# Patient Record
Sex: Female | Born: 1945
Health system: Southern US, Community
[De-identification: ages and names within clinical notes are randomized; demographics above are authoritative.]

## PROBLEM LIST (undated history)

## (undated) DIAGNOSIS — J189 Pneumonia, unspecified organism: Secondary | ICD-10-CM

## (undated) DIAGNOSIS — M1990 Unspecified osteoarthritis, unspecified site: Secondary | ICD-10-CM

## (undated) DIAGNOSIS — F419 Anxiety disorder, unspecified: Secondary | ICD-10-CM

## (undated) DIAGNOSIS — I10 Essential (primary) hypertension: Secondary | ICD-10-CM

## (undated) DIAGNOSIS — S72002A Fracture of unspecified part of neck of left femur, initial encounter for closed fracture: Secondary | ICD-10-CM

## (undated) DIAGNOSIS — I2510 Atherosclerotic heart disease of native coronary artery without angina pectoris: Secondary | ICD-10-CM

## (undated) DIAGNOSIS — E785 Hyperlipidemia, unspecified: Secondary | ICD-10-CM

## (undated) HISTORY — PX: KNEE SURGERY: SHX244

## (undated) HISTORY — DX: Hyperlipidemia, unspecified: E78.5

## (undated) HISTORY — PX: CHOLECYSTECTOMY: SHX55

## (undated) HISTORY — PX: CARDIAC CATHETERIZATION: SHX172

## (undated) HISTORY — PX: EYE SURGERY: SHX253

## (undated) HISTORY — PX: CATARACT EXTRACTION, BILATERAL: SHX1313

## (undated) HISTORY — DX: Essential (primary) hypertension: I10

## (undated) HISTORY — PX: APPENDECTOMY: SHX54

---

## 2018-08-15 ENCOUNTER — Other Ambulatory Visit: Payer: Self-pay

## 2018-08-15 ENCOUNTER — Encounter (HOSPITAL_COMMUNITY)
Admission: EM | Disposition: A | Discharge status: Home Health | Payer: Self-pay | Source: Home / Self Care | Attending: Internal Medicine

## 2018-08-15 ENCOUNTER — Encounter (HOSPITAL_COMMUNITY): Payer: Self-pay | Admitting: Internal Medicine

## 2018-08-15 ENCOUNTER — Inpatient Hospital Stay (HOSPITAL_COMMUNITY): Payer: Medicare Other

## 2018-08-15 ENCOUNTER — Inpatient Hospital Stay (HOSPITAL_COMMUNITY): Payer: Medicare Other | Admitting: Anesthesiology

## 2018-08-15 ENCOUNTER — Emergency Department (HOSPITAL_COMMUNITY): Payer: Medicare Other

## 2018-08-15 ENCOUNTER — Other Ambulatory Visit (INDEPENDENT_AMBULATORY_CARE_PROVIDER_SITE_OTHER): Payer: Self-pay | Admitting: Physician Assistant

## 2018-08-15 ENCOUNTER — Inpatient Hospital Stay (HOSPITAL_COMMUNITY)
Admission: EM | Admit: 2018-08-15 | Discharge: 2018-08-17 | DRG: 470 | Disposition: A | Discharge status: Home Health | Payer: Medicare Other | Attending: Internal Medicine | Admitting: Internal Medicine

## 2018-08-15 DIAGNOSIS — E669 Obesity, unspecified: Secondary | ICD-10-CM | POA: Diagnosis present

## 2018-08-15 DIAGNOSIS — M25552 Pain in left hip: Secondary | ICD-10-CM | POA: Diagnosis present

## 2018-08-15 DIAGNOSIS — S72002A Fracture of unspecified part of neck of left femur, initial encounter for closed fracture: Secondary | ICD-10-CM

## 2018-08-15 DIAGNOSIS — Z9841 Cataract extraction status, right eye: Secondary | ICD-10-CM | POA: Diagnosis not present

## 2018-08-15 DIAGNOSIS — R030 Elevated blood-pressure reading, without diagnosis of hypertension: Secondary | ICD-10-CM | POA: Diagnosis not present

## 2018-08-15 DIAGNOSIS — S72042A Displaced fracture of base of neck of left femur, initial encounter for closed fracture: Secondary | ICD-10-CM

## 2018-08-15 DIAGNOSIS — F419 Anxiety disorder, unspecified: Secondary | ICD-10-CM | POA: Diagnosis present

## 2018-08-15 DIAGNOSIS — Z8249 Family history of ischemic heart disease and other diseases of the circulatory system: Secondary | ICD-10-CM | POA: Diagnosis not present

## 2018-08-15 DIAGNOSIS — W101XXA Fall (on)(from) sidewalk curb, initial encounter: Secondary | ICD-10-CM | POA: Diagnosis present

## 2018-08-15 DIAGNOSIS — Z87891 Personal history of nicotine dependence: Secondary | ICD-10-CM | POA: Diagnosis not present

## 2018-08-15 DIAGNOSIS — Z9842 Cataract extraction status, left eye: Secondary | ICD-10-CM | POA: Diagnosis not present

## 2018-08-15 DIAGNOSIS — I10 Essential (primary) hypertension: Secondary | ICD-10-CM | POA: Diagnosis not present

## 2018-08-15 DIAGNOSIS — Z96642 Presence of left artificial hip joint: Secondary | ICD-10-CM

## 2018-08-15 DIAGNOSIS — I2510 Atherosclerotic heart disease of native coronary artery without angina pectoris: Secondary | ICD-10-CM | POA: Diagnosis not present

## 2018-08-15 DIAGNOSIS — Z6833 Body mass index (BMI) 33.0-33.9, adult: Secondary | ICD-10-CM | POA: Diagnosis not present

## 2018-08-15 DIAGNOSIS — S72009A Fracture of unspecified part of neck of unspecified femur, initial encounter for closed fracture: Secondary | ICD-10-CM | POA: Diagnosis present

## 2018-08-15 DIAGNOSIS — Z419 Encounter for procedure for purposes other than remedying health state, unspecified: Secondary | ICD-10-CM

## 2018-08-15 HISTORY — DX: Fracture of unspecified part of neck of left femur, initial encounter for closed fracture: S72.002A

## 2018-08-15 HISTORY — PX: TOTAL HIP ARTHROPLASTY: SHX124

## 2018-08-15 LAB — TYPE AND SCREEN
ABO/RH(D): O POS
ANTIBODY SCREEN: NEGATIVE

## 2018-08-15 LAB — LIPID PANEL
Cholesterol: 221 mg/dL — ABNORMAL HIGH (ref 0–200)
HDL: 55 mg/dL (ref >40)
LDL Cholesterol: 154 mg/dL — ABNORMAL HIGH (ref 0–99)
Total CHOL/HDL Ratio: 4 RATIO
Triglycerides: 59 mg/dL (ref <150)
VLDL: 12 mg/dL (ref 0–40)

## 2018-08-15 LAB — CBC
HCT: 42 % (ref 36.0–46.0)
Hemoglobin: 13.6 g/dL (ref 12.0–15.0)
MCH: 30 pg (ref 26.0–34.0)
MCHC: 32.4 g/dL (ref 30.0–36.0)
MCV: 92.5 fL (ref 80.0–100.0)
Platelets: 249 10*3/uL (ref 150–400)
RBC: 4.54 MIL/uL (ref 3.87–5.11)
RDW: 13.2 % (ref 11.5–15.5)
WBC: 15.2 10*3/uL — AB (ref 4.0–10.5)
nRBC: 0 % (ref 0.0–0.2)

## 2018-08-15 LAB — SURGICAL PCR SCREEN
MRSA, PCR: NEGATIVE
Staphylococcus aureus: NEGATIVE

## 2018-08-15 LAB — CBC WITH DIFFERENTIAL/PLATELET
Abs Immature Granulocytes: 0.08 10*3/uL — ABNORMAL HIGH (ref 0.00–0.07)
Basophils Absolute: 0.1 10*3/uL (ref 0.0–0.1)
Basophils Relative: 1 %
Eosinophils Absolute: 0.1 10*3/uL (ref 0.0–0.5)
Eosinophils Relative: 1 %
HCT: 43 % (ref 36.0–46.0)
Hemoglobin: 14.1 g/dL (ref 12.0–15.0)
Immature Granulocytes: 1 %
Lymphocytes Relative: 15 %
Lymphs Abs: 1.6 10*3/uL (ref 0.7–4.0)
MCH: 30.5 pg (ref 26.0–34.0)
MCHC: 32.8 g/dL (ref 30.0–36.0)
MCV: 92.9 fL (ref 80.0–100.0)
Monocytes Absolute: 0.6 10*3/uL (ref 0.1–1.0)
Monocytes Relative: 6 %
NEUTROS PCT: 76 %
Neutro Abs: 8.3 10*3/uL — ABNORMAL HIGH (ref 1.7–7.7)
Platelets: 247 10*3/uL (ref 150–400)
RBC: 4.63 MIL/uL (ref 3.87–5.11)
RDW: 13.2 % (ref 11.5–15.5)
WBC: 10.7 10*3/uL — ABNORMAL HIGH (ref 4.0–10.5)
nRBC: 0 % (ref 0.0–0.2)

## 2018-08-15 LAB — BASIC METABOLIC PANEL
Anion gap: 10 (ref 5–15)
BUN: 22 mg/dL (ref 8–23)
CO2: 26 mmol/L (ref 22–32)
Calcium: 9.2 mg/dL (ref 8.9–10.3)
Chloride: 104 mmol/L (ref 98–111)
Creatinine, Ser: 0.79 mg/dL (ref 0.44–1.00)
GFR calc Af Amer: >60 mL/min (ref >60)
GFR calc non Af Amer: >60 mL/min (ref >60)
Glucose, Bld: 114 mg/dL — ABNORMAL HIGH (ref 70–99)
Potassium: 4.1 mmol/L (ref 3.5–5.1)
Sodium: 140 mmol/L (ref 135–145)

## 2018-08-15 LAB — GLUCOSE, CAPILLARY: Glucose-Capillary: 124 mg/dL — ABNORMAL HIGH (ref 70–99)

## 2018-08-15 LAB — HEMOGLOBIN A1C
Hgb A1c MFr Bld: 5.1 % (ref 4.8–5.6)
MEAN PLASMA GLUCOSE: 99.67 mg/dL

## 2018-08-15 LAB — TSH: TSH: 0.472 u[IU]/mL (ref 0.350–4.500)

## 2018-08-15 LAB — CREATININE, SERUM
Creatinine, Ser: 0.91 mg/dL (ref 0.44–1.00)
GFR calc non Af Amer: >60 mL/min (ref >60)

## 2018-08-15 SURGERY — ARTHROPLASTY, HIP, TOTAL, ANTERIOR APPROACH
Anesthesia: General | Site: Hip | Laterality: Left

## 2018-08-15 MED ORDER — PROPOFOL 10 MG/ML IV BOLUS
INTRAVENOUS | Status: AC
  Filled 2018-08-15: qty 20

## 2018-08-15 MED ORDER — HYDROCODONE-ACETAMINOPHEN 7.5-325 MG PO TABS: 1.0000 | ORAL_TABLET | ORAL | Status: DC | PRN

## 2018-08-15 MED ORDER — ONDANSETRON HCL 4 MG/2ML IJ SOLN: 4.0000 mg | Freq: Four times a day (QID) | INTRAMUSCULAR | Status: DC | PRN

## 2018-08-15 MED ORDER — KETAMINE HCL 10 MG/ML IJ SOLN
INTRAMUSCULAR | Status: AC
  Filled 2018-08-15: qty 1

## 2018-08-15 MED ORDER — BUPIVACAINE IN DEXTROSE 0.75-8.25 % IT SOLN
INTRATHECAL | Status: DC | PRN
  Administered 2018-08-15: 1.6 mL via INTRATHECAL

## 2018-08-15 MED ORDER — METHOCARBAMOL 500 MG IVPB - SIMPLE MED
500.0000 mg | Freq: Four times a day (QID) | INTRAVENOUS | Status: DC | PRN
  Filled 2018-08-15: qty 50

## 2018-08-15 MED ORDER — PHENOL 1.4 % MT LIQD: 1.0000 | OROMUCOSAL | Status: DC | PRN

## 2018-08-15 MED ORDER — SODIUM CHLORIDE 0.9 % IR SOLN
Status: DC | PRN
  Administered 2018-08-15: 1000 mL

## 2018-08-15 MED ORDER — MORPHINE SULFATE (PF) 4 MG/ML IV SOLN: 0.5000 mg | INTRAVENOUS | Status: DC | PRN

## 2018-08-15 MED ORDER — SODIUM CHLORIDE 0.9 % IV SOLN
INTRAVENOUS | Status: DC
  Administered 2018-08-15 (×2): via INTRAVENOUS

## 2018-08-15 MED ORDER — DEXAMETHASONE SODIUM PHOSPHATE 10 MG/ML IJ SOLN
INTRAMUSCULAR | Status: DC | PRN
  Administered 2018-08-15: 10 mg via INTRAVENOUS

## 2018-08-15 MED ORDER — ONDANSETRON HCL 4 MG/2ML IJ SOLN
4.0000 mg | Freq: Four times a day (QID) | INTRAMUSCULAR | Status: DC | PRN
  Administered 2018-08-15: 4 mg via INTRAVENOUS
  Filled 2018-08-15: qty 2

## 2018-08-15 MED ORDER — SUGAMMADEX SODIUM 200 MG/2ML IV SOLN
INTRAVENOUS | Status: AC
  Filled 2018-08-15: qty 2

## 2018-08-15 MED ORDER — DEXAMETHASONE SODIUM PHOSPHATE 10 MG/ML IJ SOLN
INTRAMUSCULAR | Status: AC
  Filled 2018-08-15: qty 1

## 2018-08-15 MED ORDER — CHLORHEXIDINE GLUCONATE 4 % EX LIQD: 60.0000 mL | Freq: Once | CUTANEOUS | Status: DC

## 2018-08-15 MED ORDER — ONDANSETRON HCL 4 MG/2ML IJ SOLN
INTRAMUSCULAR | Status: AC
  Filled 2018-08-15: qty 2

## 2018-08-15 MED ORDER — DOCUSATE SODIUM 100 MG PO CAPS
100.0000 mg | ORAL_CAPSULE | Freq: Two times a day (BID) | ORAL | Status: DC
  Administered 2018-08-16 – 2018-08-17 (×3): 100 mg via ORAL
  Filled 2018-08-15 (×3): qty 1

## 2018-08-15 MED ORDER — OXYCODONE HCL 5 MG PO TABS: 5.0000 mg | ORAL_TABLET | Freq: Once | ORAL | Status: DC | PRN

## 2018-08-15 MED ORDER — METOCLOPRAMIDE HCL 5 MG/ML IJ SOLN: 5.0000 mg | Freq: Three times a day (TID) | INTRAMUSCULAR | Status: DC | PRN

## 2018-08-15 MED ORDER — MORPHINE SULFATE (PF) 4 MG/ML IV SOLN
4.0000 mg | Freq: Once | INTRAVENOUS | Status: AC
  Administered 2018-08-15: 4 mg via INTRAVENOUS
  Filled 2018-08-15: qty 1

## 2018-08-15 MED ORDER — NITROGLYCERIN 2 % TD OINT
0.5000 [in_us] | TOPICAL_OINTMENT | Freq: Once | TRANSDERMAL | Status: AC
  Administered 2018-08-15: 0.5 [in_us] via TOPICAL
  Filled 2018-08-15: qty 1

## 2018-08-15 MED ORDER — TRANEXAMIC ACID-NACL 1000-0.7 MG/100ML-% IV SOLN
1000.0000 mg | INTRAVENOUS | Status: AC
  Administered 2018-08-15: 1000 mg via INTRAVENOUS
  Filled 2018-08-15: qty 100

## 2018-08-15 MED ORDER — ONDANSETRON HCL 4 MG/2ML IJ SOLN: 4.0000 mg | Freq: Once | INTRAMUSCULAR | Status: DC | PRN

## 2018-08-15 MED ORDER — PANTOPRAZOLE SODIUM 40 MG PO TBEC
40.0000 mg | DELAYED_RELEASE_TABLET | Freq: Every day | ORAL | Status: DC
  Administered 2018-08-15 – 2018-08-17 (×3): 40 mg via ORAL
  Filled 2018-08-15 (×3): qty 1

## 2018-08-15 MED ORDER — EPHEDRINE 5 MG/ML INJ
INTRAVENOUS | Status: AC
  Filled 2018-08-15: qty 10

## 2018-08-15 MED ORDER — MIDAZOLAM HCL 2 MG/2ML IJ SOLN
INTRAMUSCULAR | Status: DC | PRN
  Administered 2018-08-15: 1 mg via INTRAVENOUS

## 2018-08-15 MED ORDER — HYDRALAZINE HCL 20 MG/ML IJ SOLN
10.0000 mg | Freq: Four times a day (QID) | INTRAMUSCULAR | Status: DC | PRN
  Administered 2018-08-15: 10 mg via INTRAVENOUS
  Filled 2018-08-15: qty 1

## 2018-08-15 MED ORDER — DOCUSATE SODIUM 100 MG PO CAPS: 100.0000 mg | ORAL_CAPSULE | Freq: Two times a day (BID) | ORAL | Status: DC

## 2018-08-15 MED ORDER — MENTHOL 3 MG MT LOZG: 1.0000 | LOZENGE | OROMUCOSAL | Status: DC | PRN

## 2018-08-15 MED ORDER — KETAMINE HCL 10 MG/ML IJ SOLN
INTRAMUSCULAR | Status: DC | PRN
  Administered 2018-08-15: 20 mg via INTRAVENOUS
  Administered 2018-08-15: 30 mg via INTRAVENOUS
  Administered 2018-08-15: 20 mg via INTRAVENOUS

## 2018-08-15 MED ORDER — METHOCARBAMOL 500 MG PO TABS
500.0000 mg | ORAL_TABLET | Freq: Four times a day (QID) | ORAL | Status: DC | PRN
  Administered 2018-08-16 – 2018-08-17 (×3): 500 mg via ORAL
  Filled 2018-08-15 (×3): qty 1

## 2018-08-15 MED ORDER — CEFAZOLIN SODIUM-DEXTROSE 2-4 GM/100ML-% IV SOLN
2.0000 g | INTRAVENOUS | Status: AC
  Administered 2018-08-15: 2 g via INTRAVENOUS
  Filled 2018-08-15: qty 100

## 2018-08-15 MED ORDER — FENTANYL CITRATE (PF) 250 MCG/5ML IJ SOLN
INTRAMUSCULAR | Status: AC
  Filled 2018-08-15: qty 5

## 2018-08-15 MED ORDER — PROPOFOL 500 MG/50ML IV EMUL
INTRAVENOUS | Status: DC | PRN
  Administered 2018-08-15: 75 ug/kg/min via INTRAVENOUS

## 2018-08-15 MED ORDER — SODIUM CHLORIDE 0.9 % IV SOLN
INTRAVENOUS | Status: DC
  Administered 2018-08-15 – 2018-08-16 (×2): via INTRAVENOUS

## 2018-08-15 MED ORDER — POVIDONE-IODINE 10 % EX SWAB
2.0000 "application " | Freq: Once | CUTANEOUS | Status: AC
  Administered 2018-08-15: 2 via TOPICAL

## 2018-08-15 MED ORDER — PHENYLEPHRINE 40 MCG/ML (10ML) SYRINGE FOR IV PUSH (FOR BLOOD PRESSURE SUPPORT)
PREFILLED_SYRINGE | INTRAVENOUS | Status: AC
  Filled 2018-08-15: qty 10

## 2018-08-15 MED ORDER — FENTANYL CITRATE (PF) 250 MCG/5ML IJ SOLN
INTRAMUSCULAR | Status: DC | PRN
  Administered 2018-08-15 (×3): 50 ug via INTRAVENOUS

## 2018-08-15 MED ORDER — ROCURONIUM BROMIDE 100 MG/10ML IV SOLN
INTRAVENOUS | Status: AC
  Filled 2018-08-15: qty 1

## 2018-08-15 MED ORDER — ALBUTEROL SULFATE HFA 108 (90 BASE) MCG/ACT IN AERS
INHALATION_SPRAY | RESPIRATORY_TRACT | Status: AC
  Filled 2018-08-15: qty 6.7

## 2018-08-15 MED ORDER — FENTANYL CITRATE (PF) 100 MCG/2ML IJ SOLN: 25.0000 ug | INTRAMUSCULAR | Status: DC | PRN

## 2018-08-15 MED ORDER — ONDANSETRON HCL 4 MG PO TABS: 4.0000 mg | ORAL_TABLET | Freq: Four times a day (QID) | ORAL | Status: DC | PRN

## 2018-08-15 MED ORDER — PHENYLEPHRINE 40 MCG/ML (10ML) SYRINGE FOR IV PUSH (FOR BLOOD PRESSURE SUPPORT)
PREFILLED_SYRINGE | INTRAVENOUS | Status: DC | PRN
  Administered 2018-08-15 (×4): 80 ug via INTRAVENOUS

## 2018-08-15 MED ORDER — ACETAMINOPHEN 325 MG PO TABS
650.0000 mg | ORAL_TABLET | Freq: Four times a day (QID) | ORAL | Status: DC | PRN
  Administered 2018-08-16 – 2018-08-17 (×3): 650 mg via ORAL
  Filled 2018-08-15 (×3): qty 2

## 2018-08-15 MED ORDER — DIPHENHYDRAMINE HCL 12.5 MG/5ML PO ELIX
12.5000 mg | ORAL_SOLUTION | ORAL | Status: DC | PRN
  Administered 2018-08-16: 25 mg via ORAL
  Filled 2018-08-15: qty 10

## 2018-08-15 MED ORDER — OXYCODONE HCL 5 MG/5ML PO SOLN
5.0000 mg | Freq: Once | ORAL | Status: DC | PRN
  Filled 2018-08-15: qty 5

## 2018-08-15 MED ORDER — CEFAZOLIN SODIUM-DEXTROSE 1-4 GM/50ML-% IV SOLN
1.0000 g | Freq: Four times a day (QID) | INTRAVENOUS | Status: AC
  Administered 2018-08-15 – 2018-08-16 (×2): 1 g via INTRAVENOUS
  Filled 2018-08-15 (×2): qty 50

## 2018-08-15 MED ORDER — HYDRALAZINE HCL 20 MG/ML IJ SOLN: 10.0000 mg | Freq: Four times a day (QID) | INTRAMUSCULAR | Status: DC | PRN

## 2018-08-15 MED ORDER — ACETAMINOPHEN 325 MG PO TABS: 325.0000 mg | ORAL_TABLET | Freq: Four times a day (QID) | ORAL | Status: DC | PRN

## 2018-08-15 MED ORDER — STERILE WATER FOR IRRIGATION IR SOLN
Status: DC | PRN
  Administered 2018-08-15: 2000 mL

## 2018-08-15 MED ORDER — ALUM & MAG HYDROXIDE-SIMETH 200-200-20 MG/5ML PO SUSP: 30.0000 mL | ORAL | Status: DC | PRN

## 2018-08-15 MED ORDER — LIDOCAINE 2% (20 MG/ML) 5 ML SYRINGE
INTRAMUSCULAR | Status: AC
  Filled 2018-08-15: qty 5

## 2018-08-15 MED ORDER — ACETAMINOPHEN 650 MG RE SUPP: 650.0000 mg | Freq: Four times a day (QID) | RECTAL | Status: DC | PRN

## 2018-08-15 MED ORDER — METOCLOPRAMIDE HCL 5 MG PO TABS: 5.0000 mg | ORAL_TABLET | Freq: Three times a day (TID) | ORAL | Status: DC | PRN

## 2018-08-15 MED ORDER — ONDANSETRON HCL 4 MG/2ML IJ SOLN
4.0000 mg | Freq: Once | INTRAMUSCULAR | Status: AC
  Administered 2018-08-15: 4 mg via INTRAVENOUS
  Filled 2018-08-15: qty 2

## 2018-08-15 MED ORDER — POLYETHYLENE GLYCOL 3350 17 G PO PACK: 17.0000 g | PACK | Freq: Every day | ORAL | Status: DC | PRN

## 2018-08-15 MED ORDER — ONDANSETRON HCL 4 MG/2ML IJ SOLN
INTRAMUSCULAR | Status: DC | PRN
  Administered 2018-08-15: 4 mg via INTRAVENOUS

## 2018-08-15 MED ORDER — SODIUM CHLORIDE 0.9 % IV SOLN: INTRAVENOUS | Status: DC

## 2018-08-15 MED ORDER — EPHEDRINE SULFATE-NACL 50-0.9 MG/10ML-% IV SOSY
PREFILLED_SYRINGE | INTRAVENOUS | Status: DC | PRN
  Administered 2018-08-15: 10 mg via INTRAVENOUS

## 2018-08-15 MED ORDER — HYDROCODONE-ACETAMINOPHEN 5-325 MG PO TABS
1.0000 | ORAL_TABLET | ORAL | Status: DC | PRN
  Administered 2018-08-15 – 2018-08-16 (×2): 2 via ORAL
  Administered 2018-08-16: 1 via ORAL
  Filled 2018-08-15: qty 2
  Filled 2018-08-15: qty 1
  Filled 2018-08-15: qty 2

## 2018-08-15 MED ORDER — ASPIRIN 81 MG PO CHEW
81.0000 mg | CHEWABLE_TABLET | Freq: Two times a day (BID) | ORAL | Status: DC
  Administered 2018-08-15 – 2018-08-17 (×4): 81 mg via ORAL
  Filled 2018-08-15 (×4): qty 1

## 2018-08-15 MED ORDER — MIDAZOLAM HCL 2 MG/2ML IJ SOLN
INTRAMUSCULAR | Status: AC
  Filled 2018-08-15: qty 2

## 2018-08-15 MED ORDER — LIP MEDEX EX OINT
TOPICAL_OINTMENT | CUTANEOUS | Status: AC
  Administered 2018-08-15: 15:00:00
  Filled 2018-08-15: qty 7

## 2018-08-15 SURGICAL SUPPLY — 42 items
BAG ZIPLOCK 12X15 (MISCELLANEOUS) IMPLANT
BLADE SAW SGTL 18X1.27X75 (BLADE) ×2 IMPLANT
BLADE SURG SZ10 CARB STEEL (BLADE) ×4 IMPLANT
COVER PERINEAL POST (MISCELLANEOUS) ×2 IMPLANT
COVER SURGICAL LIGHT HANDLE (MISCELLANEOUS) ×2 IMPLANT
COVER WAND RF STERILE (DRAPES) IMPLANT
DRAPE STERI IOBAN 125X83 (DRAPES) ×2 IMPLANT
DRAPE U-SHAPE 47X51 STRL (DRAPES) ×4 IMPLANT
DRSG AQUACEL AG ADV 3.5X10 (GAUZE/BANDAGES/DRESSINGS) ×2 IMPLANT
DURAPREP 26ML APPLICATOR (WOUND CARE) ×2 IMPLANT
ELECT REM PT RETURN 15FT ADLT (MISCELLANEOUS) ×2 IMPLANT
GAUZE XEROFORM 1X8 LF (GAUZE/BANDAGES/DRESSINGS) ×1 IMPLANT
GLOVE BIO SURGEON STRL SZ7.5 (GLOVE) ×2 IMPLANT
GLOVE BIOGEL PI IND STRL 7.0 (GLOVE) IMPLANT
GLOVE BIOGEL PI IND STRL 7.5 (GLOVE) IMPLANT
GLOVE BIOGEL PI IND STRL 8 (GLOVE) ×2 IMPLANT
GLOVE BIOGEL PI IND STRL 9 (GLOVE) IMPLANT
GLOVE BIOGEL PI INDICATOR 7.0 (GLOVE) ×2
GLOVE BIOGEL PI INDICATOR 7.5 (GLOVE) ×1
GLOVE BIOGEL PI INDICATOR 8 (GLOVE) ×2
GLOVE BIOGEL PI INDICATOR 9 (GLOVE) ×1
GLOVE ECLIPSE 8.0 STRL XLNG CF (GLOVE) ×3 IMPLANT
GLOVE SURG ORTHO 9.0 STRL STRW (GLOVE) ×1 IMPLANT
GOWN SPEC L3 XXLG W/TWL (GOWN DISPOSABLE) ×1 IMPLANT
GOWN STRL REUS W/TWL XL LVL3 (GOWN DISPOSABLE) ×5 IMPLANT
HANDPIECE INTERPULSE COAX TIP (DISPOSABLE) ×1
HEAD M SROM 36MM 2 (Hips) IMPLANT
HOLDER FOLEY CATH W/STRAP (MISCELLANEOUS) ×2 IMPLANT
LINER ACETAB NEUTRAL 36ID 520D (Liner) ×1 IMPLANT
PACK ANTERIOR HIP CUSTOM (KITS) ×2 IMPLANT
PIN SECTOR W/GRIP ACE CUP 52MM (Hips) ×1 IMPLANT
SET HNDPC FAN SPRY TIP SCT (DISPOSABLE) ×1 IMPLANT
SROM M HEAD 36MM 2 (Hips) ×2 IMPLANT
STAPLER VISISTAT 35W (STAPLE) ×1 IMPLANT
STEM CORAIL KA12 (Stem) ×1 IMPLANT
SUT ETHIBOND NAB CT1 #1 30IN (SUTURE) ×2 IMPLANT
SUT VIC AB 0 CT1 36 (SUTURE) ×2 IMPLANT
SUT VIC AB 1 CT1 36 (SUTURE) ×2 IMPLANT
SUT VIC AB 2-0 CT1 27 (SUTURE) ×2
SUT VIC AB 2-0 CT1 TAPERPNT 27 (SUTURE) ×2 IMPLANT
TRAY FOLEY MTR SLVR 14FR STAT (SET/KITS/TRAYS/PACK) ×1 IMPLANT
YANKAUER SUCT BULB TIP 10FT TU (MISCELLANEOUS) ×2 IMPLANT

## 2018-08-15 NOTE — Anesthesia Procedure Notes (Signed)
Spinal  Patient location during procedure: OR Start time: 08/15/2018 4:00 PM End time: 08/15/2018 4:08 PM Staffing Anesthesiologist: Beryle LatheBrock, Thomas E, MD Performed: anesthesiologist  Preanesthetic Checklist Completed: patient identified, surgical consent, pre-op evaluation, timeout performed, IV checked, risks and benefits discussed and monitors and equipment checked Spinal Block Patient position: left lateral decubitus Prep: DuraPrep Patient monitoring: heart rate, cardiac monitor, continuous pulse ox and blood pressure Approach: midline Location: L3-4 Injection technique: single-shot Needle Needle type: Whitacre  Needle gauge: 22 G Additional Notes Consent was obtained prior to the procedure with all questions answered and concerns addressed. Risks including, but not limited to, bleeding, infection, nerve damage, paralysis, failed block, inadequate analgesia, allergic reaction, high spinal, itching, and headache were discussed and the patient wished to proceed. Functioning IV was confirmed and monitors were applied. Sterile prep and drape, including hand hygiene, mask, and sterile gloves were used. The patient was positioned and the spine was prepped. The skin was anesthetized with lidocaine. Free flow of clear CSF was obtained prior to injecting local anesthetic into the CSF. The spinal needle aspirated freely following injection. The needle was carefully withdrawn. Patient remained in left lateral decubitus position for 3 minutes following the block to encourage unilateral block of the affected hip. The patient tolerated the procedure well.   Leslye Peerhomas Brock, MD

## 2018-08-15 NOTE — Consult Note (Signed)
Reason for Consult:  Left hip femoral neck fracture Referring Physician:   Wonda Cardenas ED EDP  BENA QUIRAM is an 73 y.o. female.  HPI:   73 yo female with no past history of hip patient who sustained an accidental mechanical fall when she tripped over a curb today.  She was brought to the ED with significant left hip pain and the inability to ambulate.  She was found to have a left hip fracture.  Ortho is consulted for this.  She complains only of left hip pain.  She denies any syncopal episode, CP, SOB, F/C, N/V.  Past Medical History:  Diagnosis Date  . Closed fracture of neck of left femur (HCC) 08/15/2018    Past Surgical History:  Procedure Laterality Date  . CATARACT EXTRACTION, BILATERAL    . KNEE SURGERY      Family History  Problem Relation Age of Onset  . Cancer Sister   . Heart attack Brother     Social History:  reports that she has quit smoking. She has never used smokeless tobacco. She reports previous alcohol use. She reports that she does not use drugs.  Allergies: No Known Allergies  Medications: I have reviewed the patient's current medications.  Results for orders placed or performed during the hospital encounter of 08/15/18 (from the past 48 hour(s))  Basic metabolic panel     Status: Abnormal   Collection Time: 08/15/18 12:49 PM  Result Value Ref Range   Sodium 140 135 - 145 mmol/L   Potassium 4.1 3.5 - 5.1 mmol/L   Chloride 104 98 - 111 mmol/L   CO2 26 22 - 32 mmol/L   Glucose, Bld 114 (H) 70 - 99 mg/dL   BUN 22 8 - 23 mg/dL   Creatinine, Ser 4.25 0.44 - 1.00 mg/dL   Calcium 9.2 8.9 - 95.6 mg/dL   GFR calc non Af Amer >60 >60 mL/min   GFR calc Af Amer >60 >60 mL/min   Anion gap 10 5 - 15    Comment: Performed at [facility], [address] [place], [place] [address]  CBC with Differential     Status: Abnormal   Collection Time: 08/15/18 12:49 PM  Result Value Ref Range   WBC 10.7 (H) 4.0 - 10.5 K/uL   RBC 4.63 3.87 - 5.11  MIL/uL   Hemoglobin 14.1 12.0 - 15.0 g/dL   HCT 43.3 29.5 - 18.8 %   MCV 92.9 80.0 - 100.0 fL   MCH 30.5 26.0 - 34.0 pg   MCHC 32.8 30.0 - 36.0 g/dL   RDW 41.6 60.6 - 30.1 %   Platelets 247 150 - 400 K/uL   nRBC 0.0 0.0 - 0.2 %   Neutrophils Relative % 76 %   Neutro Abs 8.3 (H) 1.7 - 7.7 K/uL   Lymphocytes Relative 15 %   Lymphs Abs 1.6 0.7 - 4.0 K/uL   Monocytes Relative 6 %   Monocytes Absolute 0.6 0.1 - 1.0 K/uL   Eosinophils Relative 1 %   Eosinophils Absolute 0.1 0.0 - 0.5 K/uL   Basophils Relative 1 %   Basophils Absolute 0.1 0.0 - 0.1 K/uL   Immature Granulocytes 1 %   Abs Immature Granulocytes 0.08 (H) 0.00 - 0.07 K/uL    Comment: Performed at [facility], [address], [place], [place] [address]    Dg Hip Unilat With Pelvis 2-3 Views Left  Result Date: 08/15/2018 CLINICAL DATA:  Left hip pain after  fall today. EXAM: DG HIP (WITH OR WITHOUT PELVIS) 2-3V LEFT COMPARISON:  None. FINDINGS: Mildly displaced fracture is seen involving the proximal left femoral neck. Right hip appears normal. No significant degenerative changes noted. IMPRESSION: Mildly displaced proximal left femoral neck fracture. Electronically Signed   By: Lupita RaiderJames  Green Jr, M.D.   On: 08/15/2018 12:06    ROS Blood pressure 133/63, pulse 75, resp. rate 16, height 5\' 5"  (1.651 m), weight 81.6 kg, SpO2 98 %. Physical Exam  Assessment/Plan: Left hip femoral neck fracture  I have spoke to the patient and her husband in detail.  We have recommended a left hip replacement to treat this injury.  The reasoning behind surgery is discussed as well as the risks and benefits involved.  Informed consent is obtained.  Kathryne HitchChristopher Y Perl Folmar 08/15/2018, 3:46 PM

## 2018-08-15 NOTE — Anesthesia Procedure Notes (Signed)
Date/Time: 08/15/2018 3:10 PM Performed by: Florene Route, CRNA Oxygen Delivery Method: Nasal cannula

## 2018-08-15 NOTE — ED Provider Notes (Signed)
[facility]-EMERGENCY DEPT Provider Note   CSN: [id number] Arrival date & time: 08/15/18  1122     History   Chief Complaint Chief Complaint  Patient presents with  . Fall    HPI Debbie Cardenas is a 73 y.o. female.  The history is provided by the patient. No language interpreter was used.  Fall    Debbie Cardenas is a 73 y.o. female who presents to the Emergency Department complaining of fall.  She presents to the ED for evaluation of injuries following a fall that happened about 11 am.  She was walking to get in her car and tripped over a curb, falling and landing on her left hip.  No head injury or LOC.  She is unable to bear weight.  She denies any recent illnesses, takes no medications and has no known medical problems.  She has not been to a doctor in over 40 years.  She has severe pain to the left hip, no additional complaints.   Past Medical History:  Diagnosis Date  . Closed fracture of neck of left femur (HCC) 08/15/2018    Patient Active Problem List   Diagnosis Date Noted  . Hip fracture (HCC) 08/15/2018  . Closed fracture of neck of left femur (HCC) 08/15/2018       OB History   No obstetric history on file.      Home Medications    Prior to Admission medications   Medication Sig Start Date End Date Taking? Authorizing Provider  acetaminophen (TYLENOL) 325 MG tablet Take 650 mg by mouth every 6 (six) hours as needed for mild pain or headache.   Yes [provider]  OVER THE COUNTER MEDICATION Take 1 tablet by mouth daily. OTC med to help with bowel movements. Says its not a stool softer or probiotic.   Yes [provider]    Family History Family History  Problem Relation Age of Onset  . Cancer Sister   . Heart attack Brother     Social History Social History   Tobacco Use  . Smoking status: Former Games developer  . Smokeless tobacco: Never Used  Substance Use Topics  . Alcohol use: Not Currently  . Drug  use: Never     Allergies   Patient has no known allergies.   Review of Systems Review of Systems  All other systems reviewed and are negative.    Physical Exam Updated Vital Signs BP 133/63   Pulse 75   Resp 16   Ht 5\' 5"  (1.651 m)   Wt 81.6 kg Comment: newly diagnosed broken hip on ER stretcher  SpO2 98%   BMI 29.95 kg/m   Physical Exam Vitals signs and nursing note reviewed.  Constitutional:      Appearance: She is well-developed.  HENT:     Head: Normocephalic and atraumatic.  Cardiovascular:     Rate and Rhythm: Normal rate and regular rhythm.     Heart sounds: No murmur.  Pulmonary:     Effort: Pulmonary effort is normal. No respiratory distress.     Breath sounds: Normal breath sounds.  Abdominal:     Palpations: Abdomen is soft.     Tenderness: There is no abdominal tenderness. There is no guarding or rebound.  Musculoskeletal:        General: No swelling.     Comments: 2+ DP pulses bilaterally. TTP over left lateral hip.  LLE is externally rotated and shortened.    Skin:  General: Skin is warm and dry.  Neurological:     Mental Status: She is alert and oriented to person, place, and time.  Psychiatric:        Mood and Affect: Mood normal.        Behavior: Behavior normal.      ED Treatments / Results  Labs (all labs ordered are listed, but only abnormal results are displayed) Labs Reviewed  BASIC METABOLIC PANEL - Abnormal; Notable for the following components:      Result Value   Glucose, Bld 114 (*)    All other components within normal limits  CBC WITH DIFFERENTIAL/PLATELET - Abnormal; Notable for the following components:   WBC 10.7 (*)    Neutro Abs 8.3 (*)    Abs Immature Granulocytes 0.08 (*)    All other components within normal limits  SURGICAL PCR SCREEN  HEMOGLOBIN A1C  TSH  LIPID PANEL  TYPE AND SCREEN    EKG None  Radiology Dg Hip Unilat With Pelvis 2-3 Views Left  Result Date: 08/15/2018 CLINICAL DATA:  Left hip  pain after fall today. EXAM: DG HIP (WITH OR WITHOUT PELVIS) 2-3V LEFT COMPARISON:  None. FINDINGS: Mildly displaced fracture is seen involving the proximal left femoral neck. Right hip appears normal. No significant degenerative changes noted. IMPRESSION: Mildly displaced proximal left femoral neck fracture. Electronically Signed   By: Lupita Raider, M.D.   On: 08/15/2018 12:06    Procedures Procedures (including critical care time)  Medications Ordered in ED Medications  hydrALAZINE (APRESOLINE) injection 10 mg ( Intravenous MAR Hold 08/15/18 1448)  0.9 %  sodium chloride infusion ( Intravenous New Bag/Given 08/15/18 1459)  ceFAZolin (ANCEF) IVPB 2g/100 mL premix (has no administration in time range)  chlorhexidine (HIBICLENS) 4 % liquid 4 application (has no administration in time range)  tranexamic acid (CYKLOKAPRON) IVPB 1,000 mg (has no administration in time range)  ondansetron (ZOFRAN) injection 4 mg (4 mg Intravenous Given 08/15/18 1248)  morphine 4 MG/ML injection 4 mg (4 mg Intravenous Given 08/15/18 1248)  nitroGLYCERIN (NITROGLYN) 2 % ointment 0.5 inch (0.5 inches Topical Given 08/15/18 1355)  povidone-iodine 10 % swab 2 application (2 application Topical Given 08/15/18 1457)  lip balm (CARMEX) ointment (  Given 08/15/18 1517)     Initial Impression / Assessment and Plan / ED Course  I have reviewed the triage vital signs and the nursing notes.  Pertinent labs & imaging results that were available during my care of the patient were reviewed by me and considered in my medical decision making (see chart for details).    Patient here for evaluation of left hip pain following following a fall. She has mildly displaced left femoral neck fracture. Discussed with Dr. Magnus Ivan with orthopedics, who will see the patient and consult. Medicine consulted for admission and medical clearance. Patient updated of findings of studies recommendation for admission and she is in agreement with  treatment plan.  Final Clinical Impressions(s) / ED Diagnoses   Final diagnoses:  Surgery, elective    ED Discharge Orders    None       Tilden Fossa, MD 08/15/18 1553

## 2018-08-15 NOTE — Brief Op Note (Signed)
08/15/2018  5:24 PM  PATIENT:  Debbie Cardenas  73 y.o. female  PRE-OPERATIVE DIAGNOSIS:  fractured left hip  POST-OPERATIVE DIAGNOSIS:  fractured left hip  PROCEDURE:  Procedure(s): TOTAL HIP ARTHROPLASTY ANTERIOR APPROACH (Left)  SURGEON:  Surgeon(s) and Role:    Kathryne Hitch, MD - Primary  PHYSICIAN ASSISTANT:  Rexene Edison, PA-C  ANESTHESIA:   spinal  EBL: 200-200 cc  COUNTS:  YES  TOURNIQUET:  * No tourniquets in log *  DICTATION: .Other Dictation: Dictation Number [id number]  PLAN OF CARE: Admit to inpatient   PATIENT DISPOSITION:  PACU - hemodynamically stable.   Delay start of Pharmacological VTE agent (>24hrs) due to surgical blood loss or risk of bleeding: no

## 2018-08-15 NOTE — ED Notes (Signed)
Bed: [facility] Expected date:  Expected time:  Means of arrival:  Comments: EMS-fall 

## 2018-08-15 NOTE — ED Triage Notes (Signed)
[place]  Reports pt was exiting store when she tripped on the curb and fell on to left hip, denies hitting her head no LOC.  C/o hip pain radiating down left leg.  Mostly hurts when she moves.  Pt reports difficulty with foot rotation. No obvious shortening. Denies taking blood thinners.   AOx4.

## 2018-08-15 NOTE — Transfer of Care (Signed)
Immediate Anesthesia Transfer of Care Note  Patient: Debbie Cardenas  Procedure(s) Performed: TOTAL HIP ARTHROPLASTY ANTERIOR APPROACH (Left Hip)  Patient Location: PACU  Anesthesia Type:Regional  Level of Consciousness: awake, alert  and oriented  Airway & Oxygen Therapy: Patient Spontanous Breathing and Patient connected to face mask oxygen  Post-op Assessment: Report given to RN and Post -op Vital signs reviewed and stable  Post vital signs: Reviewed and stable  Last Vitals:  Vitals Value Taken Time  BP 143/73 08/15/2018  5:45 PM  Temp    Pulse 99 08/15/2018  5:46 PM  Resp 16 08/15/2018  5:46 PM  SpO2 100 % 08/15/2018  5:46 PM  Vitals shown include unvalidated device data.  Last Pain:  Vitals:   08/15/18 1522  PainSc: 4          Complications: No apparent anesthesia complications

## 2018-08-15 NOTE — H&P (Signed)
[facility] History and Physical  Debbie Exonamela L Housley ZOX:[id number] DOB: 07/27/1946 DOA: 08/15/2018  Referring physician: ED  PCP: Patient, No Pcp Per   Chief Complaint: Left hip pain status post fall  HPI: Debbie Cardenas is a 73 y.o. female with no documented past medical history presented to the hospital after sustaining a mechanical fall today she was shopping when she tripped over a curb and fell on the left side of her body and landed on the hips with subsequent hip pain and difficulty moving her hip.  Denies any dizziness, lightheadedness, chest pain prior to the fall.  She did not lose any consciousness.  Patient stated that it was purely a mechanical fall. Patient is fully ambulatory and is independent.  Patient denies any chest pain on ambulation or dyspnea at baseline.  Denies cough, runny nose, fever chills or rigors.  Less urinary urgency, frequency or dysuria.  Denies any nausea, vomiting, abdominal pain, constipation or diarrhea.  ED Course: In the ED, patient complained of left hip pain and x-ray of the hip badly displaced left femoral neck fracture.  Orthopedics was consulted and hospitalist team was notified for admission to the hospital.  Review of Systems:  All systems were reviewed and were negative unless otherwise mentioned in the HPI  History reviewed. No pertinent past medical history. Past Surgical History:  Procedure Laterality Date  . CATARACT EXTRACTION, BILATERAL    . KNEE SURGERY      Social History:  reports that she has quit smoking. She has never used smokeless tobacco. She reports previous alcohol use. She reports that she does not use drugs.  Not on File  Family History  Problem Relation Age of Onset  . Cancer Sister   . Heart attack Brother      Prior to Admission medications   Medication Sig Start Date End Date Taking? Authorizing Provider  acetaminophen (TYLENOL) 325 MG tablet Take 650 mg by mouth every 6 (six) hours as needed for mild  pain or headache.   Yes [provider]  OVER THE COUNTER MEDICATION Take 1 tablet by mouth daily. OTC med to help with bowel movements. Says its not a stool softer or probiotic.   Yes [provider]    Physical Exam: Vitals:   08/15/18 1131 08/15/18 1138 08/15/18 1324 08/15/18 1327  BP:  (!) 242/81 (!) 226/91 (!) 221/78  Pulse:  68  80  Resp:  18 16 16   SpO2: 98% 97%  96%   Wt Readings from Last 3 Encounters:  No data found for Wt   There is no height or weight on file to calculate BMI.  General:  Average built, not in obvious distress HENT: Normocephalic, pupils equally reacting to light and accommodation.  No scleral pallor or icterus noted. Oral mucosa is moist.  Chest:  Clear breath sounds.  Diminished breath sounds bilaterally. No crackles or wheezes.  CVS: S1 &S2 heard. No murmur.  Regular rate and rhythm. Abdomen: Soft, nontender, nondistended.  Bowel sounds are heard.  Liver is not palpable, no abdominal mass palpated Extremities: No cyanosis, clubbing or edema.  Peripheral pulses are palpable.  Left hip.  Tenderness on palpation.  Externally rotated left lower extremity. Psych: Alert, awake and oriented, normal mood CNS:  No cranial nerve deficits.  Power equal in all extremities.   No cerebellar signs.   Skin: Warm and dry.  No rashes noted.  Labs on Admission:  Basic Metabolic Panel: Recent Labs  Lab 08/15/18 1249  NA 140  K 4.1  CL 104  CO2 26  GLUCOSE 114*  BUN 22  CREATININE 0.79  CALCIUM 9.2   Liver Function Tests: No results for input(s): AST, ALT, ALKPHOS, BILITOT, PROT, ALBUMIN in the last 168 hours. No results for input(s): LIPASE, AMYLASE in the last 168 hours. No results for input(s): AMMONIA in the last 168 hours. CBC: Recent Labs  Lab 08/15/18 1249  WBC 10.7*  NEUTROABS 8.3*  HGB 14.1  HCT 43.0  MCV 92.9  PLT 247   Cardiac Enzymes: No results for input(s): CKTOTAL, CKMB, CKMBINDEX, TROPONINI in the last 168  hours.  BNP (last 3 results) No results for input(s): BNP in the last 8760 hours.  ProBNP (last 3 results) No results for input(s): PROBNP in the last 8760 hours.  CBG: No results for input(s): GLUCAP in the last 168 hours.   Radiological Exams on Admission: Dg Hip Unilat With Pelvis 2-3 Views Left  Result Date: 08/15/2018 CLINICAL DATA:  Left hip pain after fall today. EXAM: DG HIP (WITH OR WITHOUT PELVIS) 2-3V LEFT COMPARISON:  None. FINDINGS: Mildly displaced fracture is seen involving the proximal left femoral neck. Right hip appears normal. No significant degenerative changes noted. IMPRESSION: Mildly displaced proximal left femoral neck fracture. Electronically Signed   By: Lupita RaiderJames  Green Jr, M.D.   On: 08/15/2018 12:06    EKG: Personally reviewed by me which shows normal sinus rhythm  Assessment/Plan Active Problems:   Hip fracture (HCC)  Left femoral neck fracture.  Status post mechanical fall.  Patient has been seen by orthopedics.  We will keep the patient n.p.o.  Patient does not have any red flag signs including decompensated heart failure, diabetes mellitus needing insulin, advanced CKD, or significant cardiac disease including valvular heart disease and signs and symptoms of coronary ischemia.  Patient is medically stable for hip surgery without contraindications.  Elevated blood pressure on presentation without history of hypertension.  Patient has not been to a doctor in 40 years.  Unsure whether this represents chronic hypertension versus acute elevation of blood pressure secondary to fall, pain, hospital anxiety and trauma.  Will closely monitor.  We will put the patient on PRN antihypertensives for now.  Give 1 dose of nitroglycerin topical.  2D echocardiogram to assess for LV function after surgery.  Check lipid profile, TSH and A1c routinely   Consultant: Orthopedic Dr. Magnus IvanBlackman has been notified  Code Status: Full code  DVT Prophylaxis: Lovenox after  surgery  Antibiotics: None  Family Communication:  Patients' condition and plan of care including tests being ordered have been discussed with the patient and husband who indicate understanding and agree with the plan.  Disposition Plan: Home with home health/rehab  Severity of Illness: The appropriate patient status for this patient is INPATIENT. Inpatient status is judged to be reasonable and necessary in order to provide the required intensity of service to ensure the patient's safety. The patient's presenting symptoms, physical exam findings, and initial radiographic and laboratory data in the context of their chronic comorbidities is felt to place them at high risk for further clinical deterioration. Furthermore, it is not anticipated that the patient will be medically stable for discharge from the hospital within 2 midnights of admission. I certify that at the point of admission it is my clinical judgment that the patient will require inpatient hospital care spanning beyond 2 midnights from the point of admission due to high intensity of service, high risk for further deterioration and high frequency of  surveillance required.   Signed, Joycelyn Das, MD [facility] 08/15/2018

## 2018-08-15 NOTE — Anesthesia Postprocedure Evaluation (Signed)
Anesthesia Post Note  Patient: Debbie Cardenas  Procedure(s) Performed: TOTAL HIP ARTHROPLASTY ANTERIOR APPROACH (Left Hip)     Patient location during evaluation: PACU Anesthesia Type: Spinal Level of consciousness: awake and alert Pain management: pain level controlled Vital Signs Assessment: post-procedure vital signs reviewed and stable Respiratory status: spontaneous breathing and respiratory function stable Cardiovascular status: blood pressure returned to baseline and stable Postop Assessment: spinal receding and no apparent nausea or vomiting Anesthetic complications: no    Last Vitals:  Vitals:   08/15/18 1800 08/15/18 1815  BP: (!) 144/81 (!) 161/73  Pulse: 99 91  Resp: (!) 21 13  Temp:    SpO2: 100% 100%    Last Pain:  Vitals:   08/15/18 1745  PainSc: 0-No pain                 Beryle Lathe

## 2018-08-15 NOTE — Anesthesia Preprocedure Evaluation (Addendum)
Anesthesia Evaluation  Patient identified by MRN, date of birth, ID band Patient awake    Reviewed: Allergy & Precautions, NPO status , Patient's Chart, lab work & pertinent test results  History of Anesthesia Complications Negative for: history of anesthetic complications  Airway Mallampati: III  TM Distance: >3 FB Neck ROM: Full    Dental  (+) Dental Advisory Given, Partial Upper, Partial Lower, Chipped,    Pulmonary former smoker,    breath sounds clear to auscultation       Cardiovascular  Rhythm:Regular Rate:Normal   Elevated BPs since arrival, patient denies all medical hx but has not seen a PCP in many years    Neuro/Psych negative neurological ROS  negative psych ROS   GI/Hepatic negative GI ROS, Neg liver ROS,   Endo/Other  negative endocrine ROS  Renal/GU negative Renal ROS     Musculoskeletal negative musculoskeletal ROS (+)   Abdominal   Peds  Hematology negative hematology ROS (+)   Anesthesia Other Findings   Reproductive/Obstetrics                            Anesthesia Physical Anesthesia Plan  ASA: I  Anesthesia Plan: Spinal   Post-op Pain Management:    Induction: Intravenous  PONV Risk Score and Plan: 4 or greater and 3 and Treatment may vary due to age or medical condition, Ondansetron and Propofol infusion  Airway Management Planned: Simple Face Mask and Natural Airway  Additional Equipment: None  Intra-op Plan:   Post-operative Plan:   Informed Consent: I have reviewed the patients History and Physical, chart, labs and discussed the procedure including the risks, benefits and alternatives for the proposed anesthesia with the patient or authorized representative who has indicated his/her understanding and acceptance.       Plan Discussed with: CRNA and Anesthesiologist  Anesthesia Plan Comments: (Labs reviewed, platelets acceptable. Discussed risks  and benefits of spinal, including spinal/epidural hematoma, infection, failed block, and PDPH. Also discussed general anesthetic as an alternative option. Patient expressed understanding and wished to proceed with the spinal. )       Anesthesia Quick Evaluation

## 2018-08-15 NOTE — Op Note (Signed)
[name], Cardenas MEDICAL RECORD AX:[id number] ACCOUNT [id number] DATE OF BIRTH:21-Apr-1946 FACILITY: [facility] LOCATION: [facility] PHYSICIAN:Kearney Evitt Debbie Parrot, MD  OPERATIVE REPORT  DATE OF PROCEDURE:  08/15/2018  PREOPERATIVE DIAGNOSIS:  Left hip with displaced femoral neck fracture.  POSTOPERATIVE DIAGNOSIS:  Left hip with displaced femoral neck fracture.  PROCEDURE:  Left total hip arthroplasty through direct anterior approach.  IMPLANTS:  DePuy Sector Gription acetabular component size 52, size 36+0 neutral polyethylene liner, size 12 Corail femoral component with standard offset, size 36 -2 metal hip ball.  SURGEON:  Vanita Panda. Magnus Ivan, MD  ASSISTANT:  Richardean Canal, PA-C  ANESTHESIA:  Spinal.  ANTIBIOTICS:  Two grams IV Ancef.  ESTIMATED BLOOD LOSS:  200-300 mL.  COMPLICATIONS:  None.  INDICATIONS:  The patient is a very pleasant 73 year old female today who accidentally sustained a mechanical fall when she tripped over a curb.  She landed on her left hip and had the inability to ambulate after this with significant left hip pain.  She  was brought to the [facility] Emergency Room and found to have what they felt was a nondisplaced femoral neck fracture with a lateral view that does show that there is displacement of the fracture.  She has had no preexisting history of hip pain and is  a very active individual.  Given her young age of 4 and the displaced nature of the fracture, I have recommended total hip arthroplasty through direct anterior approach.  I had a long and thorough discussion with her and her husband about this  including the risks and benefits of surgery and why our recommendation would be a hip replacement.  All questions and concerns were answered and addressed, and she did agree to proceed with surgery.  We talked about the risk of acute blood loss anemia,  nerve or vessel injury, fracture, infection, dislocation, DVT and implant failure.  We  talked about her goals being decreased pain, improved mobility, and overall improved quality of life.  DESCRIPTION OF PROCEDURE:  After informed consent was obtained and appropriate left hip was marked, she was brought to the operating room and turned on her side on the stretcher where spinal anesthesia was then obtained.  She was then laid in the supine  position on a stretcher.  Foley catheter was placed, and traction boots were placed on both her feet.  Next, she was placed supine on the Hana fracture table, the perineal post in place, and both legs in in-line skeletal traction device and no traction  applied.  Her left operative hip was prepped and draped with DuraPrep and sterile drapes.  A time-out was called.  She was identified as correct patient, correct left hip.  We then made an incision just inferior and posterior to the anterior superior  iliac spine and carried this obliquely down the leg.  We dissected down tensor fascia lata muscle.  Tensor fascia was then divided longitudinally to proceed with a direct anterior approach to the hip.  We identified and cauterized circumflex vessels,  then identified the hip capsule, opened the hip capsule finding a significant hematoma consistent with a femoral neck fracture.  Then we did find a displaced femoral neck fracture.  We placed a Cobra retractor around the rim of the medial and lateral  femoral neck and then made a freshening femoral neck cut just proximal to the lesser trochanter with an oscillating saw and completed this with an osteotome.  We removed remnants of the remaining femoral neck and then placed  a corkscrew guide in the  femoral head and removed the femoral head in its entirety and passed this off the back table.  We then removed remnants of the bony debris and the hip socket itself and removed acetabular labrum and other debris.  I placed a bent Hohmann over the medial  acetabular rim and then began reaming in 1 mm increments from a  size 44 reamer stepwise up to a size 51 with all reamers under direct visualization, the last reamer under direct fluoroscopy, so we could obtain our depth of reaming, our inclination and  anteversion.  We then placed the real DePuy Sector Gription acetabular component size 52 and 36+0 neutral polyethylene liner.  Attention was then turned to the femur.  With the leg externally rotated to 120 degrees, extended and adducted, we placed a  Mueller retractor medially and a Hohmann retractor above the greater trochanter, released the lateral joint capsule and used a box-cutting osteotome to enter the femoral canal and a rongeur to lateralize it.  We then began broaching from a size 8 broach  using Corail broaching system, going up to a size 12.  With the size 12 in place, we tried a standard femoral neck, and based on the higher neck cut, we went with a 36 -2 trial hip ball, reduced this in the acetabulum.  We were pleased with the leg  length, offset, range of motion and stability.  We then dislocated the hip and removed the trial components.  We placed the real Corail femoral component size 12 with standard offset and the real 36 -2 metal hip ball and again reduced this into the  acetabulum.  We felt we had achieved a native anatomy, and she felt like her offset and stability was good range of motion.  We then irrigated the soft tissue with normal saline solution using pulsatile lavage.  We closed the joint capsule with  interrupted #1 Ethibond suture, followed by running 0 Vicryl and tensor fascia, 0 Vicryl in the deep tissue, 2-0 Vicryl subcutaneous tissue, and interrupted staples were used to close the skin.  Xeroform and Aquacel dressings were applied.  She was taken  off the Hana table and taken to recovery room in stable condition.  All final counts were correct.  There were no complications noted.  Of note, Rexene Edison, PA-C, assisted the entire case.  His assistance was crucial for facilitating all aspects  of this  case.  LN/NUANCE  D:08/15/2018 T:08/15/2018 JOB:[id number]

## 2018-08-16 ENCOUNTER — Inpatient Hospital Stay (HOSPITAL_COMMUNITY): Payer: Medicare Other

## 2018-08-16 DIAGNOSIS — I10 Essential (primary) hypertension: Secondary | ICD-10-CM

## 2018-08-16 LAB — CBC
HCT: 37.5 % (ref 36.0–46.0)
Hemoglobin: 12 g/dL (ref 12.0–15.0)
MCH: 30.2 pg (ref 26.0–34.0)
MCHC: 32 g/dL (ref 30.0–36.0)
MCV: 94.2 fL (ref 80.0–100.0)
Platelets: 244 10*3/uL (ref 150–400)
RBC: 3.98 MIL/uL (ref 3.87–5.11)
RDW: 13.4 % (ref 11.5–15.5)
WBC: 11.1 10*3/uL — ABNORMAL HIGH (ref 4.0–10.5)
nRBC: 0 % (ref 0.0–0.2)

## 2018-08-16 LAB — ECHOCARDIOGRAM COMPLETE
Height: 65 in
Weight: 3206.37 oz

## 2018-08-16 LAB — BASIC METABOLIC PANEL
Anion gap: 10 (ref 5–15)
BUN: 25 mg/dL — ABNORMAL HIGH (ref 8–23)
CO2: 22 mmol/L (ref 22–32)
Calcium: 8.5 mg/dL — ABNORMAL LOW (ref 8.9–10.3)
Chloride: 106 mmol/L (ref 98–111)
Creatinine, Ser: 0.83 mg/dL (ref 0.44–1.00)
GFR calc Af Amer: >60 mL/min (ref >60)
GFR calc non Af Amer: >60 mL/min (ref >60)
Glucose, Bld: 158 mg/dL — ABNORMAL HIGH (ref 70–99)
Potassium: 3.7 mmol/L (ref 3.5–5.1)
Sodium: 138 mmol/L (ref 135–145)

## 2018-08-16 LAB — ABO/RH: ABO/RH(D): O POS

## 2018-08-16 MED ORDER — CLONAZEPAM 0.5 MG PO TABS
0.2500 mg | ORAL_TABLET | Freq: Two times a day (BID) | ORAL | Status: DC | PRN
  Administered 2018-08-17: 0.25 mg via ORAL
  Filled 2018-08-16: qty 1

## 2018-08-16 NOTE — Progress Notes (Signed)
PROGRESS NOTE    Debbie Cardenas  ZOX:[id number] DOB: 03-Mar-1946 DOA: 08/15/2018 PCP: Patient, No Pcp Per    Brief Narrative: 73 year old with no significant past medical history who presents after a mechanical fall found to have left hip fracture.  By Dr. Magnus IvanBlackman.  She underwent total hip arthroplasty anterior approach on the left on August 15, 2018.    Assessment & Plan:   Principal Problem:   Closed fracture of neck of left femur (HCC) Active Problems:   Hip fracture (HCC)  Left femoral neck fracture; Status post mechanical fall. Underwent total hip arthroplasty anterior approach on the left on August 15, 2018 by Dr. Magnus IvanBlackman. PT evaluation. Pain management, bowel regimen. DVT prophylaxis per Ortho  Elevated blood pressure; She received a dose of hydralazine Unclear if prior history of hypertension or blood pressure is elevated in the setting pain Continue  to monitor Echo order Anxiety; Xanax PRN ordered.   Estimated body mass index is 33.35 kg/m as calculated from the following:   Height as of this encounter: 5\' 5"  (1.651 m).   Weight as of this encounter: 90.9 kg.   DVT prophylaxis: On aspirin Code Status: Full code Family Communication: Husband who was at bedside Disposition Plan: PT evaluation today postoperative day 1.  Consultants:   Dr. Magnus IvanBlackman   Procedures:  Underwent total hip arthroplasty anterior approach on the left on August 15, 2018 by Dr. Magnus IvanBlackman.  Antimicrobials: Ancef prophylaxis for surgery  Subjective: Reports pain control. No bowel movement. No chest pain shortness of breath. Objective: Vitals:   08/15/18 2053 08/15/18 2150 08/16/18 0135 08/16/18 0543  BP: (!) 145/62 (!) 151/68 (!) 145/64 (!) 144/71  Pulse: 90 90 82 81  Resp:  17 17 16   Temp: 98.5 F (36.9 C) 98.5 F (36.9 C) 97.8 F (36.6 C) 97.9 F (36.6 C)  TempSrc:    Oral  SpO2: 95% 98% 98% 98%  Weight:    90.9 kg  Height:        Intake/Output Summary  (Last 24 hours) at 08/16/2018 0727 Last data filed at 08/16/2018 0600 Gross per 24 hour  Intake 2219.8 ml  Output 1110 ml  Net 1109.8 ml   Filed Weights   08/15/18 1522 08/16/18 0543  Weight: 81.6 kg 90.9 kg    Examination:  General exam: Appears calm and comfortable  Respiratory system: Clear to auscultation. Respiratory effort normal. Cardiovascular system: S1 & S2 heard, RRR. No JVD, murmurs, rubs, gallops or clicks. No pedal edema. Gastrointestinal system: Abdomen is nondistended, soft and nontender. No organomegaly or masses felt. Normal bowel sounds heard. Central nervous system: Alert and oriented. No focal neurological deficits. Extremities: Symmetric 5 x 5 power.  Left hip with clean dressing Skin: No rashes, lesions or ulcers Psychiatry: Judgement and insight appear normal. Mood & affect appropriate.     Data Reviewed: I have personally reviewed following labs and imaging studies  CBC: Recent Labs  Lab 08/15/18 1249 08/15/18 1858 08/16/18 0252  WBC 10.7* 15.2* 11.1*  NEUTROABS 8.3*  --   --   HGB 14.1 13.6 12.0  HCT 43.0 42.0 37.5  MCV 92.9 92.5 94.2  PLT 247 249 244   Basic Metabolic Panel: Recent Labs  Lab 08/15/18 1249 08/15/18 1858 08/16/18 0252  NA 140  --  138  K 4.1  --  3.7  CL 104  --  106  CO2 26  --  22  GLUCOSE 114*  --  158*  BUN 22  --  25*  CREATININE 0.79 0.91 0.83  CALCIUM 9.2  --  8.5*   GFR: Estimated Creatinine Clearance: 69.3 mL/min (by C-G formula based on SCr of 0.83 mg/dL). Liver Function Tests: No results for input(s): AST, ALT, ALKPHOS, BILITOT, PROT, ALBUMIN in the last 168 hours. No results for input(s): LIPASE, AMYLASE in the last 168 hours. No results for input(s): AMMONIA in the last 168 hours. Coagulation Profile: No results for input(s): INR, PROTIME in the last 168 hours. Cardiac Enzymes: No results for input(s): CKTOTAL, CKMB, CKMBINDEX, TROPONINI in the last 168 hours. BNP (last 3 results) No results for  input(s): PROBNP in the last 8760 hours. HbA1C: Recent Labs    08/15/18 1502  HGBA1C 5.1   CBG: Recent Labs  Lab 08/15/18 1831  GLUCAP 124*   Lipid Profile: Recent Labs    08/15/18 1502  CHOL 221*  HDL 55  LDLCALC 154*  TRIG 59  CHOLHDL 4.0   Thyroid Function Tests: Recent Labs    08/15/18 1502  TSH 0.472   Anemia Panel: No results for input(s): VITAMINB12, FOLATE, FERRITIN, TIBC, IRON, RETICCTPCT in the last 72 hours. Sepsis Labs: No results for input(s): PROCALCITON, LATICACIDVEN in the last 168 hours.  Recent Results (from the past 240 hour(s))  Surgical pcr screen     Status: None   Collection Time: 08/15/18  2:55 PM  Result Value Ref Range Status   MRSA, PCR NEGATIVE NEGATIVE Final   Staphylococcus aureus NEGATIVE NEGATIVE Final    Comment: (NOTE) The Xpert SA Assay (FDA approved for NASAL specimens in patients 72 years of age and older), is one component of a comprehensive surveillance program. It is not intended to diagnose infection nor to guide or monitor treatment. Performed at [facility], [address] [place], [place] [address]          Radiology Studies: Dg Pelvis Portable  Result Date: 08/15/2018 CLINICAL DATA:  Left total hip replacement EXAM: PORTABLE PELVIS 1-2 VIEWS COMPARISON:  None. FINDINGS: Interval left total hip arthroplasty without hardware failure or complication. No fracture or dislocation. Normal alignment. Postsurgical changes in the surrounding soft tissues. IMPRESSION: Interval left total hip arthroplasty. Electronically Signed   By: Elige KoHetal  Patel   On: 08/15/2018 19:16   Dg C-arm 1-60 Min-no Report  Result Date: 08/15/2018 Fluoroscopy was utilized by the requesting physician.  No radiographic interpretation.   Dg Hip Operative Unilat W Or W/o Pelvis Left  Result Date: 08/15/2018 CLINICAL DATA:  Closed fracture left femur.  Left hip replacement EXAM: OPERATIVE left HIP (WITH PELVIS IF PERFORMED) 10  VIEWS TECHNIQUE: Fluoroscopic spot image(s) were submitted for interpretation post-operatively. COMPARISON:  08/15/2018 FINDINGS: Left hip replacement in satisfactory position and alignment. No complication IMPRESSION: Satisfactory left hip replacement. Electronically Signed   By: Marlan Palauharles  Clark M.D.   On: 08/15/2018 17:32   Dg Hip Unilat With Pelvis 2-3 Views Left  Result Date: 08/15/2018 CLINICAL DATA:  Left hip pain after fall today. EXAM: DG HIP (WITH OR WITHOUT PELVIS) 2-3V LEFT COMPARISON:  None. FINDINGS: Mildly displaced fracture is seen involving the proximal left femoral neck. Right hip appears normal. No significant degenerative changes noted. IMPRESSION: Mildly displaced proximal left femoral neck fracture. Electronically Signed   By: Lupita RaiderJames  Green Jr, M.D.   On: 08/15/2018 12:06        Scheduled Meds: . aspirin  81 mg Oral BID  . docusate sodium  100 mg Oral BID  . pantoprazole  40 mg Oral Daily  Continuous Infusions: . sodium chloride    . sodium chloride 75 mL/hr at 08/15/18 1932  . methocarbamol (ROBAXIN) IV       LOS: 1 day    Time spent: 35 minutes.     Alba Cory, MD [facility]  If 7PM-7AM, please contact night-coverage [email or web address] Password [facility] 08/16/2018, 7:27 AM

## 2018-08-16 NOTE — Plan of Care (Signed)
  Problem: Clinical Measurements: Goal: Will remain free from infection Outcome: Progressing   Problem: Clinical Measurements: Goal: Diagnostic test results will improve Outcome: Progressing   Problem: Clinical Measurements: Goal: Cardiovascular complication will be avoided Outcome: Progressing   Problem: Nutrition: Goal: Adequate nutrition will be maintained Outcome: Progressing   Problem: Coping: Goal: Level of anxiety will decrease Outcome: Progressing   

## 2018-08-16 NOTE — Plan of Care (Signed)
Pt stable with no needs. No changes to note. No s/s of distress or pain. No changes to note. Pt does remain weak and anxious overall. Rn medicating for pain and other needs. Will continue to monitor.

## 2018-08-16 NOTE — Progress Notes (Signed)
  Echocardiogram 2D Echocardiogram has been performed.  [name] 08/16/2018, 2:04 PM

## 2018-08-16 NOTE — Evaluation (Signed)
Physical Therapy Evaluation Patient Details Name: SHAKIERRA Cardenas MRN: [id number] DOB: 05-13-1946 Today's Date: 08/16/2018   History of Present Illness  Patient is a 73 year old female admitted 08/15/2018 with a closed fracture of neck of left femur. Patient underwent a left THA w/ direct anterior approach on 08/15/2018. PMH: anxiety.    Clinical Impression  Patient is s/p above surgery resulting in functional limitations due to the deficits listed below (see PT Problem List). Patient performed well tolerating ambulation with RW 250 with min guard and ambulating up and down 2 stairs with two handrails step-to pattern with min guard. Patient did fatigue after stairs and requested to sit and ride in recliner back to room. Patient was sitting in recliner at beginning and end of session. Patient will benefit from skilled PT to increase their independence and safety with mobility to allow discharge to the venue listed below.        Follow Up Recommendations Home health PT;Supervision - Intermittent;Supervision for mobility/OOB    Equipment Recommendations  Rolling walker with 5" wheels    Recommendations for Other Services       Precautions / Restrictions Precautions Precautions: Fall Restrictions Weight Bearing Restrictions: Yes LLE Weight Bearing: Weight bearing as tolerated      Mobility  Bed Mobility               General bed mobility comments: patient received in recliner and in recliner at end of session  Transfers Overall transfer level: Needs assistance Equipment used: Rolling walker (2 wheeled) Transfers: Sit to/from [employer] Sit to Stand: Min guard Stand pivot transfers: Min guard          Ambulation/Gait Ambulation/Gait assistance: Min guard Gait Distance (Feet): 250 Feet Assistive device: Rolling walker (2 wheeled) Gait Pattern/deviations: Step-through pattern;Decreased step length - right;Decreased stance time - left;Decreased stride  length Gait velocity: decreased   General Gait Details: somewhat slow, labored limited by fatigue  Stairs Stairs: Yes Stairs assistance: Min guard;Min assist Stair Management: Two rails;Step to pattern;Forwards Number of Stairs: 2 General stair comments: cues for step-to pattern  Wheelchair Mobility    Modified Rankin (Stroke Patients Only)       Balance Overall balance assessment: Needs assistance Sitting-balance support: Bilateral upper extremity supported;Feet supported Sitting balance-Leahy Scale: Good     Standing balance support: Bilateral upper extremity supported;During functional activity Standing balance-Leahy Scale: Fair Standing balance comment: fair w/ RW                             Pertinent Vitals/Pain Pain Assessment: No/denies pain Pain Intervention(s): Limited activity within patient's tolerance;Monitored during session    Home Living Family/patient expects to be discharged to:: Private residence Living Arrangements: Spouse/significant other Available Help at Discharge: Available 24 hours/day Type of Home: House Home Access: Stairs to enter Entrance Stairs-Rails: Can reach both Entrance Stairs-Number of Steps: 4 Home Layout: One level Home Equipment: Walker - 4 wheels;Cane - single point      Prior Function Level of Independence: Independent         Comments: periodically would use 4WW due to left knee long standing problems.     Hand Dominance   Dominant Hand: Left    Extremity/Trunk Assessment   Upper Extremity Assessment Upper Extremity Assessment: Overall WFL for tasks assessed    Lower Extremity Assessment Lower Extremity Assessment: Overall WFL for tasks assessed;LLE deficits/detail LLE Deficits / Details: WBAT    Cervical /  Trunk Assessment Cervical / Trunk Assessment: Normal  Communication   Communication: No difficulties  Cognition Arousal/Alertness: Awake/alert Behavior During Therapy: WFL for tasks  assessed/performed Overall Cognitive Status: Within Functional Limits for tasks assessed                                        General Comments      Exercises     Assessment/Plan    PT Assessment Patient needs continued PT services  PT Problem List Decreased strength;Decreased mobility;Decreased activity tolerance;Decreased balance;Decreased knowledge of use of DME;Pain       PT Treatment Interventions DME instruction;Therapeutic activities;Gait training;Therapeutic exercise;Patient/family education;Balance training;Stair training    PT Goals (Current goals can be found in the Care Plan section)  Acute Rehab PT Goals Patient Stated Goal: get better and return home PT Goal Formulation: With patient Time For Goal Achievement: 08/30/18 Potential to Achieve Goals: Good    Frequency 7X/week   Barriers to discharge        Co-evaluation               AM-PAC PT "6 Clicks" Mobility  Outcome Measure Help needed turning from your back to your side while in a flat bed without using bedrails?: A Lot Help needed moving from lying on your back to sitting on the side of a flat bed without using bedrails?: A Lot Help needed moving to and from a bed to a chair (including a wheelchair)?: A Little Help needed standing up from a chair using your arms (e.g., wheelchair or bedside chair)?: A Little Help needed to walk in hospital room?: A Little Help needed climbing 3-5 steps with a railing? : A Lot 6 Click Score: 15    End of Session Equipment Utilized During Treatment: Gait belt Activity Tolerance: Patient tolerated treatment well;Patient limited by fatigue Patient left: in chair;with family/visitor present Nurse Communication: Mobility status PT Visit Diagnosis: Unsteadiness on feet (R26.81);Difficulty in walking, not elsewhere classified (R26.2)    Time: 8757-9728 PT Time Calculation (min) (ACUTE ONLY): 30 min   Charges:   PT Evaluation $PT Eval Low  Complexity: 1 Low PT Treatments $Gait Training: 8-22 mins        Katina Dung. Hartnett-Rands, MS, PT [id number] 08/16/2018, 12:23 PM

## 2018-08-16 NOTE — Progress Notes (Signed)
Patient ID: Debbie Cardenas, female   DOB: 03/17/1946, 73 y.o.   MRN: [id number] She tolerated her surgery yesterday very well.  Comfortable this am.  Vitals and labs stable.  She does have some slight anxiety and occasionally takes Klonopin at home.  I will order a small dose for her.  PT/OT today with WBAT.  Can likely be discharged to home tomorrow with HHPT.

## 2018-08-16 NOTE — Evaluation (Signed)
Occupational Therapy Evaluation Patient Details Name: Debbie Cardenas MRN: [id number] DOB: 24-Sep-1945 Today's Date: 08/16/2018    History of Present Illness Patient is a 73 year old female admitted 08/15/2018 with a closed fracture of neck of left femur. Patient underwent a left THA w/ direct anterior approach on 08/15/2018. PMH: anxiety.   Clinical Impression   Pt underwent above sx after a mechanical fall.  She is very independent natured and will have 24/7 at home. Will follow in acute setting with min guard to supervision level goals. She currently needs min A for toilet transfers and mod A for LB dressing    Follow Up Recommendations  Supervision/Assistance - 24 hour    Equipment Recommendations  3 in 1 bedside commode    Recommendations for Other Services       Precautions / Restrictions Precautions Precautions: Fall Restrictions Weight Bearing Restrictions: Yes LLE Weight Bearing: Weight bearing as tolerated      Mobility Bed Mobility Overal bed mobility: Modified Independent             General bed mobility comments: HOB raised  Transfers Overall transfer level: Needs assistance Equipment used: Rolling walker (2 wheeled) Transfers: Sit to/from [employer]Stand;Stand Pivot Transfers Sit to Stand: Min guard;Min assist Stand pivot transfers: Min guard       General transfer comment: min guard from bed and min A from comfort height commode    Balance Overall balance assessment: Needs assistance Sitting-balance support: Bilateral upper extremity supported;Feet supported Sitting balance-Leahy Scale: Good     Standing balance support: Bilateral upper extremity supported;During functional activity Standing balance-Leahy Scale: Fair Standing balance comment: fair w/ RW                           ADL either performed or assessed with clinical judgement   ADL Overall ADL's : Needs assistance/impaired             Lower Body Bathing: Minimal  assistance;Sit to/from stand       Lower Body Dressing: Moderate assistance;Sit to/from stand   Toilet Transfer: Minimal assistance;Ambulation;RW;Comfort height toilet;Grab bars   Toileting- Clothing Manipulation and Hygiene: Minimal assistance;Sit to/from stand         General ADL Comments: pt can perform UB adls with set up. She has a reacher:  educated that she can use this for pants. She is very independent natured.  Min A to rise from comfort height commode.  One of her toilets is a little higher than ours and one is a little lower.  Recommended she consider a 3:1 to make transfers easier, plus she can use it for a shower seat     Vision         Perception     Praxis      Pertinent Vitals/Pain Pain Assessment: Faces Faces Pain Scale: Hurts a little bit Pain Location: L hip Pain Descriptors / Indicators: Sore Pain Intervention(s): Limited activity within patient's tolerance;Monitored during session;Premedicated before session;Repositioned;Ice applied     Hand Dominance Left   Extremity/Trunk Assessment Upper Extremity Assessment Upper Extremity Assessment: Overall WFL for tasks assessed   Lower Extremity Assessment Lower Extremity Assessment: Overall WFL for tasks assessed;LLE deficits/detail LLE Deficits / Details: WBAT   Cervical / Trunk Assessment Cervical / Trunk Assessment: Normal   Communication Communication Communication: No difficulties   Cognition Arousal/Alertness: Awake/alert Behavior During Therapy: WFL for tasks assessed/performed Overall Cognitive Status: Within Functional Limits for tasks assessed  General Comments       Exercises     Shoulder Instructions      Home Living Family/patient expects to be discharged to:: Private residence Living Arrangements: Spouse/significant other Available Help at Discharge: Available 24 hours/day Type of Home: House Home Access: Stairs to  enter [employer] of Steps: 4 Entrance Stairs-Rails: Can reach both Home Layout: One level     Bathroom Shower/Tub: Producer, television/film/video: Handicapped height--one toilet; one is standard Bathroom Accessibility: Yes   Home Equipment: Environmental consultant - 4 wheels;Cane - single point          Prior Functioning/Environment Level of Independence: Independent        Comments: periodically would use 4WW due to left knee long standing problems.        OT Problem List: Pain;Decreased knowledge of use of DME or AE      OT Treatment/Interventions: Self-care/ADL training;DME and/or AE instruction;Patient/family education    OT Goals(Current goals can be found in the care plan section) Acute Rehab OT Goals Patient Stated Goal: get better and return home OT Goal Formulation: With patient Time For Goal Achievement: 08/23/18 Potential to Achieve Goals: Good ADL Goals Pt Will Perform Lower Body Dressing: with supervision;with adaptive equipment;sit to/from stand(pants with reacher) Pt Will Perform Tub/Shower Transfer: Shower transfer;3 in 1;with min guard assist;ambulating  OT Frequency: Min 2X/week   Barriers to D/C:            Co-evaluation              AM-PAC OT "6 Clicks" Daily Activity     Outcome Measure Help from another person eating meals?: None Help from another person taking care of personal grooming?: A Little Help from another person toileting, which includes using toliet, bedpan, or urinal?: A Little Help from another person bathing (including washing, rinsing, drying)?: A Little Help from another person to put on and taking off regular upper body clothing?: A Little Help from another person to put on and taking off regular lower body clothing?: A Lot 6 Click Score: 18   End of Session    Activity Tolerance: Patient tolerated treatment well Patient left: in chair;with call bell/phone within reach;with family/visitor present  OT Visit  Diagnosis: Pain Pain - Right/Left: Left Pain - part of body: Hip                Time: 6440-3474 OT Time Calculation (min): 20 min Charges:  OT General Charges $OT Visit: 1 Visit OT Evaluation $OT Eval Low Complexity: 1 Low  Debbie Cardenas, OTR/L Acute Rehabilitation Services [phone] [facility] pager [phone] office 08/16/2018  Debbie Cardenas 08/16/2018, 12:37 PM

## 2018-08-17 MED ORDER — METHOCARBAMOL 500 MG PO TABS: 500.0000 mg | ORAL_TABLET | Freq: Three times a day (TID) | ORAL | 0 refills | Status: DC | PRN

## 2018-08-17 MED ORDER — ASPIRIN 81 MG PO CHEW: 81.0000 mg | CHEWABLE_TABLET | Freq: Two times a day (BID) | ORAL | 0 refills | Status: DC

## 2018-08-17 MED ORDER — HYDROCODONE-ACETAMINOPHEN 5-325 MG PO TABS: 1.0000 | ORAL_TABLET | Freq: Four times a day (QID) | ORAL | 0 refills | Status: DC | PRN

## 2018-08-17 MED ORDER — DOCUSATE SODIUM 100 MG PO CAPS: 100.0000 mg | ORAL_CAPSULE | Freq: Two times a day (BID) | ORAL | 0 refills | Status: DC

## 2018-08-17 MED ORDER — PANTOPRAZOLE SODIUM 40 MG PO TBEC: 40.0000 mg | DELAYED_RELEASE_TABLET | Freq: Every day | ORAL | 0 refills | Status: DC

## 2018-08-17 MED ORDER — POLYETHYLENE GLYCOL 3350 17 G PO PACK: 17.0000 g | PACK | Freq: Every day | ORAL | 0 refills | Status: DC | PRN

## 2018-08-17 NOTE — Plan of Care (Signed)
Pt to dc home today. No needs at this time. No changes to note. No s/s of distress or pain.

## 2018-08-17 NOTE — Progress Notes (Signed)
Physical Therapy Treatment Patient Details Name: Debbie Cardenas MRN: [id number] DOB: 1945/12/14 Today's Date: 08/17/2018    History of Present Illness Patient is a 73 year old female admitted 08/15/2018 with a closed fracture of neck of left femur. Patient underwent a left THA w/ direct anterior approach on 08/15/2018. PMH: anxiety.    PT Comments    Pt able to negotiate stairs again today with 2 rails and min/guard.  She is moving well overall and safe for d/c from PT standpoint.   Follow Up Recommendations  Home health PT;Supervision - Intermittent;Supervision for mobility/OOB     Equipment Recommendations  Rolling walker with 5" wheels    Recommendations for Other Services       Precautions / Restrictions Precautions Precautions: Fall Restrictions Weight Bearing Restrictions: No LLE Weight Bearing: Weight bearing as tolerated    Mobility  Bed Mobility Overal bed mobility: Modified Independent             General bed mobility comments: HOB raised(HOB raised. Encouraged pt to do with bed flat, but declined)  Transfers Overall transfer level: Needs assistance Equipment used: Rolling walker (2 wheeled) Transfers: Sit to/from Stand Sit to Stand: Min guard;Supervision         General transfer comment: min/guard to S from bed and raised toilet  Ambulation/Gait Ambulation/Gait assistance: Min guard;Supervision Gait Distance (Feet): 300 Feet Assistive device: Rolling walker (2 wheeled) Gait Pattern/deviations: Step-through pattern;Decreased step length - right;Decreased stance time - left;Decreased stride length     General Gait Details: cadence improved as gait progressed   Stairs Stairs: Yes Stairs assistance: Min guard Stair Management: Two rails;Step to pattern;Forwards Number of Stairs: 3 General stair comments: cues for proper sequence   Wheelchair Mobility    Modified Rankin (Stroke Patients Only)       Balance Overall balance  assessment: Needs assistance Sitting-balance support: Bilateral upper extremity supported;Feet supported Sitting balance-Leahy Scale: Good     Standing balance support: Bilateral upper extremity supported;During functional activity Standing balance-Leahy Scale: Fair Standing balance comment: fair w/ RW                            Cognition Arousal/Alertness: Awake/alert Behavior During Therapy: WFL for tasks assessed/performed Overall Cognitive Status: Within Functional Limits for tasks assessed                                        Exercises      General Comments        Pertinent Vitals/Pain Pain Assessment: 0-10 Pain Score: 3  Pain Location: L hip Pain Descriptors / Indicators: Sore Pain Intervention(s): Premedicated before session;Limited activity within patient's tolerance;Monitored during session    Home Living                      Prior Function            PT Goals (current goals can now be found in the care plan section) Acute Rehab PT Goals Patient Stated Goal: get better and return home PT Goal Formulation: With patient Time For Goal Achievement: 08/30/18 Potential to Achieve Goals: Good Progress towards PT goals: Progressing toward goals    Frequency    7X/week      PT Plan Current plan remains appropriate    Co-evaluation  AM-PAC PT "6 Clicks" Mobility   Outcome Measure  Help needed turning from your back to your side while in a flat bed without using bedrails?: A Little Help needed moving from lying on your back to sitting on the side of a flat bed without using bedrails?: A Little Help needed moving to and from a bed to a chair (including a wheelchair)?: A Little Help needed standing up from a chair using your arms (e.g., wheelchair or bedside chair)?: A Little Help needed to walk in hospital room?: A Little Help needed climbing 3-5 steps with a railing? : A Little 6 Click Score:  18    End of Session Equipment Utilized During Treatment: Gait belt Activity Tolerance: Patient tolerated treatment well Patient left: in chair;with call bell/phone within reach Nurse Communication: Mobility status PT Visit Diagnosis: Unsteadiness on feet (R26.81);Difficulty in walking, not elsewhere classified (R26.2)     Time: [id number] PT Time Calculation (min) (ACUTE ONLY): 21 min  Charges:  $Gait Training: 8-22 mins                     Janin Kozlowski L. Katrinka BlazingSmith, [place] Pager [phone] 08/17/2018    Enzo MontgomeryKaren L Briley Bumgarner 08/17/2018, 9:01 AM

## 2018-08-17 NOTE — Discharge Summary (Signed)
Physician Discharge Summary  Debbie Cardenas ZOX:[id number] DOB: [birth year] DOA: 08/15/2018  PCP: Patient, No Pcp Per  Admit date: 08/15/2018 Discharge date: 08/17/2018  Admitted From:  Home  Disposition: Home   Recommendations for Outpatient Follow-up:  1. Follow up with PCP in 1-2 weeks 2. Please obtain BMP/CBC in one week 3. Needs follow up with PCP for HTN, new diastolic dysfunction by ECHO.  4. Repeat Lipid panel , might need statins.   Home Health: yes, PT, OT   Discharge Condition: Stable.  CODE STATUS; full code Diet recommendation: Heart Healthy  Brief/Interim Summary: 73 year old with no significant past medical history who presents after a mechanical fall found to have left hip fracture.  By Dr. Magnus IvanBlackman.  She underwent total hip arthroplasty anterior approach on the left on August 15, 2018.    Assessment & Plan:   Principal Problem:   Closed fracture of neck of left femur (HCC) Active Problems:   Hip fracture (HCC)  Left femoral neck fracture; Status post mechanical fall. Underwent total hip arthroplasty anterior approach on the left on August 15, 2018 by Dr. Magnus IvanBlackman. PT evaluation. Did well with PT today[facility]  Pain management, bowel regimen. No BM but has pass gas. Advised to use suppository as needed.  DVT prophylaxis per Ortho Stable for discharge.   Elevated blood pressure; She received a dose of hydralazine Unclear if prior history of hypertension or blood pressure is elevated in the setting pain Continue  to monitor Echo  Ventricular hypertrophy, diastolic dysfunction.  SBP in the 130 range today. Needs follow up with PCP to consider start BP medications.   Anxiety; Xanax PRN ordered. Received while in the hospital/   Obese; needs to work on weight loss.    Estimated body mass index is 33.35 kg/m as calculated from the following:   Height as of this encounter: 5\' 5"  (1.651 m).   Weight as of this encounter: 90.9  kg.  Discharge Diagnoses:  Principal Problem:   Closed fracture of neck of left femur [facility](HCC) Active Problems:   Hip fracture [facility](HCC)    Discharge Instructions  Discharge Instructions    Diet - low sodium heart healthy   Complete by:  As directed    Increase activity slowly   Complete by:  As directed      Allergies as of 08/17/2018   No Known Allergies     Medication List    TAKE these medications   acetaminophen 325 MG tablet Commonly known as:  TYLENOL Take 650 mg by mouth every 6 (six) hours as needed for mild pain or headache.   aspirin 81 MG chewable tablet Chew 1 tablet (81 mg total) by mouth 2 (two) times daily.   docusate sodium 100 MG capsule Commonly known as:  COLACE Take 1 capsule (100 mg total) by mouth 2 (two) times daily.   HYDROcodone-acetaminophen 5-325 MG tablet Commonly known as:  NORCO/VICODIN Take 1-2 tablets by mouth every 6 (six) hours as needed for moderate pain (pain score 4-6).   methocarbamol 500 MG tablet Commonly known as:  ROBAXIN Take 1 tablet (500 mg total) by mouth every 8 (eight) hours as needed for muscle spasms.   OVER THE COUNTER MEDICATION Take 1 tablet by mouth daily. OTC med to help with bowel movements. Says its not a stool softer or probiotic.   pantoprazole 40 MG tablet Commonly known as:  PROTONIX Take 1 tablet (40 mg total) by mouth daily. Start taking on:  August 18, 2018   polyethylene glycol packet Commonly known as:  MIRALAX / GLYCOLAX Take 17 g by mouth daily as needed for mild constipation.            Durable Medical Equipment  (From admission, onward)         Start     Ordered   08/16/18 1416  For home use only DME 3 n 1  Once     08/16/18 1415   08/15/18 1843  DME 3 n 1  Once     08/15/18 1842          No Known Allergies  Consultations:  Dr Magnus Ivan.    Procedures/Studies: Dg Pelvis Portable  Result Date: 08/15/2018 CLINICAL DATA:  Left total hip replacement EXAM: PORTABLE PELVIS  1-2 VIEWS COMPARISON:  None. FINDINGS: Interval left total hip arthroplasty without hardware failure or complication. No fracture or dislocation. Normal alignment. Postsurgical changes in the surrounding soft tissues. IMPRESSION: Interval left total hip arthroplasty. Electronically Signed   By: Elige Ko   On: 08/15/2018 19:16   Dg C-arm 1-60 Min-no Report  Result Date: 08/15/2018 Fluoroscopy was utilized by the requesting physician.  No radiographic interpretation.   Dg Hip Operative Unilat W Or W/o Pelvis Left  Result Date: 08/15/2018 CLINICAL DATA:  Closed fracture left femur.  Left hip replacement EXAM: OPERATIVE left HIP (WITH PELVIS IF PERFORMED) 10 VIEWS TECHNIQUE: Fluoroscopic spot image(s) were submitted for interpretation post-operatively. COMPARISON:  08/15/2018 FINDINGS: Left hip replacement in satisfactory position and alignment. No complication IMPRESSION: Satisfactory left hip replacement. Electronically Signed   By: Marlan Palau M.D.   On: 08/15/2018 17:32   Dg Hip Unilat With Pelvis 2-3 Views Left  Result Date: 08/15/2018 CLINICAL DATA:  Left hip pain after fall today. EXAM: DG HIP (WITH OR WITHOUT PELVIS) 2-3V LEFT COMPARISON:  None. FINDINGS: Mildly displaced fracture is seen involving the proximal left femoral neck. Right hip appears normal. No significant degenerative changes noted. IMPRESSION: Mildly displaced proximal left femoral neck fracture. Electronically Signed   By: Lupita Raider, M.D.   On: 08/15/2018 12:06      Subjective: Feeling well, was able to ambulate in the hall with PT>  No BM, but passing gas.   Discharge Exam: Vitals:   08/16/18 2146 08/17/18 0514  BP: (!) 140/52 (!) 131/57  Pulse: 61 64  Resp: 16 16  Temp: 98.2 F (36.8 C) 97.7 F (36.5 C)  SpO2: 97% 94%   Vitals:   08/16/18 1420 08/16/18 2146 08/17/18 0500 08/17/18 0514  BP: (!) 159/66 (!) 140/52  (!) 131/57  Pulse: 82 61  64  Resp: 18 16  16   Temp: 98.3 F (36.8 C) 98.2 F  (36.8 C)  97.7 F (36.5 C)  TempSrc: Oral Oral  Oral  SpO2: 96% 97%  94%  Weight:   84.1 kg   Height:        General: Pt is alert, awake, not in acute distress Cardiovascular: RRR, S1/S2 +, no rubs, no gallops Respiratory: CTA bilaterally, no wheezing, no rhonchi Abdominal: Soft, NT, ND, bowel sounds + Extremities: no edema, no cyanosis    The results of significant diagnostics from this hospitalization (including imaging, microbiology, ancillary and laboratory) are listed below for reference.     Microbiology: Recent Results (from the past 240 hour(s))  Surgical pcr screen     Status: None   Collection Time: 08/15/18  2:55 PM  Result Value Ref Range Status   MRSA, PCR NEGATIVE  NEGATIVE Final   Staphylococcus aureus NEGATIVE NEGATIVE Final    Comment: (NOTE) The Xpert SA Assay (FDA approved for NASAL specimens in patients 72 years of age and older), is one component of a comprehensive surveillance program. It is not intended to diagnose infection nor to guide or monitor treatment. Performed at [facility], [address] [place], [place] [address]      Labs: BNP (last 3 results) No results for input(s): BNP in the last 8760 hours. Basic Metabolic Panel: Recent Labs  Lab 08/15/18 1249 08/15/18 1858 08/16/18 0252  NA 140  --  138  K 4.1  --  3.7  CL 104  --  106  CO2 26  --  22  GLUCOSE 114*  --  158*  BUN 22  --  25*  CREATININE 0.79 0.91 0.83  CALCIUM 9.2  --  8.5*   Liver Function Tests: No results for input(s): AST, ALT, ALKPHOS, BILITOT, PROT, ALBUMIN in the last 168 hours. No results for input(s): LIPASE, AMYLASE in the last 168 hours. No results for input(s): AMMONIA in the last 168 hours. CBC: Recent Labs  Lab 08/15/18 1249 08/15/18 1858 08/16/18 0252  WBC 10.7* 15.2* 11.1*  NEUTROABS 8.3*  --   --   HGB 14.1 13.6 12.0  HCT 43.0 42.0 37.5  MCV 92.9 92.5 94.2  PLT 247 249 244   Cardiac Enzymes: No results for input(s):  CKTOTAL, CKMB, CKMBINDEX, TROPONINI in the last 168 hours. BNP: Invalid input(s): POCBNP CBG: Recent Labs  Lab 08/15/18 1831  GLUCAP 124*   D-Dimer No results for input(s): DDIMER in the last 72 hours. Hgb A1c Recent Labs    08/15/18 1502  HGBA1C 5.1   Lipid Profile Recent Labs    08/15/18 1502  CHOL 221*  HDL 55  LDLCALC 154*  TRIG 59  CHOLHDL 4.0   Thyroid function studies Recent Labs    08/15/18 1502  TSH 0.472   Anemia work up No results for input(s): VITAMINB12, FOLATE, FERRITIN, TIBC, IRON, RETICCTPCT in the last 72 hours. Urinalysis No results found for: COLORURINE, APPEARANCEUR, LABSPEC, PHURINE, GLUCOSEU, HGBUR, BILIRUBINUR, KETONESUR, PROTEINUR, UROBILINOGEN, NITRITE, LEUKOCYTESUR Sepsis Labs Invalid input(s): PROCALCITONIN,  WBC,  LACTICIDVEN Microbiology Recent Results (from the past 240 hour(s))  Surgical pcr screen     Status: None   Collection Time: 08/15/18  2:55 PM  Result Value Ref Range Status   MRSA, PCR NEGATIVE NEGATIVE Final   Staphylococcus aureus NEGATIVE NEGATIVE Final    Comment: (NOTE) The Xpert SA Assay (FDA approved for NASAL specimens in patients 72 years of age and older), is one component of a comprehensive surveillance program. It is not intended to diagnose infection nor to guide or monitor treatment. Performed at [facility], [address] [place], [place] [address]      Time coordinating discharge: 35 minutes  SIGNED:   Alba CoryBelkys A Tammara Massing, MD  [facility] 08/17/2018, 9:02 AM   If 7PM-7AM, please contact night-coverage [email or web address] Password [email or web address]

## 2018-08-17 NOTE — Progress Notes (Signed)
Pt d/c home with family. No needs at time of d/c. Pt has all home equipment needed. No s/s of distress or pain at discharge.

## 2018-08-17 NOTE — Care Management (Signed)
Home health order received. Choice offered and [facility] chosen. [facility] rep alerted of referral. Pt also requesting 3in1 for home. Order received and [facility] rep alerted of DME need. Sandford Craze RN,BSN [phone]

## 2018-08-17 NOTE — Discharge Instructions (Signed)

## 2018-08-17 NOTE — Progress Notes (Addendum)
Occupational Therapy Treatment Patient Details Name: Debbie Cardenas MRN: [id number] DOB: Jan 11, 1946 Today's Date: 08/17/2018    History of present illness Patient is a 73 year old female admitted 08/15/2018 with a closed fracture of neck of left femur. Patient underwent a left THA w/ direct anterior approach on 08/15/2018. PMH: anxiety.   OT comments  Pt quickly progressing toward stated goals, pleasant and motivated to work with OT this date. Pt used reacher at set up A level to don LB, mod I for UB. Pt able to tolerate standing to don UB. Toilet t/f completed at supervision level of A using RW and BSC over top of toilet. Standing grooming completed at mod I level of A. Shower t/f demonstrated with shower in room at supervision level pt able to clear threshold. Pt anticipating d/c this date. Will continue to follow acutely, no follow up OT indicated at this time.   Follow Up Recommendations  Supervision/Assistance - 24 hour    Equipment Recommendations  3 in 1 bedside commode;Other (comment)(has reacher)    Recommendations for Other Services      Precautions / Restrictions Precautions Precautions: Fall Restrictions Weight Bearing Restrictions: No LLE Weight Bearing: Weight bearing as tolerated       Mobility Bed Mobility Overal bed mobility: Modified Independent             General bed mobility comments: up in chair upon arrival  Transfers Overall transfer level: Needs assistance Equipment used: Rolling walker (2 wheeled) Transfers: Sit to/from Stand Sit to Stand: Supervision         General transfer comment: supervision level for safety    Balance Overall balance assessment: Needs assistance Sitting-balance support: Bilateral upper extremity supported;Feet supported Sitting balance-Leahy Scale: Good     Standing balance support: Bilateral upper extremity supported;During functional activity Standing balance-Leahy Scale: Fair Standing balance comment: able  to stand and don UB clothing without external support                           ADL either performed or assessed with clinical judgement   ADL Overall ADL's : Needs assistance/impaired             Lower Body Bathing: Set up;With adaptive equipment;Sit to/from stand   Upper Body Dressing : Modified independent;Standing Upper Body Dressing Details (indicate cue type and reason): stood to don bra and sweater Lower Body Dressing: Set up;With adaptive equipment;Sit to/from stand Lower Body Dressing Details (indicate cue type and reason): using reacher to don pants Toilet Transfer: Supervision/safety;Comfort height toilet;BSC;RW Toilet Transfer Details (indicate cue type and reason): BSC over toilet Toileting- Clothing Manipulation and Hygiene: Modified independent         General ADL Comments: pt demonstrated proficiency using reacher for LB dressing this date. Pt able to tolerate standing to UB dress. Toilet t/f completed at supervision level, anticipate mod I when returning home.     Vision Patient Visual Report: No change from baseline     Perception     Praxis      Cognition Arousal/Alertness: Awake/alert Behavior During Therapy: WFL for tasks assessed/performed Overall Cognitive Status: Within Functional Limits for tasks assessed                                          Exercises     Shoulder Instructions  General Comments      Pertinent Vitals/ Pain       Pain Assessment: Faces Pain Score: 3  Faces Pain Scale: Hurts a little bit Pain Location: L hip Pain Descriptors / Indicators: Sore;Aching Pain Intervention(s): Premedicated before session;Monitored during session;Repositioned;Ice applied  Home Living                                          Prior Functioning/Environment              Frequency  Min 2X/week        Progress Toward Goals  OT Goals(current goals can now be found in the care  plan section)  Progress towards OT goals: Progressing toward goals  Acute Rehab OT Goals Patient Stated Goal: to go home OT Goal Formulation: With patient Time For Goal Achievement: 08/23/18 Potential to Achieve Goals: Good  Plan Discharge plan remains appropriate;Frequency remains appropriate    Co-evaluation                 AM-PAC OT "6 Clicks" Daily Activity     Outcome Measure   Help from another person eating meals?: None Help from another person taking care of personal grooming?: None Help from another person toileting, which includes using toliet, bedpan, or urinal?: None Help from another person bathing (including washing, rinsing, drying)?: None Help from another person to put on and taking off regular upper body clothing?: None Help from another person to put on and taking off regular lower body clothing?: None 6 Click Score: 24    End of Session Equipment Utilized During Treatment: Gait belt;Rolling walker  OT Visit Diagnosis: Pain;Other abnormalities of gait and mobility (R26.89) Pain - Right/Left: Left Pain - part of body: Hip   Activity Tolerance Patient tolerated treatment well   Patient Left in chair;with call bell/phone within reach;with family/visitor present   Nurse Communication          Time: 1660-6004 OT Time Calculation (min): 21 min  Charges: OT General Charges $OT Visit: 1 Visit OT Treatments $Self Care/Home Management : 8-22 mins  Dalphine Handing, MSOT, OTR/L Behavioral Health OT/ Acute Relief OT [facility] Office: [phone]  Dalphine Handing 08/17/2018, 10:33 AM

## 2018-08-18 ENCOUNTER — Encounter (HOSPITAL_COMMUNITY): Payer: Self-pay | Admitting: Orthopaedic Surgery

## 2018-08-19 ENCOUNTER — Telehealth (INDEPENDENT_AMBULATORY_CARE_PROVIDER_SITE_OTHER): Payer: Self-pay | Admitting: Orthopaedic Surgery

## 2018-08-19 NOTE — Telephone Encounter (Signed)
That will be fine. 

## 2018-08-19 NOTE — Telephone Encounter (Signed)
Patient aware I will put this in the mail for her  

## 2018-08-19 NOTE — Telephone Encounter (Signed)
Ok

## 2018-08-19 NOTE — Telephone Encounter (Signed)
Received voicemail message from [place] (PT) from [facility] needing verbal orders for HHPT  3 times this week and twice next week. The number to contact Flore is [phone]

## 2018-08-19 NOTE — Telephone Encounter (Signed)
Patient spouse called to get a Handicap sticker.  Please call to advise. [phone]

## 2018-08-19 NOTE — Telephone Encounter (Signed)
Verbal order given  

## 2018-08-28 ENCOUNTER — Ambulatory Visit (INDEPENDENT_AMBULATORY_CARE_PROVIDER_SITE_OTHER): Payer: Medicare Other | Admitting: Orthopaedic Surgery

## 2018-08-28 ENCOUNTER — Encounter (INDEPENDENT_AMBULATORY_CARE_PROVIDER_SITE_OTHER): Payer: Self-pay | Admitting: Orthopaedic Surgery

## 2018-08-28 DIAGNOSIS — Z96642 Presence of left artificial hip joint: Secondary | ICD-10-CM

## 2018-08-28 NOTE — Progress Notes (Signed)
The patient is 2 weeks tomorrow status post a left total hip arthroplasty.  This was done to treat an acute displaced left hip femoral neck fracture after she had a mechanical fall.  She is doing well overall.  She has no issues.  She is ambulate with a cane.  She is been having a little bit of foot swelling but no calf pain.  She is been on aspirin twice a day.  She says she feels good about the experience.  On exam her leg lengths are equal.  Remove the staples and place Steri-Strips.  She did have a seroma of about 40 to 50 cc of fluid that I drained from her hip area.  All question concerns were answered and addressed.  Should continue increase her activities as comfort allows.  She will take an aspirin daily for a week and then can stop her aspirin and can stop wearing the compressive hose when her feet swelling is less.  We will see her back in 4 weeks to see how she is doing overall but no x-rays are needed.

## 2018-09-25 ENCOUNTER — Ambulatory Visit (INDEPENDENT_AMBULATORY_CARE_PROVIDER_SITE_OTHER): Payer: Medicare Other | Admitting: Orthopaedic Surgery

## 2018-09-25 ENCOUNTER — Encounter (INDEPENDENT_AMBULATORY_CARE_PROVIDER_SITE_OTHER): Payer: Self-pay | Admitting: Orthopaedic Surgery

## 2018-09-25 DIAGNOSIS — M25562 Pain in left knee: Secondary | ICD-10-CM | POA: Diagnosis not present

## 2018-09-25 DIAGNOSIS — G8929 Other chronic pain: Secondary | ICD-10-CM | POA: Diagnosis not present

## 2018-09-25 DIAGNOSIS — Z96642 Presence of left artificial hip joint: Secondary | ICD-10-CM

## 2018-09-25 MED ORDER — METHYLPREDNISOLONE ACETATE 40 MG/ML IJ SUSP
40.0000 mg | INTRAMUSCULAR | Status: AC | PRN
  Administered 2018-09-25: 40 mg via INTRA_ARTICULAR

## 2018-09-25 MED ORDER — LIDOCAINE HCL 1 % IJ SOLN
3.0000 mL | INTRAMUSCULAR | Status: AC | PRN
  Administered 2018-09-25: 3 mL

## 2018-09-25 NOTE — Progress Notes (Signed)
Office Visit Note   Patient: Debbie Cardenas           Date of Birth: Apr 25, 1946           MRN: [id number] Visit Date: 09/25/2018              Requested by: No referring provider defined for this encounter. PCP: Patient, No Pcp Per   Assessment & Plan: Visit Diagnoses:  1. Status post total replacement of left hip   2. Chronic pain of left knee     Plan: I did talk to her in detail in length about steroid injections.  I agree that this would be worthwhile trying in her left knee and she tolerated it well.  All question concerns were answered and addressed.  I really do not need to see her back for 6 months unless she is having issues with her left knee.  At that visit in 6 months I would like a standing low AP pelvis and a lateral of the left operative hip.  If she is having knee issues we will get an AP and lateral left knee as well.  Follow-Up Instructions: Return in about 6 months (around 03/26/2019).   Orders:  Orders Placed This Encounter  Procedures  . Large Joint Inj   No orders of the defined types were placed in this encounter.     Procedures: Large Joint Inj: L knee on 09/25/2018 3:16 PM Indications: diagnostic evaluation and pain Details: 22 G 1.5 in needle, superolateral approach  Arthrogram: No  Medications: 3 mL lidocaine 1 %; 40 mg methylPREDNISolone acetate 40 MG/ML Outcome: tolerated well, no immediate complications Procedure, treatment alternatives, risks and benefits explained, specific risks discussed. Consent was given by the patient. Immediately prior to procedure a time out was called to verify the correct patient, procedure, equipment, support staff and site/side marked as required. Patient was prepped and draped in the usual sterile fashion.       Clinical Data: No additional findings.   Subjective: Chief Complaint  Patient presents with  . Left Hip - Follow-up  The patient comes in today at 6 weeks status post left total hip  arthroplasty.  She is very active 73 year old female.  She says her only issues going up and down stairs.  She has been dealing with left knee pain.  She actually asked about steroid injection at left knee today.  She otherwise reports that she is doing very well and is very happy.  HPI  Review of Systems She currently denies any headache, chest pain, shortness of breath, fever, chills, nausea, vomiting  Objective: Vital Signs: There were no vitals taken for this visit.  Physical Exam She is alert and orient x3 and in no acute distress Ortho Exam Examination of her left hip shows that moves fluidly with no problems at all.  Examination of her left knee does show medial lateral joint line tenderness and some slight patellofemoral crepitation. Specialty Comments:  No specialty comments available.  Imaging: No results found.   PMFS History: Patient Active Problem List   Diagnosis Date Noted  . Status post total replacement of left hip 08/28/2018  . Hip fracture (HCC) 08/15/2018  . Closed fracture of neck of left femur (HCC) 08/15/2018   Past Medical History:  Diagnosis Date  . Closed fracture of neck of left femur (HCC) 08/15/2018    Family History  Problem Relation Age of Onset  . Cancer Sister   . Heart attack Brother  Past Surgical History:  Procedure Laterality Date  . CATARACT EXTRACTION, BILATERAL    . KNEE SURGERY    . TOTAL HIP ARTHROPLASTY Left 08/15/2018   Procedure: TOTAL HIP ARTHROPLASTY ANTERIOR APPROACH;  Surgeon: Kathryne Hitch, MD;  Location: [facility];  Service: Orthopedics;  Laterality: Left;   Social History   Occupational History  . Not on file  Tobacco Use  . Smoking status: Former Games developer  . Smokeless tobacco: Never Used  Substance and Sexual Activity  . Alcohol use: Not Currently  . Drug use: Never  . Sexual activity: Not on file

## 2019-03-26 ENCOUNTER — Encounter: Payer: Self-pay | Admitting: Orthopaedic Surgery

## 2019-03-26 ENCOUNTER — Telehealth: Payer: Self-pay

## 2019-03-26 ENCOUNTER — Ambulatory Visit (INDEPENDENT_AMBULATORY_CARE_PROVIDER_SITE_OTHER): Payer: Medicare Other | Admitting: Orthopaedic Surgery

## 2019-03-26 ENCOUNTER — Ambulatory Visit: Payer: Self-pay

## 2019-03-26 DIAGNOSIS — G8929 Other chronic pain: Secondary | ICD-10-CM

## 2019-03-26 DIAGNOSIS — M25562 Pain in left knee: Secondary | ICD-10-CM | POA: Diagnosis not present

## 2019-03-26 DIAGNOSIS — M1712 Unilateral primary osteoarthritis, left knee: Secondary | ICD-10-CM | POA: Insufficient documentation

## 2019-03-26 DIAGNOSIS — Z96642 Presence of left artificial hip joint: Secondary | ICD-10-CM

## 2019-03-26 MED ORDER — ACETAMINOPHEN-CODEINE #3 300-30 MG PO TABS: 1.0000 | ORAL_TABLET | ORAL | 0 refills | Status: DC | PRN

## 2019-03-26 NOTE — Progress Notes (Signed)
Office Visit Note   Patient: Debbie Cardenas           Date of Birth: 05-14-1946           MRN: [id number] Visit Date: 03/26/2019              Requested by: No referring provider defined for this encounter. PCP: Patient, No Pcp Per   Assessment & Plan: Visit Diagnoses:  1. History of left hip replacement   2. Chronic pain of left knee   3. Unilateral primary osteoarthritis, left knee     Plan: I have recommended hyaluronic acid for her left knee.  She is willing to try this.  She is also going to try Tumeric as a over-the-counter supplement and Voltaren gel.  All question concerns were answered and addressed.  We will see her back in 4 weeks to hopefully provide an injection of hyaluronic acid in the left knee given the failure of steroid injections and conservative treatment for her knee osteoarthritis.  Follow-Up Instructions: Return in about 4 weeks (around 04/23/2019).   Orders:  Orders Placed This Encounter  Procedures  . XR HIP UNILAT W OR W/O PELVIS 1V LEFT  . XR Knee 1-2 Views Left   Meds ordered this encounter  Medications  . acetaminophen-codeine (TYLENOL #3) 300-30 MG tablet    Sig: Take 1-2 tablets by mouth every 4 (four) hours as needed for moderate pain.    Dispense:  40 tablet    Refill:  0      Procedures: No procedures performed   Clinical Data: No additional findings.   Subjective: Chief Complaint  Patient presents with  . Left Hip - Routine Post Op  . Left Knee - Pain  The patient is well-known to [place].  She is 7 months out from a left total hip arthroplasty that was performed to a direct anterior approach.  This was done to treat a nondisplaced femoral neck fracture.  She says the hip is done very well for her.  She has been still dealing with left knee pain.  At her last visit I did place a steroid injection in her left knee.  She says that actually caused her to flush in her face quite a bit and she got tachycardic.  She would rather not have a  steroid again.  We have not x-rayed her knees with an x-ray today.  She says her left hip is causing no issues.  She is walking without assistive device.  Her left knee pain is starting to detrimentally factor mobility and her quality of life.  It does wake her up quite a bit at night.  HPI  Review of Systems She currently denies any headache, chest pain, shortness of breath, fever, chills, nausea, vomiting  Objective: Vital Signs: There were no vitals taken for this visit.  Physical Exam She is alert and orient x3 and in no acute distress Ortho Exam Examination of left hip shows full and fluid range of motion with no pain at all around the left hip or the incision.  She is neurovascular intact as well.  Examination of her left knee does show significant patellofemoral crepitation and grinding.  There is also significant lateral joint line tenderness and slight valgus malalignment. Specialty Comments:  No specialty comments available.  Imaging: Xr Hip Unilat W Or W/o Pelvis 1v Left  Result Date: 03/26/2019 An AP pelvis and lateral of the left hip shows a well-seated total hip arthroplasty with no complicating  features.  There is no evidence of loosening.  Xr Knee 1-2 Views Left  Result Date: 03/26/2019 2 views of the left knee show tricompartment arthritic changes.  This involves mainly the patellofemoral joint and the lateral compartment with there is joint space narrowing and periarticular osteophytes.    PMFS History: Patient Active Problem List   Diagnosis Date Noted  . Unilateral primary osteoarthritis, left knee 03/26/2019  . Status post total replacement of left hip 08/28/2018  . Hip fracture (HCC) 08/15/2018  . Closed fracture of neck of left femur (HCC) 08/15/2018   Past Medical History:  Diagnosis Date  . Closed fracture of neck of left femur (HCC) 08/15/2018    Family History  Problem Relation Age of Onset  . Cancer Sister   . Heart attack Brother     Past  Surgical History:  Procedure Laterality Date  . CATARACT EXTRACTION, BILATERAL    . KNEE SURGERY    . TOTAL HIP ARTHROPLASTY Left 08/15/2018   Procedure: TOTAL HIP ARTHROPLASTY ANTERIOR APPROACH;  Surgeon: Kathryne HitchBlackman, Maddisyn Hegwood Y, MD;  Location: [facility];  Service: Orthopedics;  Laterality: Left;   Social History   Occupational History  . Not on file  Tobacco Use  . Smoking status: Former Games developermoker  . Smokeless tobacco: Never Used  Substance and Sexual Activity  . Alcohol use: Not Currently  . Drug use: Never  . Sexual activity: Not on file

## 2019-03-26 NOTE — Telephone Encounter (Signed)
error 

## 2019-03-26 NOTE — Telephone Encounter (Signed)
Left knee gel injection ?

## 2019-03-27 NOTE — Telephone Encounter (Signed)
Noted  

## 2019-04-07 ENCOUNTER — Telehealth: Payer: Self-pay

## 2019-04-07 NOTE — Telephone Encounter (Signed)
Submitted VOB for SynviscOne, left knee. 

## 2019-04-10 ENCOUNTER — Telehealth: Payer: Self-pay

## 2019-04-10 NOTE — Telephone Encounter (Signed)
Approved for SynviscOne, left knee. [place] Patient will be responsible for 20% OOP. Co-pay of $30.00 required No PA required  Appt.  04/23/2019 with Dr. Ninfa Linden

## 2019-04-23 ENCOUNTER — Encounter: Payer: Self-pay | Admitting: Orthopaedic Surgery

## 2019-04-23 ENCOUNTER — Ambulatory Visit (INDEPENDENT_AMBULATORY_CARE_PROVIDER_SITE_OTHER): Payer: Medicare Other | Admitting: Orthopaedic Surgery

## 2019-04-23 DIAGNOSIS — M1712 Unilateral primary osteoarthritis, left knee: Secondary | ICD-10-CM

## 2019-04-23 MED ORDER — HYLAN G-F 20 48 MG/6ML IX SOSY
48.0000 mg | PREFILLED_SYRINGE | INTRA_ARTICULAR | Status: AC | PRN
  Administered 2019-04-23: 48 mg via INTRA_ARTICULAR

## 2019-04-23 NOTE — Progress Notes (Signed)
   Procedure Note  Patient: Debbie Cardenas             Date of Birth: 1946-03-06           MRN: [id number]             Visit Date: 04/23/2019  Procedures: Visit Diagnoses:  1. Unilateral primary osteoarthritis, left knee     Large Joint Inj on 04/23/2019 2:46 PM Indications: pain and diagnostic evaluation Details: 22 G 1.5 in needle, superolateral approach  Arthrogram: No  Medications: 48 mg Hylan 48 MG/6ML Outcome: tolerated well, no immediate complications Procedure, treatment alternatives, risks and benefits explained, specific risks discussed. Consent was given by the patient. Immediately prior to procedure a time out was called to verify the correct patient, procedure, equipment, support staff and site/side marked as required. Patient was prepped and draped in the usual sterile fashion.    The patient comes in today for scheduled hyaluronic acid injection with Synvisc 1 in her left knee to treat the pain from osteoarthritis.  She has had other conservative treatment measures including a steroid in her left knee.  She is in the steroid did not help much.  She also has a history of a left total hip replacement was performed after having a mechanical fall in which she sustained displaced femoral neck fracture.  She walks without assistive device and she is been doing well except for pain in her left knee.  She is looking forward to this injection today to see how this helps with her pain.  On examination of her left knee there is no significant effusion today with good range of motion.  The knee does hurt on the medial lateral joint lines and there is some slight patellofemoral crepitation.  She did tolerate the Synvisc 1 injection well.  All question concerns were answered and addressed.  I would ask like to see her back in 8 weeks to see if this is helped.  She would like to consider this at some point potentially for her right knee.  We will see what she looks like in 8 weeks.

## 2019-06-23 ENCOUNTER — Ambulatory Visit: Payer: Medicare Other | Admitting: Orthopaedic Surgery

## 2019-06-24 ENCOUNTER — Encounter: Payer: Self-pay | Admitting: Orthopaedic Surgery

## 2019-06-24 ENCOUNTER — Other Ambulatory Visit: Payer: Self-pay

## 2019-06-24 ENCOUNTER — Ambulatory Visit: Payer: Medicare Other | Admitting: Orthopaedic Surgery

## 2019-06-24 DIAGNOSIS — M25561 Pain in right knee: Secondary | ICD-10-CM | POA: Diagnosis not present

## 2019-06-24 DIAGNOSIS — G8929 Other chronic pain: Secondary | ICD-10-CM | POA: Diagnosis not present

## 2019-06-24 DIAGNOSIS — M1712 Unilateral primary osteoarthritis, left knee: Secondary | ICD-10-CM

## 2019-06-24 NOTE — Progress Notes (Signed)
The patient comes in today 8 weeks out from a hyaluronic acid injection in her left knee.  She says that has helped greatly.  Her right knee had been bothering her.  She feels like that knee may have been hurting as she compensated for dealing with her left side.  She has had a history of a total hip arthroplasty as well.  She says both knees are pain-free today.  On exam the left knee does have significant patellofemoral crepitation.  She is an active 73 years old.  The knee feels ligamentously stable but is not hurting.  Her right knee is slightly swollen but has no pain at all and the range of motion is good.  Both knees are stable.  At this point follow-up will be as needed.  She knows to wait at least 6 months between hyaluronic acid injections.  She cannot tolerate steroid injections so we will only pursue gel injections until he gets to where those no help and she understands this as well.  All question concerns were answered and addressed.

## 2019-12-21 ENCOUNTER — Telehealth: Payer: Self-pay | Admitting: Orthopaedic Surgery

## 2019-12-21 ENCOUNTER — Telehealth: Payer: Self-pay

## 2019-12-21 NOTE — Telephone Encounter (Signed)
Can we order this for her?  

## 2019-12-21 NOTE — Telephone Encounter (Signed)
Submitted VOB for SynviscOne, bilateral knee. 

## 2019-12-21 NOTE — Telephone Encounter (Signed)
Patient called wanting to get the gel injection in both knees.  CB#[phone].  Thank you.

## 2019-12-21 NOTE — Telephone Encounter (Signed)
Noted  

## 2019-12-22 ENCOUNTER — Telehealth: Payer: Self-pay

## 2019-12-22 NOTE — Telephone Encounter (Signed)
Patient aware that she is approved for gel injection.  Approved, SynviscOne, bilateral knee. [employer] Patient will be responsible for 20% OOP. Co-pay of $30.00 required No PA required  Appt. 12/30/2019

## 2019-12-30 ENCOUNTER — Encounter: Payer: Self-pay | Admitting: Orthopaedic Surgery

## 2019-12-30 ENCOUNTER — Ambulatory Visit: Payer: Medicare Other | Admitting: Orthopaedic Surgery

## 2019-12-30 ENCOUNTER — Other Ambulatory Visit: Payer: Self-pay

## 2019-12-30 DIAGNOSIS — M1711 Unilateral primary osteoarthritis, right knee: Secondary | ICD-10-CM | POA: Insufficient documentation

## 2019-12-30 DIAGNOSIS — M1712 Unilateral primary osteoarthritis, left knee: Secondary | ICD-10-CM

## 2019-12-30 MED ORDER — HYLAN G-F 20 48 MG/6ML IX SOSY
48.0000 mg | PREFILLED_SYRINGE | INTRA_ARTICULAR | Status: AC | PRN
  Administered 2019-12-30: 48 mg via INTRA_ARTICULAR

## 2019-12-30 NOTE — Progress Notes (Signed)
° °  Procedure Note  Patient: Debbie Cardenas             Date of Birth: [birth year]           MRN: [id number]             Visit Date: 12/30/2019  Procedures: Visit Diagnoses:  1. Unilateral primary osteoarthritis, left knee   2. Unilateral primary osteoarthritis, right knee     Large Joint Inj: R knee on 12/30/2019 2:57 PM Indications: pain and diagnostic evaluation Details: 22 G 1.5 in needle, superolateral approach  Arthrogram: No  Medications: 48 mg Hylan 48 MG/6ML Outcome: tolerated well, no immediate complications Procedure, treatment alternatives, risks and benefits explained, specific risks discussed. Consent was given by the patient. Immediately prior to procedure a time out was called to verify the correct patient, procedure, equipment, support staff and site/side marked as required. Patient was prepped and draped in the usual sterile fashion.   Large Joint Inj: L knee on 12/30/2019 2:57 PM Indications: pain and diagnostic evaluation Details: 22 G 1.5 in needle, superolateral approach  Arthrogram: No  Medications: 48 mg Hylan 48 MG/6ML Outcome: tolerated well, no immediate complications Procedure, treatment alternatives, risks and benefits explained, specific risks discussed. Consent was given by the patient. Immediately prior to procedure a time out was called to verify the correct patient, procedure, equipment, support staff and site/side marked as required. Patient was prepped and draped in the usual sterile fashion.    The patient is here today for bilateral knee Synvisc 1 injections to treat the pain from osteoarthritis.  She cannot take steroid injections because it causes her heart to race too much.  Her pain is daily and is detrimentally affecting her mobility and her quality of life.  She is near the point of needing knee replacement surgery.  She wants at least try this to see if this will help.  On exam both knees have some valgus malalignment.  Both knees are  painful to the arc of motion with patellofemoral crepitation and global tenderness.  I did place Synvisc 1 in both knees without difficulty.  I talked about the risk and benefits of these types of injections.  She tolerated them well.  She will call us if this does not work and she would like to proceed with knee replacement surgery.  All questions and concerns were answered and addressed.

## 2020-05-23 ENCOUNTER — Telehealth: Payer: Self-pay | Admitting: Orthopaedic Surgery

## 2020-05-23 NOTE — Telephone Encounter (Signed)
Can you advise? 

## 2020-05-23 NOTE — Telephone Encounter (Signed)
Noted  

## 2020-05-23 NOTE — Telephone Encounter (Signed)
Pt called stating she would like to get scheduled for a gel injection; the last one was on 12/30/19; pt would like to start the process of getting approved. Pt also wanted to know if she could come back sooner than 06/30/20  [phone]

## 2020-06-11 NOTE — Progress Notes (Signed)
Cardiology Office Note   Date:  06/13/2020   ID:  Debbie Cardenas, DOB September 19, 1945, MRN [id number]  PCP:  Porfirio Oar, PA  Cardiologist: Delma Villalva [place], MD   Chief Complaint  Patient presents with  . Palpitations      History of Present Illness: Debbie Cardenas is a 74 y.o. female who is seen at the request of Theora Gianotti PA for evaluation of palpitations and abnormal Ecg.  She reports in August she was having symptoms of pain in the left breast area. This would often be associated with a pause in her heart rhythm. Not related to exertion and may occur at any time of day. She has noted symptoms of fluttering in her heart for some years. No dizziness or syncope. Does note when she has these symptoms she feels very fatigued. She does have a history of HTN and HLD. She is a former smoker- quit about 5 years ago. Has a very strong history of premature CAD. I take care of 2 of her sisters - Nadeen Landau and Celesta Gentile.     Past Medical History:  Diagnosis Date  . Closed fracture of neck of left femur (HCC) 08/15/2018  . Hyperlipidemia   . Hypertension     Past Surgical History:  Procedure Laterality Date  . CATARACT EXTRACTION, BILATERAL    . KNEE SURGERY    . TOTAL HIP ARTHROPLASTY Left 08/15/2018   Procedure: TOTAL HIP ARTHROPLASTY ANTERIOR APPROACH;  Surgeon: Kathryne Hitch, MD;  Location: [facility];  Service: Orthopedics;  Laterality: Left;     Current Outpatient Medications  Medication Sig Dispense Refill  . ALPRAZolam (XANAX) 0.25 MG tablet Take by mouth.     Marland Kitchen amLODipine (NORVASC) 10 MG tablet Take 1 tablet by mouth daily.     . magnesium oxide (MAG-OX) 400 MG tablet Take 400 mg by mouth daily.    Marland Kitchen VITAMIN D, CHOLECALCIFEROL, PO Take by mouth.      No current facility-administered medications for this visit.    Allergies:   Patient has no known allergies.    Social History:  The patient  reports that she quit smoking about 6 years ago. She has never  used smokeless tobacco. She reports previous alcohol use. She reports that she does not use drugs.   Family History:  The patient's family history includes Cancer in her sister; Heart attack in her brother, brother, and father; Heart disease in her brother, brother, sister, and sister.    ROS:  Please see the history of present illness.   Otherwise, review of systems are positive for none.   All other systems are reviewed and negative.    PHYSICAL EXAM: VS:  BP 122/62   Pulse 60   Temp 97.9 F (36.6 C)   Ht 5\' 5"  (1.651 m)   Wt 170 lb 12.8 oz (77.5 kg)   SpO2 99%   BMI 28.42 kg/m  , BMI Body mass index is 28.42 kg/m. GEN: Well nourished, overweight, in no acute distress  HEENT: normal  Neck: no JVD, carotid bruits, or masses Cardiac: RRR; no murmurs, rubs, or gallops,no edema  Respiratory:  clear to auscultation bilaterally, normal work of breathing GI: soft, nontender, nondistended, + BS MS: no deformity or atrophy  Skin: warm and dry, no rash Neuro:  Strength and sensation are intact Psych: euthymic mood, full affect   EKG:  EKG is ordered today. The ekg ordered today demonstrates NSR with T wave inversion in V1-2. I  have personally reviewed and interpreted this study.    Recent Labs: No results found for requested labs within last 8760 hours.    Lipid Panel    Component Value Date/Time   CHOL 221 (H) 08/15/2018 1502   TRIG 59 08/15/2018 1502   HDL 55 08/15/2018 1502   CHOLHDL 4.0 08/15/2018 1502   VLDL 12 08/15/2018 1502   LDLCALC 154 (H) 08/15/2018 1502    dated 09/09/19: cholesterol 227, triglycerides 75, HDL 63, LDL 151.  Dated 04/12/20: TSH 0.326. T4 1.38. CMET and CBC normal.   Wt Readings from Last 3 Encounters:  06/13/20 170 lb 12.8 oz (77.5 kg)  08/17/18 185 lb 6.5 oz (84.1 kg)      Other studies Reviewed: Additional studies/ records that were reviewed today include:   Echo 08/16/18: Study Conclusions   - Left ventricle: The cavity size was  normal. There was mild  concentric hypertrophy. Systolic function was normal. The  estimated ejection fraction was in the range of 60% to 65%. Wall  motion was normal; there were no regional wall motion  abnormalities. Doppler parameters are consistent with abnormal  left ventricular relaxation (grade 1 diastolic dysfunction).  There was no evidence of elevated ventricular filling pressure by  Doppler parameters.  - Aortic valve: Valve area (VTI): 2.27 cm^2. Valve area (Vmax): 2.1  cm^2. Valve area (Vmean): 2.24 cm^2.  - Mitral valve: Valve area by pressure half-time: 2.44 cm^2.  - Right ventricle: Systolic function was normal.  - Right atrium: The atrium was normal in size.  - Tricuspid valve: There was no regurgitation.  - Pulmonary arteries: Systolic pressure was within the normal  range.  - Inferior vena cava: The vessel was normal in size.  - Pericardium, extracardiac: There was no pericardial effusion.    ASSESSMENT AND PLAN:  1.  Precordial chest pain. Symptoms are atypical but she has multiple cardiac risk factors including HTN, HLD, prior tobacco use and strong family history of CAD placing her at intermediate risk. Recommend further ischemic evaluation. Discussed options including stress testing, CTA or cardiac cath. After discussion of different modalities including risk I would favor Coronary CTA and patient is in agreement. 2. Palpitations. Will arrange for an event monitor. 3. HLD. I anticipate we will need to consider statin therapy. Will await results of CTA. 4. HTN. Under control 5. Family history of premature CAD 6. Former smoker.    Current medicines are reviewed at length with the patient today.  The patient does not have concerns regarding medicines.  The following changes have been made:  no change  Labs/ tests ordered today include:   Orders Placed This Encounter  Procedures  . CT CORONARY MORPH W/CTA COR W/SCORE W/CA W/CM &/OR WO/CM  .  CT CORONARY FRACTIONAL FLOW RESERVE DATA PREP  . CT CORONARY FRACTIONAL FLOW RESERVE FLUID ANALYSIS  . Basic metabolic panel  . Cardiac event monitor  . EKG 12-Lead     Disposition:   FU with post above studies.  Signed, Zoe Creasman [place], MD  06/13/2020 9:33 AM    [facility] [address] [place], [place], [address] Phone [phone], Fax [phone]

## 2020-06-13 ENCOUNTER — Encounter: Payer: Self-pay | Admitting: Cardiology

## 2020-06-13 ENCOUNTER — Other Ambulatory Visit: Payer: Self-pay

## 2020-06-13 ENCOUNTER — Ambulatory Visit: Payer: Medicare Other | Admitting: Cardiology

## 2020-06-13 VITALS — BP 122/62 | HR 60 | Temp 97.9°F | Ht 65.0 in | Wt 170.8 lb

## 2020-06-13 DIAGNOSIS — Z8249 Family history of ischemic heart disease and other diseases of the circulatory system: Secondary | ICD-10-CM

## 2020-06-13 DIAGNOSIS — E7800 Pure hypercholesterolemia, unspecified: Secondary | ICD-10-CM

## 2020-06-13 DIAGNOSIS — R002 Palpitations: Secondary | ICD-10-CM

## 2020-06-13 DIAGNOSIS — R072 Precordial pain: Secondary | ICD-10-CM

## 2020-06-13 DIAGNOSIS — I10 Essential (primary) hypertension: Secondary | ICD-10-CM | POA: Diagnosis not present

## 2020-06-13 MED ORDER — METOPROLOL TARTRATE 50 MG PO TABS: ORAL_TABLET | ORAL | 0 refills | Status: DC

## 2020-06-13 NOTE — Patient Instructions (Addendum)
Medication Instructions:  Continue same medications *If you need a refill on your cardiac medications before your next appointment, please call your pharmacy*   Lab Work: Bmet to be done 1 week before Coronary CT   Testing/Procedures: 30 day Event Monitor  Coronary CT  Will be scheduled after approved by insurance   Follow instructions below   Follow-Up: At [facility], you and your health needs are our priority.  As part of our continuing mission to provide you with exceptional heart care, we have created designated Provider Care Teams.  These Care Teams include your primary Cardiologist (physician) and Advanced Practice Providers (APPs -  Physician Assistants and Nurse Practitioners) who all work together to provide you with the care you need, when you need it.  We recommend signing up for the patient portal called "MyChart".  Sign up information is provided on this After Visit Summary.  MyChart is used to connect with patients for Virtual Visits (Telemedicine).  Patients are able to view lab/test results, encounter notes, upcoming appointments, etc.  Non-urgent messages can be sent to your provider as well.   To learn more about what you can do with MyChart, go to [email or web address].    Your next appointment:  2 to 3 months   Wed 08/24/20 at 10:20 am   The format for your next appointment: Office    Provider:  Dr.Jordan     Your cardiac CT will be scheduled at one of the below locations:   [facility] [address] [place], [place] [address] [phone]  [place] [address] [place], [place] [address] [phone]  If scheduled at [facility], please arrive at the [facility] main entrance of [facility] 30 minutes prior to test start time. Proceed to the [facility] Radiology Department (first floor) to check-in and test prep.  If scheduled at [facility], please arrive 15 mins early for check-in and test prep.  Please follow these instructions carefully (unless otherwise directed):    On the Night Before the Test: . Be sure to Drink plenty of water. . Do not consume any caffeinated/decaffeinated beverages or chocolate 12 hours prior to your test. . Do not take any antihistamines 12 hours prior to your test.   On the Day of the Test: . Drink plenty of water. Do not drink any water within one hour of the test. . Do not eat any food 4 hours prior to the test. . You may take your regular medications prior to the test.  . Take metoprolol 50 mg two hours prior to test. . FEMALES- please wear underwire-free bra if available          After the Test: . Drink plenty of water. . After receiving IV contrast, you may experience a mild flushed feeling. This is normal. . On occasion, you may experience a mild rash up to 24 hours after the test. This is not dangerous. If this occurs, you can take Benadryl 25 mg and increase your fluid intake. . If you experience trouble breathing, this can be serious. If it is severe call 911 IMMEDIATELY. If it is mild, please call our office.    Once we have confirmed authorization from your insurance company, we will call you to set up a date and time for your test. Based on how quickly your insurance processes prior authorizations requests, please allow up to 4 weeks to be contacted for  scheduling your Cardiac CT appointment. Be advised that routine Cardiac CT appointments could be scheduled as many as 8 weeks after your provider has ordered it.  For non-scheduling related questions, please contact the cardiac imaging nurse navigator should you have any questions/concerns: Marchia Bond, Cardiac Imaging Nurse Navigator Burley Saver, Interim Cardiac Imaging Nurse [place] [facility] Direct Office Dial: [phone]   For scheduling needs, including cancellations and  rescheduling, please call [place], [phone] (temporary number).

## 2020-06-20 ENCOUNTER — Ambulatory Visit (INDEPENDENT_AMBULATORY_CARE_PROVIDER_SITE_OTHER): Payer: Medicare Other

## 2020-06-20 ENCOUNTER — Encounter: Payer: Self-pay | Admitting: Cardiology

## 2020-06-20 DIAGNOSIS — R072 Precordial pain: Secondary | ICD-10-CM

## 2020-06-20 DIAGNOSIS — Z8249 Family history of ischemic heart disease and other diseases of the circulatory system: Secondary | ICD-10-CM | POA: Diagnosis not present

## 2020-06-20 DIAGNOSIS — I10 Essential (primary) hypertension: Secondary | ICD-10-CM

## 2020-06-20 DIAGNOSIS — R002 Palpitations: Secondary | ICD-10-CM | POA: Diagnosis not present

## 2020-06-20 DIAGNOSIS — E7800 Pure hypercholesterolemia, unspecified: Secondary | ICD-10-CM

## 2020-06-21 ENCOUNTER — Telehealth: Payer: Self-pay

## 2020-06-21 NOTE — Telephone Encounter (Signed)
Submitted VOB, SynviscOne, bilateral knee. 

## 2020-06-22 ENCOUNTER — Telehealth: Payer: Self-pay | Admitting: Orthopaedic Surgery

## 2020-06-22 MED ORDER — TRAMADOL HCL 50 MG PO TABS
50.0000 mg | ORAL_TABLET | Freq: Four times a day (QID) | ORAL | 0 refills | Status: DC | PRN
Start: 2020-06-22 — End: 2020-07-15

## 2020-06-22 NOTE — Telephone Encounter (Signed)
Pt called stating her knee pain is getting worse and she has an appt for gel injections on 07/04/20 but she would like a CB to be advised what she should do until then  [phone]

## 2020-06-22 NOTE — Telephone Encounter (Signed)
Cant have the gel injection until 07/01/20. Please advise

## 2020-06-22 NOTE — Telephone Encounter (Signed)
I will send in some tramadol to see if that will help with her pain.  This really all we can do.

## 2020-06-22 NOTE — Telephone Encounter (Signed)
Called and advised, pt was ok with this plan

## 2020-06-28 ENCOUNTER — Telehealth: Payer: Self-pay | Admitting: Cardiology

## 2020-06-28 NOTE — Telephone Encounter (Signed)
Spoke with pt directly to inform her that there was no contraindication for her cardiac CT related to a knee injection a few days prior. The patient verbalized some questions about the use of her metoprolol prior to her CT. All questions answer and pt verbalized understanding. Additionally, number for CT RN Navigator given to pt for any additional questions related to CT preparation or procedure.

## 2020-06-28 NOTE — Telephone Encounter (Signed)
Patient states she is scheduled for a gel inejction of the knee on 07/04/20 and she would like to ensure that this will not interfere with CT cardiac scheduled for 07/08/20. Please advise.

## 2020-06-29 ENCOUNTER — Telehealth: Payer: Self-pay

## 2020-06-29 NOTE — Telephone Encounter (Signed)
Approved for SynviscOne, bilateral knee. Buy & Bill Patient will be responsible for 20% OOP. Co-pay of $30.00 required No PA required for J7325, per Ran M.with [employer]. Reference# [id number]  Appt. 07/04/2020 with Dr. Magnus Ivan

## 2020-07-01 LAB — BASIC METABOLIC PANEL
BUN/Creatinine Ratio: 19 (ref 12–28)
BUN: 19 mg/dL (ref 8–27)
CO2: 26 mmol/L (ref 20–29)
Calcium: 10 mg/dL (ref 8.7–10.3)
Chloride: 104 mmol/L (ref 96–106)
Creatinine, Ser: 0.98 mg/dL (ref 0.57–1.00)
GFR calc Af Amer: 66 mL/min/{1.73_m2} (ref >59)
GFR calc non Af Amer: 57 mL/min/{1.73_m2} — ABNORMAL LOW (ref >59)
Glucose: 102 mg/dL — ABNORMAL HIGH (ref 65–99)
Potassium: 4.5 mmol/L (ref 3.5–5.2)
Sodium: 142 mmol/L (ref 134–144)

## 2020-07-04 ENCOUNTER — Ambulatory Visit: Payer: Medicare Other | Admitting: Orthopaedic Surgery

## 2020-07-04 ENCOUNTER — Encounter: Payer: Self-pay | Admitting: Orthopaedic Surgery

## 2020-07-04 DIAGNOSIS — M1711 Unilateral primary osteoarthritis, right knee: Secondary | ICD-10-CM | POA: Diagnosis not present

## 2020-07-04 DIAGNOSIS — M1712 Unilateral primary osteoarthritis, left knee: Secondary | ICD-10-CM

## 2020-07-04 MED ORDER — HYLAN G-F 20 48 MG/6ML IX SOSY
48.0000 mg | PREFILLED_SYRINGE | INTRA_ARTICULAR | Status: AC | PRN
  Administered 2020-07-04: 48 mg via INTRA_ARTICULAR

## 2020-07-04 NOTE — Progress Notes (Signed)
   Procedure Note  Patient: Debbie Cardenas             Date of Birth: 1945-10-15           MRN: [id number]             Visit Date: 07/04/2020  Procedures: Visit Diagnoses:  1. Unilateral primary osteoarthritis, left knee   2. Unilateral primary osteoarthritis, right knee     Large Joint Inj: R knee on 07/04/2020 11:04 AM Indications: pain and diagnostic evaluation Details: 22 G 1.5 in needle, superolateral approach  Arthrogram: No  Medications: 48 mg Hylan 48 MG/6ML Outcome: tolerated well, no immediate complications Procedure, treatment alternatives, risks and benefits explained, specific risks discussed. Consent was given by the patient. Immediately prior to procedure a time out was called to verify the correct patient, procedure, equipment, support staff and site/side marked as required. Patient was prepped and draped in the usual sterile fashion.   Large Joint Inj: L knee on 07/04/2020 11:04 AM Indications: pain and diagnostic evaluation Details: 22 G 1.5 in needle, superolateral approach  Arthrogram: No  Medications: 48 mg Hylan 48 MG/6ML Outcome: tolerated well, no immediate complications Procedure, treatment alternatives, risks and benefits explained, specific risks discussed. Consent was given by the patient. Immediately prior to procedure a time out was called to verify the correct patient, procedure, equipment, support staff and site/side marked as required. Patient was prepped and draped in the usual sterile fashion.    The patient is here today for bilateral hyaluronic acid injections with Synvisc 1 in her knees to treat the pain from osteoarthritis.  She has had these types of injections before and is fully aware of the risk and benefits of injections.  She has had no other acute change in her medical status.  Her right knee hurts much worse than her left knee.  She has had some tramadol on occasion for pain.  She is currently wearing a Holter monitor for an irregular  heartbeat that is being evaluated.  The right knee does have just a mild effusion and I would drain about 20 cc of fluid off of it.  The left knee did not have an effusion.  I did place Synvisc 1 in both knees to treat the pain from osteoarthritis.  As always, follow-up is as needed unless things worsen.  She knows to wait at least 3 months from now for 1 we can safely place a steroid injection in her knees in 6 months between hyaluronic acid injections.

## 2020-07-08 ENCOUNTER — Telehealth: Payer: Self-pay | Admitting: Cardiology

## 2020-07-08 ENCOUNTER — Other Ambulatory Visit: Payer: Self-pay

## 2020-07-08 ENCOUNTER — Ambulatory Visit (HOSPITAL_COMMUNITY)
Admission: RE | Admit: 2020-07-08 | Discharge: 2020-07-08 | Disposition: A | Discharge status: Home / Self Care | Payer: Medicare Other | Source: Ambulatory Visit | Attending: Cardiology | Admitting: Cardiology

## 2020-07-08 DIAGNOSIS — I700 Atherosclerosis of aorta: Secondary | ICD-10-CM | POA: Diagnosis not present

## 2020-07-08 DIAGNOSIS — I517 Cardiomegaly: Secondary | ICD-10-CM | POA: Insufficient documentation

## 2020-07-08 DIAGNOSIS — I2510 Atherosclerotic heart disease of native coronary artery without angina pectoris: Secondary | ICD-10-CM | POA: Diagnosis not present

## 2020-07-08 DIAGNOSIS — R072 Precordial pain: Secondary | ICD-10-CM

## 2020-07-08 MED ORDER — NITROGLYCERIN 0.4 MG SL SUBL
0.8000 mg | SUBLINGUAL_TABLET | Freq: Once | SUBLINGUAL | Status: AC
  Administered 2020-07-08: 0.8 mg via SUBLINGUAL

## 2020-07-08 MED ORDER — IOHEXOL 350 MG/ML SOLN
80.0000 mL | Freq: Once | INTRAVENOUS | Status: AC | PRN
  Administered 2020-07-08: 80 mL via INTRAVENOUS

## 2020-07-08 MED ORDER — NITROGLYCERIN 0.4 MG SL SUBL
SUBLINGUAL_TABLET | SUBLINGUAL | Status: AC
  Filled 2020-07-08: qty 2

## 2020-07-08 NOTE — Telephone Encounter (Signed)
Received a call from [employer] calling to report patient pressed monitor for syncope this morning.Monitor revealed sinus bradycardia rate 50 to 52.Stated they were unable to reach patient by phone. Called patient she stated she has not had any syncopal episodes.Stated she was having a coronary ct this morning.Stated  tech accidentally pressed the monitor.

## 2020-07-08 NOTE — Telephone Encounter (Signed)
Okey Regal with [employer] is calling to report a critical EKG.

## 2020-07-09 ENCOUNTER — Ambulatory Visit (HOSPITAL_COMMUNITY)
Admission: RE | Admit: 2020-07-09 | Discharge: 2020-07-09 | Disposition: A | Discharge status: Home / Self Care | Payer: Medicare Other | Source: Ambulatory Visit | Attending: Cardiology | Admitting: Cardiology

## 2020-07-09 DIAGNOSIS — R072 Precordial pain: Secondary | ICD-10-CM | POA: Diagnosis not present

## 2020-07-10 DIAGNOSIS — I2510 Atherosclerotic heart disease of native coronary artery without angina pectoris: Secondary | ICD-10-CM | POA: Diagnosis not present

## 2020-07-10 DIAGNOSIS — R931 Abnormal findings on diagnostic imaging of heart and coronary circulation: Secondary | ICD-10-CM | POA: Diagnosis not present

## 2020-07-11 ENCOUNTER — Other Ambulatory Visit: Payer: Self-pay

## 2020-07-11 ENCOUNTER — Telehealth: Payer: Self-pay

## 2020-07-11 DIAGNOSIS — Z01812 Encounter for preprocedural laboratory examination: Secondary | ICD-10-CM

## 2020-07-11 DIAGNOSIS — R072 Precordial pain: Secondary | ICD-10-CM

## 2020-07-11 MED ORDER — ROSUVASTATIN CALCIUM 20 MG PO TABS: 20.0000 mg | ORAL_TABLET | Freq: Every day | ORAL | 3 refills | Status: DC

## 2020-07-11 NOTE — Telephone Encounter (Signed)
° ° °  [facility] CARDIOVASCULAR DIVISION [facility] [address] [place] [address] [place] [address] Dept: [phone] Loc: [phone]  Debbie Cardenas  07/11/2020  You are scheduled for a Cardiac Cath on Wednesday 07/27/20,  with Dr.Jordan.  1. Please arrive at the [facility] (Main Entrance A) at [facility]: [address] [place], [place] [address] at 6:30 am (This time is two hours before your procedure to ensure your preparation). Free valet parking service is available.   Special note: Every effort is made to have your procedure done on time. Please understand that emergencies sometimes delay scheduled procedures.  2. Diet: Do not eat solid foods after midnight.  The patient may have clear liquids until 5am upon the day of the procedure.  3. Labs: You will need to have blood drawn on Thursday 07/14/20. You do not need to be fasting.      Covid Test  Wed 07/27/20 at 11:45 am at [address] Thru Site  You will need to quarantine until after cath.   4. Medication instructions in preparation for your procedure:        On the morning of your procedure, take your Aspirin 81 mg and any morning medicines NOT listed above.  You may use sips of water.  5. Plan for one night stay--bring personal belongings. 6. Bring a current list of your medications and current insurance cards. 7. You MUST have a responsible person to drive you home. 8. Someone MUST be with you the first 24 hours after you arrive home or your discharge will be delayed. 9. Please wear clothes that are easy to get on and off and wear slip-on shoes.  Thank you for allowing [place] to care for you!   -- [place] [facility]

## 2020-07-14 LAB — BASIC METABOLIC PANEL
BUN/Creatinine Ratio: 17 (ref 12–28)
BUN: 18 mg/dL (ref 8–27)
CO2: 26 mmol/L (ref 20–29)
Calcium: 10.1 mg/dL (ref 8.7–10.3)
Chloride: 102 mmol/L (ref 96–106)
Creatinine, Ser: 1.09 mg/dL — ABNORMAL HIGH (ref 0.57–1.00)
GFR calc Af Amer: 58 mL/min/{1.73_m2} — ABNORMAL LOW (ref >59)
GFR calc non Af Amer: 50 mL/min/{1.73_m2} — ABNORMAL LOW (ref >59)
Glucose: 96 mg/dL (ref 65–99)
Potassium: 4.6 mmol/L (ref 3.5–5.2)
Sodium: 141 mmol/L (ref 134–144)

## 2020-07-14 LAB — CBC WITH DIFFERENTIAL/PLATELET
Basophils Absolute: 0.1 10*3/uL (ref 0.0–0.2)
Basos: 1 %
EOS (ABSOLUTE): 0.1 10*3/uL (ref 0.0–0.4)
Eos: 1 %
Hematocrit: 38.6 % (ref 34.0–46.6)
Hemoglobin: 13.4 g/dL (ref 11.1–15.9)
Immature Grans (Abs): 0 10*3/uL (ref 0.0–0.1)
Immature Granulocytes: 0 %
Lymphocytes Absolute: 1.7 10*3/uL (ref 0.7–3.1)
Lymphs: 25 %
MCH: 31.1 pg (ref 26.6–33.0)
MCHC: 34.7 g/dL (ref 31.5–35.7)
MCV: 90 fL (ref 79–97)
Monocytes Absolute: 0.6 10*3/uL (ref 0.1–0.9)
Monocytes: 8 %
Neutrophils Absolute: 4.4 10*3/uL (ref 1.4–7.0)
Neutrophils: 65 %
Platelets: 285 10*3/uL (ref 150–450)
RBC: 4.31 x10E6/uL (ref 3.77–5.28)
RDW: 12.6 % (ref 11.7–15.4)
WBC: 6.9 10*3/uL (ref 3.4–10.8)

## 2020-07-14 LAB — PT AND PTT
INR: 1 (ref 0.9–1.2)
Prothrombin Time: 10.3 s (ref 9.1–12.0)
aPTT: 26 s (ref 24–33)

## 2020-07-14 NOTE — Telephone Encounter (Signed)
Covid test Monday 07/25/20 at 11:45 am.

## 2020-07-15 ENCOUNTER — Other Ambulatory Visit: Payer: Self-pay | Admitting: Orthopaedic Surgery

## 2020-07-15 NOTE — Telephone Encounter (Signed)
Please advise 

## 2020-07-25 ENCOUNTER — Other Ambulatory Visit: Payer: Self-pay | Admitting: Cardiology

## 2020-07-25 ENCOUNTER — Other Ambulatory Visit (HOSPITAL_COMMUNITY)
Admission: RE | Admit: 2020-07-25 | Discharge: 2020-07-25 | Disposition: A | Discharge status: Home / Self Care | Payer: Medicare Other | Source: Ambulatory Visit | Attending: Cardiology | Admitting: Cardiology

## 2020-07-25 DIAGNOSIS — Z20822 Contact with and (suspected) exposure to covid-19: Secondary | ICD-10-CM | POA: Diagnosis not present

## 2020-07-25 DIAGNOSIS — I209 Angina pectoris, unspecified: Secondary | ICD-10-CM

## 2020-07-25 DIAGNOSIS — Z01812 Encounter for preprocedural laboratory examination: Secondary | ICD-10-CM | POA: Insufficient documentation

## 2020-07-25 LAB — SARS CORONAVIRUS 2 (TAT 6-24 HRS): SARS Coronavirus 2: NEGATIVE

## 2020-07-25 MED ORDER — SODIUM CHLORIDE 0.9% FLUSH
3.0000 mL | Freq: Two times a day (BID) | INTRAVENOUS | Status: DC
Start: 2020-07-25 — End: 2023-02-14

## 2020-07-25 NOTE — Progress Notes (Signed)
Pt contacted pre-catheterization scheduled at [facility] for: Cardiac Catheterization  Verified arrival time and place: [facility] Main Entrance A [facility]) at: 6:30  No solid food after midnight prior to cath, CONTRAST ALLERGY: No  AM meds can be  taken pre-cath with sips of water including: ASA 81 mg   Confirmed patient has responsible adult to drive home post procedure and be with patient first 24 hours after arriving home:  You are allowed ONE visitor in the waiting room during the time you are at the hospital for your procedure. Both you and your visitor must wear a mask once you enter the hospital.       COVID-19 Pre-Screening Questions:  . In the past 14 days have you had any symptoms concerning for COVID-19 infection (fever, chills, cough, or new shortness of breath)? . In the past 14 days have you been around anyone with known Covid 19?

## 2020-07-27 ENCOUNTER — Encounter (HOSPITAL_COMMUNITY): Payer: Self-pay | Admitting: Cardiology

## 2020-07-27 ENCOUNTER — Other Ambulatory Visit: Payer: Self-pay

## 2020-07-27 ENCOUNTER — Ambulatory Visit (HOSPITAL_COMMUNITY)
Admission: RE | Admit: 2020-07-27 | Discharge: 2020-07-27 | Disposition: A | Discharge status: Home / Self Care | Payer: Medicare Other | Attending: Cardiology | Admitting: Cardiology

## 2020-07-27 ENCOUNTER — Encounter (HOSPITAL_COMMUNITY)
Admission: RE | Disposition: A | Discharge status: Home / Self Care | Payer: Self-pay | Source: Home / Self Care | Attending: Cardiology

## 2020-07-27 DIAGNOSIS — R002 Palpitations: Secondary | ICD-10-CM | POA: Diagnosis not present

## 2020-07-27 DIAGNOSIS — Z8249 Family history of ischemic heart disease and other diseases of the circulatory system: Secondary | ICD-10-CM | POA: Insufficient documentation

## 2020-07-27 DIAGNOSIS — E785 Hyperlipidemia, unspecified: Secondary | ICD-10-CM | POA: Diagnosis not present

## 2020-07-27 DIAGNOSIS — Z87891 Personal history of nicotine dependence: Secondary | ICD-10-CM | POA: Diagnosis not present

## 2020-07-27 DIAGNOSIS — Z79899 Other long term (current) drug therapy: Secondary | ICD-10-CM | POA: Diagnosis not present

## 2020-07-27 DIAGNOSIS — I10 Essential (primary) hypertension: Secondary | ICD-10-CM | POA: Diagnosis not present

## 2020-07-27 DIAGNOSIS — Z7982 Long term (current) use of aspirin: Secondary | ICD-10-CM | POA: Diagnosis not present

## 2020-07-27 DIAGNOSIS — I25119 Atherosclerotic heart disease of native coronary artery with unspecified angina pectoris: Secondary | ICD-10-CM | POA: Diagnosis not present

## 2020-07-27 DIAGNOSIS — I209 Angina pectoris, unspecified: Secondary | ICD-10-CM

## 2020-07-27 HISTORY — PX: LEFT HEART CATH AND CORONARY ANGIOGRAPHY: CATH118249

## 2020-07-27 SURGERY — LEFT HEART CATH AND CORONARY ANGIOGRAPHY
Anesthesia: LOCAL

## 2020-07-27 MED ORDER — ONDANSETRON HCL 4 MG/2ML IJ SOLN: 4.0000 mg | Freq: Four times a day (QID) | INTRAMUSCULAR | Status: DC | PRN

## 2020-07-27 MED ORDER — IOHEXOL 350 MG/ML SOLN
INTRAVENOUS | Status: AC
  Filled 2020-07-27: qty 1

## 2020-07-27 MED ORDER — SODIUM CHLORIDE 0.9% FLUSH: 3.0000 mL | INTRAVENOUS | Status: DC | PRN

## 2020-07-27 MED ORDER — SODIUM CHLORIDE 0.9 % WEIGHT BASED INFUSION: 1.0000 mL/kg/h | INTRAVENOUS | Status: DC

## 2020-07-27 MED ORDER — FENTANYL CITRATE (PF) 100 MCG/2ML IJ SOLN
INTRAMUSCULAR | Status: DC | PRN
  Administered 2020-07-27: 25 ug via INTRAVENOUS

## 2020-07-27 MED ORDER — HEPARIN SODIUM (PORCINE) 1000 UNIT/ML IJ SOLN
INTRAMUSCULAR | Status: AC
  Filled 2020-07-27: qty 1

## 2020-07-27 MED ORDER — HEPARIN (PORCINE) IN NACL 1000-0.9 UT/500ML-% IV SOLN
INTRAVENOUS | Status: DC | PRN
  Administered 2020-07-27 (×2): 500 mL

## 2020-07-27 MED ORDER — LABETALOL HCL 5 MG/ML IV SOLN
10.0000 mg | INTRAVENOUS | Status: DC | PRN
Start: 2020-07-27 — End: 2020-07-27

## 2020-07-27 MED ORDER — HEPARIN (PORCINE) IN NACL 1000-0.9 UT/500ML-% IV SOLN
INTRAVENOUS | Status: AC
  Filled 2020-07-27: qty 1000

## 2020-07-27 MED ORDER — LIDOCAINE HCL (PF) 1 % IJ SOLN
INTRAMUSCULAR | Status: DC | PRN
  Administered 2020-07-27: 2 mL via SUBCUTANEOUS

## 2020-07-27 MED ORDER — SODIUM CHLORIDE 0.9 % WEIGHT BASED INFUSION
3.0000 mL/kg/h | INTRAVENOUS | Status: AC
  Administered 2020-07-27: 3 mL/kg/h via INTRAVENOUS

## 2020-07-27 MED ORDER — ACETAMINOPHEN 325 MG PO TABS: 650.0000 mg | ORAL_TABLET | ORAL | Status: DC | PRN

## 2020-07-27 MED ORDER — HEPARIN SODIUM (PORCINE) 1000 UNIT/ML IJ SOLN
INTRAMUSCULAR | Status: DC | PRN
  Administered 2020-07-27: 4000 [IU] via INTRAVENOUS

## 2020-07-27 MED ORDER — SODIUM CHLORIDE 0.9 % IV SOLN: 250.0000 mL | INTRAVENOUS | Status: DC | PRN

## 2020-07-27 MED ORDER — VERAPAMIL HCL 2.5 MG/ML IV SOLN
INTRAVENOUS | Status: AC
  Filled 2020-07-27: qty 2

## 2020-07-27 MED ORDER — FENTANYL CITRATE (PF) 100 MCG/2ML IJ SOLN
INTRAMUSCULAR | Status: AC
  Filled 2020-07-27: qty 2

## 2020-07-27 MED ORDER — VERAPAMIL HCL 2.5 MG/ML IV SOLN
INTRAVENOUS | Status: DC | PRN
  Administered 2020-07-27: 10 mL via INTRA_ARTERIAL

## 2020-07-27 MED ORDER — ASPIRIN 81 MG PO CHEW: 81.0000 mg | CHEWABLE_TABLET | ORAL | Status: DC

## 2020-07-27 MED ORDER — METOPROLOL SUCCINATE ER 25 MG PO TB24: 25.0000 mg | ORAL_TABLET | Freq: Every day | ORAL | 11 refills | Status: DC

## 2020-07-27 MED ORDER — SODIUM CHLORIDE 0.9% FLUSH: 3.0000 mL | Freq: Two times a day (BID) | INTRAVENOUS | Status: DC

## 2020-07-27 MED ORDER — IOHEXOL 350 MG/ML SOLN
INTRAVENOUS | Status: DC | PRN
  Administered 2020-07-27: 65 mL via INTRA_ARTERIAL

## 2020-07-27 MED ORDER — MIDAZOLAM HCL 2 MG/2ML IJ SOLN
INTRAMUSCULAR | Status: DC | PRN
  Administered 2020-07-27: 2 mg via INTRAVENOUS

## 2020-07-27 MED ORDER — MIDAZOLAM HCL 2 MG/2ML IJ SOLN
INTRAMUSCULAR | Status: AC
  Filled 2020-07-27: qty 2

## 2020-07-27 MED ORDER — HYDRALAZINE HCL 20 MG/ML IJ SOLN
10.0000 mg | INTRAMUSCULAR | Status: DC | PRN
Start: 2020-07-27 — End: 2020-07-27

## 2020-07-27 MED ORDER — LIDOCAINE HCL (PF) 1 % IJ SOLN
INTRAMUSCULAR | Status: AC
  Filled 2020-07-27: qty 30

## 2020-07-27 SURGICAL SUPPLY — 11 items
CATH 5FR JL3.5 JR4 ANG PIG MP (CATHETERS) ×2 IMPLANT
CATH INFINITI 5 FR 3DRC (CATHETERS) ×2 IMPLANT
DEVICE RAD COMP TR BAND LRG (VASCULAR PRODUCTS) ×2 IMPLANT
ELECT DEFIB PAD ADLT CADENCE (PAD) ×2 IMPLANT
GLIDESHEATH SLEND SS 6F .021 (SHEATH) ×2 IMPLANT
GUIDEWIRE INQWIRE 1.5J.035X260 (WIRE) ×1 IMPLANT
INQWIRE 1.5J .035X260CM (WIRE) ×2
KIT HEART LEFT (KITS) ×2 IMPLANT
PACK CARDIAC CATHETERIZATION (CUSTOM PROCEDURE TRAY) ×2 IMPLANT
TRANSDUCER W/STOPCOCK (MISCELLANEOUS) ×2 IMPLANT
TUBING CIL FLEX 10 FLL-RA (TUBING) ×2 IMPLANT

## 2020-07-27 NOTE — Research (Addendum)
REVEAL PLAQUE  SCREENFAIL  REVEAL PLAQUE Informed Consent   Subject Name: Debbie Cardenas    Subject: [id number]  Subject met inclusion and exclusion criteria.  The informed consent form, study requirements and expectations were reviewed with the subject and questions and concerns were addressed prior to the signing of the consent form.  The subject verbalized understanding of the trial requirements.  The subject agreed to participate in the [facility] trial and signed the informed consent at 0730 on 07/27/2020.  The informed consent was obtained prior to performance of any protocol-specific procedures for the subject.  A copy of the signed informed consent was given to the subject and a copy was placed in the subject's medical record.   Debbie Cardenas   Inclusion: _0  1. Age >18 _1  2. Clinically stable patient with known CAD. _2   3. CCTA showing stenosis in at least one major epicardial vessel of stentable/graftable diameter, in whom clinically indicated IVUS is planned within 45 days of the CCTA  and FFR CT available. _3  4. FFR CT successfully processed. _4  5. Willing to comply with protocol. _5  6. Agrees to be included in the study and able to provide written informed consent.  Exclusion: _6  1. CCTA showing no stenosis. _7  2. Uninterpretable CCTA by HeartFlow assessment, in which image quality prevents FFR CT from being processed. _8  3. Acute chest pain. _9  4. CABG prior to CCTA acquisition. _10  5. Prior history of PCI for 3 or more vessels. _11  6. MI less than 30 days prior to CCTA or between CCTA and ICA. _12  7. Suspicion of acute coronary syndrome, Acute MI or Unstable Angina. _13  8. Known complex congenital heart disease. _14  9. Tachycardia or significant arrythmia. _15  10. Subject requires an emergent procedure. _16  11. Evidence of ongoing or active clinical instability, including acute chest pain  (sudden onset), cardiogenic shock, unstable blood pressure with systolic blood  pressure <91 mmHg, and severe congestive heart failure (NYHA lll or lV) or acute  pulmonary edema.  _17  12. Any active, serious, life-threatening disease with a life expectancy of less than 2 months. _18  13. Currently enrolled in another study utilizing FFR CT or in an investigational trial that involves a non-approved cardiac drug or device.  _19  14. Persons under the protection of justice, guardianship, or curatorship.  Reason for CCTA scan: _20  Chest Pain _21  Asymptomatic/screening     _22  Prior MI     _23  Dyspnea   _24  Palpitations _25  Abnormal ECG    _26    Abnormal Stress or Perfusion   _27  Other Which CT Scanner was used?          _28  Philips Model: _29  CT 5000 Ingenuity    _30  Spectral CT 7500   _31  CT6000 iCT    _32  IQon         _33   Incisive CT    _34   Other _35  Siemens Model: _36  Somatom Definition Edge    _37  Somatom Definition Flash            _38  Somatom Force  _39  Somatom Drive   <YNWGNFAOZHYQMVHQ>_4<\/ONGEXBMWUXLKGMWN>_02   Other _41  Product/process development scientist:  _42  Revolution CT  _43  CardioGraph   _44  Optima CT660  _45   Discovery HD 750   _46  Other Radiation Exposure for CCTA Only (Sum for All Coronary Series)  CT dose index (CTDI) (mGy)  __________42.63____________  Dose-length product (DLP) (mGy-cm) ______524.4__________   _47   CCTA Image Uploaded   _48  CCTA report uploaded  _49  All subject information  redacted from image and report? Subject Demographics: Age: ___74____    Year of birth:  ___1947____ Birth Sex: _0  Female   _1   Female  Weight: _77.1___ _2 lb  _3  kg   _4  Not done Height: __65____ _5  in  _6  cm   _7  Not done  Ethnicity: _8  Not of Hispanic, Latino/a, or Spanish origin _9  Hispanic, Latino/a, or Spanish origin Specify: _10   [place] American, Chicano/a      _11  [place]  _12  [place] _13 other Hispanic, Latino/a, or Spanish origin Race:  _14  White  _15  Black  _16  American [place] or [place] Native  _17  Asian:  Specify: _18  Asian [place]   _19  [place]  _20  [place]  _21  [place]   _22  Filipino  _23  Other Asian  _24  [place]   _25   Native Hawaiian or other Pacific Islander              _26  Native Hawaiian   _27  Guamanian or Chamorro  _28  Samoan   _29  Other Pacific Islander   _30  Other (specify) Medical History: Angina within the last 45 days:   _31  Yes   _32  No If yes: _33  Typical _34  Atypical  _35  Dyspnea  _36  Noncardiac pain Angina type: _37  Stable _38  Unstable _39  Silent Ischemia  _40  Unknown _41  Other Congestive Heart Failure: _42  Yes _43  No Diabetes: _44  Yes  _45  No If yes:  _46  Type l   _47  Type ll Controlled by:  _48  Insulin   _49  Oral hypoglycemic  _50  Diet  _51   unknown Hypertension:  _52  Yes  <SFKCLEXNTZGYFVCB>_4<\/WHQPRFFMBWGYKZLD>_35   No  (Systolic >701, diastolic >77 or req. medication) Is subject taking anti-hypertensive medication?   _54  Yes  _55   No Hyperlipidemia:   _56  Yes  _57  No Is subject taking anti-hyperlipidemic medication?    _58  Yes  _59  No Tobacco Use:  _60  Former  _61  Current  _62   Non-smoker  _63   Unknown Any nicotine use in 24 hours prior to CCTA?   _64  Yes  _65   No Family history of premature atherosclerotic disease?   _66  Yes  _67   No (Coronary artery disease, cerebrovascular disease, or peripheral vascular disease for female 2o relatives < 71 and/or female 2o relatives < 66 years old) Stroke:  _68  Yes  _69  No Transient Ischemic Attack (TIA):   _70  Yes  _71  No Peripheral Vascular Disease:  _72  Yes  _73  No Sedentary Lifestyle:    _74  Yes   _75  No Other Relevant Disease or Comorbidity:    _76  Yes  _77  No Specify: ___________________   PCI History: Does the subject have previously stented vessels?   _78  Yes  _79  No ______________ Total number of PCI procedures ______________ Date of most recent PCI ______________ Total number of stented vessels NOTE: CASS Segment Diagram is provided for reference in Appendix A. PCI History Records: (complete a record for each stent) Stent 1:           Vessel: _80  Right coronary artery  _81  Left main artery  _82  Left circumflex artery _83  Left anterior descending artery   _84  Other________________ CASS Segment for starting location of stent:  ______________________________ CASS Segment for ending location of stent: _______________________________ Stent manufacturer: _________________________________________________ Diameter of stent (mm): _____________   Length of stent (mm): _____________ Stent 2:             Vessel: _85  Right coronary artery  _86  Left main artery  _87  Left circumflex artery _88  Left anterior descending artery   _89  Other________________ CASS Segment  for starting location of stent: ______________________________ CASS Segment for ending location of stent: _______________________________ Stent manufacturer: _________________________________________________ Diameter of stent (mm): _____________   Length of stent (mm): _____________ Coronary Tests: Any coronary tests within 90 days prior to enrollment?  _0  Yes  _1  No Invasive Coronary Angiography (ICA):    _2  Yes _3  No Most recent test date: _________________   Result: _4  Positive  _5  Negative  _6  Intermediate  _7  Unknown _8  Image uploaded       Report: _9  Uploaded  _10  N/A _11  All subject information redacted from images and reports? Exercise Tolerance Test (ETT):  _12 Yes  _13  No     Most recent test date: Click or tap to enter a date. Result:   _14  Positive    _15  Negative  _16  Intermediate  _17   Unknown _18  Image uploaded    Report:  _19  Uploaded  _20  N/A  _21   All subject information redacted from images and reports? Stress Echocardiogram: _22  Yes  _23  No   Most recent date:Click or tap to enter a date. Result:  _24  Positive  _25  Negative  _26  Intermediate  _27  Unknown _28 Image uploaded    Report:  _29  Uploaded  _30  N/A      _31  All images redacted? Nuclear Myocardial Perfusion Scan:    _32  Yes  _33  No Most recent test date: Click or tap to enter a date.  Result:   _34  Positive    _35  Negative  _36  Intermediate  _37   Unknown _38 Image uploaded    Report:  _39  Uploaded  _40  N/A      _41  All images redacted? Cardiac MRI:   _42  Yes  _43  No Most recent test date: Click or tap to enter a  date.  Result:   _44  Positive    _45  Negative  _46  Intermediate  _47   Unknown _48 Image uploaded    Report:  _49  Uploaded  _50  N/A      _51  All images redacted? Cardiac PET:    _52  Yes  _53  No Most recent test date: Click or tap to enter a date.  Result:   _54  Positive    _55  Negative  _56  Intermediate  _57   Unknown _58 Image uploaded    Report:  _59  Uploaded  _60  N/A      _61  All images redacted?  Baseline Cath Procedure: Date of procedure: Click or tap to enter a date. Blood pressure prior to procedure:   Heart Rate prior to procedure: Time of arterial access: Time of first lesion treatment: Time last guide catheter removed: _______________ Aortic pressure (mean mmHg): __________________ LVED pressure (mmHg): ________________________ Arterial access site:   _62  Radial  _63  Femoral  _64  Brachial Maximum arterial sheath size (Fr) _______________ Venous access needle size:  _65  18 G  _66  20 G  _67  4 FR  _68  5 FR  _69  6 FR  _70  Other Left ventriculography performed?  _71  Yes  _72  No If yes, when:   _73  Pre-intervention  _74  Post- intervention LVEF (%):_______________ Were there any procedural complications?          _75  Yes  _76  No _77  Dissection from wire  _78  Dissection from catheter  _79  Slow / no flow _80  Allergic reaction to medication  _81  Side branch occlusion  _82  Other  Was the FFRCT model available & used in cath lab during the procedure? _83  Y  _84  N _85  Angio image uploaded  _86  Cath report uploaded  _87  All subject information redacted from images  and reports?  Intraprocedural Medications: Was adenosine administered within 24 hrs of the cath procedure?  _0  Yes  _1  No IV Adenosine start time: _______________ Adenosine dose:  _2  140 ug/kg/min  _3  Other dose  _4  None Was nitroglycerin administered within 24 hrs of the cath procedure? _5  Yes _6  No NTG dose: _____________ NTG Route: _7  IV  _8  IC  _9  Other  Intraprocedural Devices: (total number of each device used during the procedure): _________ Pressure wires & FFR  wires _________ Guidewires   (Do not include wires used before intervention and pressure/FFR wires) _________ Balloon catheters _________ Drug eluting stents _________ Bare metal stents _________ Guide catheters _________ Intravascular ultrasound catheters _________ Intravascular ultrasound OCT catheters _________ Thrombectomy and atherectomy catheters _________ Temporary pacemakers _________ Intra-aortic balloon pumps (IABP) Intraprocedural Radiation exposure: Total fluoroscopy time _________________ (minutes) Absorbed Dose of Radiation _____________   _10  mGy  _11  Gy Dose Area Product: ____________________  _12  Gy*cm2  _13  cGy*cm2  _14  mGy*cm2  Other: ______________ Contrast Use: (check all that apply) _15  Ominipaque  _16  Visipaque  _17   Isovue  _18  Iomeron  _19  Ultravist   _20 Oxilan _21  Hypaque  _22  Isopaque  _23  Hexibrix  _24  OptiRay  _25  Iopamidol  Total amount of contrast used (mL): ______________  IVUS/OCT: Model: _26  Philips Volcano                    Catheter type:    _27  Revolution  _28  Eagle Eye Platinum  _29  Eagle Eye  P     Platinum ST          _30  Refinity ST               _31  [employer]                   Catheter type:  _32  Opticross   _33  Opticross HD  _34  Other                _35  Terumo                    Catheter type:   _36  ViewIT    _37  Intrafocus WR  Ivus Image uploaded:  _38  Yes  _39  No OCT taken?   _40  Yes   _41  No   _42  All subject information redacted from images and reports?  IVUS/OCT Records Was automatic pullback used for IVUS/OCT acquisition?   _43  Yes  _44  No       Pullback speed (mm/sec): _____________________        Length of pullback (mm): _____________________ Vessel 1: Image type:   _45  IVUS   _46  OCT  _47  Right coronary artery  _48  Left main artery  _49  Left circumflex artery _50  Left anterior descending artery   _51  Other________________ CASS Segment for starting location of guidewire: __________________________ CASS Segment for ending location of guidewire:  ________________________ Guide catheter size:  _52 4 FR  _53 5 FR  _54 6 FR  _55 7 FR  _56 8 FR Vessel 2: Image type:   _57  IVUS   _58  OCT  _59  Right coronary artery  _60  Left main artery  _61  Left circumflex artery _62  Left anterior descending artery   _63  Other________________ CASS Segment for starting location of guidewire: __________________________ CASS Segment for ending location of guidewire: ________________________ Guide catheter size:  _64 4 FR  _65 5 FR  _66 6 FR  _67 7 FR  _68 8 FR Vessel 3: Image type:   _69  IVUS   _70  OCT  _71  Right  coronary artery  _0  Left main artery  _1  Left circumflex artery _2  Left anterior descending artery   _3  Other________________ CASS Segment for starting location of guidewire: __________________________ CASS Segment for ending location of guidewire: ________________________ Guide catheter size:  _4 4 FR  _5 5 FR  _6 6 FR  _7 7 FR  _8 8 FR FFR/NHPR Records Measurement taken during cath procedure: _9  FFR  _10  DFR  _11  IFR  _12  RFR   _13 Other Vessel 1: _14  Right coronary artery  _15  Left main artery  _16  Left circumflex artery _17  Left anterior descending artery   _18  Other________________ Contrast used?     _19  Yes   _20  No CASS Segment for starting location of guide wire: __________________________ CASS Segment for ending location of guide wire: ___________________________ Guide catheter size:  _21 4 FR  _22 5 FR  _23 6 FR  _24 7 FR  _25 8 FR FFR Value: _____________ Pd value: ______________ Pa value: ______________ NHPR value: ____________ Vessel 2: _26  Right coronary artery  _27  Left main artery  _28  Left circumflex artery _29  Left anterior descending artery   _30  Other________________ Contrast used?     _31  Yes   _32  No CASS Segment for starting location of guide wire: __________________________ CASS Segment for ending location of guide wire: ___________________________ Guide catheter size:  _33 4 FR  _34 5 FR  _35 6 FR  _36 7 FR  _37 8 FR FFR Value: _____________ Pd value: ______________ Pa value:  ______________ NHPR value: ____________  Vessel 3: _38  Right coronary artery  _39  Left main artery  _40  Left circumflex artery _41  Left anterior descending artery   _42  Other________________ Contrast used?     _43  Yes   _44  No CASS Segment for starting location of guide wire: __________________________ CASS Segment for ending location of guide wire: ___________________________ Guide catheter size:  _45 4 FR  _46 5 FR  _47 6 FR  _48 7 FR  _49 8 FR FFR Value: _____________ Pd value: ______________ Pa value: ______________ NHPR value: ____________ FFR Uploads _50  Angio Image  _51  FFR Report  _52  FFR Tracings  _53  Screenshot of Angio - starting position of FFR guidewire without contrast _54  Screenshot of Angio - starting position of FFR guidewire with contrast  NHPR Uploads _55  Angio Image  _56  NHPR Report  _57  NHPR Tracings  _58  Screenshot of Angio - starting position of NHPR guidewire without contrast _59  Screenshot of Angio - starting position of NHPR guidewire with contrast _60  All subject information redacted from images and reports?  Study completion:  Did subject complete participation in the study?    _61  Yes  _62  No If no, reason:                         _63  Death _64  Withdrew consent  _65  Other  _66  Screen Failure Screen Failure reason:  _67  Ivus not used  _68  Use of balloon prior  _69  Automatic pullback not used

## 2020-07-27 NOTE — Discharge Instructions (Addendum)
Radial Site Care ° °This sheet gives you information about how to care for yourself after your procedure. Your health care provider may also give you more specific instructions. If you have problems or questions, contact your health care provider. °What can I expect after the procedure? °After the procedure, it is common to have: °· Bruising and tenderness at the catheter insertion area. °Follow these instructions at home: °Medicines °· Take over-the-counter and prescription medicines only as told by your health care provider. °Insertion site care °· Follow instructions from your health care provider about how to take care of your insertion site. Make sure you: °? Wash your hands with soap and water before you change your bandage (dressing). If soap and water are not available, use hand sanitizer. °? Change your dressing as told by your health care provider. °? Leave stitches (sutures), skin glue, or adhesive strips in place. These skin closures may need to stay in place for 2 weeks or longer. If adhesive strip edges start to loosen and curl up, you may trim the loose edges. Do not remove adhesive strips completely unless your health care provider tells you to do that. °· Check your insertion site every day for signs of infection. Check for: °? Redness, swelling, or pain. °? Fluid or blood. °? Pus or a bad smell. °? Warmth. °· Do not take baths, swim, or use a hot tub until your health care provider approves. °· You may shower 24-48 hours after the procedure, or as directed by your health care provider. °? Remove the dressing and gently wash the site with plain soap and water. °? Pat the area dry with a clean towel. °? Do not rub the site. That could cause bleeding. °· Do not apply powder or lotion to the site. °Activity ° °· For 24 hours after the procedure, or as directed by your health care provider: °? Do not flex or bend the affected arm. °? Do not push or pull heavy objects with the affected arm. °? Do not  drive yourself home from the hospital or clinic. You may drive 24 hours after the procedure unless your health care provider tells you not to. °? Do not operate machinery or power tools. °· Do not lift anything that is heavier than 10 lb (4.5 kg), or the limit that you are told, until your health care provider says that it is safe. °· Ask your health care provider when it is okay to: °? Return to work or school. °? Resume usual physical activities or sports. °? Resume sexual activity. °General instructions °· If the catheter site starts to bleed, raise your arm and put firm pressure on the site. If the bleeding does not stop, get help right away. This is a medical emergency. °· If you went home on the same day as your procedure, a responsible adult should be with you for the first 24 hours after you arrive home. °· Keep all follow-up visits as told by your health care provider. This is important. °Contact a health care provider if: °· You have a fever. °· You have redness, swelling, or yellow drainage around your insertion site. °Get help right away if: °· You have unusual pain at the radial site. °· The catheter insertion area swells very fast. °· The insertion area is bleeding, and the bleeding does not stop when you hold steady pressure on the area. °· Your arm or hand becomes pale, cool, tingly, or numb. °These symptoms may represent a serious problem   that is an emergency. Do not wait to see if the symptoms will go away. Get medical help right away. Call your local emergency services (911 in the [place] Do not drive yourself to the hospital. Summary  After the procedure, it is common to have bruising and tenderness at the site.  Follow instructions from your health care provider about how to take care of your radial site wound. Check the wound every day for signs of infection.  Do not lift anything that is heavier than 10 lb (4.5 kg), or the limit that you are told, until your health care provider says  that it is safe. This information is not intended to replace advice given to you by your health care provider. Make sure you discuss any questions you have with your health care provider. Document Revised: 08/21/2017 Document Reviewed: 08/21/2017 Elsevier Patient Education  2020 [employer]. Continue your current therapy  Add Toprol XL 25 mg daily

## 2020-07-27 NOTE — H&P (Signed)
Cardiology Admission History and Physical:   Patient ID: Debbie Cardenas MRN: [id number]; DOB: [birth year]   Admission date: 07/27/2020  Primary Care Provider: Porfirio Oar, PA [facility] Cardiologist: Neno Hohensee [place] MD  Chief Complaint:  Chest pain  Patient Profile:   Debbie Cardenas is a 74 y.o. female with history of HTN, HLD and family history of CAD is seen for evaluation of chest pain and palpitations  History of Present Illness:   Debbie Cardenas reports in August she was having symptoms of pain in the left breast area. This would often be associated with a pause in her heart rhythm. Not related to exertion and may occur at any time of day. She has noted symptoms of fluttering in her heart for some years. No dizziness or syncope. Does note when she has these symptoms she feels very fatigued. She does have a history of HTN and HLD. She is a former smoker- quit about 5 years ago. Has a very strong history of premature CAD. I take care of 2 of her sisters - Debbie Cardenas and Debbie Cardenas.   To further evaluate her risk we performed Coronary CTA. This demonstrated a calcium score of over 1000. There was suggestion of flow limiting stenosis in the RCA and LAD territories although analysis difficult due to heavy calcification. Felt to be a high risk scan.  We also had her wear an event monitor which was normal.    Past Medical History:  Diagnosis Date  . Closed fracture of neck of left femur (HCC) 08/15/2018  . Hyperlipidemia   . Hypertension     Past Surgical History:  Procedure Laterality Date  . CATARACT EXTRACTION, BILATERAL    . KNEE SURGERY    . TOTAL HIP ARTHROPLASTY Left 08/15/2018   Procedure: TOTAL HIP ARTHROPLASTY ANTERIOR APPROACH;  Surgeon: Kathryne Hitch, MD;  Location: [facility];  Service: Orthopedics;  Laterality: Left;     Medications Prior to Admission: Prior to Admission medications   Medication Sig Start Date End Date Taking? Authorizing Provider   acetaminophen (TYLENOL) 650 MG CR tablet Take 1,300 mg by mouth 2 (two) times daily as needed for pain.   Yes [provider]  amLODipine (NORVASC) 10 MG tablet Take 10 mg by mouth daily. 03/30/20  Yes [provider]  aspirin EC 81 MG tablet Take 1 tablet (81 mg total) by mouth daily. Swallow whole. 07/11/20  Yes [place], Machele Deihl M, MD  Cholecalciferol (VITAMIN D) 50 MCG (2000 UT) tablet Take 2,000 Units by mouth daily.   Yes [provider]  Homeopathic Products [facility] RELIEF EX) Apply 1 application topically daily as needed (pain).   Yes [provider]  magnesium oxide (MAG-OX) 400 MG tablet Take 400 mg by mouth daily.   Yes [provider]  OVER THE COUNTER MEDICATION Apply 1 application topically daily as needed (knee pain). veterinary liniment gel   Yes [provider]  Tetrahydrozoline HCl (VISINE OP) Place 1 drop into both eyes daily as needed (redness).   Yes [provider]  traMADol (ULTRAM) 50 MG tablet TAKE 1-2 TABLETS BY MOUTH EVERY 6 HOURS AS NEEDED. Patient taking differently: Take 50 mg by mouth every 6 (six) hours as needed for moderate pain. 07/15/20  Yes Kathryne Hitch, MD  rosuvastatin (CRESTOR) 20 MG tablet Take 1 tablet (20 mg total) by mouth daily. 07/11/20 10/09/20  [place], Dreden Rivere M, MD     Allergies:    Allergies  Allergen Reactions  . Ace  Inhibitors Cough    Social History:   Social History   Socioeconomic History  . Marital status: Married    Spouse name: Not on file  . Number of children: 3  . Years of education: Not on file  . Highest education level: Not on file  Occupational History  . Not on file  Tobacco Use  . Smoking status: Former Smoker    Quit date: 06/13/2014    Years since quitting: 6.1  . Smokeless tobacco: Never Used  Substance and Sexual Activity  . Alcohol use: Not Currently  . Drug use: Never  . Sexual activity: Not on file  Other Topics Concern  . Not on file   Social History Narrative  . Not on file   Social Determinants of Health   Financial Resource Strain: Not on file  Food Insecurity: Not on file  Transportation Needs: Not on file  Physical Activity: Not on file  Stress: Not on file  Social Connections: Not on file  Intimate Partner Violence: Not on file    Family History:   The patient's family history includes Cancer in her sister; Heart attack in her brother, brother, and father; Heart disease in her brother, brother, sister, and sister.    ROS:  Please see the history of present illness.  All other ROS reviewed and negative.     Physical Exam/Data:   Vitals:   07/27/20 0653  BP: (!) 131/56  Pulse: 62  Temp: 98.8 F (37.1 C)  TempSrc: Oral  SpO2: 99%  Weight: 77.1 kg  Height: 5\' 5"  (1.651 m)   No intake or output data in the 24 hours ending 07/27/20 0727 Last 3 Weights 07/27/2020 06/13/2020 08/17/2018  Weight (lbs) 170 lb 170 lb 12.8 oz 185 lb 6.5 oz  Weight (kg) 77.111 kg 77.474 kg 84.1 kg     Body mass index is 28.29 kg/m.  General:  Well nourished, well developed, in no acute distress HEENT: normal Lymph: no adenopathy Neck: no JVD Endocrine:  No thryomegaly Vascular: No carotid bruits; FA pulses 2+ bilaterally without bruits  Cardiac:  normal S1, S2; RRR; no murmur  Lungs:  clear to auscultation bilaterally, no wheezing, rhonchi or rales  Abd: soft, nontender, no hepatomegaly  Ext: no edema Musculoskeletal:  No deformities, BUE and BLE strength normal and equal Skin: warm and dry  Neuro:  CNs 2-12 intact, no focal abnormalities noted Psych:  Normal affect    EKG:  The ECG that was done 06/13/20 was personally reviewed and demonstrates NSR with mild nonspecific ST abnormality.   Relevant CV Studies: Event monitor: Study Highlights   Normal sinus rhythm  No significant ectopy seen  CLINICAL DATA:  74 year old with chest pain  EXAM: Cardiac/Coronary  CTA  TECHNIQUE: The patient was scanned  on a 65.  FINDINGS: A 110 kV prospective scan was triggered in the descending thoracic aorta at 111 HU's. Axial non-contrast 3 mm slices were carried out through the heart. The data set was analyzed on a dedicated work station and scored using the Agatson method. Gantry rotation speed was 250 msecs and collimation was .6 mm. Beta blockade and 0.8 mg of sl NTG was given. The 3D data set was reconstructed in 5% intervals of the 67-82 % of the R-R cycle. Diastolic phases were analyzed on a dedicated work station using MPR, MIP and VRT modes. The patient received 80 cc of contrast.  Aorta: Normal size. Ascending and descending atherosclerosis. No dissection.  Aortic Valve:  Trileaflet.  No calcifications.  Coronary Arteries:  Normal coronary origin.  Right dominance.  RCA is a small caliber (2.4 mm) dominant artery that gives rise to PDA and PLA. There is ostial calcified plaque. Given density of calcification, it is challenging to comment on ostial stenosis.  Left main is a large artery that gives rise to LAD and LCX arteries. There is ostial mixed calcified and non calcified plaque. 0-24% stenosis.  LAD is a large vessel that has no diffuse calcified plaque proximal to mid.  There are regions of possible flow limiting disease >50% stenosis. Sending for FFR analysis.  LCX is a non-dominant artery that gives rise to one large OM1 branch. There is diffuse calcified proximal plaque 25-49% stenosis. Mid AV groove calcified plaque 0-24%.  Other findings:  Normal pulmonary vein drainage into the left atrium.  Normal left atrial appendage without a thrombus.  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  Please see radiology report for non cardiac findings.  IMPRESSION: 1. Coronary calcium score of 1068. This was 95 percentile for age and sex matched control.  2. Normal coronary origin with right dominance.  3. Multivessel coronary artery  calcified plaque. Possible flow limitation ostial RCA (small caliber vessel) as well as LAD. Sending for FFR analysis.  4.  Aortic atherosclerosis  5.  Mildly dilated pulmonary artery.  Donato Schultz, MD [facility]   Electronically Signed   By: Donato Schultz MD   On: 07/08/2020 15:09   EXAM: FFRCT ANALYSIS 74 year old female with abnormal coronary CT.  FINDINGS: FFRct analysis was performed on the original cardiac CT angiogram dataset. Diagrammatic representation of the FFRct analysis is provided in a separate PDF document in PACS. This dictation was created using the PDF document and an interactive 3D model of the results. 3D model is not available in the EMR/PACS. Normal FFR range is >0.80.  1. LM: 0.93 - normal  2. LAD: Proximal  - 0.91, mid 0.79, distal 0.66 - ABNORMAL  3. LCX: Proximal 0.91, mid 0.90, distal 0.84 - normal  4.    RCA: Proximal - 0.76, mid 0.74, distal 0.71 - ABNORMAL  IMPRESSION: 1. Abnormal FFR of mid to distal LAD and entire RCA (ostial RCA calcified plaque).  2.  Recommend cardiac catheterization.  Donato Schultz, MD [facility]  Note: These examples are not recommendations of [facility] and only provided as examples of what other customers are doing.  Laboratory Data:  High Sensitivity Troponin:  No results for input(s): TROPONINIHS in the last 720 hours.    ChemistryNo results for input(s): NA, K, CL, CO2, GLUCOSE, BUN, CREATININE, CALCIUM, GFRNONAA, GFRAA, ANIONGAP in the last 168 hours.  No results for input(s): PROT, ALBUMIN, AST, ALT, ALKPHOS, BILITOT in the last 168 hours. HematologyNo results for input(s): WBC, RBC, HGB, HCT, MCV, MCH, MCHC, RDW, PLT in the last 168 hours. BNPNo results for input(s): BNP, PROBNP in the last 168 hours.  DDimer No results for input(s): DDIMER in the last 168 hours.   Radiology/Studies:  No results found.   Assessment and Plan:   1. Precordial chest pain. Coronary CTA is high risk with markedly  elevated calcium score and suggestion of multivessel obstructive disease. Symptoms are atypical but she has multiple cardiac risk factors including HTN, HLD, prior tobacco use and strong family history of CAD placing her at intermediate risk. Recommend further evaluation with cardiac cath. The procedure and risks were reviewed including but not limited to death, myocardial infarction, stroke, arrythmias, bleeding,  transfusion, emergency surgery, dye allergy, or renal dysfunction. The patient voices understanding and is agreeable to proceed. We also discussed the potential for PCI if this is the appropriate therapy and she is a suitable candidate.  2. Palpitations.  event monitor was normal. 3. HLD. Now on Crestor 20 mg daily 4. HTN. Under control 5. Family history of premature CAD 6. Former smoker.      :[id number]}      Signed, Cresencio Reesor [place], MD  07/27/2020 7:27 AM

## 2020-07-27 NOTE — Progress Notes (Signed)
[facility] consulted for outpatient CABG evaluation. [facility] office will call patient with an appointment. 

## 2020-07-27 NOTE — Interval H&P Note (Signed)
History and Physical Interval Note:  07/27/2020 7:37 AM  Gertie Exon  has presented today for surgery, with the diagnosis of abnomal ct.  The various methods of treatment have been discussed with the patient and family. After consideration of risks, benefits and other options for treatment, the patient has consented to  Procedure(s): LEFT HEART CATH AND CORONARY ANGIOGRAPHY (N/A) as a surgical intervention.  The patient's history has been reviewed, patient examined, no change in status, stable for surgery.  I have reviewed the patient's chart and labs.  Questions were answered to the patient's satisfaction.   Cath Lab Visit (complete for each Cath Lab visit)  Clinical Evaluation Leading to the Procedure:   ACS: No.  Non-ACS:    Anginal Classification: CCS II  Anti-ischemic medical therapy: Minimal Therapy (1 class of medications)  Non-Invasive Test Results: High-risk stress test findings: cardiac mortality >3%/year  Prior CABG: No previous CABG        Theron Arista [facility] 07/27/2020 7:37 AM

## 2020-08-02 ENCOUNTER — Encounter: Payer: Medicare Other | Admitting: Cardiothoracic Surgery

## 2020-08-04 ENCOUNTER — Encounter: Payer: Self-pay | Admitting: Surgery

## 2020-08-04 ENCOUNTER — Other Ambulatory Visit: Payer: Self-pay | Admitting: *Deleted

## 2020-08-04 ENCOUNTER — Other Ambulatory Visit: Payer: Self-pay

## 2020-08-04 ENCOUNTER — Institutional Professional Consult (permissible substitution): Payer: Medicare Other | Admitting: Surgery

## 2020-08-04 VITALS — BP 110/64 | HR 58 | Temp 97.9°F | Resp 20 | Ht 65.0 in | Wt 162.2 lb

## 2020-08-04 DIAGNOSIS — I2510 Atherosclerotic heart disease of native coronary artery without angina pectoris: Secondary | ICD-10-CM

## 2020-08-04 DIAGNOSIS — I25119 Atherosclerotic heart disease of native coronary artery with unspecified angina pectoris: Secondary | ICD-10-CM

## 2020-08-04 NOTE — Progress Notes (Signed)
Cardiothoracic Surgery Consultation  PCP is Porfirio Oar, PA Referring Provider is Porfirio Oar, [place] Primary cardiologist: Peter Swaziland, MD Chief Complaint  Patient presents with  . Coronary Artery Disease    New patient consultation, cath 07/27/20    HPI:  The patient is a 75 year old woman with a history of hypertension, hyperlipidemia, a strong family history of coronary disease, former smoker and degenerative arthritis of both knees who reports having some twinges of pain in her left breast area in August 2021.  This was not particularly related to exertion but was associated with some pauses in her heart rhythm.  She notes that after these episodes she felt very fatigued.  She underwent a coronary CTA to stratify her risk and calcium score was over 1000.  There was suggestion of flow-limiting stenosis in the RCA and LAD territories although analysis was difficult due to heavy calcification of the coronary arteries.  It was felt to be a high risk scan.  She had an event monitor placed which was normal.  Cardiac catheterization on 07/27/2020 showed 70% mid to distal left main stenosis.  There is 50% ostial to proximal LAD stenosis and then 70% proximal to mid LAD stenosis.  There is a moderate-sized diagonal branch that came off in this area of stenosis.  The first marginal had at least 50% stenosis.  The ostium of the RCA had 90% stenosis.  Left ventricular systolic function was normal with ejection fraction estimated 55 to 60%.  LVEDP was normal.   She is here today with her husband who has had bypass surgery in the past by Dr. Tyrone Sage.  She said she has had no further chest discomfort and denies any shortness of breath or fatigue but has not been very active due to her degenerative knee arthritis.  She recently injured her right knee when the dog ran into her and has had a lot of pain and not been very active.  She is taking Ultram as needed for pain.  She smoked until about 5 years  ago. Past Medical History:  Diagnosis Date  . Closed fracture of neck of left femur (HCC) 08/15/2018  . Hyperlipidemia   . Hypertension     Past Surgical History:  Procedure Laterality Date  . CATARACT EXTRACTION, BILATERAL    . KNEE SURGERY    . LEFT HEART CATH AND CORONARY ANGIOGRAPHY N/A 07/27/2020   Procedure: LEFT HEART CATH AND CORONARY ANGIOGRAPHY;  Surgeon: Swaziland, Peter M, MD;  Location: [facility] INVASIVE CV LAB;  Service: Cardiovascular;  Laterality: N/A;  . TOTAL HIP ARTHROPLASTY Left 08/15/2018   Procedure: TOTAL HIP ARTHROPLASTY ANTERIOR APPROACH;  Surgeon: Kathryne Hitch, MD;  Location: [facility] ORS;  Service: Orthopedics;  Laterality: Left;    Family History  Problem Relation Age of Onset  . Cancer Sister   . Heart disease Sister   . Heart attack Brother   . Heart disease Brother        s/p CABG  . Heart attack Father   . Heart disease Sister   . Heart attack Brother   . Heart disease Brother   2 brothers have had coronary bypass surgery and one brother had coronary bypass surgery 3 times  Social History Social History   Tobacco Use  . Smoking status: Former Smoker    Quit date: 06/13/2014    Years since quitting: 6.1  . Smokeless tobacco: Never Used  Substance Use Topics  . Alcohol use: Not Currently  . Drug use: Never  Current Outpatient Medications  Medication Sig Dispense Refill  . acetaminophen (TYLENOL) 650 MG CR tablet Take 1,300 mg by mouth 2 (two) times daily as needed for pain.    Marland Kitchen amLODipine (NORVASC) 10 MG tablet Take 10 mg by mouth daily.    Marland Kitchen aspirin EC 81 MG tablet Take 1 tablet (81 mg total) by mouth daily. Swallow whole. 30 tablet 11  . Cholecalciferol (VITAMIN D) 50 MCG (2000 UT) tablet Take 2,000 Units by mouth daily.    . Homeopathic Products (THERAWORX RELIEF EX) Apply 1 application topically daily as needed (pain).    . magnesium oxide (MAG-OX) 400 MG tablet Take 400 mg by mouth daily.    . metoprolol succinate (TOPROL XL) 25 MG  24 hr tablet Take 1 tablet (25 mg total) by mouth daily. 30 tablet 11  . OVER THE COUNTER MEDICATION Apply 1 application topically daily as needed (knee pain). veterinary liniment gel    . rosuvastatin (CRESTOR) 20 MG tablet Take 1 tablet (20 mg total) by mouth daily. 90 tablet 3  . Tetrahydrozoline HCl (VISINE OP) Place 1 drop into both eyes daily as needed (redness).    . traMADol (ULTRAM) 50 MG tablet TAKE 1-2 TABLETS BY MOUTH EVERY 6 HOURS AS NEEDED. (Patient taking differently: Take 50 mg by mouth every 6 (six) hours as needed for moderate pain.) 30 tablet 0   Current Facility-Administered Medications  Medication Dose Route Frequency Provider Last Rate Last Admin  . sodium chloride flush (NS) 0.9 % injection 3 mL  3 mL Intravenous Q12H Swaziland, Peter M, MD        Allergies  Allergen Reactions  . Ace Inhibitors Cough    Review of Systems  Constitutional: Positive for activity change. Negative for fatigue.  HENT: Negative.   Eyes: Negative.   Respiratory: Negative for shortness of breath.   Cardiovascular: Negative for chest pain and leg swelling.  Gastrointestinal: Negative.   Endocrine: Negative.   Genitourinary: Negative.   Musculoskeletal: Positive for arthralgias and joint swelling.  Skin: Negative.   Neurological: Negative for dizziness and syncope.  Hematological: Negative.   Psychiatric/Behavioral: The patient is nervous/anxious.     BP 110/64 (BP Location: Right Arm, Patient Position: Sitting, Cuff Size: Normal)   Pulse (!) 58   Temp 97.9 F (36.6 C) (Skin)   Resp 20   Ht 5\' 5"  (1.651 m)   Wt 162 lb 3.2 oz (73.6 kg)   SpO2 97% Comment: RA  BMI 26.99 kg/m  Physical Exam Constitutional:      Appearance: Normal appearance. She is normal weight.  HENT:     Head: Normocephalic and atraumatic.  Eyes:     Extraocular Movements: Extraocular movements intact.     Pupils: Pupils are equal, round, and reactive to light.  Cardiovascular:     Rate and Rhythm: Normal  rate and regular rhythm.     Pulses: Normal pulses.     Heart sounds: Normal heart sounds. No murmur heard.   Pulmonary:     Effort: Pulmonary effort is normal.     Breath sounds: Normal breath sounds.  Abdominal:     General: There is no distension.     Tenderness: There is no abdominal tenderness.  Musculoskeletal:        General: No swelling.     Cervical back: Normal range of motion and neck supple.  Skin:    General: Skin is warm and dry.  Neurological:     General: No focal deficit present.  Mental Status: She is alert and oriented to person, place, and time.  Psychiatric:        Mood and Affect: Mood normal.        Behavior: Behavior normal.      Diagnostic Tests:         *[place]*          *[facility]*              [address]             [place], [place] [address]               [phone]   -------------------------------------------------------------------  Transthoracic Echocardiography   Patient:  Debbie Cardenas, Debbie Cardenas  MR #:    [id number]  Study Date: 08/16/2018  Gender:   F  Age:    9  Height:   165.1 cm  Weight:   90.9 kg  BSA:    2.08 m^2  Pt. Status:  Room:    1345   ATTENDING  Tilden Fossa [id number]  PERFORMING  [facility], Inpatient  SONOGRAPHER [facility]  ADMITTING  Pokhrel, Laxman  ORDERING   Pokhrel, Laxman  REFERRING  Pokhrel, Laxman   cc:   -------------------------------------------------------------------  LV EF: 60% -  65%   -------------------------------------------------------------------  Indications:   (Hypertension).   -------------------------------------------------------------------  History:  PMH: No prior cardiac history.   -------------------------------------------------------------------  Study Conclusions   - Left ventricle: The cavity size was normal. There was mild  concentric  hypertrophy. Systolic function was normal. The  estimated ejection fraction was in the range of 60% to 65%. Wall  motion was normal; there were no regional wall motion  abnormalities. Doppler parameters are consistent with abnormal  left ventricular relaxation (grade 1 diastolic dysfunction).  There was no evidence of elevated ventricular filling pressure by  Doppler parameters.  - Aortic valve: Valve area (VTI): 2.27 cm^2. Valve area (Vmax): 2.1  cm^2. Valve area (Vmean): 2.24 cm^2.  - Mitral valve: Valve area by pressure half-time: 2.44 cm^2.  - Right ventricle: Systolic function was normal.  - Right atrium: The atrium was normal in size.  - Tricuspid valve: There was no regurgitation.  - Pulmonary arteries: Systolic pressure was within the normal  range.  - Inferior vena cava: The vessel was normal in size.  - Pericardium, extracardiac: There was no pericardial effusion.   -------------------------------------------------------------------  Study data: No prior study was available for comparison. Study  status: Routine. Procedure: The patient reported no pain pre or  post test. Transthoracic echocardiography. Image quality was  adequate. Study completion: There were no complications.  Transthoracic echocardiography. M-mode, complete 2D, spectral  Doppler, and color Doppler. Birthdate: Patient birthdate:  July 12, 1946. Age: Patient is 75 yr old. Sex: Gender: female.  BMI: 33.3 kg/m^2. Blood pressure:   148/70 Patient status:  Inpatient. Study date: Study date: 08/16/2018. Study time: 01:33  PM. Location: Bedside.   -------------------------------------------------------------------   -------------------------------------------------------------------  Left ventricle: The cavity size was normal. There was mild  concentric hypertrophy. Systolic function was normal. The estimated  ejection fraction was in the range of 60% to 65%. Wall motion was   normal; there were no regional wall motion abnormalities. Doppler  parameters are consistent with abnormal left ventricular relaxation  (grade 1 diastolic dysfunction). There was no evidence of elevated  ventricular filling pressure by Doppler parameters.   -------------------------------------------------------------------  Aortic valve:  Trileaflet; normal thickness leaflets. Mobility was  not restricted. Sclerosis without stenosis. Doppler:  Transvalvular velocity was minimally increased. There was no  stenosis. There was no regurgitation.  VTI ratio of LVOT to  aortic valve: 0.8. Valve area (VTI): 2.27 cm^2. Indexed valve area  (VTI): 1.09 cm^2/m^2. Peak velocity ratio of LVOT to aortic valve:  0.74. Valve area (Vmax): 2.1 cm^2. Indexed valve area (Vmax): 1.01  cm^2/m^2. Mean velocity ratio of LVOT to aortic valve: 0.79. Valve  area (Vmean): 2.24 cm^2. Indexed valve area (Vmean): 1.08 cm^2/m^2.   Mean gradient (S): 9 mm Hg. Peak gradient (S): 20 mm Hg.   -------------------------------------------------------------------  Aorta: Aortic root: The aortic root was normal in size.   -------------------------------------------------------------------  Mitral valve:  Structurally normal valve.  Mobility was not  restricted. Doppler: Transvalvular velocity was within the normal  range. There was no evidence for stenosis. There was no  regurgitation.  Valve area by pressure half-time: 2.44 cm^2.  Indexed valve area by pressure half-time: 1.17 cm^2/m^2.  Peak  gradient (D): 4 mm Hg.   -------------------------------------------------------------------  Left atrium: The atrium was normal in size.   -------------------------------------------------------------------  Right ventricle: The cavity size was normal. Wall thickness was  normal. Systolic function was normal.   -------------------------------------------------------------------  Pulmonic valve:  Structurally  normal valve.  Cusp separation was  normal. Doppler: Transvalvular velocity was within the normal  range. There was no evidence for stenosis. There was trivial  regurgitation.   -------------------------------------------------------------------  Tricuspid valve:  Structurally normal valve.  Doppler:  Transvalvular velocity was within the normal range. There was no  regurgitation.   -------------------------------------------------------------------  Pulmonary artery:  The main pulmonary artery was normal-sized.  Systolic pressure was within the normal range.   -------------------------------------------------------------------  Right atrium: The atrium was normal in size.   -------------------------------------------------------------------  Pericardium: There was no pericardial effusion.   -------------------------------------------------------------------  Systemic veins:  Inferior vena cava: The vessel was normal in size.   -------------------------------------------------------------------  Measurements   Left ventricle              Value     Reference  LV ID, ED, PLAX chordal         48.9 mm    43 - 52  LV ID, ES, PLAX chordal         30.2 mm    23 - 38  LV fx shortening, PLAX chordal      38  %    >=29  LV PW thickness, ED           12.2 mm    ----------  IVS/LV PW ratio, ED           1       <=1.3  Stroke volume, 2D            91  ml    ----------  Stroke volume/bsa, 2D          44  ml/m^2  ----------  LV e&', lateral              12.2 cm/s   ----------  LV E/e&', lateral             8.02      ----------  LV e&', medial              7.62 cm/s   ----------  LV E/e&', medial             12.85     ----------  LV e&', average  9.91 cm/s    ----------  LV E/e&', average             9.88      ----------    Ventricular septum            Value     Reference  IVS thickness, ED            12.2 mm    ----------    LVOT                   Value     Reference  LVOT ID, S                19  mm    ----------  LVOT area                2.84 cm^2   ----------  LVOT peak velocity, S          165  cm/s   ----------  LVOT mean velocity, S          112  cm/s   ----------  LVOT VTI, S               32.2 cm    ----------  LVOT peak gradient, S          11  mm Hg  ----------    Aortic valve               Value     Reference  Aortic valve peak velocity, S      223  cm/s   ----------  Aortic valve mean velocity, S      142  cm/s   ----------  Aortic valve VTI, S           40.2 cm    ----------  Aortic mean gradient, S         9   mm Hg  ----------  Aortic peak gradient, S         20  mm Hg  ----------  VTI ratio, LVOT/AV            0.8      ----------  Aortic valve area, VTI          2.27 cm^2   ----------  Aortic valve area/bsa, VTI        1.09 cm^2/m^2 ----------  Velocity ratio, peak, LVOT/AV      0.74      ----------  Aortic valve area, peak velocity     2.1  cm^2   ----------  Aortic valve area/bsa, peak       1.01 cm^2/m^2 ----------  velocity  Velocity ratio, mean, LVOT/AV      0.79      ----------  Aortic valve area, mean velocity     2.24 cm^2   ----------  Aortic valve area/bsa, mean       1.08 cm^2/m^2 ----------  velocity    Aorta                  Value     Reference  Aortic root ID, ED            26  mm    ----------    Left  atrium               Value     Reference  LA ID, A-P, ES              35  mm    ----------  LA ID/bsa, A-P              1.69 cm/m^2  <=2.2  LA volume, S               58.8 ml    ----------  LA volume/bsa, S             28.3 ml/m^2  ----------  LA volume, ES, 1-p A4C          58.2 ml    ----------  LA volume/bsa, ES, 1-p A4C        28  ml/m^2  ----------  LA volume, ES, 1-p A2C          59.2 ml    ----------  LA volume/bsa, ES, 1-p A2C        28.5 ml/m^2  ----------    Mitral valve               Value     Reference  Mitral E-wave peak velocity       97.9 cm/s   ----------  Mitral A-wave peak velocity       127  cm/s   ----------  Mitral deceleration time     (H)   306  ms    150 - 230  Mitral pressure half-time        90  ms    ----------  Mitral peak gradient, D         4   mm Hg  ----------  Mitral E/A ratio, peak          0.8      ----------  Mitral valve area, PHT, DP        2.44 cm^2   ----------  Mitral valve area/bsa, PHT, DP      1.17 cm^2/m^2 ----------    Right atrium               Value     Reference  RA ID, S-I, ES, A4C       (H)   54.3 mm    34 - 49  RA area, ES, A4C             18.2 cm^2   8.3 - 19.5  RA volume, ES, A/L            47.6 ml    ----------  RA volume/bsa, ES, A/L          22.9 ml/m^2  ----------    Systemic veins              Value     Reference  Estimated CVP              3   mm Hg  ----------    Right ventricle             Value     Reference  TAPSE                  30.6 mm    ----------  RV s&', lateral, S            19.8 cm/s    ----------   Legend:  (L) and (H) mark values outside specified reference range.   -------------------------------------------------------------------  Prepared and Electronically Authenticated by   Tobias AlexanderKatarina Nelson, M.D.  2020-01-18T14:27:29  Physicians  Panel Physicians Referring Physician Case Authorizing Physician  SwazilandJordan, Peter M, MD (Primary)      Procedures  LEFT HEART CATH  AND CORONARY ANGIOGRAPHY   Conclusion    Mid LM to Dist LM lesion is 70% stenosed.  Ost LAD to Prox LAD lesion is 50% stenosed.  Prox LAD to Mid LAD lesion is 70% stenosed.  1st Mrg lesion is 50% stenosed.  Dist Cx lesion is 30% stenosed with 30% stenosed side branch in LPAV.  Ost RCA to Prox RCA lesion is 90% stenosed.  The left ventricular systolic function is normal.  LV end diastolic pressure is normal.  The left ventricular ejection fraction is 55-65% by visual estimate.   1. 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 Normal LV function 3. Normal LVEDP  Plan: patient has high risk anatomy that is not suitable for PCI. Recommend CT surgery evaluation for CABG.     Recommendations  Antiplatelet/Anticoag Recommend Aspirin 81mg  daily for moderate CAD.   Indications  Angina pectoris (HCC) [I20.9 (ICD-10-CM)]   Procedural Details  Technical Details Indication: 74 yo WF with history of HTN, HLD and family history of CAD. Evaluated for chest pain. Coronary CTA is high risk with calcium score > 1000 and flow impairment noted in LAD and RCA territories.  Procedural Details: The right wrist was prepped, draped, and anesthetized with 1% lidocaine. Ultrasound was used to guide access. Image was obtained and stored in the record. Using the modified Seldinger technique, a 6 French slender sheath was introduced into the right radial artery. 3 mg of verapamil was administered through the sheath, weight-based unfractionated heparin was  administered intravenously. Standard Judkins catheters were used for selective coronary angiography and left ventriculography. Catheter exchanges were performed over an exchange length guidewire. There were no immediate procedural complications. A TR band was used for radial hemostasis at the completion of the procedure.  The patient was transferred to the post catheterization recovery area for further monitoring.  Contrast: 65 cc  Estimated blood loss <50 mL.   During this procedure medications were administered to achieve and maintain moderate conscious sedation while the patient's heart rate, blood pressure, and oxygen saturation were continuously monitored and I was present face-to-face 100% of this time.   Medications (Filter: Administrations occurring from 0753 to 0839 on 07/27/20) (important) Continuous medications are totaled by the amount administered until 07/27/20 0839.    Heparin (Porcine) in NaCl 1000-0.9 UT/500ML-% SOLN (mL) Total volume:  1,000 mL  Date/Time Rate/Dose/Volume Action   07/27/20 0804 500 mL Given   0804 500 mL Given    midazolam (VERSED) injection (mg) Total dose:  2 mg  Date/Time Rate/Dose/Volume Action   07/27/20 0809 2 mg Given    fentaNYL (SUBLIMAZE) injection (mcg) Total dose:  25 mcg  Date/Time Rate/Dose/Volume Action   07/27/20 0810 25 mcg Given    heparin sodium (porcine) injection (Units) Total dose:  4,000 Units  Date/Time Rate/Dose/Volume Action   07/27/20 0817 4,000 Units Given    Radial Cocktail/Verapamil only (mL) Total volume:  10 mL  Date/Time Rate/Dose/Volume Action   07/27/20 0814 10 mL Given    lidocaine (PF) (XYLOCAINE) 1 % injection (mL) Total volume:  2 mL  Date/Time Rate/Dose/Volume Action   07/27/20 0812 2 mL Given    iohexol (OMNIPAQUE) 350 MG/ML injection (mL) Total volume:  65 mL  Date/Time Rate/Dose/Volume Action   07/27/20 0838 65 mL Given    Sedation Time  Sedation Time Physician-1: 25 minutes 32  seconds   Contrast  Medication Name Total Dose  iohexol (OMNIPAQUE) 350 MG/ML injection 65  mL    Radiation/Fluoro  Fluoro time: 6 (min) DAP: 50932 (mGycm2) Cumulative Air Kerma: 671 (mGy)   Complications   Complications documented before study signed (07/27/2020 2:45 AM)    No complications were associated with this study.  Documented by Martinique, Peter M, MD - 07/27/2020 8:42 AM     Coronary Findings   Diagnostic Dominance: Co-dominant  Left Main  Mid LM to Dist LM lesion is 70% stenosed. The lesion is severely calcified.  Left Anterior Descending  Ost LAD to Prox LAD lesion is 50% stenosed. The lesion is severely calcified.  Prox LAD to Mid LAD lesion is 70% stenosed. The lesion is severely calcified.  Left Circumflex  Dist Cx lesion is 30% stenosed with 30% stenosed side branch in LPAV.  First Obtuse Marginal Branch  1st Mrg lesion is 50% stenosed.  Right Coronary Artery  Ost RCA to Prox RCA lesion is 90% stenosed. The lesion is severely calcified.   Intervention   No interventions have been documented.  Wall Motion  Resting       All segments of the heart are normal.           Left Heart  Left Ventricle The left ventricular size is normal. The left ventricular systolic function is normal. LV end diastolic pressure is normal. The left ventricular ejection fraction is 55-65% by visual estimate. No regional wall motion abnormalities.   Coronary Diagrams   Diagnostic Dominance: Co-dominant    Intervention    Implants    No implant documentation for this case.   Syngo Images  Show images for CARDIAC CATHETERIZATION  Images on Long Term Storage  Show images for Josalynn, Johndrow to Procedure Log  Procedure Log     Hemo Data  Flowsheet Row Most Recent Value  AO Systolic Pressure 809 mmHg  AO Diastolic Pressure 54 mmHg  AO Mean 86 mmHg  LV Systolic Pressure 983 mmHg  LV Diastolic Pressure 0 mmHg  LV EDP 6 mmHg   AOp Systolic Pressure 382 mmHg  AOp Diastolic Pressure 55 mmHg  AOp Mean Pressure 86 mmHg  LVp Systolic Pressure 505 mmHg  LVp Diastolic Pressure 6 mmHg  LVp EDP Pressure 9 mmHg      Impression:  This 75 year old woman has high-grade left main and ostial RCA stenosis as well as significant proximal to mid LAD stenosis with heavily calcification in the areas of stenosis.  I agree that coronary artery bypass graft surgery is the best treatment event further ischemia and infarction. I discussed the operative procedure with the patient and her husband including alternatives, benefits and risks; including but not limited to bleeding, blood transfusion, infection, stroke, myocardial infarction, graft failure, heart block requiring a permanent pacemaker, organ dysfunction, and death.  Adonis Brook understands and agrees to proceed.     Plan:  We will schedule coronary bypass graft surgery on Monday, 08/15/2020.  I spent 50 minutes performing this consultation and > 50% of this time was spent face to face counseling and coordinating the care of this patient's severe multivessel coronary disease   Gaye Pollack, MD [facility] [phone]

## 2020-08-05 ENCOUNTER — Encounter: Payer: Self-pay | Admitting: *Deleted

## 2020-08-10 NOTE — Progress Notes (Signed)
Your procedure is scheduled on Monday, January 17th.  Report to [facility] Main Entrance "A" at 5:30 A.M., and check in at the Admitting office.  Call this number if you have problems the morning of surgery:  [phone]  Call [phone] if you have any questions prior to your surgery date Monday-Friday 8am-4pm   Remember:  Do not eat or drink after midnight the night before your surgery    Take these medicines the morning of surgery with A SIP OF WATER  amLODipine (NORVASC)  metoprolol succinate (TOPROL XL)  rosuvastatin (CRESTOR)   If needed: acetaminophen (TYLENOL), traMADol (ULTRAM)   Follow your surgeon's instructions on when to stop Aspirin.  If no instructions were given by your surgeon then you will need to call the office to get those instructions.    As of today, STOP taking Aleve, Naproxen, Ibuprofen, Motrin, Advil, Goody's, BC's, all herbal medications, fish oil, and all vitamins.                     Do not wear jewelry, make up, or nail polish            Do not wear lotions, powders, perfumes or deodorant.            Do not shave 48 hours prior to surgery.              Do not bring valuables to the hospital.            [facility] is not responsible for any belongings or valuables.  Do NOT Smoke (Tobacco/Vaping) or drink Alcohol 24 hours prior to your procedure If you use a CPAP at night, you may bring all equipment for your overnight stay.   Contacts, glasses, dentures or bridgework may not be worn into surgery.      For patients admitted to the hospital, discharge time will be determined by your treatment team.   Patients discharged the day of surgery will not be allowed to drive home, and someone needs to stay with them for 24 hours  Special instructions:   [place]- Preparing For Surgery  Before surgery, you can play an important role. Because skin is not sterile, your skin needs to be as free of germs as possible. You can reduce the number of germs on  your skin by washing with CHG (chlorahexidine gluconate) Soap before surgery.  CHG is an antiseptic cleaner which kills germs and bonds with the skin to continue killing germs even after washing.    Oral Hygiene is also important to reduce your risk of infection.  Remember - BRUSH YOUR TEETH THE MORNING OF SURGERY WITH YOUR REGULAR TOOTHPASTE  Please do not use if you have an allergy to CHG or antibacterial soaps. If your skin becomes reddened/irritated stop using the CHG.  Do not shave (including legs and underarms) for at least 48 hours prior to first CHG shower. It is OK to shave your face.  Please follow these instructions carefully.   1. Shower the NIGHT BEFORE SURGERY and the MORNING OF SURGERY with CHG Soap.   2. If you chose to wash your hair, wash your hair first as usual with your normal shampoo.  3. After you shampoo, rinse your hair and body thoroughly to remove the shampoo.  4. Use CHG as you would any other liquid soap. You can apply CHG directly to the skin and wash gently with a scrungie or a clean washcloth.   5. Apply the  CHG Soap to your body ONLY FROM THE NECK DOWN.  Do not use on open wounds or open sores. Avoid contact with your eyes, ears, mouth and genitals (private parts). Wash Face and genitals (private parts)  with your normal soap.   6. Wash thoroughly, paying special attention to the area where your surgery will be performed.  7. Thoroughly rinse your body with warm water from the neck down.  8. DO NOT shower/wash with your normal soap after using and rinsing off the CHG Soap.  9. Pat yourself dry with a CLEAN TOWEL.  10. Wear CLEAN PAJAMAS to bed the night before surgery  11. Place CLEAN SHEETS on your bed the night of your first shower and DO NOT SLEEP WITH PETS.  Day of Surgery: Wear Clean/Comfortable clothing the morning of surgery Do not apply any deodorants/lotions.   Remember to brush your teeth WITH YOUR REGULAR TOOTHPASTE.   Please read over  the following fact sheets that you were given.

## 2020-08-11 ENCOUNTER — Other Ambulatory Visit: Payer: Self-pay

## 2020-08-11 ENCOUNTER — Encounter (HOSPITAL_COMMUNITY)
Admission: RE | Admit: 2020-08-11 | Discharge: 2020-08-11 | Disposition: A | Discharge status: Home / Self Care | Payer: Medicare Other | Source: Ambulatory Visit | Attending: Surgery | Admitting: Surgery

## 2020-08-11 ENCOUNTER — Ambulatory Visit (HOSPITAL_COMMUNITY)
Admission: RE | Admit: 2020-08-11 | Discharge: 2020-08-11 | Disposition: A | Discharge status: Home / Self Care | Payer: Medicare Other | Source: Ambulatory Visit | Attending: Surgery | Admitting: Surgery

## 2020-08-11 ENCOUNTER — Other Ambulatory Visit (HOSPITAL_COMMUNITY): Payer: Medicare Other

## 2020-08-11 ENCOUNTER — Other Ambulatory Visit (HOSPITAL_COMMUNITY)
Admission: RE | Admit: 2020-08-11 | Discharge: 2020-08-11 | Disposition: A | Discharge status: Home / Self Care | Payer: Medicare Other | Source: Ambulatory Visit | Attending: Surgery | Admitting: Surgery

## 2020-08-11 ENCOUNTER — Encounter (HOSPITAL_COMMUNITY): Payer: Self-pay

## 2020-08-11 DIAGNOSIS — Z01818 Encounter for other preprocedural examination: Secondary | ICD-10-CM

## 2020-08-11 DIAGNOSIS — Z87891 Personal history of nicotine dependence: Secondary | ICD-10-CM | POA: Diagnosis not present

## 2020-08-11 DIAGNOSIS — I2510 Atherosclerotic heart disease of native coronary artery without angina pectoris: Secondary | ICD-10-CM | POA: Diagnosis not present

## 2020-08-11 DIAGNOSIS — Z20822 Contact with and (suspected) exposure to covid-19: Secondary | ICD-10-CM | POA: Insufficient documentation

## 2020-08-11 DIAGNOSIS — I10 Essential (primary) hypertension: Secondary | ICD-10-CM | POA: Insufficient documentation

## 2020-08-11 HISTORY — DX: Pneumonia, unspecified organism: J18.9

## 2020-08-11 HISTORY — DX: Anxiety disorder, unspecified: F41.9

## 2020-08-11 HISTORY — DX: Unspecified osteoarthritis, unspecified site: M19.90

## 2020-08-11 HISTORY — DX: Atherosclerotic heart disease of native coronary artery without angina pectoris: I25.10

## 2020-08-11 LAB — URINALYSIS, ROUTINE W REFLEX MICROSCOPIC
Bilirubin Urine: NEGATIVE
Glucose, UA: NEGATIVE mg/dL
Hgb urine dipstick: NEGATIVE
Ketones, ur: NEGATIVE mg/dL
Leukocytes,Ua: NEGATIVE
Nitrite: NEGATIVE
Protein, ur: 100 mg/dL — AB
Specific Gravity, Urine: 1.021 (ref 1.005–1.030)
pH: 5 (ref 5.0–8.0)

## 2020-08-11 LAB — COMPREHENSIVE METABOLIC PANEL
ALT: 13 U/L (ref 0–44)
AST: 18 U/L (ref 15–41)
Albumin: 4.3 g/dL (ref 3.5–5.0)
Alkaline Phosphatase: 60 U/L (ref 38–126)
Anion gap: 12 (ref 5–15)
BUN: 16 mg/dL (ref 8–23)
CO2: 23 mmol/L (ref 22–32)
Calcium: 9.6 mg/dL (ref 8.9–10.3)
Chloride: 105 mmol/L (ref 98–111)
Creatinine, Ser: 1.09 mg/dL — ABNORMAL HIGH (ref 0.44–1.00)
GFR, Estimated: 54 mL/min — ABNORMAL LOW (ref >60)
Glucose, Bld: 102 mg/dL — ABNORMAL HIGH (ref 70–99)
Potassium: 3.9 mmol/L (ref 3.5–5.1)
Sodium: 140 mmol/L (ref 135–145)
Total Bilirubin: 1.1 mg/dL (ref 0.3–1.2)
Total Protein: 7.4 g/dL (ref 6.5–8.1)

## 2020-08-11 LAB — CBC
HCT: 37.7 % (ref 36.0–46.0)
Hemoglobin: 13.2 g/dL (ref 12.0–15.0)
MCH: 31.6 pg (ref 26.0–34.0)
MCHC: 35 g/dL (ref 30.0–36.0)
MCV: 90.2 fL (ref 80.0–100.0)
Platelets: 308 10*3/uL (ref 150–400)
RBC: 4.18 MIL/uL (ref 3.87–5.11)
RDW: 13 % (ref 11.5–15.5)
WBC: 7.3 10*3/uL (ref 4.0–10.5)
nRBC: 0 % (ref 0.0–0.2)

## 2020-08-11 LAB — BLOOD GAS, ARTERIAL
Acid-base deficit: 0.2 mmol/L (ref 0.0–2.0)
Bicarbonate: 23.9 mmol/L (ref 20.0–28.0)
Drawn by: 602861
FIO2: 21
O2 Saturation: 98.6 %
Patient temperature: 37
pCO2 arterial: 38.4 mmHg (ref 32.0–48.0)
pH, Arterial: 7.411 (ref 7.350–7.450)
pO2, Arterial: 114 mmHg — ABNORMAL HIGH (ref 83.0–108.0)

## 2020-08-11 LAB — TYPE AND SCREEN
ABO/RH(D): O POS
Antibody Screen: NEGATIVE

## 2020-08-11 LAB — SURGICAL PCR SCREEN
MRSA, PCR: NEGATIVE
Staphylococcus aureus: NEGATIVE

## 2020-08-11 LAB — APTT: aPTT: 31 seconds (ref 24–36)

## 2020-08-11 LAB — HEMOGLOBIN A1C
Hgb A1c MFr Bld: 4.6 % — ABNORMAL LOW (ref 4.8–5.6)
Mean Plasma Glucose: 85.32 mg/dL

## 2020-08-11 LAB — PROTIME-INR
INR: 1 (ref 0.8–1.2)
Prothrombin Time: 12.7 seconds (ref 11.4–15.2)

## 2020-08-11 NOTE — Progress Notes (Signed)
PCP - Porfirio Oar, PA Cardiologist - Dr. Peter [place]  Chest x-ray - today EKG - today ECHO - 08/16/18 Cardiac Cath - 07/27/20  Aspirin Instructions: to continue Aspirin, do not take day of surgery  COVID TEST- scheduled for today   Anesthesia review: No  Patient denies shortness of breath, fever, cough and chest pain at PAT appointment   All instructions explained to the patient, with a verbal understanding of the material. Patient agrees to go over the instructions while at home for a better understanding. Patient also instructed to self quarantine after being tested for COVID-19. The opportunity to ask questions was provided.

## 2020-08-11 NOTE — Pre-Procedure Instructions (Signed)
Debbie Cardenas  08/11/2020    Your procedure is scheduled on Monday, August 15, 2020 at 7:30 AM.   Report to [facility] Entrance "A" Admitting Office at 5:30 AM.   Call this number if you have problems the morning of surgery: [phone]   Questions prior to day of surgery, please call [phone] between 8 & 4 PM.   Remember:  Do not eat or drink after midnight Sunday, 08/14/20.  Take these medicines the morning of surgery with A SIP OF WATER: Amlodipine (Norvasc), Metoprolol (Toprol XL), Rosuvastatin (Crestor), Tramadol or Acetaminophen (Tylenol) - if needed  Do not take your Aspirin the morning of surgery. Do not use NSAIDS (Ibuprofen, Aleve, etc), Fish Oil, Herbal medications, other Aspirin containing products or Vitamins prior to surgery.    Do not wear jewelry, make-up or nail polish.  Do not wear lotions, powders, perfumes or deodorant.  Do not shave 48 hours prior to surgery.   Do not bring valuables to the hospital.  [facility] is not responsible for any belongings or valuables.  Contacts, dentures or bridgework may not be worn into surgery.  Leave your suitcase in the car.  After surgery it may be brought to your room.  For patients admitted to the hospital, discharge time will be determined by your treatment team.  [facility] - Preparing for Surgery  Before surgery, you can play an important role.  Because skin is not sterile, your skin needs to be as free of germs as possible.  You can reduce the number of germs on you skin by washing with CHG (chlorahexidine gluconate) soap before surgery.  CHG is an antiseptic cleaner which kills germs and bonds with the skin to continue killing germs even after washing.  Oral Hygiene is also important in reducing the risk of infection.  Remember to brush your teeth with your regular toothpaste the morning of surgery.  Please DO NOT use if you have an allergy to CHG or antibacterial soaps.  If your skin becomes  reddened/irritated stop using the CHG and inform your nurse when you arrive at Short Stay.  Do not shave (including legs and underarms) for at least 48 hours prior to the first CHG shower.  You may shave your face.  Please follow these instructions carefully:   1.  Shower with CHG Soap the night before surgery and the morning of Surgery.  2.  If you choose to wash your hair, wash your hair first as usual with your normal shampoo.  3.  After you shampoo, rinse your hair and body thoroughly to remove the shampoo. 4.  Use CHG as you would any other liquid soap.  You can apply chg directly to the skin and wash gently with a      scrungie or washcloth.           5.  Apply the CHG Soap to your body ONLY FROM THE NECK DOWN.   Do not use on open wounds or open sores. Avoid contact with your eyes, ears, mouth and genitals (private parts).  Wash genitals (private parts) with your normal soap - do this prior to using CHG soap.  6.  Wash thoroughly, paying special attention to the area where your surgery will be performed.  7.  Thoroughly rinse your body with warm water from the neck down.  8.  DO NOT shower/wash with your normal soap after using and rinsing off the CHG Soap.  9.  Pat yourself dry with a  clean towel.            10.  Wear clean pajamas.            11.  Place clean sheets on your bed the night of your first shower and do not sleep with pets.  Day of Surgery  Shower as above. Do not apply any lotions/deodorants the morning of surgery.   Please wear clean clothes to the hospital. Remember to brush your teeth with toothpaste.  Please read over the fact sheets that you were given.

## 2020-08-12 LAB — SARS CORONAVIRUS 2 (TAT 6-24 HRS): SARS Coronavirus 2: NEGATIVE

## 2020-08-12 MED ORDER — MAGNESIUM SULFATE 50 % IJ SOLN
40.0000 meq | INTRAMUSCULAR | Status: DC
  Filled 2020-08-12: qty 9.85

## 2020-08-12 MED ORDER — MILRINONE LACTATE IN DEXTROSE 20-5 MG/100ML-% IV SOLN
0.3000 ug/kg/min | INTRAVENOUS | Status: DC
  Filled 2020-08-12: qty 100

## 2020-08-12 MED ORDER — TRANEXAMIC ACID (OHS) PUMP PRIME SOLUTION
2.0000 mg/kg | INTRAVENOUS | Status: DC
  Filled 2020-08-12: qty 1.45

## 2020-08-12 MED ORDER — SODIUM CHLORIDE 0.9 % IV SOLN
1.5000 g | INTRAVENOUS | Status: AC
  Administered 2020-08-15: 1.5 g via INTRAVENOUS
  Filled 2020-08-12: qty 1.5

## 2020-08-12 MED ORDER — EPINEPHRINE HCL 5 MG/250ML IV SOLN IN NS
0.0000 ug/min | INTRAVENOUS | Status: DC
  Filled 2020-08-12: qty 250

## 2020-08-12 MED ORDER — NITROGLYCERIN IN D5W 200-5 MCG/ML-% IV SOLN
2.0000 ug/min | INTRAVENOUS | Status: AC
  Administered 2020-08-15: 10 ug/min via INTRAVENOUS
  Filled 2020-08-12: qty 250

## 2020-08-12 MED ORDER — PHENYLEPHRINE HCL-NACL 20-0.9 MG/250ML-% IV SOLN
30.0000 ug/min | INTRAVENOUS | Status: AC
  Administered 2020-08-15: 15 ug/min via INTRAVENOUS
  Administered 2020-08-15: 10 ug/min via INTRAVENOUS
  Filled 2020-08-12: qty 250

## 2020-08-12 MED ORDER — NOREPINEPHRINE 4 MG/250ML-% IV SOLN
0.0000 ug/min | INTRAVENOUS | Status: DC
  Filled 2020-08-12: qty 250

## 2020-08-12 MED ORDER — TRANEXAMIC ACID 1000 MG/10ML IV SOLN
1.5000 mg/kg/h | INTRAVENOUS | Status: AC
  Administered 2020-08-15: 1.5 mg/kg/h via INTRAVENOUS
  Filled 2020-08-12: qty 25

## 2020-08-12 MED ORDER — SODIUM CHLORIDE 0.9 % IV SOLN
750.0000 mg | INTRAVENOUS | Status: AC
  Administered 2020-08-15: 750 mg via INTRAVENOUS
  Filled 2020-08-12: qty 750

## 2020-08-12 MED ORDER — VANCOMYCIN HCL 1250 MG/250ML IV SOLN
1250.0000 mg | INTRAVENOUS | Status: AC
  Administered 2020-08-15: 1250 mg via INTRAVENOUS
  Filled 2020-08-12: qty 250

## 2020-08-12 MED ORDER — POTASSIUM CHLORIDE 2 MEQ/ML IV SOLN
80.0000 meq | INTRAVENOUS | Status: DC
  Filled 2020-08-12: qty 40

## 2020-08-12 MED ORDER — TRANEXAMIC ACID (OHS) BOLUS VIA INFUSION
15.0000 mg/kg | INTRAVENOUS | Status: AC
  Administered 2020-08-15: 1089 mg via INTRAVENOUS
  Filled 2020-08-12: qty 1089

## 2020-08-12 MED ORDER — INSULIN REGULAR(HUMAN) IN NACL 100-0.9 UT/100ML-% IV SOLN
INTRAVENOUS | Status: AC
  Administered 2020-08-15: 1.1 [IU]/h via INTRAVENOUS
  Filled 2020-08-12: qty 100

## 2020-08-12 MED ORDER — DEXMEDETOMIDINE HCL IN NACL 400 MCG/100ML IV SOLN
0.1000 ug/kg/h | INTRAVENOUS | Status: AC
  Administered 2020-08-15: .7 ug/kg/h via INTRAVENOUS
  Filled 2020-08-12: qty 100

## 2020-08-12 MED ORDER — PLASMA-LYTE 148 IV SOLN
INTRAVENOUS | Status: DC
  Filled 2020-08-12: qty 2.5

## 2020-08-12 MED ORDER — SODIUM CHLORIDE 0.9 % IV SOLN
INTRAVENOUS | Status: DC
  Filled 2020-08-12: qty 30

## 2020-08-12 NOTE — H&P (Signed)
[address]       Deb[place] Carden[place] [address]             [phone]      Cardiothoracic Surgery Admission History and Physical   PCP is Porfirio Oar, PA Referring Provider is Porfirio Oar, [place] Primary cardiologist: Peter Swaziland, MD     Chief Complaint  Patient presents with  . Coronary Artery Disease        HPI:  The patient is a 75 year old woman with a history of hypertension, hyperlipidemia, a strong family history of coronary disease, former smoker and degenerative arthritis of both knees who reports having some twinges of pain in her left breast area in August 2021.  This was not particularly related to exertion but was associated with some pauses in her heart rhythm.  She notes that after these episodes she felt very fatigued.  She underwent a coronary CTA to stratify her risk and calcium score was over 1000.  There was suggestion of flow-limiting stenosis in the RCA and LAD territories although analysis was difficult due to heavy calcification of the coronary arteries.  It was felt to be a high risk scan.  She had an event monitor placed which was normal.  Cardiac catheterization on 07/27/2020 showed 70% mid to distal left main stenosis.  There is 50% ostial to proximal LAD stenosis and then 70% proximal to mid LAD stenosis.  There is a moderate-sized diagonal branch that came off in this area of stenosis.  The first marginal had at least 50% stenosis.  The ostium of the RCA had 90% stenosis.  Left ventricular systolic function was normal with ejection fraction estimated 55 to 60%.  LVEDP was normal.   She lives with her husband who has had bypass surgery in the past by Dr. Tyrone Sage.  She said she has had no further chest discomfort and denies any shortness of breath or fatigue but has not been very active due to her degenerative knee arthritis.  She recently injured her right knee when the dog ran into her and has had a lot of pain and not been very active.   She is taking Ultram as needed for pain.  She smoked until about 5 years ago.     Past Medical History:  Diagnosis Date  . Closed fracture of neck of left femur (HCC) 08/15/2018  . Hyperlipidemia   . Hypertension          Past Surgical History:  Procedure Laterality Date  . CATARACT EXTRACTION, BILATERAL    . KNEE SURGERY    . LEFT HEART CATH AND CORONARY ANGIOGRAPHY N/A 07/27/2020   Procedure: LEFT HEART CATH AND CORONARY ANGIOGRAPHY;  Surgeon: Swaziland, Peter M, MD;  Location: [facility];  Service: Cardiovascular;  Laterality: N/A;  . TOTAL HIP ARTHROPLASTY Left 08/15/2018   Procedure: TOTAL HIP ARTHROPLASTY ANTERIOR APPROACH;  Surgeon: Kathryne Hitch, MD;  Location: [facility];  Service: Orthopedics;  Laterality: Left;         Family History  Problem Relation Age of Onset  . Cancer Sister   . Heart disease Sister   . Heart attack Brother   . Heart disease Brother        s/p CABG  . Heart attack Father   . Heart disease Sister   . Heart attack Brother   . Heart disease Brother   2 brothers have had coronary bypass surgery and one brother had coronary bypass surgery 3 times  Social History Social History   Tobacco Use  . Smoking status: Former Smoker    Quit date: 06/13/2014    Years since quitting: 6.1  . Smokeless tobacco: Never Used  Substance Use Topics  . Alcohol use: Not Currently  . Drug use: Never          Current Outpatient Medications  Medication Sig Dispense Refill  . acetaminophen (TYLENOL) 650 MG CR tablet Take 1,300 mg by mouth 2 (two) times daily as needed for pain.    Marland Kitchen. amLODipine (NORVASC) 10 MG tablet Take 10 mg by mouth daily.    Marland Kitchen. aspirin EC 81 MG tablet Take 1 tablet (81 mg total) by mouth daily. Swallow whole. 30 tablet 11  . Cholecalciferol (VITAMIN D) 50 MCG (2000 UT) tablet Take 2,000 Units by mouth daily.    . Homeopathic Products (THERAWORX RELIEF EX) Apply 1 application topically daily as  needed (pain).    . magnesium oxide (MAG-OX) 400 MG tablet Take 400 mg by mouth daily.    . metoprolol succinate (TOPROL XL) 25 MG 24 hr tablet Take 1 tablet (25 mg total) by mouth daily. 30 tablet 11  . OVER THE COUNTER MEDICATION Apply 1 application topically daily as needed (knee pain). veterinary liniment gel    . rosuvastatin (CRESTOR) 20 MG tablet Take 1 tablet (20 mg total) by mouth daily. 90 tablet 3  . Tetrahydrozoline HCl (VISINE OP) Place 1 drop into both eyes daily as needed (redness).    . traMADol (ULTRAM) 50 MG tablet TAKE 1-2 TABLETS BY MOUTH EVERY 6 HOURS AS NEEDED. (Patient taking differently: Take 50 mg by mouth every 6 (six) hours as needed for moderate pain.) 30 tablet 0            Current Facility-Administered Medications  Medication Dose Route Frequency Provider Last Rate Last Admin  . sodium chloride flush (NS) 0.9 % injection 3 mL  3 mL Intravenous Q12H SwazilandJordan, Peter M, MD            Allergies  Allergen Reactions  . Ace Inhibitors Cough    Review of Systems  Constitutional: Positive for activity change. Negative for fatigue.  HENT: Negative.   Eyes: Negative.   Respiratory: Negative for shortness of breath.   Cardiovascular: Negative for chest pain and leg swelling.  Gastrointestinal: Negative.   Endocrine: Negative.   Genitourinary: Negative.   Musculoskeletal: Positive for arthralgias and joint swelling.  Skin: Negative.   Neurological: Negative for dizziness and syncope.  Hematological: Negative.   Psychiatric/Behavioral: The patient is nervous/anxious.     BP 110/64 (BP Location: Right Arm, Patient Position: Sitting, Cuff Size: Normal)   Pulse (!) 58   Temp 97.9 F (36.6 C) (Skin)   Resp 20   Ht 5\' 5"  (1.651 m)   Wt 162 lb 3.2 oz (73.6 kg)   SpO2 97% Comment: RA  BMI 26.99 kg/m  Physical Exam Constitutional:      Appearance: Normal appearance. She is normal weight.  HENT:     Head: Normocephalic and atraumatic.  Eyes:      Extraocular Movements: Extraocular movements intact.     Pupils: Pupils are equal, round, and reactive to light.  Cardiovascular:     Rate and Rhythm: Normal rate and regular rhythm.     Pulses: Normal pulses.     Heart sounds: Normal heart sounds. No murmur heard.   Pulmonary:     Effort: Pulmonary effort is normal.     Breath sounds: Normal  breath sounds.  Abdominal:     General: There is no distension.     Tenderness: There is no abdominal tenderness.  Musculoskeletal:        General: No swelling.     Cervical back: Normal range of motion and neck supple.  Skin:    General: Skin is warm and dry.  Neurological:     General: No focal deficit present.     Mental Status: She is alert and oriented to person, place, and time.  Psychiatric:        Mood and Affect: Mood normal.        Behavior: Behavior normal.      Diagnostic Tests:         *[place]*          *[facility]*              [address]             [place], [place] [address]               [phone]   -------------------------------------------------------------------  Transthoracic Echocardiography   Patient:  Debbie Cardenas, Debbie Cardenas  MR #:    [id number]  Study Date: 08/16/2018  Gender:   F  Age:    37  Height:   165.1 cm  Weight:   90.9 kg  BSA:    2.08 m^2  Pt. Status:  Room:    1345   ATTENDING  Tilden Fossa [id number]  PERFORMING  [facility], Inpatient  SONOGRAPHER [facility]  ADMITTING  Pokhrel, Laxman  ORDERING   Pokhrel, Laxman  REFERRING  Pokhrel, Laxman   cc:   -------------------------------------------------------------------  LV EF: 60% -  65%   -------------------------------------------------------------------  Indications:   (Hypertension).   -------------------------------------------------------------------  History:  PMH: No prior cardiac history.    -------------------------------------------------------------------  Study Conclusions   - Left ventricle: The cavity size was normal. There was mild  concentric hypertrophy. Systolic function was normal. The  estimated ejection fraction was in the range of 60% to 65%. Wall  motion was normal; there were no regional wall motion  abnormalities. Doppler parameters are consistent with abnormal  left ventricular relaxation (grade 1 diastolic dysfunction).  There was no evidence of elevated ventricular filling pressure by  Doppler parameters.  - Aortic valve: Valve area (VTI): 2.27 cm^2. Valve area (Vmax): 2.1  cm^2. Valve area (Vmean): 2.24 cm^2.  - Mitral valve: Valve area by pressure half-time: 2.44 cm^2.  - Right ventricle: Systolic function was normal.  - Right atrium: The atrium was normal in size.  - Tricuspid valve: There was no regurgitation.  - Pulmonary arteries: Systolic pressure was within the normal  range.  - Inferior vena cava: The vessel was normal in size.  - Pericardium, extracardiac: There was no pericardial effusion.   -------------------------------------------------------------------  Study data: No prior study was available for comparison. Study  status: Routine. Procedure: The patient reported no pain pre or  post test. Transthoracic echocardiography. Image quality was  adequate. Study completion: There were no complications.  Transthoracic echocardiography. M-mode, complete 2D, spectral  Doppler, and color Doppler. Birthdate: Patient birthdate:  18-Oct-1945. Age: Patient is 75 yr old. Sex: Gender: female.  BMI: 33.3 kg/m^2. Blood pressure:   148/70 Patient status:  Inpatient. Study date: Study date: 08/16/2018. Study time: 01:33  PM. Location: Bedside.   -------------------------------------------------------------------   -------------------------------------------------------------------  Left ventricle: The  cavity size was normal. There was mild  concentric hypertrophy. Systolic function was  normal. The estimated  ejection fraction was in the range of 60% to 65%. Wall motion was  normal; there were no regional wall motion abnormalities. Doppler  parameters are consistent with abnormal left ventricular relaxation  (grade 1 diastolic dysfunction). There was no evidence of elevated  ventricular filling pressure by Doppler parameters.   -------------------------------------------------------------------  Aortic valve:  Trileaflet; normal thickness leaflets. Mobility was  not restricted. Sclerosis without stenosis. Doppler:  Transvalvular velocity was minimally increased. There was no  stenosis. There was no regurgitation.  VTI ratio of LVOT to  aortic valve: 0.8. Valve area (VTI): 2.27 cm^2. Indexed valve area  (VTI): 1.09 cm^2/m^2. Peak velocity ratio of LVOT to aortic valve:  0.74. Valve area (Vmax): 2.1 cm^2. Indexed valve area (Vmax): 1.01  cm^2/m^2. Mean velocity ratio of LVOT to aortic valve: 0.79. Valve  area (Vmean): 2.24 cm^2. Indexed valve area (Vmean): 1.08 cm^2/m^2.   Mean gradient (S): 9 mm Hg. Peak gradient (S): 20 mm Hg.   -------------------------------------------------------------------  Aorta: Aortic root: The aortic root was normal in size.   -------------------------------------------------------------------  Mitral valve:  Structurally normal valve.  Mobility was not  restricted. Doppler: Transvalvular velocity was within the normal  range. There was no evidence for stenosis. There was no  regurgitation.  Valve area by pressure half-time: 2.44 cm^2.  Indexed valve area by pressure half-time: 1.17 cm^2/m^2.  Peak  gradient (D): 4 mm Hg.   -------------------------------------------------------------------  Left atrium: The atrium was normal in size.   -------------------------------------------------------------------  Right ventricle: The cavity size  was normal. Wall thickness was  normal. Systolic function was normal.   -------------------------------------------------------------------  Pulmonic valve:  Structurally normal valve.  Cusp separation was  normal. Doppler: Transvalvular velocity was within the normal  range. There was no evidence for stenosis. There was trivial  regurgitation.   -------------------------------------------------------------------  Tricuspid valve:  Structurally normal valve.  Doppler:  Transvalvular velocity was within the normal range. There was no  regurgitation.   -------------------------------------------------------------------  Pulmonary artery:  The main pulmonary artery was normal-sized.  Systolic pressure was within the normal range.   -------------------------------------------------------------------  Right atrium: The atrium was normal in size.   -------------------------------------------------------------------  Pericardium: There was no pericardial effusion.   -------------------------------------------------------------------  Systemic veins:  Inferior vena cava: The vessel was normal in size.   -------------------------------------------------------------------  Measurements   Left ventricle              Value     Reference  LV ID, ED, PLAX chordal         48.9 mm    43 - 52  LV ID, ES, PLAX chordal         30.2 mm    23 - 38  LV fx shortening, PLAX chordal      38  %    >=29  LV PW thickness, ED           12.2 mm    ----------  IVS/LV PW ratio, ED           1       <=1.3  Stroke volume, 2D            91  ml    ----------  Stroke volume/bsa, 2D          44  ml/m^2  ----------  LV e&', lateral              12.2 cm/s   ----------  LV E/e&', lateral  8.02      ----------  LV e&', medial               7.62 cm/s   ----------  LV E/e&', medial             12.85     ----------  LV e&', average              9.91 cm/s   ----------  LV E/e&', average             9.88      ----------    Ventricular septum            Value     Reference  IVS thickness, ED            12.2 mm    ----------    LVOT                   Value     Reference  LVOT ID, S                19  mm    ----------  LVOT area                2.84 cm^2   ----------  LVOT peak velocity, S          165  cm/s   ----------  LVOT mean velocity, S          112  cm/s   ----------  LVOT VTI, S               32.2 cm    ----------  LVOT peak gradient, S          11  mm Hg  ----------    Aortic valve               Value     Reference  Aortic valve peak velocity, S      223  cm/s   ----------  Aortic valve mean velocity, S      142  cm/s   ----------  Aortic valve VTI, S           40.2 cm    ----------  Aortic mean gradient, S         9   mm Hg  ----------  Aortic peak gradient, S         20  mm Hg  ----------  VTI ratio, LVOT/AV            0.8      ----------  Aortic valve area, VTI          2.27 cm^2   ----------  Aortic valve area/bsa, VTI        1.09 cm^2/m^2 ----------  Velocity ratio, peak, LVOT/AV      0.74      ----------  Aortic valve area, peak velocity     2.1  cm^2   ----------  Aortic valve area/bsa, peak       1.01 cm^2/m^2 ----------  velocity  Velocity ratio, mean, LVOT/AV      0.79      ----------  Aortic valve area, mean velocity     2.24 cm^2   ----------  Aortic valve area/bsa, mean       1.08 cm^2/m^2  ----------  velocity    Aorta                  Value  Reference  Aortic root ID, ED            26  mm    ----------    Left atrium               Value     Reference  LA ID, A-P, ES              35  mm    ----------  LA ID/bsa, A-P              1.69 cm/m^2  <=2.2  LA volume, S               58.8 ml    ----------  LA volume/bsa, S             28.3 ml/m^2  ----------  LA volume, ES, 1-p A4C          58.2 ml    ----------  LA volume/bsa, ES, 1-p A4C        28  ml/m^2  ----------  LA volume, ES, 1-p A2C          59.2 ml    ----------  LA volume/bsa, ES, 1-p A2C        28.5 ml/m^2  ----------    Mitral valve               Value     Reference  Mitral E-wave peak velocity       97.9 cm/s   ----------  Mitral A-wave peak velocity       127  cm/s   ----------  Mitral deceleration time     (H)   306  ms    150 - 230  Mitral pressure half-time        90  ms    ----------  Mitral peak gradient, D         4   mm Hg  ----------  Mitral E/A ratio, peak          0.8      ----------  Mitral valve area, PHT, DP        2.44 cm^2   ----------  Mitral valve area/bsa, PHT, DP      1.17 cm^2/m^2 ----------    Right atrium               Value     Reference  RA ID, S-I, ES, A4C       (H)   54.3 mm    34 - 49  RA area, ES, A4C             18.2 cm^2   8.3 - 19.5  RA volume, ES, A/L            47.6 ml    ----------  RA volume/bsa, ES, A/L          22.9 ml/m^2  ----------    Systemic veins              Value     Reference  Estimated CVP              3   mm Hg  ----------    Right ventricle              Value     Reference  TAPSE                  30.6 mm    ----------  RV s&', lateral, S  19.8 cm/s   ----------   Legend:  (L) and (H) mark values outside specified reference range.   -------------------------------------------------------------------  Prepared and Electronically Authenticated by   Tobias Alexander, M.D.  2020-01-18T14:27:29  Physicians  Panel Physicians Referring Physician Case Authorizing Physician  Swaziland, Peter M, MD (Primary)      Procedures  LEFT HEART CATH AND CORONARY ANGIOGRAPHY   Conclusion    Mid LM to Dist LM lesion is 70% stenosed.  Ost LAD to Prox LAD lesion is 50% stenosed.  Prox LAD to Mid LAD lesion is 70% stenosed.  1st Mrg lesion is 50% stenosed.  Dist Cx lesion is 30% stenosed with 30% stenosed side branch in LPAV.  Ost RCA to Prox RCA lesion is 90% stenosed.  The left ventricular systolic function is normal.  LV end diastolic pressure is normal.  The left ventricular ejection fraction is 55-65% by visual estimate.  1. 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 Normal LV function 3. Normal LVEDP  Plan: patient has high risk anatomy that is not suitable for PCI. Recommend CT surgery evaluation for CABG.     Recommendations  Antiplatelet/Anticoag Recommend Aspirin 81mg  daily for moderate CAD.   Indications  Angina pectoris (HCC) [I20.9 (ICD-10-CM)]   Procedural Details  Technical Details Indication: 75 yo WF with history of HTN, HLD and family history of CAD. Evaluated for chest pain. Coronary CTA is high risk with calcium score >1000 and flow impairment noted in LAD and RCA territories.  Procedural Details: The right wrist was prepped, draped, and anesthetized with 1% lidocaine. Ultrasound was used to guide access. Image was obtained and stored in the record. Using the modified  Seldinger technique, a 6 French slender sheath was introduced into the right radial artery. 3 mg of verapamil was administered through the sheath, weight-based unfractionated heparin was administered intravenously. Standard Judkins catheters were used for selective coronary angiography and left ventriculography. Catheter exchanges were performed over an exchange length guidewire. There were no immediate procedural complications. A TR band was used for radial hemostasis at the completion of the procedure. The patient was transferred to the post catheterization recovery area for further monitoring.  Contrast: 65 cc  Estimated blood loss <50 mL.   During this procedure medications were administered to achieve and maintain moderate conscious sedation while the patient's heart rate, blood pressure, and oxygen saturation were continuously monitored and I was present face-to-face 100% of this time.   Medications (Filter: Administrations occurring from 0753 to 0839 on 07/27/20) (important) Continuous medications are totaled by the amount administered until 07/27/20 0839.    Heparin (Porcine) in NaCl 1000-0.9 UT/500ML-% SOLN (mL) Total volume:  1,000 mL  Date/Time Rate/Dose/Volume Action   07/27/20 0804 500 mL Given   0804 500 mL Given    midazolam (VERSED) injection (mg) Total dose:  2 mg  Date/Time Rate/Dose/Volume Action   07/27/20 0809 2 mg Given    fentaNYL (SUBLIMAZE) injection (mcg) Total dose:  25 mcg  Date/Time Rate/Dose/Volume Action   07/27/20 0810 25 mcg Given    heparin sodium (porcine) injection (Units) Total dose:  4,000 Units  Date/Time Rate/Dose/Volume Action   07/27/20 0817 4,000 Units Given    Radial Cocktail/Verapamil only (mL) Total volume:  10 mL  Date/Time Rate/Dose/Volume Action   07/27/20 0814 10 mL Given    lidocaine (PF) (XYLOCAINE) 1 % injection (mL) Total volume:  2 mL  Date/Time Rate/Dose/Volume Action   07/27/20 07/29/20  2  mL Given    iohexol (OMNIPAQUE) 350 MG/ML injection (mL) Total volume:  65 mL  Date/Time Rate/Dose/Volume Action   07/27/20 0838 65 mL Given    Sedation Time  Sedation Time Physician-1: 25 minutes 32 seconds   Contrast  Medication Name Total Dose  iohexol (OMNIPAQUE) 350 MG/ML injection 65 mL    Radiation/Fluoro  Fluoro time: 6 (min) DAP: 15515 (mGycm2) Cumulative Air Kerma: 257 (mGy)   Complications   Complications documented before study signed (07/27/2020 8:44 AM)    No complications were associated with this study.  Documented by Sw[place]il[place], Peter M, MD - 07/27/2020 8:42 AM     Coronary Findings   Diagnostic Dominance: Co-dominant  Left Main  Mid LM to Dist LM lesion is 70% stenosed. The lesion is severely calcified.  Left Anterior Descending  Ost LAD to Prox LAD lesion is 50% stenosed. The lesion is severely calcified.  Prox LAD to Mid LAD lesion is 70% stenosed. The lesion is severely calcified.  Left Circumflex  Dist Cx lesion is 30% stenosed with 30% stenosed side branch in LPAV.  First Obtuse Marginal Branch  1st Mrg lesion is 50% stenosed.  Right Coronary Artery  Ost RCA to Prox RCA lesion is 90% stenosed. The lesion is severely calcified.   Intervention   No interventions have been documented.  Wall Motion       Resting       All segments of the heart are normal.           Left Heart  Left Ventricle The left ventricular size is normal. The left ventricular systolic function is normal. LV end diastolic pressure is normal. The left ventricular ejection fraction is 55-65% by visual estimate. No regional wall motion abnormalities.   Coronary Diagrams   Diagnostic Dominance: Co-dominant    Intervention    Implants       No implant documentation for this case.   Syngo Images  Show images for CARDIAC CATHETERIZATION  Images on Long Term Storage  Show images for Debbie Cardenas, Debbie Cardenas to Procedure Log  Procedure Log     Hemo Data  Flowsheet Row Most Recent Value  AO Systolic Pressure 136 mmHg  AO Diastolic Pressure 54 mmHg  AO Mean 86 mmHg  LV Systolic Pressure 146 mmHg  LV Diastolic Pressure 0 mmHg  LV EDP 6 mmHg  AOp Systolic Pressure 132 mmHg  AOp Diastolic Pressure 55 mmHg  AOp Mean Pressure 86 mmHg  LVp Systolic Pressure 136 mmHg  LVp Diastolic Pressure 6 mmHg  LVp EDP Pressure 9 mmHg      Impression:  This 75 year old woman has high-grade left main and ostial RCA stenosis as well as significant proximal to mid LAD stenosis with heavily calcification in the areas of stenosis.  I agree that coronary artery bypass graft surgery is the best treatment event further ischemia and infarction. I discussed the operative procedure with the patient and her husband including alternatives, benefits and risks; including but not limited to bleeding, blood transfusion, infection, stroke, myocardial infarction, graft failure, heart block requiring a permanent pacemaker, organ dysfunction, and death.  Debbie Cardenas understands and agrees to proceed.     Plan:  Coronary bypass graft surgery.   Alleen Borne, MD [facility] [phone]

## 2020-08-14 NOTE — Anesthesia Preprocedure Evaluation (Addendum)
Anesthesia Evaluation  Patient identified by MRN, date of birth, ID band Patient awake    Reviewed: Allergy & Precautions, NPO status , Patient's Chart, lab work & pertinent test results, reviewed documented beta blocker date and time   Airway Mallampati: II  TM Distance: >3 FB Neck ROM: Full    Dental  (+) Dental Advisory Given, Upper Dentures, Partial Lower   Pulmonary former smoker,    Pulmonary exam normal breath sounds clear to auscultation       Cardiovascular hypertension, Pt. on home beta blockers and Pt. on medications + angina + CAD  Normal cardiovascular exam Rhythm:Regular Rate:Normal  Echo 08/16/2018: - Left ventricle: The cavity size was normal. There was mild  concentric hypertrophy. Systolic function was normal. The  estimated ejection fraction was in the range of 60% to 65%. Wall  motion was normal; there were no regional wall motion  abnormalities. Doppler parameters are consistent with abnormal  left ventricular relaxation (grade 1 diastolic dysfunction).  There was no evidence of elevated ventricular filling pressure by  Doppler parameters.  - Aortic valve: Valve area (VTI): 2.27 cm^2. Valve area (Vmax): 2.1  cm^2. Valve area (Vmean): 2.24 cm^2.  - Mitral valve: Valve area by pressure half-time: 2.44 cm^2.  - Right ventricle: Systolic function was normal.  - Right atrium: The atrium was normal in size.  - Tricuspid valve: There was no regurgitation.  - Pulmonary arteries: Systolic pressure was within the normal  range.  - Inferior vena cava: The vessel was normal in size.  - Pericardium, extracardiac: There was no pericardial effusion.   -------------------------------------------------------------------  Study data: No prior study was available for comparison. Study  status: Routine. Procedure: The patient reported no pain pre or  post test. Transthoracic echocardiography. Image  quality was  adequate. Study completion: There were no complications.  Transthoracic echocardiography. M-mode, complete 2D, spectral  Doppler, and color Doppler. Birthdate: Patient birthdate:  [birth year]. Age: Patient is 75 yr old. Sex: Gender: female.  BMI: 33.3 kg/m^2. Blood pressure:   148/70 Patient status:  Inpatient. Study date: Study date: 08/16/2018. Study time: 01:33  PM. Location: Bedside.    Neuro/Psych PSYCHIATRIC DISORDERS Anxiety negative neurological ROS     GI/Hepatic negative GI ROS, Neg liver ROS,   Endo/Other  negative endocrine ROS  Renal/GU negative Renal ROS     Musculoskeletal  (+) Arthritis ,   Abdominal   Peds  Hematology negative hematology ROS (+)   Anesthesia Other Findings   Reproductive/Obstetrics                            Anesthesia Physical Anesthesia Plan  ASA: IV  Anesthesia Plan: General   Post-op Pain Management:    Induction: Intravenous  PONV Risk Score and Plan: 3 and Midazolam and Treatment may vary due to age or medical condition  Airway Management Planned: Oral ETT  Additional Equipment: Arterial line, CVP, PA Cath, TEE and Ultrasound Guidance Line Placement  Intra-op Plan:   Post-operative Plan: Post-operative intubation/ventilation  Informed Consent: I have reviewed the patients History and Physical, chart, labs and discussed the procedure including the risks, benefits and alternatives for the proposed anesthesia with the patient or authorized representative who has indicated his/her understanding and acceptance.     Dental advisory given  Plan Discussed with: CRNA  Anesthesia Plan Comments:        Anesthesia Quick Evaluation

## 2020-08-15 ENCOUNTER — Encounter (HOSPITAL_COMMUNITY): Payer: Self-pay | Admitting: Surgery

## 2020-08-15 ENCOUNTER — Inpatient Hospital Stay (HOSPITAL_COMMUNITY): Payer: Medicare Other

## 2020-08-15 ENCOUNTER — Other Ambulatory Visit: Payer: Self-pay

## 2020-08-15 ENCOUNTER — Inpatient Hospital Stay (HOSPITAL_COMMUNITY)
Admission: RE | Admit: 2020-08-15 | Discharge: 2020-08-24 | DRG: 236 | Disposition: A | Discharge status: Home / Self Care | Payer: Medicare Other | Attending: Surgery | Admitting: Surgery

## 2020-08-15 ENCOUNTER — Inpatient Hospital Stay (HOSPITAL_COMMUNITY): Payer: Medicare Other | Admitting: Certified Registered Nurse Anesthetist

## 2020-08-15 ENCOUNTER — Inpatient Hospital Stay (HOSPITAL_COMMUNITY)
Admission: RE | Disposition: A | Discharge status: Home / Self Care | Payer: Self-pay | Source: Home / Self Care | Attending: Surgery

## 2020-08-15 ENCOUNTER — Inpatient Hospital Stay (HOSPITAL_COMMUNITY): Payer: Medicare Other | Admitting: Vascular Surgery

## 2020-08-15 DIAGNOSIS — I10 Essential (primary) hypertension: Secondary | ICD-10-CM | POA: Diagnosis present

## 2020-08-15 DIAGNOSIS — I480 Paroxysmal atrial fibrillation: Secondary | ICD-10-CM | POA: Diagnosis not present

## 2020-08-15 DIAGNOSIS — D62 Acute posthemorrhagic anemia: Secondary | ICD-10-CM | POA: Diagnosis not present

## 2020-08-15 DIAGNOSIS — Z96642 Presence of left artificial hip joint: Secondary | ICD-10-CM | POA: Diagnosis present

## 2020-08-15 DIAGNOSIS — J9811 Atelectasis: Secondary | ICD-10-CM | POA: Diagnosis not present

## 2020-08-15 DIAGNOSIS — I6522 Occlusion and stenosis of left carotid artery: Secondary | ICD-10-CM | POA: Diagnosis present

## 2020-08-15 DIAGNOSIS — Z8249 Family history of ischemic heart disease and other diseases of the circulatory system: Secondary | ICD-10-CM | POA: Diagnosis not present

## 2020-08-15 DIAGNOSIS — I25119 Atherosclerotic heart disease of native coronary artery with unspecified angina pectoris: Secondary | ICD-10-CM | POA: Diagnosis present

## 2020-08-15 DIAGNOSIS — Z87891 Personal history of nicotine dependence: Secondary | ICD-10-CM

## 2020-08-15 DIAGNOSIS — Z7982 Long term (current) use of aspirin: Secondary | ICD-10-CM | POA: Diagnosis not present

## 2020-08-15 DIAGNOSIS — Z9842 Cataract extraction status, left eye: Secondary | ICD-10-CM

## 2020-08-15 DIAGNOSIS — E785 Hyperlipidemia, unspecified: Secondary | ICD-10-CM | POA: Diagnosis present

## 2020-08-15 DIAGNOSIS — Z9841 Cataract extraction status, right eye: Secondary | ICD-10-CM

## 2020-08-15 DIAGNOSIS — E8770 Fluid overload, unspecified: Secondary | ICD-10-CM | POA: Diagnosis not present

## 2020-08-15 DIAGNOSIS — M170 Bilateral primary osteoarthritis of knee: Secondary | ICD-10-CM | POA: Diagnosis present

## 2020-08-15 DIAGNOSIS — I2584 Coronary atherosclerosis due to calcified coronary lesion: Secondary | ICD-10-CM | POA: Diagnosis present

## 2020-08-15 DIAGNOSIS — Z951 Presence of aortocoronary bypass graft: Secondary | ICD-10-CM

## 2020-08-15 DIAGNOSIS — I2510 Atherosclerotic heart disease of native coronary artery without angina pectoris: Secondary | ICD-10-CM

## 2020-08-15 DIAGNOSIS — Z888 Allergy status to other drugs, medicaments and biological substances status: Secondary | ICD-10-CM

## 2020-08-15 DIAGNOSIS — Z79899 Other long term (current) drug therapy: Secondary | ICD-10-CM | POA: Diagnosis not present

## 2020-08-15 DIAGNOSIS — J90 Pleural effusion, not elsewhere classified: Secondary | ICD-10-CM

## 2020-08-15 HISTORY — PX: CORONARY ARTERY BYPASS GRAFT: SHX141

## 2020-08-15 HISTORY — PX: TEE WITHOUT CARDIOVERSION: SHX5443

## 2020-08-15 LAB — POCT I-STAT 7, (LYTES, BLD GAS, ICA,H+H)
Acid-Base Excess: 2 mmol/L (ref 0.0–2.0)
Acid-Base Excess: 1 mmol/L (ref 0.0–2.0)
Acid-Base Excess: 1 mmol/L (ref 0.0–2.0)
Acid-base deficit: 2 mmol/L (ref 0.0–2.0)
Acid-base deficit: 3 mmol/L — ABNORMAL HIGH (ref 0.0–2.0)
Acid-base deficit: 3 mmol/L — ABNORMAL HIGH (ref 0.0–2.0)
Bicarbonate: 25.5 mmol/L (ref 20.0–28.0)
Bicarbonate: 25.4 mmol/L (ref 20.0–28.0)
Bicarbonate: 26.1 mmol/L (ref 20.0–28.0)
Bicarbonate: 23 mmol/L (ref 20.0–28.0)
Bicarbonate: 22.3 mmol/L (ref 20.0–28.0)
Bicarbonate: 22 mmol/L (ref 20.0–28.0)
Calcium, Ion: 1.02 mmol/L — ABNORMAL LOW (ref 1.15–1.40)
Calcium, Ion: 1.12 mmol/L — ABNORMAL LOW (ref 1.15–1.40)
Calcium, Ion: 1.14 mmol/L — ABNORMAL LOW (ref 1.15–1.40)
Calcium, Ion: 1.16 mmol/L (ref 1.15–1.40)
Calcium, Ion: 1.23 mmol/L (ref 1.15–1.40)
Calcium, Ion: 1.27 mmol/L (ref 1.15–1.40)
HCT: 23 % — ABNORMAL LOW (ref 36.0–46.0)
HCT: 23 % — ABNORMAL LOW (ref 36.0–46.0)
HCT: 24 % — ABNORMAL LOW (ref 36.0–46.0)
HCT: 27 % — ABNORMAL LOW (ref 36.0–46.0)
HCT: 27 % — ABNORMAL LOW (ref 36.0–46.0)
HCT: 26 % — ABNORMAL LOW (ref 36.0–46.0)
Hemoglobin: 7.8 g/dL — ABNORMAL LOW (ref 12.0–15.0)
Hemoglobin: 7.8 g/dL — ABNORMAL LOW (ref 12.0–15.0)
Hemoglobin: 8.2 g/dL — ABNORMAL LOW (ref 12.0–15.0)
Hemoglobin: 9.2 g/dL — ABNORMAL LOW (ref 12.0–15.0)
Hemoglobin: 9.2 g/dL — ABNORMAL LOW (ref 12.0–15.0)
Hemoglobin: 8.8 g/dL — ABNORMAL LOW (ref 12.0–15.0)
O2 Saturation: 100 %
O2 Saturation: 100 %
O2 Saturation: 100 %
O2 Saturation: 97 %
O2 Saturation: 98 %
O2 Saturation: 97 %
Patient temperature: 35.9
Patient temperature: 99.8
Patient temperature: 99.7
Potassium: 3.6 mmol/L (ref 3.5–5.1)
Potassium: 3.7 mmol/L (ref 3.5–5.1)
Potassium: 3.7 mmol/L (ref 3.5–5.1)
Potassium: 3.4 mmol/L — ABNORMAL LOW (ref 3.5–5.1)
Potassium: 4.1 mmol/L (ref 3.5–5.1)
Potassium: 4 mmol/L (ref 3.5–5.1)
Sodium: 141 mmol/L (ref 135–145)
Sodium: 139 mmol/L (ref 135–145)
Sodium: 140 mmol/L (ref 135–145)
Sodium: 142 mmol/L (ref 135–145)
Sodium: 142 mmol/L (ref 135–145)
Sodium: 142 mmol/L (ref 135–145)
TCO2: 26 mmol/L (ref 22–32)
TCO2: 26 mmol/L (ref 22–32)
TCO2: 27 mmol/L (ref 22–32)
TCO2: 24 mmol/L (ref 22–32)
TCO2: 23 mmol/L (ref 22–32)
TCO2: 23 mmol/L (ref 22–32)
pCO2 arterial: 31.1 mmHg — ABNORMAL LOW (ref 32.0–48.0)
pCO2 arterial: 35.9 mmHg (ref 32.0–48.0)
pCO2 arterial: 40.7 mmHg (ref 32.0–48.0)
pCO2 arterial: 36.3 mmHg (ref 32.0–48.0)
pCO2 arterial: 40.3 mmHg (ref 32.0–48.0)
pCO2 arterial: 40.5 mmHg (ref 32.0–48.0)
pH, Arterial: 7.52 — ABNORMAL HIGH (ref 7.350–7.450)
pH, Arterial: 7.414 (ref 7.350–7.450)
pH, Arterial: 7.457 — ABNORMAL HIGH (ref 7.350–7.450)
pH, Arterial: 7.405 (ref 7.350–7.450)
pH, Arterial: 7.354 (ref 7.350–7.450)
pH, Arterial: 7.346 — ABNORMAL LOW (ref 7.350–7.450)
pO2, Arterial: 358 mmHg — ABNORMAL HIGH (ref 83.0–108.0)
pO2, Arterial: 342 mmHg — ABNORMAL HIGH (ref 83.0–108.0)
pO2, Arterial: 412 mmHg — ABNORMAL HIGH (ref 83.0–108.0)
pO2, Arterial: 87 mmHg (ref 83.0–108.0)
pO2, Arterial: 122 mmHg — ABNORMAL HIGH (ref 83.0–108.0)
pO2, Arterial: 104 mmHg (ref 83.0–108.0)

## 2020-08-15 LAB — POCT I-STAT EG7
Acid-Base Excess: 0 mmol/L (ref 0.0–2.0)
Bicarbonate: 23.8 mmol/L (ref 20.0–28.0)
Calcium, Ion: 1.08 mmol/L — ABNORMAL LOW (ref 1.15–1.40)
HCT: 24 % — ABNORMAL LOW (ref 36.0–46.0)
Hemoglobin: 8.2 g/dL — ABNORMAL LOW (ref 12.0–15.0)
O2 Saturation: 79 %
Potassium: 3.5 mmol/L (ref 3.5–5.1)
Sodium: 141 mmol/L (ref 135–145)
TCO2: 25 mmol/L (ref 22–32)
pCO2, Ven: 32.2 mmHg — ABNORMAL LOW (ref 44.0–60.0)
pH, Ven: 7.477 — ABNORMAL HIGH (ref 7.250–7.430)
pO2, Ven: 40 mmHg (ref 32.0–45.0)

## 2020-08-15 LAB — BASIC METABOLIC PANEL
Anion gap: 6 (ref 5–15)
BUN: 13 mg/dL (ref 8–23)
CO2: 22 mmol/L (ref 22–32)
Calcium: 8.3 mg/dL — ABNORMAL LOW (ref 8.9–10.3)
Chloride: 111 mmol/L (ref 98–111)
Creatinine, Ser: 0.79 mg/dL (ref 0.44–1.00)
GFR, Estimated: >60 mL/min (ref >60)
Glucose, Bld: 110 mg/dL — ABNORMAL HIGH (ref 70–99)
Potassium: 4.1 mmol/L (ref 3.5–5.1)
Sodium: 139 mmol/L (ref 135–145)

## 2020-08-15 LAB — CBC
HCT: 28.1 % — ABNORMAL LOW (ref 36.0–46.0)
HCT: 31.3 % — ABNORMAL LOW (ref 36.0–46.0)
Hemoglobin: 9.9 g/dL — ABNORMAL LOW (ref 12.0–15.0)
Hemoglobin: 10.4 g/dL — ABNORMAL LOW (ref 12.0–15.0)
MCH: 31.5 pg (ref 26.0–34.0)
MCH: 30.2 pg (ref 26.0–34.0)
MCHC: 35.2 g/dL (ref 30.0–36.0)
MCHC: 33.2 g/dL (ref 30.0–36.0)
MCV: 89.5 fL (ref 80.0–100.0)
MCV: 91 fL (ref 80.0–100.0)
Platelets: 159 10*3/uL (ref 150–400)
Platelets: 182 10*3/uL (ref 150–400)
RBC: 3.14 MIL/uL — ABNORMAL LOW (ref 3.87–5.11)
RBC: 3.44 MIL/uL — ABNORMAL LOW (ref 3.87–5.11)
RDW: 12.6 % (ref 11.5–15.5)
RDW: 12.9 % (ref 11.5–15.5)
WBC: 8.8 10*3/uL (ref 4.0–10.5)
WBC: 10.6 10*3/uL — ABNORMAL HIGH (ref 4.0–10.5)
nRBC: 0 % (ref 0.0–0.2)
nRBC: 0 % (ref 0.0–0.2)

## 2020-08-15 LAB — POCT I-STAT, CHEM 8
BUN: 17 mg/dL (ref 8–23)
BUN: 17 mg/dL (ref 8–23)
BUN: 16 mg/dL (ref 8–23)
BUN: 15 mg/dL (ref 8–23)
Calcium, Ion: 1.3 mmol/L (ref 1.15–1.40)
Calcium, Ion: 1.28 mmol/L (ref 1.15–1.40)
Calcium, Ion: 1.15 mmol/L (ref 1.15–1.40)
Calcium, Ion: 1.14 mmol/L — ABNORMAL LOW (ref 1.15–1.40)
Chloride: 105 mmol/L (ref 98–111)
Chloride: 105 mmol/L (ref 98–111)
Chloride: 104 mmol/L (ref 98–111)
Chloride: 104 mmol/L (ref 98–111)
Creatinine, Ser: 0.8 mg/dL (ref 0.44–1.00)
Creatinine, Ser: 0.7 mg/dL (ref 0.44–1.00)
Creatinine, Ser: 0.6 mg/dL (ref 0.44–1.00)
Creatinine, Ser: 0.6 mg/dL (ref 0.44–1.00)
Glucose, Bld: 103 mg/dL — ABNORMAL HIGH (ref 70–99)
Glucose, Bld: 134 mg/dL — ABNORMAL HIGH (ref 70–99)
Glucose, Bld: 145 mg/dL — ABNORMAL HIGH (ref 70–99)
Glucose, Bld: 136 mg/dL — ABNORMAL HIGH (ref 70–99)
HCT: 34 % — ABNORMAL LOW (ref 36.0–46.0)
HCT: 31 % — ABNORMAL LOW (ref 36.0–46.0)
HCT: 25 % — ABNORMAL LOW (ref 36.0–46.0)
HCT: 25 % — ABNORMAL LOW (ref 36.0–46.0)
Hemoglobin: 11.6 g/dL — ABNORMAL LOW (ref 12.0–15.0)
Hemoglobin: 10.5 g/dL — ABNORMAL LOW (ref 12.0–15.0)
Hemoglobin: 8.5 g/dL — ABNORMAL LOW (ref 12.0–15.0)
Hemoglobin: 8.5 g/dL — ABNORMAL LOW (ref 12.0–15.0)
Potassium: 3.7 mmol/L (ref 3.5–5.1)
Potassium: 3.5 mmol/L (ref 3.5–5.1)
Potassium: 3.4 mmol/L — ABNORMAL LOW (ref 3.5–5.1)
Potassium: 3.7 mmol/L (ref 3.5–5.1)
Sodium: 141 mmol/L (ref 135–145)
Sodium: 140 mmol/L (ref 135–145)
Sodium: 141 mmol/L (ref 135–145)
Sodium: 139 mmol/L (ref 135–145)
TCO2: 30 mmol/L (ref 22–32)
TCO2: 25 mmol/L (ref 22–32)
TCO2: 28 mmol/L (ref 22–32)
TCO2: 27 mmol/L (ref 22–32)

## 2020-08-15 LAB — PROTIME-INR
INR: 1.4 — ABNORMAL HIGH (ref 0.8–1.2)
Prothrombin Time: 16.5 seconds — ABNORMAL HIGH (ref 11.4–15.2)

## 2020-08-15 LAB — GLUCOSE, CAPILLARY
Glucose-Capillary: 114 mg/dL — ABNORMAL HIGH (ref 70–99)
Glucose-Capillary: 130 mg/dL — ABNORMAL HIGH (ref 70–99)
Glucose-Capillary: 115 mg/dL — ABNORMAL HIGH (ref 70–99)
Glucose-Capillary: 95 mg/dL (ref 70–99)
Glucose-Capillary: 123 mg/dL — ABNORMAL HIGH (ref 70–99)
Glucose-Capillary: 106 mg/dL — ABNORMAL HIGH (ref 70–99)
Glucose-Capillary: 107 mg/dL — ABNORMAL HIGH (ref 70–99)
Glucose-Capillary: 128 mg/dL — ABNORMAL HIGH (ref 70–99)
Glucose-Capillary: 127 mg/dL — ABNORMAL HIGH (ref 70–99)

## 2020-08-15 LAB — PLATELET COUNT: Platelets: 225 10*3/uL (ref 150–400)

## 2020-08-15 LAB — ECHO INTRAOPERATIVE TEE
Height: 65 in
Weight: 2560 oz

## 2020-08-15 LAB — HEMOGLOBIN AND HEMATOCRIT, BLOOD
HCT: 25.6 % — ABNORMAL LOW (ref 36.0–46.0)
Hemoglobin: 8.7 g/dL — ABNORMAL LOW (ref 12.0–15.0)

## 2020-08-15 LAB — APTT: aPTT: 31 seconds (ref 24–36)

## 2020-08-15 LAB — MAGNESIUM: Magnesium: 3.3 mg/dL — ABNORMAL HIGH (ref 1.7–2.4)

## 2020-08-15 SURGERY — CORONARY ARTERY BYPASS GRAFTING (CABG)
Anesthesia: General | Site: Chest

## 2020-08-15 MED ORDER — ROCURONIUM BROMIDE 10 MG/ML (PF) SYRINGE
PREFILLED_SYRINGE | INTRAVENOUS | Status: AC
  Filled 2020-08-15: qty 20

## 2020-08-15 MED ORDER — LIDOCAINE HCL (CARDIAC) PF 100 MG/5ML IV SOSY: PREFILLED_SYRINGE | INTRAVENOUS | Status: DC | PRN

## 2020-08-15 MED ORDER — LACTATED RINGERS IV SOLN: INTRAVENOUS | Status: DC | PRN

## 2020-08-15 MED ORDER — CHLORHEXIDINE GLUCONATE 0.12% ORAL RINSE (MEDLINE KIT)
15.0000 mL | Freq: Two times a day (BID) | OROMUCOSAL | Status: DC
  Administered 2020-08-15: 15 mL via OROMUCOSAL

## 2020-08-15 MED ORDER — ROSUVASTATIN CALCIUM 20 MG PO TABS
20.0000 mg | ORAL_TABLET | Freq: Every evening | ORAL | Status: DC
  Administered 2020-08-16 – 2020-08-23 (×8): 20 mg via ORAL
  Filled 2020-08-15 (×8): qty 1

## 2020-08-15 MED ORDER — PHENYLEPHRINE HCL (PRESSORS) 10 MG/ML IV SOLN
INTRAVENOUS | Status: DC | PRN
  Administered 2020-08-15: 40 ug via INTRAVENOUS
  Administered 2020-08-15: 120 ug via INTRAVENOUS
  Administered 2020-08-15: 40 ug via INTRAVENOUS

## 2020-08-15 MED ORDER — MIDAZOLAM HCL (PF) 10 MG/2ML IJ SOLN
INTRAMUSCULAR | Status: AC
  Filled 2020-08-15: qty 2

## 2020-08-15 MED ORDER — SODIUM CHLORIDE 0.9 % IV SOLN: INTRAVENOUS | Status: DC | PRN

## 2020-08-15 MED ORDER — NITROGLYCERIN 0.2 MG/ML ON CALL CATH LAB
INTRAVENOUS | Status: DC | PRN
  Administered 2020-08-15 (×6): 20 ug via INTRAVENOUS

## 2020-08-15 MED ORDER — MIDAZOLAM HCL 5 MG/5ML IJ SOLN
INTRAMUSCULAR | Status: DC | PRN
  Administered 2020-08-15 (×2): 2 mg via INTRAVENOUS
  Administered 2020-08-15: 1 mg via INTRAVENOUS
  Administered 2020-08-15: 2 mg via INTRAVENOUS

## 2020-08-15 MED ORDER — ACETAMINOPHEN 650 MG RE SUPP
650.0000 mg | Freq: Once | RECTAL | Status: AC
  Administered 2020-08-15: 650 mg via RECTAL

## 2020-08-15 MED ORDER — ALBUMIN HUMAN 5 % IV SOLN: INTRAVENOUS | Status: DC | PRN

## 2020-08-15 MED ORDER — BISACODYL 5 MG PO TBEC
10.0000 mg | DELAYED_RELEASE_TABLET | Freq: Every day | ORAL | Status: DC
  Administered 2020-08-18: 10 mg via ORAL
  Filled 2020-08-15 (×2): qty 2

## 2020-08-15 MED ORDER — MORPHINE SULFATE (PF) 2 MG/ML IV SOLN
1.0000 mg | INTRAVENOUS | Status: DC | PRN
  Administered 2020-08-15 (×2): 1 mg via INTRAVENOUS
  Administered 2020-08-16: 2 mg via INTRAVENOUS
  Filled 2020-08-15 (×2): qty 1

## 2020-08-15 MED ORDER — ORAL CARE MOUTH RINSE
15.0000 mL | Freq: Two times a day (BID) | OROMUCOSAL | Status: DC
  Administered 2020-08-15 – 2020-08-24 (×12): 15 mL via OROMUCOSAL

## 2020-08-15 MED ORDER — SODIUM CHLORIDE 0.9% FLUSH
3.0000 mL | Freq: Two times a day (BID) | INTRAVENOUS | Status: DC
  Administered 2020-08-16 – 2020-08-18 (×3): 3 mL via INTRAVENOUS

## 2020-08-15 MED ORDER — PROTAMINE SULFATE 10 MG/ML IV SOLN
INTRAVENOUS | Status: AC
  Filled 2020-08-15: qty 25

## 2020-08-15 MED ORDER — TRAMADOL HCL 50 MG PO TABS
50.0000 mg | ORAL_TABLET | ORAL | Status: DC | PRN
Start: 2020-08-15 — End: 2020-08-18
  Administered 2020-08-15 – 2020-08-18 (×6): 50 mg via ORAL
  Filled 2020-08-15 (×7): qty 1

## 2020-08-15 MED ORDER — ALBUMIN HUMAN 5 % IV SOLN
250.0000 mL | INTRAVENOUS | Status: AC | PRN
  Administered 2020-08-15 (×2): 12.5 g via INTRAVENOUS

## 2020-08-15 MED ORDER — METOPROLOL TARTRATE 12.5 MG HALF TABLET
12.5000 mg | ORAL_TABLET | Freq: Two times a day (BID) | ORAL | Status: DC
  Administered 2020-08-15: 12.5 mg via ORAL
  Filled 2020-08-15: qty 1

## 2020-08-15 MED ORDER — FAMOTIDINE IN NACL 20-0.9 MG/50ML-% IV SOLN
20.0000 mg | Freq: Two times a day (BID) | INTRAVENOUS | Status: AC
  Administered 2020-08-15: 20 mg via INTRAVENOUS
  Filled 2020-08-15: qty 50

## 2020-08-15 MED ORDER — PHENYLEPHRINE HCL-NACL 20-0.9 MG/250ML-% IV SOLN
0.0000 ug/min | INTRAVENOUS | Status: DC
  Administered 2020-08-16: 10 ug/min via INTRAVENOUS
  Filled 2020-08-15: qty 250

## 2020-08-15 MED ORDER — DEXTROSE 50 % IV SOLN: 0.0000 mL | INTRAVENOUS | Status: DC | PRN

## 2020-08-15 MED ORDER — THROMBIN 20000 UNITS EX SOLR
CUTANEOUS | Status: DC | PRN
  Administered 2020-08-15: 20000 [IU] via TOPICAL

## 2020-08-15 MED ORDER — METOPROLOL TARTRATE 25 MG/10 ML ORAL SUSPENSION: 12.5000 mg | Freq: Two times a day (BID) | ORAL | Status: DC

## 2020-08-15 MED ORDER — PROPOFOL 10 MG/ML IV BOLUS
INTRAVENOUS | Status: AC
  Filled 2020-08-15: qty 20

## 2020-08-15 MED ORDER — VANCOMYCIN HCL IN DEXTROSE 1-5 GM/200ML-% IV SOLN
1000.0000 mg | Freq: Once | INTRAVENOUS | Status: AC
  Administered 2020-08-15: 1000 mg via INTRAVENOUS
  Filled 2020-08-15: qty 200

## 2020-08-15 MED ORDER — FENTANYL CITRATE (PF) 250 MCG/5ML IJ SOLN
INTRAMUSCULAR | Status: AC
  Filled 2020-08-15: qty 5

## 2020-08-15 MED ORDER — ORAL CARE MOUTH RINSE
15.0000 mL | OROMUCOSAL | Status: DC
  Administered 2020-08-15 (×2): 15 mL via OROMUCOSAL

## 2020-08-15 MED ORDER — PROPOFOL 10 MG/ML IV BOLUS
INTRAVENOUS | Status: DC | PRN
  Administered 2020-08-15 (×2): 30 mg via INTRAVENOUS
  Administered 2020-08-15: 50 mg via INTRAVENOUS
  Administered 2020-08-15 (×2): 30 mg via INTRAVENOUS

## 2020-08-15 MED ORDER — MIDAZOLAM HCL 2 MG/2ML IJ SOLN: 2.0000 mg | INTRAMUSCULAR | Status: DC | PRN

## 2020-08-15 MED ORDER — CHLORHEXIDINE GLUCONATE 0.12 % MT SOLN
15.0000 mL | OROMUCOSAL | Status: AC
  Administered 2020-08-15: 15 mL via OROMUCOSAL

## 2020-08-15 MED ORDER — LACTATED RINGERS IV SOLN: 500.0000 mL | Freq: Once | INTRAVENOUS | Status: DC | PRN

## 2020-08-15 MED ORDER — POTASSIUM CHLORIDE 10 MEQ/50ML IV SOLN
10.0000 meq | INTRAVENOUS | Status: AC
  Administered 2020-08-15 (×3): 10 meq via INTRAVENOUS

## 2020-08-15 MED ORDER — CHLORHEXIDINE GLUCONATE 4 % EX LIQD: 30.0000 mL | CUTANEOUS | Status: DC

## 2020-08-15 MED ORDER — CHLORHEXIDINE GLUCONATE 0.12 % MT SOLN
15.0000 mL | Freq: Once | OROMUCOSAL | Status: AC
  Administered 2020-08-15: 15 mL via OROMUCOSAL
  Filled 2020-08-15: qty 15

## 2020-08-15 MED ORDER — ACETAMINOPHEN 160 MG/5ML PO SOLN: 650.0000 mg | Freq: Once | ORAL | Status: AC

## 2020-08-15 MED ORDER — PANTOPRAZOLE SODIUM 40 MG PO TBEC
40.0000 mg | DELAYED_RELEASE_TABLET | Freq: Every day | ORAL | Status: DC
  Administered 2020-08-17 – 2020-08-18 (×2): 40 mg via ORAL
  Filled 2020-08-15 (×2): qty 1

## 2020-08-15 MED ORDER — ROCURONIUM BROMIDE 100 MG/10ML IV SOLN: INTRAVENOUS | Status: DC | PRN

## 2020-08-15 MED ORDER — CHLORHEXIDINE GLUCONATE CLOTH 2 % EX PADS
6.0000 | MEDICATED_PAD | Freq: Every day | CUTANEOUS | Status: DC
  Administered 2020-08-16 – 2020-08-18 (×3): 6 via TOPICAL

## 2020-08-15 MED ORDER — SODIUM CHLORIDE 0.9 % IV SOLN: INTRAVENOUS | Status: DC

## 2020-08-15 MED ORDER — ACETAMINOPHEN 160 MG/5ML PO SOLN: 1000.0000 mg | Freq: Four times a day (QID) | ORAL | Status: DC

## 2020-08-15 MED ORDER — METOPROLOL TARTRATE 12.5 MG HALF TABLET
12.5000 mg | ORAL_TABLET | Freq: Once | ORAL | Status: DC
  Filled 2020-08-15: qty 1

## 2020-08-15 MED ORDER — DEXMEDETOMIDINE HCL IN NACL 400 MCG/100ML IV SOLN: 0.0000 ug/kg/h | INTRAVENOUS | Status: DC

## 2020-08-15 MED ORDER — LACTATED RINGERS IV SOLN: INTRAVENOUS | Status: DC

## 2020-08-15 MED ORDER — SODIUM CHLORIDE 0.45 % IV SOLN: INTRAVENOUS | Status: DC | PRN

## 2020-08-15 MED ORDER — SODIUM CHLORIDE 0.9 % IV SOLN: 250.0000 mL | INTRAVENOUS | Status: DC

## 2020-08-15 MED ORDER — LIDOCAINE 2% (20 MG/ML) 5 ML SYRINGE
INTRAMUSCULAR | Status: AC
  Filled 2020-08-15: qty 5

## 2020-08-15 MED ORDER — HEPARIN SODIUM (PORCINE) 1000 UNIT/ML IJ SOLN
INTRAMUSCULAR | Status: DC | PRN
  Administered 2020-08-15: 25000 [IU] via INTRAVENOUS

## 2020-08-15 MED ORDER — OXYCODONE HCL 5 MG PO TABS
5.0000 mg | ORAL_TABLET | ORAL | Status: DC | PRN
  Administered 2020-08-16: 5 mg via ORAL
  Filled 2020-08-15: qty 1

## 2020-08-15 MED ORDER — INSULIN REGULAR(HUMAN) IN NACL 100-0.9 UT/100ML-% IV SOLN: INTRAVENOUS | Status: DC

## 2020-08-15 MED ORDER — MAGNESIUM SULFATE 4 GM/100ML IV SOLN
4.0000 g | Freq: Once | INTRAVENOUS | Status: AC
  Administered 2020-08-15: 4 g via INTRAVENOUS
  Filled 2020-08-15: qty 100

## 2020-08-15 MED ORDER — NITROGLYCERIN IN D5W 200-5 MCG/ML-% IV SOLN: 0.0000 ug/min | INTRAVENOUS | Status: DC

## 2020-08-15 MED ORDER — THROMBIN 20000 UNITS EX SOLR
OROMUCOSAL | Status: DC | PRN
  Administered 2020-08-15 (×3): 4 mL via TOPICAL

## 2020-08-15 MED ORDER — FENTANYL CITRATE (PF) 250 MCG/5ML IJ SOLN
INTRAMUSCULAR | Status: AC
  Filled 2020-08-15: qty 25

## 2020-08-15 MED ORDER — SODIUM CHLORIDE 0.9% FLUSH: 3.0000 mL | INTRAVENOUS | Status: DC | PRN

## 2020-08-15 MED ORDER — LIDOCAINE 2% (20 MG/ML) 5 ML SYRINGE
INTRAMUSCULAR | Status: DC | PRN
  Administered 2020-08-15: 80 mg via INTRAVENOUS

## 2020-08-15 MED ORDER — METOPROLOL TARTRATE 5 MG/5ML IV SOLN: 2.5000 mg | INTRAVENOUS | Status: DC | PRN

## 2020-08-15 MED ORDER — DOCUSATE SODIUM 100 MG PO CAPS
200.0000 mg | ORAL_CAPSULE | Freq: Every day | ORAL | Status: DC
  Administered 2020-08-16 – 2020-08-18 (×2): 200 mg via ORAL
  Filled 2020-08-15 (×3): qty 2

## 2020-08-15 MED ORDER — ACETAMINOPHEN 500 MG PO TABS
1000.0000 mg | ORAL_TABLET | Freq: Four times a day (QID) | ORAL | Status: DC
  Administered 2020-08-15 – 2020-08-18 (×10): 1000 mg via ORAL
  Filled 2020-08-15 (×11): qty 2

## 2020-08-15 MED ORDER — THROMBIN (RECOMBINANT) 20000 UNITS EX SOLR
CUTANEOUS | Status: AC
  Filled 2020-08-15: qty 20000

## 2020-08-15 MED ORDER — FENTANYL CITRATE (PF) 250 MCG/5ML IJ SOLN
INTRAMUSCULAR | Status: DC | PRN
  Administered 2020-08-15 (×3): 50 ug via INTRAVENOUS
  Administered 2020-08-15: 150 ug via INTRAVENOUS
  Administered 2020-08-15: 100 ug via INTRAVENOUS
  Administered 2020-08-15: 50 ug via INTRAVENOUS
  Administered 2020-08-15 (×5): 100 ug via INTRAVENOUS
  Administered 2020-08-15: 50 ug via INTRAVENOUS
  Administered 2020-08-15: 100 ug via INTRAVENOUS
  Administered 2020-08-15: 200 ug via INTRAVENOUS
  Administered 2020-08-15 (×2): 100 ug via INTRAVENOUS

## 2020-08-15 MED ORDER — ASPIRIN EC 325 MG PO TBEC
325.0000 mg | DELAYED_RELEASE_TABLET | Freq: Every day | ORAL | Status: DC
  Administered 2020-08-16 – 2020-08-18 (×3): 325 mg via ORAL
  Filled 2020-08-15 (×3): qty 1

## 2020-08-15 MED ORDER — PROTAMINE SULFATE 10 MG/ML IV SOLN
INTRAVENOUS | Status: DC | PRN
  Administered 2020-08-15: 250 mg via INTRAVENOUS

## 2020-08-15 MED ORDER — ROCURONIUM BROMIDE 10 MG/ML (PF) SYRINGE
PREFILLED_SYRINGE | INTRAVENOUS | Status: DC | PRN
  Administered 2020-08-15 (×2): 100 mg via INTRAVENOUS

## 2020-08-15 MED ORDER — PHENYLEPHRINE 40 MCG/ML (10ML) SYRINGE FOR IV PUSH (FOR BLOOD PRESSURE SUPPORT)
PREFILLED_SYRINGE | INTRAVENOUS | Status: AC
  Filled 2020-08-15: qty 10

## 2020-08-15 MED ORDER — SODIUM CHLORIDE 0.9% FLUSH
10.0000 mL | Freq: Two times a day (BID) | INTRAVENOUS | Status: DC
  Administered 2020-08-15 – 2020-08-18 (×5): 10 mL

## 2020-08-15 MED ORDER — SODIUM CHLORIDE 0.9% FLUSH: 10.0000 mL | INTRAVENOUS | Status: DC | PRN

## 2020-08-15 MED ORDER — BISACODYL 10 MG RE SUPP: 10.0000 mg | Freq: Every day | RECTAL | Status: DC

## 2020-08-15 MED ORDER — ONDANSETRON HCL 4 MG/2ML IJ SOLN
4.0000 mg | Freq: Four times a day (QID) | INTRAMUSCULAR | Status: DC | PRN
  Administered 2020-08-17: 4 mg via INTRAVENOUS
  Filled 2020-08-15: qty 2

## 2020-08-15 MED ORDER — SODIUM CHLORIDE 0.9 % IV SOLN
1.5000 g | Freq: Two times a day (BID) | INTRAVENOUS | Status: AC
  Administered 2020-08-15 – 2020-08-17 (×4): 1.5 g via INTRAVENOUS
  Filled 2020-08-15 (×4): qty 1.5

## 2020-08-15 MED ORDER — ASPIRIN 81 MG PO CHEW: 324.0000 mg | CHEWABLE_TABLET | Freq: Every day | ORAL | Status: DC

## 2020-08-15 SURGICAL SUPPLY — 107 items
BAG DECANTER FOR FLEXI CONT (MISCELLANEOUS) ×4 IMPLANT
BLADE 11 SAFETY STRL DISP (BLADE) ×4 IMPLANT
BLADE CLIPPER SURG (BLADE) ×4 IMPLANT
BLADE STERNUM SYSTEM 6 (BLADE) ×4 IMPLANT
BNDG ELASTIC 4X5.8 VLCR STR LF (GAUZE/BANDAGES/DRESSINGS) ×8 IMPLANT
BNDG ELASTIC 6X5.8 VLCR STR LF (GAUZE/BANDAGES/DRESSINGS) ×8 IMPLANT
BNDG GAUZE ELAST 4 BULKY (GAUZE/BANDAGES/DRESSINGS) ×8 IMPLANT
CANISTER SUCT 3000ML PPV (MISCELLANEOUS) ×4 IMPLANT
CATH ROBINSON RED A/P 18FR (CATHETERS) ×8 IMPLANT
CATH THORACIC 28FR (CATHETERS) ×8 IMPLANT
CATH THORACIC 36FR (CATHETERS) ×4 IMPLANT
CATH THORACIC 36FR RT ANG (CATHETERS) ×4 IMPLANT
CLIP VESOCCLUDE MED 24/CT (CLIP) IMPLANT
CLIP VESOCCLUDE SM WIDE 24/CT (CLIP) ×28 IMPLANT
DERMABOND ADVANCED (GAUZE/BANDAGES/DRESSINGS) ×4
DERMABOND ADVANCED .7 DNX12 (GAUZE/BANDAGES/DRESSINGS) ×4 IMPLANT
DRAPE CARDIOVASCULAR INCISE (DRAPES) ×2
DRAPE SLUSH/WARMER DISC (DRAPES) ×4 IMPLANT
DRAPE SRG 135X102X78XABS (DRAPES) ×2 IMPLANT
DRSG COVADERM 4X14 (GAUZE/BANDAGES/DRESSINGS) ×4 IMPLANT
ELECT CAUTERY BLADE 6.4 (BLADE) ×4 IMPLANT
ELECT REM PT RETURN 9FT ADLT (ELECTROSURGICAL) ×8
ELECTRODE REM PT RTRN 9FT ADLT (ELECTROSURGICAL) ×4 IMPLANT
FELT TEFLON 1X6 (MISCELLANEOUS) ×8 IMPLANT
GAUZE SPONGE 4X4 12PLY STRL (GAUZE/BANDAGES/DRESSINGS) ×8 IMPLANT
GAUZE SPONGE 4X4 12PLY STRL LF (GAUZE/BANDAGES/DRESSINGS) ×4 IMPLANT
GLOVE BIO SURGEON STRL SZ 6.5 (GLOVE) ×3 IMPLANT
GLOVE BIO SURGEON STRL SZ7 (GLOVE) IMPLANT
GLOVE BIO SURGEON STRL SZ7.5 (GLOVE) ×8 IMPLANT
GLOVE BIO SURGEONS STRL SZ 6.5 (GLOVE) ×1
GLOVE BIOGEL PI IND STRL 6 (GLOVE) IMPLANT
GLOVE BIOGEL PI IND STRL 7.0 (GLOVE) IMPLANT
GLOVE BIOGEL PI INDICATOR 6 (GLOVE)
GLOVE BIOGEL PI INDICATOR 7.0 (GLOVE)
GLOVE ORTHO TXT STRL SZ7.5 (GLOVE) IMPLANT
GLOVE SS BIOGEL STRL SZ 6.5 (GLOVE) ×2 IMPLANT
GLOVE SUPERSENSE BIOGEL SZ 6.5 (GLOVE) ×2
GLOVE SURG SIGNA 7.5 PF LTX (GLOVE) ×8 IMPLANT
GLOVE TRIUMPH SURG SIZE 7.0 (KITS) ×8 IMPLANT
GOWN STRL REUS W/ TWL LRG LVL3 (GOWN DISPOSABLE) ×8 IMPLANT
GOWN STRL REUS W/ TWL XL LVL3 (GOWN DISPOSABLE) ×2 IMPLANT
GOWN STRL REUS W/TWL LRG LVL3 (GOWN DISPOSABLE) ×8
GOWN STRL REUS W/TWL XL LVL3 (GOWN DISPOSABLE) ×2
HEMOSTAT POWDER SURGIFOAM 1G (HEMOSTASIS) ×12 IMPLANT
HEMOSTAT SURGICEL 2X14 (HEMOSTASIS) ×4 IMPLANT
INSERT FOGARTY 61MM (MISCELLANEOUS) IMPLANT
INSERT FOGARTY XLG (MISCELLANEOUS) IMPLANT
KIT BASIN OR (CUSTOM PROCEDURE TRAY) ×4 IMPLANT
KIT CATH CPB BARTLE (MISCELLANEOUS) ×4 IMPLANT
KIT SUCTION CATH 14FR (SUCTIONS) ×4 IMPLANT
KIT TURNOVER KIT B (KITS) ×4 IMPLANT
KIT VASOVIEW HEMOPRO 2 VH 4000 (KITS) ×4 IMPLANT
NS IRRIG 1000ML POUR BTL (IV SOLUTION) ×20 IMPLANT
PACK E OPEN HEART (SUTURE) ×4 IMPLANT
PACK OPEN HEART (CUSTOM PROCEDURE TRAY) ×4 IMPLANT
PAD ARMBOARD 7.5X6 YLW CONV (MISCELLANEOUS) ×8 IMPLANT
PAD ELECT DEFIB RADIOL ZOLL (MISCELLANEOUS) ×4 IMPLANT
PENCIL BUTTON HOLSTER BLD 10FT (ELECTRODE) ×4 IMPLANT
POSITIONER HEAD DONUT 9IN (MISCELLANEOUS) ×4 IMPLANT
PUNCH AORTIC ROTATE 4.0MM (MISCELLANEOUS) IMPLANT
PUNCH AORTIC ROTATE 4.5MM 8IN (MISCELLANEOUS) ×4 IMPLANT
PUNCH AORTIC ROTATE 5MM 8IN (MISCELLANEOUS) IMPLANT
SET CARDIOPLEGIA MPS 5001102 (MISCELLANEOUS) ×4 IMPLANT
SPONGE INTESTINAL PEANUT (DISPOSABLE) IMPLANT
SPONGE LAP 18X18 RF (DISPOSABLE) ×8 IMPLANT
SPONGE LAP 4X18 RFD (DISPOSABLE) ×8 IMPLANT
STAPLER VISISTAT 35W (STAPLE) ×4 IMPLANT
SUPPORT HEART JANKE-BARRON (MISCELLANEOUS) ×4 IMPLANT
SUT BONE WAX W31G (SUTURE) ×4 IMPLANT
SUT MNCRL AB 4-0 PS2 18 (SUTURE) ×8 IMPLANT
SUT PROLENE 3 0 SH DA (SUTURE) IMPLANT
SUT PROLENE 3 0 SH1 36 (SUTURE) ×4 IMPLANT
SUT PROLENE 4 0 RB 1 (SUTURE)
SUT PROLENE 4 0 SH DA (SUTURE) IMPLANT
SUT PROLENE 4-0 RB1 .5 CRCL 36 (SUTURE) IMPLANT
SUT PROLENE 5 0 C 1 36 (SUTURE) IMPLANT
SUT PROLENE 6 0 C 1 30 (SUTURE) ×8 IMPLANT
SUT PROLENE 7 0 BV 1 (SUTURE) IMPLANT
SUT PROLENE 7 0 BV1 MDA (SUTURE) ×4 IMPLANT
SUT PROLENE 8 0 BV175 6 (SUTURE) ×8 IMPLANT
SUT SILK  1 MH (SUTURE) ×2
SUT SILK 1 MH (SUTURE) ×2 IMPLANT
SUT SILK 2 0 SH (SUTURE) ×4 IMPLANT
SUT SILK 2 0SH CR/8 30 (SUTURE) ×4 IMPLANT
SUT STEEL 6MS V (SUTURE) ×8 IMPLANT
SUT STEEL STERNAL CCS#1 18IN (SUTURE) IMPLANT
SUT STEEL SZ 6 DBL 3X14 BALL (SUTURE) IMPLANT
SUT VIC AB 1 CTX 36 (SUTURE) ×4
SUT VIC AB 1 CTX36XBRD ANBCTR (SUTURE) ×4 IMPLANT
SUT VIC AB 2-0 CT1 27 (SUTURE) ×8
SUT VIC AB 2-0 CT1 TAPERPNT 27 (SUTURE) ×8 IMPLANT
SUT VIC AB 2-0 CTX 27 (SUTURE) IMPLANT
SUT VIC AB 3-0 SH 27 (SUTURE)
SUT VIC AB 3-0 SH 27X BRD (SUTURE) IMPLANT
SUT VIC AB 3-0 X1 27 (SUTURE) IMPLANT
SUT VICRYL 4-0 PS2 18IN ABS (SUTURE) IMPLANT
SYSTEM SAHARA CHEST DRAIN ATS (WOUND CARE) ×4 IMPLANT
TAPE CLOTH SURG 4X10 WHT LF (GAUZE/BANDAGES/DRESSINGS) ×12 IMPLANT
TAPE PAPER 2X10 WHT MICROPORE (GAUZE/BANDAGES/DRESSINGS) ×4 IMPLANT
TOWEL GREEN STERILE (TOWEL DISPOSABLE) ×4 IMPLANT
TOWEL GREEN STERILE FF (TOWEL DISPOSABLE) ×4 IMPLANT
TRAY FOLEY SLVR 16FR TEMP STAT (SET/KITS/TRAYS/PACK) ×4 IMPLANT
TUBE CONNECTING 20'X1/4 (TUBING) ×1
TUBE CONNECTING 20X1/4 (TUBING) ×3 IMPLANT
TUBING LAP HI FLOW INSUFFLATIO (TUBING) ×4 IMPLANT
UNDERPAD 30X36 HEAVY ABSORB (UNDERPADS AND DIAPERS) ×4 IMPLANT
WATER STERILE IRR 1000ML POUR (IV SOLUTION) ×8 IMPLANT

## 2020-08-15 NOTE — Op Note (Signed)
CARDIOVASCULAR SURGERY OPERATIVE NOTE  08/15/2020  Surgeon:  Alleen Borne, MD  First Assistant: Charlett Lango, MD   Second Assistant: Doree Fudge, PA-C   Preoperative Diagnosis:  Severe multi-vessel coronary artery disease   Postoperative Diagnosis:  Same   Procedure:  1. Median Sternotomy 2. Extracorporeal circulation 3.   Coronary artery bypass grafting x 4   Left internal mammary artery graft to the LAD  Right internal mammary artery graft to the RCA  Sequential SVG to OM1 and OM2  4.   Endoscopic vein harvest from the right leg   Anesthesia:  General Endotracheal   Clinical History/Surgical Indication:  This 75 year old woman has high-grade left main and ostial RCA stenosis as well as significant proximal to mid LAD stenosis with heavily calcification in the areas of stenosis. I agree that coronary artery bypass graft surgery is the best treatment event further ischemia and infarction. I discussed the operative procedure with the patient andher husbandincluding alternatives, benefits and risks; including but not limited to bleeding, blood transfusion, infection, stroke, myocardial infarction, graft failure, heart block requiring a permanent pacemaker, organ dysfunction, and death. Debbie Sauce Dugginsunderstands and agrees to proceed.   Preparation:  The patient was seen in the preoperative holding area and the correct patient, correct operation were confirmed with the patient after reviewing the medical record and catheterization. The consent was signed by me. Preoperative antibiotics were given. A pulmonary arterial line and radial arterial line were placed by the anesthesia team. The patient was taken back to the operating room and positioned supine on the operating room table. After being placed under general endotracheal anesthesia by the anesthesia team a  foley catheter was placed. The neck, chest, abdomen, and both legs were prepped with betadine soap and solution and draped in the usual sterile manner. A surgical time-out was taken and the correct patient and operative procedure were confirmed with the nursing and anesthesia staff.   Cardiopulmonary Bypass:  A median sternotomy was performed. The pericardium was opened in the midline. Right ventricular function appeared normal. The ascending aorta was of normal size and had no palpable plaque. There were no contraindications to aortic cannulation or cross-clamping. The patient was fully systemically heparinized and the ACT was maintained > 400 sec. The proximal aortic arch was cannulated with a 20 F aortic cannula for arterial inflow. Venous cannulation was performed via the right atrial appendage using a two-staged venous cannula. An antegrade cardioplegia/vent cannula was inserted into the mid-ascending aorta. Aortic occlusion was performed with a single cross-clamp. Systemic cooling to 32 degrees Centigrade and topical cooling of the heart with iced saline were used. Hyperkalemic antegrade cold blood cardioplegia was used to induce diastolic arrest and was then given at about 20 minute intervals throughout the period of arrest to maintain myocardial temperature at or below 10 degrees centigrade. A temperature probe was inserted into the interventricular septum and an insulating pad was placed in the pericardium.   Left internal mammary artery harvest:  The left side of the sternum was retracted using the Rultract retractor. The left internal mammary artery was harvested as a pedicle graft. All side branches were clipped. It was a medium-sized vessel of good quality with excellent blood flow. It was ligated distally and divided. It was sprayed with topical papaverine solution to prevent vasospasm.  Right internal mammary artery harvest:  The right IMA was harvested because there was not enough  adequate vein. The right side of the sternum was retracted using the Rultract retractor.  The right internal mammary artery was harvested as a pedicle graft. All side branches were clipped. It was a medium-sized vessel of good quality with excellent blood flow. It was ligated distally and divided. It was sprayed with topical papaverine solution to prevent vasospasm.  Endoscopic vein harvest:  The right greater saphenous vein was harvested endoscopically through a 2 cm incision medial to the right knee. It was harvested from the upper thigh to below the knee. It was necessary to make a longer incision medial to the knee to ligate multiple branches that could not be visualized endoscopically.  It was a medium-sized vein of good quality in the thigh but below that it was small and fragile and not felt to be suitable. The side branches were all ligated with 4-0 silk ties. We made an incision medial to the left knee to evaluate the left GSV but it was also too small.    Coronary arteries:  The coronary arteries were examined.   LAD:  Large vessel that was diffusely diseased. The diagonal was small and diffusely diseased.  LCX:  OM1 and OM2 were both medium sized vessels with no distal disease.  RCA:  This was located proximally within the AV groove since the stenosis was ostial and the RIMA pedicle was not long enough to reach the mid RCA. It was a large vessel with no significant disease at this location.   Grafts:  1. LIMA to the LAD: 2.5 mm. It was sewn end to side using 8-0 prolene continuous suture. 2. RIMA to the RCA:  2.5 mm. It was sewn end to side using 8-0 prolene continuous suture. 3. Sequential SVG to the OM1:  1.6 mm. It was sewn sequential side to side using 7-0 prolene continuous suture. 4. Sequential SVG to the OM2:  1.6 mm. It was sewn sequential end to side using 7-0 prolene continuous suture.  The proximal vein graft anastomosis was performed to the mid-ascending aorta using  continuous 6-0 prolene suture. A graft marker was placed around the proximal anastomosis.   Completion:  The patient was rewarmed to 37 degrees Centigrade. The clamp was removed from the LIMA and RIMA pedicles and there was rapid warming of the septum and return of ventricular fibrillation. The crossclamp was removed with a time of 73 minutes. There was spontaneous return of sinus rhythm. The distal and proximal anastomoses were checked for hemostasis. The position of the grafts was satisfactory. Two temporary epicardial pacing wires were placed on the right atrium and two on the right ventricle. The patient was weaned from CPB without difficulty on no inotropes. CPB time was 92 minutes. Cardiac output was 5 LPM. TEE showed normal LV systolic function.  Heparin was fully reversed with protamine and the aortic and venous cannulas removed. Hemostasis was achieved. Mediastinal and bilateral pleural drainage tubes were placed. The sternum was closed with  #6 stainless steel wires. The fascia was closed with continuous # 1 vicryl suture. The subcutaneous tissue was closed with 2-0 vicryl continuous suture. The skin was closed with 3-0 vicryl subcuticular suture. All sponge, needle, and instrument counts were reported correct at the end of the case. Dry sterile dressings were placed over the incisions and around the chest tubes which were connected to pleurevac suction. The patient was then transported to the surgical intensive care unit in stable condition.

## 2020-08-15 NOTE — Procedures (Signed)
Extubation Procedure Note  Patient Details:   Name: Debbie Cardenas DOB: 1945/10/27 MRN: [id number]   Airway Documentation:    Vent end date: 08/15/20 Vent end time: 2035   Evaluation  O2 sats: stable throughout Complications: No apparent complications Patient did tolerate procedure well. Bilateral Breath Sounds: Clear,Diminished   Yes     Patient performed NIF -30 and FVC 1.2L with good effort. RT extubated patient to 4L [name]. Patient able to speak name clearly.   Hiram Comber 08/15/2020, 8:39 PM

## 2020-08-15 NOTE — Anesthesia Procedure Notes (Signed)
Arterial Line Insertion Start/End1/17/2022 6:45 AM, 08/15/2020 6:50 AM Performed by: Waynard Edwards, CRNA, CRNA  Preanesthetic checklist: patient identified, IV checked, site marked, risks and benefits discussed, surgical consent, monitors and equipment checked, pre-op evaluation, timeout performed and anesthesia consent Lidocaine 1% used for infiltration Left, radial was placed Catheter size: 20 G Hand hygiene performed , maximum sterile barriers used  and Seldinger technique used Allen's test indicative of satisfactory collateral circulation Attempts: 1 Procedure performed without using ultrasound guided technique. Following insertion, dressing applied and Biopatch. Post procedure assessment: normal and unchanged  Patient tolerated the procedure well with no immediate complications.

## 2020-08-15 NOTE — Treatment Plan (Addendum)
1/17 21:44: Debbie Cardenas currently extubated, paced AAI at 80.  Debbie Cardenas is removed.  CT output about 20-88mL/H.    Continue plan, no adjustments.   1/18 5:17 AM: Awake, alert and oriented. On neo at 10 and paced AAI at 90.  CT rub.  Total CT output 262.  Labs and CXR pending.   Continue plan, no adjustments.

## 2020-08-15 NOTE — Transfer of Care (Signed)
Immediate Anesthesia Transfer of Care Note  Patient: Debbie Cardenas  Procedure(s) Performed: CORONARY ARTERY BYPASS GRAFTING (CABG)X4. USING BILATERAL MAMMARY ARTERIES AND RIGHT GREATER SAPHENOUS VEIN HARVESTED ENDOSCOPICALLY. (N/A Chest) TRANSESOPHAGEAL ECHOCARDIOGRAM (TEE) (N/A )  Patient Location: ICU  Anesthesia Type:General  Level of Consciousness: sedated and Patient remains intubated per anesthesia plan  Airway & Oxygen Therapy: Patient remains intubated per anesthesia plan and Patient placed on Ventilator (see vital sign flow sheet for setting)  Post-op Assessment: Report given to RN and Post -op Vital signs reviewed and stable  Post vital signs: Reviewed and stable  Last Vitals:  Vitals Value Taken Time  BP 104/66 08/15/20 1420  Temp    Pulse 80 08/15/20 1428  Resp 11 08/15/20 1428  SpO2 99 % 08/15/20 1428  Vitals shown include unvalidated device data.  Last Pain:  Vitals:   08/15/20 0545  TempSrc: Oral         Complications: No complications documented.

## 2020-08-15 NOTE — Discharge Summary (Signed)
Physician Discharge Summary       [address]       [place] [id number]             [phone]    Patient ID: Debbie Cardenas MRN: [id number] DOB/AGE: [birth year] 74 y.o.  Admit date: 08/15/2020 Discharge date: 08/24/2020  Admission Diagnoses: Coronary artery disease  Discharge Diagnoses:  1. S/P CABG x 4 2. Expected post op blood loss anemia 3. Left carotid artery stenosis 40-59% 4. History of hypertension 5. History of hyperlipidemia 6. History of closed fracture of neck of left femur (HCC) 7. Post operative paroxysmal atrial fibrillation  Consults: None  Procedure (s):  1. Median Sternotomy 2. Extracorporeal circulation 3.   Coronary artery bypass grafting x 4    Left internal mammary artery graft to the LAD  Right internal mammary artery graft to the RCA  Sequential SVG to OM1 and OM2   4.   Endoscopic vein harvest from the right leg by Dr. Laneta SimmersBartle on 08/15/2020.   History of Presenting Illness: The patient is a 75 year old woman with a history of hypertension, hyperlipidemia, a strong family history of coronary disease, former smoker and degenerative arthritis of both knees who reports having some twinges of pain in her left breast area in August 2021.  This was not particularly related to exertion but was associated with some pauses in her heart rhythm.  She notes that after these episodes she felt very fatigued.  She underwent a coronary CTA to stratify her risk and calcium score was over 1000.  There was suggestion of flow-limiting stenosis in the RCA and LAD territories although analysis was difficult due to heavy calcification of the coronary arteries.  It was felt to be a high risk scan.  She had an event monitor placed which was normal.  Cardiac catheterization on 07/27/2020 showed 70% mid to distal left main stenosis.  There is 50% ostial to proximal LAD stenosis and then 70% proximal to mid LAD stenosis.  There is a moderate-sized diagonal  branch that came off in this area of stenosis.  The first marginal had at least 50% stenosis.  The ostium of the RCA had 90% stenosis.  Left ventricular systolic function was normal with ejection fraction estimated 55 to 60%.  LVEDP was normal.    She lives with her husband who has had bypass surgery in the past by Dr. Tyrone SageGerhardt.  She said she has had no further chest discomfort and denies any shortness of breath or fatigue but has not been very active due to her degenerative knee arthritis.  She recently injured her right knee when the dog ran into her and has had a lot of pain and not been very active.  She is taking Ultram as needed for pain.  She smoked until about 5 years ago.  Dr. Laneta SimmersBartle discussed the need for coronary artery bypass grafting surgery. Potential risks, benefits, and complications of the surgery were discussed with the patient and she agreed to proceed with surgery. Pre operative carotid duplex US showed no significant right internal carotid artery stenosis and a 40-59% left internal carotid artery stenosis. Patient underwent a CABG x 4 on 08/15/2020.  Brief Hospital Course:  Patient was extubated the evening of surgery. She was initially AAI paced and was weaned off Neo Synephrine drip. Theone MurdochSwan Ganz, a line, chest tubes, and foley were all removed early in her post operative course. She was volume overloaded and diuresed accordingly. She was weaned off the  Insulin drip. Her pre op HGA1C was 4.6. Accu checks and SS PRN will be stopped upon transfer. She was started on Midodrine on 01/18. She was felt surgically stable for transfer to 4E for further convalescence on 08/18/2020. She continued to progress on the floor. We were able to decreased her midodrine dose since her BP remained stable.  We discontinued her epicardial pacing wires on 1/21. We continued her diuretics for fluid overload. On POD-5 she developed atrial fibrillation with controlled VR.  She was given an amiodarone bolus 150mg  IV  x 1 and started on oral amiodarone 400mg  BID.  She quickly converted back to SR but then had several more episodes of PAF. Her K+ and Mg++ was monitored and corrected as required. She was started on Eliquis.  She continues to have some short bursts of atrial fibrillation and sinus bradycardia but she is felt as though her rhythm will tolerate low-dose beta-blocker in addition to other therapy.   Today, she is ambulating with limited assistance, tolerating room air, her incisions are healing well, and she is ready for discharge.    Latest Vital Signs: Blood pressure (!) 110/56, pulse (!) 59, temperature 98.6 F (37 C), temperature source Oral, resp. rate 18, height 5\' 5"  (1.651 m), weight 72.8 kg, SpO2 99 %.  Physical Exam:  General appearance: alert, cooperative and no distress Heart: regular rate and rhythm Lungs: clear to auscultation bilaterally Abdomen: benign Extremities: trace edema Wound: incis healing well  Discharge Condition:Stable and discharged to HOME.  Recent laboratory studies:  Lab Results  Component Value Date   WBC 6.7 08/22/2020   HGB 8.1 (L) 08/22/2020   HCT 23.8 (L) 08/22/2020   MCV 92.6 08/22/2020   PLT 302 08/22/2020   Lab Results  Component Value Date   NA 139 08/22/2020   K 3.7 08/22/2020   CL 104 08/22/2020   CO2 26 08/22/2020   CREATININE 0.79 08/22/2020   GLUCOSE 96 08/22/2020      Diagnostic Studies: DG Chest 2 View  Result Date: 08/11/2020 CLINICAL DATA:  Hypertension. Reported coronary artery disease, preoperative coronary artery bypass grafting EXAM: CHEST - 2 VIEW COMPARISON:  Coronary CT July 08, 2020 FINDINGS: Lungs are clear. Heart size and pulmonary vascularity are normal. No adenopathy. Apparent calcification in the left anterior descending coronary artery. No bone lesions. Surgical clips right upper abdomen. IMPRESSION: Lungs clear. Heart size within normal limits. Coronary artery calcification noted. Electronically Signed   By:  08/24/2020 III M.D.   On: 08/11/2020 10:21   CARDIAC CATHETERIZATION  Result Date: 07/27/2020  Mid LM to Dist LM lesion is 70% stenosed.  Ost LAD to Prox LAD lesion is 50% stenosed.  Prox LAD to Mid LAD lesion is 70% stenosed.  1st Mrg lesion is 50% stenosed.  Dist Cx lesion is 30% stenosed with 30% stenosed side branch in LPAV.  Ost RCA to Prox RCA lesion is 90% stenosed.  The left ventricular systolic function is normal.  LV end diastolic pressure is normal.  The left ventricular ejection fraction is 55-65% by visual estimate.  1. 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 Normal LV function 3. Normal LVEDP Plan: patient has high risk anatomy that is not suitable for PCI. Recommend CT surgery evaluation for CABG.   DG Chest Port 1 View  Result Date: 08/17/2020 CLINICAL DATA:  Shortness of breath.  CABG. EXAM: PORTABLE CHEST 1 VIEW COMPARISON:  08/16/2020. FINDINGS: Interim  removal of bilateral chest tubes and mediastinal drainage catheters. Right IJ sheath in stable position. Interim improvement of tiny biapical pneumothoraces with minimal residual. Prior CABG. Stable cardiomegaly. Persistent bibasilar atelectasis/infiltrates. Small left pleural effusion. IMPRESSION: 1. Interim removal of bilateral chest tubes and mediastinal drainage catheters. Interim improvement of tiny biapical pneumothoraces with minimal residual. 2. Persistent bibasilar atelectasis/infiltrates. Small left pleural effusion. 3. Prior CABG.  Stable cardiomegaly. Electronically Signed   By: Maisie Fus  Register   On: 08/17/2020 06:56   DG Chest Port 1 View  Result Date: 08/16/2020 CLINICAL DATA:  Pleural effusion. EXAM: PORTABLE CHEST 1 VIEW COMPARISON:  August 15, 2020 FINDINGS: Chest tubes and mediastinal drains are again noted. The right-sided central venous catheter is stable in positioning. There are trace biapical pneumothoraces. The patient is status post prior  CABG. There is postoperative atelectasis at the lung bases with a probable small left-sided pleural effusion. IMPRESSION: 1. Lines and tubes as above. 2. Trace biapical pneumothoraces. Electronically Signed   By: Katherine Mantle M.D.   On: 08/16/2020 06:30   DG Chest Port 1 View  Result Date: 08/15/2020 CLINICAL DATA:  Pleural effusion. EXAM: PORTABLE CHEST 1 VIEW COMPARISON:  August 11, 2020. FINDINGS: Stable cardiomediastinal silhouette. Endotracheal and nasogastric tubes are in good position. Right internal jugular Swan-Ganz catheter is noted with distal tip in expected position of left pulmonary artery. Bilateral chest tubes are noted without pneumothorax. Minimal bibasilar subsegmental atelectasis is noted. Bony thorax is unremarkable. IMPRESSION: Endotracheal and nasogastric tubes are in good position. Bilateral chest tubes are noted without pneumothorax. Minimal bibasilar subsegmental atelectasis. Electronically Signed   By: Lupita Raider M.D.   On: 08/15/2020 14:46   ECHO INTRAOPERATIVE TEE  Result Date: 08/15/2020  *INTRAOPERATIVE TRANSESOPHAGEAL REPORT *  Patient Name:   HALLELUJAH WYSONG Date of Exam: 08/15/2020 Medical Rec #:  [id number]        Height:       65.0 in Accession #:    [id number]       Weight:       160.0 lb Date of Birth:  [birth year]        BSA:          1.80 m Patient Age:    74 years         BP:           150/108 mmHg Patient Gender: F                HR:           59 bpm. Exam Location:  Inpatient Transesophogeal exam was perform intraoperatively during surgical procedure. Patient was closely monitored under general anesthesia during the entirety of examination. Indications:     CABG Performing Phys: [id number] Alleen Borne Diagnosing Phys: Arrie Aran MD Complications: No known complications during this procedure. POST-OP IMPRESSIONS - Left Ventricle: The left ventricle is unchanged from pre-bypass. has normal systolic function, with an ejection fraction of 65%. The cavity size  was normal. The wall motion is normal. - Right Ventricle: The right ventricle appears unchanged from pre-bypass. - Aorta: The aorta appears unchanged from pre-bypass. - Left Atrium: The left atrium appears unchanged from pre-bypass. - Left Atrial Appendage: The left atrial appendage appears unchanged from pre-bypass. - Aortic Valve: The aortic valve appears unchanged from pre-bypass. - Mitral Valve: The mitral valve appears unchanged from pre-bypass. - Tricuspid Valve: There is Trivial regurgitation. - Interatrial Septum: The interatrial septum appears unchanged from pre-bypass. - Interventricular Septum: The  interventricular septum appears unchanged from pre-bypass. - Pericardium: The pericardium appears unchanged from pre-bypass. PRE-OP FINDINGS  Left Ventricle: The left ventricle has normal systolic function, with an ejection fraction of 60-65%. The cavity size was normal. There is no increase in left ventricular wall thickness. No evidence of left ventricular regional wall motion abnormalities. Right Ventricle: The right ventricle has normal systolic function. The cavity was normal. There is no increase in right ventricular wall thickness. Left Atrium: Left atrial size was normal in size. The left atrial appendage is well visualized and there is no evidence of thrombus present. Right Atrium: Right atrial size was normal in size. Interatrial Septum: No atrial level shunt detected by color flow Doppler. Pericardium: There is no evidence of pericardial effusion. Mitral Valve: The mitral valve is normal in structure. Mitral valve regurgitation is not visualized by color flow Doppler. There is No evidence of mitral stenosis. Tricuspid Valve: The tricuspid valve was normal in structure. Tricuspid valve regurgitation was not visualized by color flow Doppler. Aortic Valve: The aortic valve is tricuspid Aortic valve regurgitation was not visualized by color flow Doppler. There is no evidence of aortic valve stenosis.  Pulmonic Valve: The pulmonic valve was normal in structure. Pulmonic valve regurgitation is not visualized by color flow Doppler. Aorta: The aortic root, ascending aorta and aortic arch are normal in size and structure. There is evidence of layered immobile plaque in the descending aorta; Grade II, measuring 2-60mm in size. Pulmonary Artery: The pulmonary artery is of normal size.  Arrie Aran MD Electronically signed by Arrie Aran MD Signature Date/Time: 08/15/2020/4:55:52 PM    Final    VAS US DOPPLER PRE CABG  Result Date: 08/12/2020 PREOPERATIVE VASCULAR EVALUATION  Indications:      Pre-CABG. Risk Factors:     Hypertension, hyperlipidemia, past history of smoking,                   coronary artery disease. Other Factors:    Heart Cath (07/25/20). Comparison Study: No prior exams Performing Technologist: Ernestene Mention RVT/RDMS  Examination Guidelines: A complete evaluation includes B-mode imaging, spectral Doppler, color Doppler, and power Doppler as needed of all accessible portions of each vessel. Bilateral testing is considered an integral part of a complete examination. Limited examinations for reoccurring indications may be performed as noted.  Right Carotid Findings: +----------+--------+--------+--------+------------+------------------+           PSV cm/sEDV cm/sStenosisDescribe    Comments           +----------+--------+--------+--------+------------+------------------+ CCA Prox  66      11                          intimal thickening +----------+--------+--------+--------+------------+------------------+ CCA Distal62      9                           intimal thickening +----------+--------+--------+--------+------------+------------------+ ICA Prox  108     27      1-39%   heterogenous                   +----------+--------+--------+--------+------------+------------------+ ICA Distal75      21                                              +----------+--------+--------+--------+------------+------------------+ ECA  92      0                                              +----------+--------+--------+--------+------------+------------------+ Portions of this table do not appear on this page. +----------+--------+-------+----------------+------------+           PSV cm/sEDV cmsDescribe        Arm Pressure +----------+--------+-------+----------------+------------+ Subclavian75      0      Multiphasic, WNL             +----------+--------+-------+----------------+------------+ +---------+--------+--+--------+-+---------+ VertebralPSV cm/s36EDV cm/s9Antegrade +---------+--------+--+--------+-+---------+ Left Carotid Findings: +----------+--------+--------+--------+------------+------------------+           PSV cm/sEDV cm/sStenosisDescribe    Comments           +----------+--------+--------+--------+------------+------------------+ CCA Prox  74      13                          intimal thickening +----------+--------+--------+--------+------------+------------------+ CCA Distal71      13                          intimal thickening +----------+--------+--------+--------+------------+------------------+ ICA Prox  233     58      40-59%  heterogenous                   +----------+--------+--------+--------+------------+------------------+ ICA Mid   152     26      1-39%                                  +----------+--------+--------+--------+------------+------------------+ ICA Distal67      18                                             +----------+--------+--------+--------+------------+------------------+ ECA       88      0                                              +----------+--------+--------+--------+------------+------------------+ +----------+--------+--------+----------------+------------+ SubclavianPSV cm/sEDV cm/sDescribe        Arm Pressure  +----------+--------+--------+----------------+------------+           92      0       Multiphasic, WNL             +----------+--------+--------+----------------+------------+ +---------+--------+--+--------+-+--------+ VertebralPSV cm/s19EDV cm/s4dampened +---------+--------+--+--------+-+--------+  ABI Findings: +--------+------------------+-----+--------+--------+ Right   Rt Pressure (mmHg)IndexWaveformComment  +--------+------------------+-----+--------+--------+ Brachial130                    biphasic         +--------+------------------+-----+--------+--------+ PTA     152               1.09 biphasic         +--------+------------------+-----+--------+--------+ DP      147               1.05 biphasic         +--------+------------------+-----+--------+--------+ +--------+------------------+-----+---------+---------------------------+ Left    Lt Pressure (mmHg)IndexWaveform Comment                     +--------+------------------+-----+---------+---------------------------+  Brachial140                    triphasic                            +--------+------------------+-----+---------+---------------------------+ PTA     151               1.08 biphasic                             +--------+------------------+-----+---------+---------------------------+ DP      145               1.04 biphasic difficult to obtain doppler +--------+------------------+-----+---------+---------------------------+  Right Doppler Findings: +--------+--------+-----+---------+--------+ Site    PressureIndexDoppler  Comments +--------+--------+-----+---------+--------+ Brachial130          biphasic          +--------+--------+-----+---------+--------+ Radial               triphasic         +--------+--------+-----+---------+--------+ Ulnar                triphasic         +--------+--------+-----+---------+--------+  Left Doppler Findings:  +--------+--------+-----+---------+--------+ Site    PressureIndexDoppler  Comments +--------+--------+-----+---------+--------+ [id number]          triphasic         +--------+--------+-----+---------+--------+ Radial               triphasic         +--------+--------+-----+---------+--------+ Ulnar                biphasic          +--------+--------+-----+---------+--------+  Summary: Right Carotid: The extracranial vessels were near-normal with only minimal wall                thickening or plaque. Left Carotid: Velocities in the left ICA are consistent with a 40-59% stenosis. Vertebrals:  Right vertebral artery demonstrates antegrade flow. Left vertebral              - dampened. Subclavians: Normal flow hemodynamics were seen in bilateral subclavian              arteries. Right ABI: Resting right ankle-brachial index is within normal range. No evidence of significant right lower extremity arterial disease. Left ABI: Resting left ankle-brachial index is within normal range. No evidence of significant left lower extremity arterial disease. Right Upper Extremity: Doppler waveforms remain within normal limits with right radial compression. Doppler waveforms decrease 50% with right ulnar compression. Left Upper Extremity: Doppler waveforms remain within normal limits with left radial compression. Doppler waveform obliterate with left ulnar compression.  Electronically signed by Heath Larkhomas Hawken on 08/12/2020 at 2:54:21 PM.    Final    Discharge Instructions    Amb Referral to Cardiac Rehabilitation   Complete by: As directed    Diagnosis: CABG   CABG X ___: 4   After initial evaluation and assessments completed: Virtual Based Care may be provided alone or in conjunction with Phase 2 Cardiac Rehab based on patient barriers.: Yes   Discharge patient   Complete by: As directed    Discharge disposition: 01-Home or Self Care   Discharge patient date: 08/24/2020      Discharge  Medications: Allergies as of 08/24/2020      Reactions   Ace Inhibitors Cough      Medication List  STOP taking these medications   amLODipine 10 MG tablet Commonly known as: NORVASC   metoprolol succinate 25 MG 24 hr tablet Commonly known as: Toprol XL     TAKE these medications   acetaminophen 650 MG CR tablet Commonly known as: TYLENOL Take 1,300 mg by mouth 2 (two) times daily as needed for pain.   amiodarone 200 MG tablet Commonly known as: PACERONE Take 1 tablet (200 mg total) by mouth 2 (two) times daily.   apixaban 5 MG Tabs tablet Commonly known as: ELIQUIS Take 1 tablet (5 mg total) by mouth 2 (two) times daily.   aspirin EC 81 MG tablet Take 1 tablet (81 mg total) by mouth daily. Swallow whole.   ferrous fumarate-b12-vitamic C-folic acid capsule Commonly known as: TRINSICON / FOLTRIN Take 1 capsule by mouth 2 (two) times daily after a meal.   MAGNESIUM OXIDE PO Take 500 mg by mouth daily.   metoprolol tartrate 25 MG tablet Commonly known as: LOPRESSOR Take 0.5 tablets (12.5 mg total) by mouth 2 (two) times daily.   rosuvastatin 20 MG tablet Commonly known as: CRESTOR Take 1 tablet (20 mg total) by mouth daily.   traMADol 50 MG tablet Commonly known as: ULTRAM Take 1 tablet (50 mg total) by mouth every 6 (six) hours as needed for up to 7 days for moderate pain.   Vitamin D 50 MCG (2000 UT) tablet Take 2,000 Units by mouth daily.      The patient has been discharged on:   1.Beta Blocker:  Yes Gilian.[employer]raft   ]                              No   [   ]                              If No, reason:   2.Ace Inhibitor/ARB: Yes [   ]                                     No  [ no   ]                                     If No, reason: LABILE BP INITIALLY REQUIRING MIDODRINE(STOPPED CURRENTLY)  3.Statin:   Yes [ yes  ]                  No  [   ]                  If No, reason:  4.Ecasa:  Yes  [ yes  ]                  No   [   ]                  If No,  reason:  Follow Up Appointments:  Follow-up Information    Alleen Borne, MD. Go on 09/21/2020.   Specialty: Cardiothoracic Surgery Why: PA/LAT CXR to be taken (at [facility] which is in the same building as Dr. Sharee Pimple office) on 02/23 at 12:00 pm;Appointment time is at 12:30 pm Contact information: [address] [place]  [address] [phone]        Ronney Asters, NP. Go on 09/06/2020.   Specialty: Cardiology Why: Appointment is at 8:45 am Contact information: [address] [place] [address] [phone]        [facility]. Go on 08/30/2020.   Specialty: Cardiothoracic Surgery Why: Appointment is with nurse and time is at 11:00 am Contact information: [address] [place], [address] [place] [address] [phone]              Signed: Noel Christmas 08/24/2020, 11:23 AM

## 2020-08-15 NOTE — Progress Notes (Signed)
Patient ID: Debbie Cardenas, female   DOB: [birth year], 75 y.o.   MRN: [id number]  TCTS Evening Rounds:   Hemodynamically stable  CI = 2.4  Asleep on vent  Urine output good  CT output low  CBC    Component Value Date/Time   WBC 8.8 08/15/2020 1422   RBC 3.14 (L) 08/15/2020 1422   HGB 9.9 (L) 08/15/2020 1422   HGB 13.4 07/14/2020 1023   HCT 28.1 (L) 08/15/2020 1422   HCT 38.6 07/14/2020 1023   PLT 159 08/15/2020 1422   PLT 285 07/14/2020 1023   MCV 89.5 08/15/2020 1422   MCV 90 07/14/2020 1023   MCH 31.5 08/15/2020 1422   MCHC 35.2 08/15/2020 1422   RDW 12.6 08/15/2020 1422   RDW 12.6 07/14/2020 1023   LYMPHSABS 1.7 07/14/2020 1023   MONOABS 0.6 08/15/2018 1249   EOSABS 0.1 07/14/2020 1023   BASOSABS 0.1 07/14/2020 1023     BMET    Component Value Date/Time   NA 139 08/15/2020 1317   NA 141 07/14/2020 1023   K 3.7 08/15/2020 1317   CL 104 08/15/2020 1317   CO2 23 08/11/2020 0959   GLUCOSE 136 (H) 08/15/2020 1317   BUN 15 08/15/2020 1317   BUN 18 07/14/2020 1023   CREATININE 0.60 08/15/2020 1317   CALCIUM 9.6 08/11/2020 0959   GFRNONAA 54 (L) 08/11/2020 0959   GFRAA 58 (L) 07/14/2020 1023     A/P:  Stable postop course. Continue current plans

## 2020-08-15 NOTE — Anesthesia Procedure Notes (Signed)
Procedure Name: Intubation Date/Time: 08/15/2020 7:39 AM Performed by: Waynard Edwards, CRNA Pre-anesthesia Checklist: Patient identified, Emergency Drugs available, Suction available and Patient being monitored Patient Re-evaluated:Patient Re-evaluated prior to induction Oxygen Delivery Method: Circle system utilized Preoxygenation: Pre-oxygenation with 100% oxygen Induction Type: IV induction Ventilation: Mask ventilation without difficulty and Oral airway inserted - appropriate to patient size Laryngoscope Size: Miller and 3 Grade View: Grade I Tube type: Oral Tube size: 7.0 mm Number of attempts: 1 Airway Equipment and Method: Stylet and Oral airway Placement Confirmation: ETT inserted through vocal cords under direct vision,  positive ETCO2 and breath sounds checked- equal and bilateral Secured at: 22 cm Tube secured with: Tape Dental Injury: Teeth and Oropharynx as per pre-operative assessment

## 2020-08-15 NOTE — Brief Op Note (Addendum)
08/15/2020  1:12 PM  PATIENT:  Debbie Cardenas  75 y.o. female  PRE-OPERATIVE DIAGNOSIS:  Coronary Artery Disease  POST-OPERATIVE DIAGNOSIS:  Coronary Artery Disease  PROCEDURE:  TRANSESOPHAGEAL ECHOCARDIOGRAM (TEE), CORONARY ARTERY BYPASS GRAFTING (CABG)X4 (LIMA to LAD, SVG SEQUENTIALLY to OM1 and OM2, and RIMA to RCA) USING BILATERAL MAMMARY ARTERIES AND RIGHT GREATER SAPHENOUS VEIN HARVESTED ENDOSCOPICALLY EVH HARVEST TIME: 74 minutes;EVH PREP TIME: 20 minutes  SURGEON:  Surgeon(s) and Role: 1. Alleen Borne, MD - Primary 2. Loreli Slot, MD - Assisting  PHYSICIAN ASSISTANT: Doree Fudge PA-C  ASSISTANTS: Virgilio Frees RNFA  ANESTHESIA:   general  EBL:  Per anesthesia and perfusion record  DRAINS: Chest tubes placed in the mediastinal and pleural spaces   COUNTS CORRECT:  YES  DICTATION: .Dragon Dictation  PLAN OF CARE: Admit to inpatient   PATIENT DISPOSITION:  ICU - intubated and hemodynamically stable.   Delay start of Pharmacological VTE agent (>24hrs) due to surgical blood loss or risk of bleeding: yes  BASELINE WEIGHT: 72.6 kg

## 2020-08-15 NOTE — Anesthesia Postprocedure Evaluation (Signed)
Anesthesia Post Note  Patient: Debbie Cardenas  Procedure(s) Performed: CORONARY ARTERY BYPASS GRAFTING (CABG)X4. USING BILATERAL MAMMARY ARTERIES AND RIGHT GREATER SAPHENOUS VEIN HARVESTED ENDOSCOPICALLY. (N/A Chest) TRANSESOPHAGEAL ECHOCARDIOGRAM (TEE) (N/A )     Patient location during evaluation: SICU Anesthesia Type: General Level of consciousness: sedated Pain management: pain level controlled Vital Signs Assessment: post-procedure vital signs reviewed and stable Respiratory status: patient remains intubated per anesthesia plan Cardiovascular status: stable Postop Assessment: no apparent nausea or vomiting Anesthetic complications: no   No complications documented.  Last Vitals:  Vitals:   08/15/20 1500 08/15/20 1600  BP: 121/74 108/69  Pulse: 80 80  Resp: 14 14  Temp: (!) 35.7 C (!) 35.9 C  SpO2: 100% 100%    Last Pain:  Vitals:   08/15/20 1600  TempSrc: Core (Comment)                 Cecile Hearing

## 2020-08-15 NOTE — Anesthesia Procedure Notes (Signed)
Central Venous Catheter Insertion Performed by: Cecile Hearing, MD, anesthesiologist Start/End1/17/2022 6:50 AM, 08/15/2020 7:00 AM Patient location: Pre-op. Preanesthetic checklist: patient identified, IV checked, site marked, risks and benefits discussed, surgical consent, monitors and equipment checked, pre-op evaluation, timeout performed and anesthesia consent Position: Trendelenburg Lidocaine 1% used for infiltration and patient sedated Hand hygiene performed , maximum sterile barriers used  and Seldinger technique used Catheter size: 8.5 Fr Central line was placed.Sheath introducer Procedure performed using ultrasound guided technique. Ultrasound Notes:anatomy identified, needle tip was noted to be adjacent to the nerve/plexus identified, no ultrasound evidence of intravascular and/or intraneural injection and image(s) printed for medical record Attempts: 1 Following insertion, line sutured, dressing applied and Biopatch. Post procedure assessment: free fluid flow, blood return through all ports and no air  Patient tolerated the procedure well with no immediate complications.

## 2020-08-15 NOTE — Anesthesia Procedure Notes (Signed)
Central Venous Catheter Insertion Performed by: Cecile Hearing, MD, anesthesiologist Start/End1/17/2022 7:00 AM, 08/15/2020 7:10 AM Patient location: Pre-op. Preanesthetic checklist: patient identified, IV checked, site marked, risks and benefits discussed, surgical consent, monitors and equipment checked, pre-op evaluation and timeout performed Position: Trendelenburg Hand hygiene performed  and maximum sterile barriers used  Total catheter length 100. PA cath was placed.Swan type:thermodilution PA Cath depth:60 Procedure performed without using ultrasound guided technique. Attempts: 1 Patient tolerated the procedure well with no immediate complications.

## 2020-08-15 NOTE — Progress Notes (Signed)
Pt extubated to 4L [place] at 2030 per protocol by RT Jomarie Longs. Able to verbalize name and birthday, cough on command, do IS.

## 2020-08-15 NOTE — Interval H&P Note (Signed)
History and Physical Interval Note:  08/15/2020 7:08 AM  Debbie Cardenas  has presented today for surgery, with the diagnosis of CAD.  The various methods of treatment have been discussed with the patient and family. After consideration of risks, benefits and other options for treatment, the patient has consented to  Procedure(s): CORONARY ARTERY BYPASS GRAFTING (CABG) (N/A) TRANSESOPHAGEAL ECHOCARDIOGRAM (TEE) (N/A) as a surgical intervention.  The patient's history has been reviewed, patient examined, no change in status, stable for surgery.  I have reviewed the patient's chart and labs.  Questions were answered to the patient's satisfaction.     Alleen Borne

## 2020-08-16 ENCOUNTER — Inpatient Hospital Stay (HOSPITAL_COMMUNITY): Payer: Medicare Other

## 2020-08-16 ENCOUNTER — Encounter (HOSPITAL_COMMUNITY): Payer: Self-pay | Admitting: Surgery

## 2020-08-16 LAB — CBC
HCT: 28.7 % — ABNORMAL LOW (ref 36.0–46.0)
HCT: 27.9 % — ABNORMAL LOW (ref 36.0–46.0)
HCT: 26.6 % — ABNORMAL LOW (ref 36.0–46.0)
Hemoglobin: 9.5 g/dL — ABNORMAL LOW (ref 12.0–15.0)
Hemoglobin: 9.7 g/dL — ABNORMAL LOW (ref 12.0–15.0)
Hemoglobin: 9.2 g/dL — ABNORMAL LOW (ref 12.0–15.0)
MCH: 30.8 pg (ref 26.0–34.0)
MCH: 32.1 pg (ref 26.0–34.0)
MCH: 32.2 pg (ref 26.0–34.0)
MCHC: 33.1 g/dL (ref 30.0–36.0)
MCHC: 34.8 g/dL (ref 30.0–36.0)
MCHC: 34.6 g/dL (ref 30.0–36.0)
MCV: 93.2 fL (ref 80.0–100.0)
MCV: 92.4 fL (ref 80.0–100.0)
MCV: 93 fL (ref 80.0–100.0)
Platelets: 187 10*3/uL (ref 150–400)
Platelets: 209 10*3/uL (ref 150–400)
Platelets: 176 10*3/uL (ref 150–400)
RBC: 3.08 MIL/uL — ABNORMAL LOW (ref 3.87–5.11)
RBC: 3.02 MIL/uL — ABNORMAL LOW (ref 3.87–5.11)
RBC: 2.86 MIL/uL — ABNORMAL LOW (ref 3.87–5.11)
RDW: 13.2 % (ref 11.5–15.5)
RDW: 13.4 % (ref 11.5–15.5)
RDW: 13.6 % (ref 11.5–15.5)
WBC: 10.8 10*3/uL — ABNORMAL HIGH (ref 4.0–10.5)
WBC: 13.4 10*3/uL — ABNORMAL HIGH (ref 4.0–10.5)
WBC: 11.4 10*3/uL — ABNORMAL HIGH (ref 4.0–10.5)
nRBC: 0 % (ref 0.0–0.2)
nRBC: 0 % (ref 0.0–0.2)
nRBC: 0 % (ref 0.0–0.2)

## 2020-08-16 LAB — GLUCOSE, CAPILLARY
Glucose-Capillary: 105 mg/dL — ABNORMAL HIGH (ref 70–99)
Glucose-Capillary: 106 mg/dL — ABNORMAL HIGH (ref 70–99)
Glucose-Capillary: 106 mg/dL — ABNORMAL HIGH (ref 70–99)
Glucose-Capillary: 107 mg/dL — ABNORMAL HIGH (ref 70–99)
Glucose-Capillary: 108 mg/dL — ABNORMAL HIGH (ref 70–99)
Glucose-Capillary: 97 mg/dL (ref 70–99)
Glucose-Capillary: 98 mg/dL (ref 70–99)
Glucose-Capillary: 99 mg/dL (ref 70–99)

## 2020-08-16 LAB — BASIC METABOLIC PANEL
Anion gap: 8 (ref 5–15)
Anion gap: 7 (ref 5–15)
BUN: 15 mg/dL (ref 8–23)
BUN: 16 mg/dL (ref 8–23)
CO2: 22 mmol/L (ref 22–32)
CO2: 23 mmol/L (ref 22–32)
Calcium: 8.2 mg/dL — ABNORMAL LOW (ref 8.9–10.3)
Calcium: 8.3 mg/dL — ABNORMAL LOW (ref 8.9–10.3)
Chloride: 110 mmol/L (ref 98–111)
Chloride: 109 mmol/L (ref 98–111)
Creatinine, Ser: 0.88 mg/dL (ref 0.44–1.00)
Creatinine, Ser: 0.86 mg/dL (ref 0.44–1.00)
GFR, Estimated: >60 mL/min (ref >60)
GFR, Estimated: >60 mL/min (ref >60)
Glucose, Bld: 102 mg/dL — ABNORMAL HIGH (ref 70–99)
Glucose, Bld: 102 mg/dL — ABNORMAL HIGH (ref 70–99)
Potassium: 3.7 mmol/L (ref 3.5–5.1)
Potassium: 3.9 mmol/L (ref 3.5–5.1)
Sodium: 140 mmol/L (ref 135–145)
Sodium: 139 mmol/L (ref 135–145)

## 2020-08-16 LAB — MAGNESIUM
Magnesium: 2.6 mg/dL — ABNORMAL HIGH (ref 1.7–2.4)
Magnesium: 2.1 mg/dL (ref 1.7–2.4)

## 2020-08-16 LAB — CREATININE, SERUM
Creatinine, Ser: 0.87 mg/dL (ref 0.44–1.00)
GFR, Estimated: >60 mL/min (ref >60)

## 2020-08-16 MED ORDER — MIDODRINE HCL 5 MG PO TABS
10.0000 mg | ORAL_TABLET | Freq: Three times a day (TID) | ORAL | Status: DC
  Administered 2020-08-16 – 2020-08-18 (×6): 10 mg via ORAL
  Filled 2020-08-16 (×6): qty 2

## 2020-08-16 MED ORDER — POTASSIUM CHLORIDE 10 MEQ/50ML IV SOLN
10.0000 meq | INTRAVENOUS | Status: AC
  Administered 2020-08-16 (×3): 10 meq via INTRAVENOUS
  Filled 2020-08-16 (×3): qty 50

## 2020-08-16 MED ORDER — INSULIN ASPART 100 UNIT/ML ~~LOC~~ SOLN: 0.0000 [IU] | SUBCUTANEOUS | Status: DC

## 2020-08-16 MED ORDER — ENOXAPARIN SODIUM 40 MG/0.4ML ~~LOC~~ SOLN
40.0000 mg | Freq: Every day | SUBCUTANEOUS | Status: DC
  Administered 2020-08-16 – 2020-08-17 (×2): 40 mg via SUBCUTANEOUS
  Filled 2020-08-16 (×2): qty 0.4

## 2020-08-16 MED FILL — Thrombin (Recombinant) For Soln 20000 Unit: CUTANEOUS | Qty: 1 | Status: AC

## 2020-08-16 NOTE — Progress Notes (Signed)
1 Day Post-Op Procedure(s) (LRB): CORONARY ARTERY BYPASS GRAFTING (CABG)X4. USING BILATERAL MAMMARY ARTERIES AND RIGHT GREATER SAPHENOUS VEIN HARVESTED ENDOSCOPICALLY. (N/A) TRANSESOPHAGEAL ECHOCARDIOGRAM (TEE) (N/A) Subjective: No complaints  Objective: Vital signs in last 24 hours: Temp:  [96.26 F (35.7 C)-99.8 F (37.7 C)] 98.5 F (36.9 C) (01/18 0351) Pulse Rate:  [79-90] 90 (01/18 0700) Cardiac Rhythm: Atrial paced (01/18 0400) Resp:  [11-25] 14 (01/18 0700) BP: (84-124)/(55-74) 113/60 (01/18 0600) SpO2:  [99 %-100 %] 99 % (01/18 0700) Arterial Line BP: (113-144)/(45-82) 123/45 (01/18 0700) FiO2 (%):  [40 %-50 %] 40 % (01/17 1953) Weight:  [76 kg] 76 kg (01/18 0624)  Hemodynamic parameters for last 24 hours: PAP: (18-20)/(8-10) 20/10 CO:  [3.8 L/min] 3.8 L/min CI:  [2.1 L/min/m2] 2.1 L/min/m2  Intake/Output from previous day: 01/17 0701 - 01/18 0700 In: 5151.3 [I.V.:2809.4; Blood:350; IV Piggyback:1991.9] Out: 2662 [Urine:1320; Blood:900; Chest Tube:442] Intake/Output this shift: No intake/output data recorded.  General appearance: alert and cooperative Neurologic: intact Heart: regular rate and rhythm, S1, S2 normal Lungs: clear to auscultation bilaterally Extremities: edema mild Wound: dressings dry  Lab Results: Recent Labs    08/15/20 1958 08/15/20 2016 08/15/20 2149 08/16/20 0404  WBC 10.6*  --   --  10.8*  HGB 10.4*   < > 8.8* 9.5*  HCT 31.3*   < > 26.0* 28.7*  PLT 182  --   --  187   < > = values in this interval not displayed.   BMET:  Recent Labs    08/15/20 1958 08/15/20 2016 08/15/20 2149 08/16/20 0404  NA 139   < > 142 140  K 4.1   < > 4.0 3.7  CL 111  --   --  110  CO2 22  --   --  22  GLUCOSE 110*  --   --  102*  BUN 13  --   --  15  CREATININE 0.79  --   --  0.88  CALCIUM 8.3*  --   --  8.2*   < > = values in this interval not displayed.    PT/INR:  Recent Labs    08/15/20 1422  LABPROT 16.5*  INR 1.4*   ABG     Component Value Date/Time   PHART 7.346 (L) 08/15/2020 2149   HCO3 22.0 08/15/2020 2149   TCO2 23 08/15/2020 2149   ACIDBASEDEF 3.0 (H) 08/15/2020 2149   O2SAT 97.0 08/15/2020 2149   CBG (last 3)  Recent Labs    08/16/20 0120 08/16/20 0206 08/16/20 0404  GLUCAP 99 106* 98   CXR: ok. Tiny bilateral apical ptx.  ECG: sinus brady, no acute changes  Assessment/Plan: S/P Procedure(s) (LRB): CORONARY ARTERY BYPASS GRAFTING (CABG)X4. USING BILATERAL MAMMARY ARTERIES AND RIGHT GREATER SAPHENOUS VEIN HARVESTED ENDOSCOPICALLY. (N/A) TRANSESOPHAGEAL ECHOCARDIOGRAM (TEE) (N/A)  POD 1 Hemodynamically stable in sinus brady rhythm. Will continue atrial pacing for now until off neo. DC Lopressor with sinus brady rhythm.  DC chest tubes.  Volume excess: wt is 7.5 lbs over preop. Will start diuresis once off neo.  Replace K+  IS, OOB.  No hx of DM and glucose under good control. Will DC SSI.   LOS: 1 day    Alleen Borne 08/16/2020

## 2020-08-16 NOTE — Progress Notes (Signed)
[facility] BRIEF SICU PROGRESS NOTE  1 Day Post-Op  S/P Procedure(s) (LRB): CORONARY ARTERY BYPASS GRAFTING (CABG)X4. USING BILATERAL MAMMARY ARTERIES AND RIGHT GREATER SAPHENOUS VEIN HARVESTED ENDOSCOPICALLY. (N/A) TRANSESOPHAGEAL ECHOCARDIOGRAM (TEE) (N/A)   Stable day AAI paced w/ stable BP, still on low dose Neo for BP Breathing comfortably w/ O2 sats 97%  Plan: Continue current plan  Purcell Nails, MD 08/16/2020 5:07 PM

## 2020-08-17 ENCOUNTER — Inpatient Hospital Stay (HOSPITAL_COMMUNITY): Payer: Medicare Other

## 2020-08-17 ENCOUNTER — Other Ambulatory Visit (HOSPITAL_COMMUNITY): Payer: Medicare Other

## 2020-08-17 LAB — GLUCOSE, CAPILLARY
Glucose-Capillary: 90 mg/dL (ref 70–99)
Glucose-Capillary: 109 mg/dL — ABNORMAL HIGH (ref 70–99)
Glucose-Capillary: 109 mg/dL — ABNORMAL HIGH (ref 70–99)
Glucose-Capillary: 132 mg/dL — ABNORMAL HIGH (ref 70–99)
Glucose-Capillary: 117 mg/dL — ABNORMAL HIGH (ref 70–99)

## 2020-08-17 LAB — BASIC METABOLIC PANEL
Anion gap: 7 (ref 5–15)
BUN: 19 mg/dL (ref 8–23)
CO2: 23 mmol/L (ref 22–32)
Calcium: 8.4 mg/dL — ABNORMAL LOW (ref 8.9–10.3)
Chloride: 108 mmol/L (ref 98–111)
Creatinine, Ser: 0.79 mg/dL (ref 0.44–1.00)
GFR, Estimated: >60 mL/min (ref >60)
Glucose, Bld: 95 mg/dL (ref 70–99)
Potassium: 3.7 mmol/L (ref 3.5–5.1)
Sodium: 138 mmol/L (ref 135–145)

## 2020-08-17 LAB — CBC
HCT: 26.8 % — ABNORMAL LOW (ref 36.0–46.0)
Hemoglobin: 8.7 g/dL — ABNORMAL LOW (ref 12.0–15.0)
MCH: 31 pg (ref 26.0–34.0)
MCHC: 32.5 g/dL (ref 30.0–36.0)
MCV: 95.4 fL (ref 80.0–100.0)
Platelets: 168 10*3/uL (ref 150–400)
RBC: 2.81 MIL/uL — ABNORMAL LOW (ref 3.87–5.11)
RDW: 13.5 % (ref 11.5–15.5)
WBC: 12.3 10*3/uL — ABNORMAL HIGH (ref 4.0–10.5)
nRBC: 0 % (ref 0.0–0.2)

## 2020-08-17 MED ORDER — ALPRAZOLAM 0.25 MG PO TABS
0.2500 mg | ORAL_TABLET | Freq: Once | ORAL | Status: AC
  Administered 2020-08-17: 0.25 mg via ORAL
  Filled 2020-08-17: qty 1

## 2020-08-17 MED ORDER — FUROSEMIDE 10 MG/ML IJ SOLN
40.0000 mg | Freq: Once | INTRAMUSCULAR | Status: AC
  Administered 2020-08-17: 40 mg via INTRAVENOUS
  Filled 2020-08-17: qty 4

## 2020-08-17 MED ORDER — POTASSIUM CHLORIDE CRYS ER 20 MEQ PO TBCR
20.0000 meq | EXTENDED_RELEASE_TABLET | Freq: Once | ORAL | Status: AC
  Administered 2020-08-17: 20 meq via ORAL
  Filled 2020-08-17: qty 1

## 2020-08-17 MED ORDER — POTASSIUM CHLORIDE CRYS ER 20 MEQ PO TBCR
20.0000 meq | EXTENDED_RELEASE_TABLET | ORAL | Status: AC
  Administered 2020-08-17 (×3): 20 meq via ORAL
  Filled 2020-08-17 (×3): qty 1

## 2020-08-17 NOTE — Plan of Care (Signed)

## 2020-08-17 NOTE — Plan of Care (Signed)
  Problem: Education: Goal: Knowledge of General Education information will improve Description: Including pain rating scale, medication(s)/side effects and non-pharmacologic comfort measures 08/17/2020 2220 by Peyton Bottoms, RN Outcome: Progressing 08/17/2020 2220 by Peyton Bottoms, RN Outcome: Progressing   Problem: Health Behavior/Discharge Planning: Goal: Ability to manage health-related needs will improve 08/17/2020 2220 by Peyton Bottoms, RN Outcome: Progressing 08/17/2020 2220 by Peyton Bottoms, RN Outcome: Progressing   Problem: Clinical Measurements: Goal: Ability to maintain clinical measurements within normal limits will improve 08/17/2020 2220 by Peyton Bottoms, RN Outcome: Progressing 08/17/2020 2220 by Peyton Bottoms, RN Outcome: Progressing Goal: Will remain free from infection 08/17/2020 2220 by Peyton Bottoms, RN Outcome: Progressing 08/17/2020 2220 by Peyton Bottoms, RN Outcome: Progressing Goal: Diagnostic test results will improve 08/17/2020 2220 by Peyton Bottoms, RN Outcome: Progressing 08/17/2020 2220 by Peyton Bottoms, RN Outcome: Progressing Goal: Respiratory complications will improve 08/17/2020 2220 by Peyton Bottoms, RN Outcome: Progressing 08/17/2020 2220 by Peyton Bottoms, RN Outcome: Progressing Goal: Cardiovascular complication will be avoided 08/17/2020 2220 by Peyton Bottoms, RN Outcome: Progressing 08/17/2020 2220 by Peyton Bottoms, RN Outcome: Progressing   Problem: Activity: Goal: Risk for activity intolerance will decrease 08/17/2020 2220 by Peyton Bottoms, RN Outcome: Progressing 08/17/2020 2220 by Peyton Bottoms, RN Outcome: Progressing   Problem: Nutrition: Goal: Adequate nutrition will be maintained 08/17/2020 2220 by Peyton Bottoms, RN Outcome: Progressing 08/17/2020 2220 by Peyton Bottoms, RN Outcome: Progressing   Problem: Coping: Goal: Level of anxiety will  decrease 08/17/2020 2220 by Peyton Bottoms, RN Outcome: Progressing 08/17/2020 2220 by Peyton Bottoms, RN Outcome: Progressing   Problem: Elimination: Goal: Will not experience complications related to bowel motility 08/17/2020 2220 by Peyton Bottoms, RN Outcome: Progressing 08/17/2020 2220 by Peyton Bottoms, RN Outcome: Progressing Goal: Will not experience complications related to urinary retention 08/17/2020 2220 by Peyton Bottoms, RN Outcome: Progressing 08/17/2020 2220 by Peyton Bottoms, RN Outcome: Progressing   Problem: Pain Managment: Goal: General experience of comfort will improve 08/17/2020 2220 by Peyton Bottoms, RN Outcome: Progressing 08/17/2020 2220 by Peyton Bottoms, RN Outcome: Progressing   Problem: Safety: Goal: Ability to remain free from injury will improve 08/17/2020 2220 by Peyton Bottoms, RN Outcome: Progressing 08/17/2020 2220 by Peyton Bottoms, RN Outcome: Progressing   Problem: Skin Integrity: Goal: Risk for impaired skin integrity will decrease 08/17/2020 2220 by Peyton Bottoms, RN Outcome: Progressing 08/17/2020 2220 by Peyton Bottoms, RN Outcome: Progressing

## 2020-08-17 NOTE — Progress Notes (Signed)
Shift Summary:  N: AAOx4, anxious and received 0.25mg  one time order Xanax, MAE, ambulated x2 and tolerating sitting in chair. C/O midsternal surgical pain, managed with scheduled acetaminophen and one time tramadol.   R: Weaned from [facility] to RA, satting 91% during ambulation and sleeping. Clear lungs, no cough.   CV: NSR, AV wires disconnected from pacer box. Able to wean off Neo gtt with PO scheduled midrodine.   GI: C/o nausea, unresolved with prn zofran. Poor appetite. BG WDL. No BM and refused bowel regimen.   GU: Foley removed, voided in toilet this evening.   Skin: intact, no new issues. Surgical incisions CDI.   No labs of concern. Husband at bedside.  Dayzha Pogosyan RN

## 2020-08-17 NOTE — Progress Notes (Signed)
EVENING ROUNDS NOTE :     [address]       [place] [address]             [phone]                 2 Days Post-Op Procedure(s) (LRB): CORONARY ARTERY BYPASS GRAFTING (CABG)X4. USING BILATERAL MAMMARY ARTERIES AND RIGHT GREATER SAPHENOUS VEIN HARVESTED ENDOSCOPICALLY. (N/A) TRANSESOPHAGEAL ECHOCARDIOGRAM (TEE) (N/A)   Total Length of Stay:  LOS: 2 days  Events:   No events Off Neo-Synephrine Resting comfortably in bed.    BP (!) 119/107   Pulse 76   Temp 98.5 F (36.9 C) (Oral)   Resp 17   Ht 5\' 5"  (1.651 m)   Wt 75.8 kg   SpO2 96%   BMI 27.81 kg/m         . sodium chloride Stopped (08/15/20 1717)  . sodium chloride    . sodium chloride 20 mL/hr at 08/15/20 1446  . lactated ringers    . lactated ringers Stopped (08/16/20 0924)  . phenylephrine (NEO-SYNEPHRINE) Adult infusion 20 mcg/min (08/17/20 1100)    I/O last 3 completed shifts: In: 855.1 [P.O.:120; I.V.:328.3; IV Piggyback:406.8] Out: 661 [Urine:661]   CBC Latest Ref Rng & Units 08/17/2020 08/16/2020 08/16/2020  WBC 4.0 - 10.5 K/uL 12.3(H) 11.4(H) 13.4(H)  Hemoglobin 12.0 - 15.0 g/dL 08/18/2020) 7.5(F) 1.6(B)  Hematocrit 36.0 - 46.0 % 26.8(L) 26.6(L) 27.9(L)  Platelets 150 - 400 K/uL 168 176 209    BMP Latest Ref Rng & Units 08/17/2020 08/16/2020 08/16/2020  Glucose 70 - 99 mg/dL 95 08/18/2020) -  BUN 8 - 23 mg/dL 19 16 -  Creatinine 659(D - 1.00 mg/dL 3.57 0.17 7.93  BUN/Creat Ratio 12 - 28 - - -  Sodium 135 - 145 mmol/L 138 139 -  Potassium 3.5 - 5.1 mmol/L 3.7 3.9 -  Chloride 98 - 111 mmol/L 108 109 -  CO2 22 - 32 mmol/L 23 23 -  Calcium 8.9 - 10.3 mg/dL 9.03) 8.3(L) -    ABG    Component Value Date/Time   PHART 7.346 (L) 08/15/2020 2149   PCO2ART 40.5 08/15/2020 2149   PO2ART 104 08/15/2020 2149   HCO3 22.0 08/15/2020 2149   TCO2 23 08/15/2020 2149   ACIDBASEDEF 3.0 (H) 08/15/2020 2149   O2SAT 97.0 08/15/2020 2149       2150, MD 08/17/2020 7:34 PM

## 2020-08-17 NOTE — Progress Notes (Signed)
2 Days Post-Op Procedure(s) (LRB): CORONARY ARTERY BYPASS GRAFTING (CABG)X4. USING BILATERAL MAMMARY ARTERIES AND RIGHT GREATER SAPHENOUS VEIN HARVESTED ENDOSCOPICALLY. (N/A) TRANSESOPHAGEAL ECHOCARDIOGRAM (TEE) (N/A) Subjective: No complaints. Walked twice already today.  Objective: Vital signs in last 24 hours: Temp:  [98.7 F (37.1 C)-99.2 F (37.3 C)] 99 F (37.2 C) (01/19 0715) Pulse Rate:  [56-101] 64 (01/19 0715) Cardiac Rhythm: Normal sinus rhythm (01/19 0400) Resp:  [11-23] 12 (01/19 0715) BP: (82-139)/(40-79) 98/51 (01/19 0715) SpO2:  [76 %-100 %] 98 % (01/19 0715) Arterial Line BP: (110-137)/(40-47) 133/44 (01/18 1145) Weight:  [75.8 kg] 75.8 kg (01/19 0500)  Hemodynamic parameters for last 24 hours:    Intake/Output from previous day: 01/18 0701 - 01/19 0700 In: 575.2 [I.V.:268.5; IV Piggyback:306.8] Out: 360 [Urine:360] Intake/Output this shift: No intake/output data recorded.  General appearance: alert and cooperative Neurologic: intact Heart: regular rate and rhythm, S1, S2 normal, no murmur Lungs: diminished breath sounds bibasilar Extremities: edema mild Wound: dressing dry  Lab Results: Recent Labs    08/16/20 1700 08/17/20 0422  WBC 11.4* 12.3*  HGB 9.2* 8.7*  HCT 26.6* 26.8*  PLT 176 168   BMET:  Recent Labs    08/16/20 1700 08/17/20 0422  NA 139 138  K 3.9 3.7  CL 109 108  CO2 23 23  GLUCOSE 102* 95  BUN 16 19  CREATININE 0.86 0.79  CALCIUM 8.3* 8.4*    PT/INR:  Recent Labs    08/15/20 1422  LABPROT 16.5*  INR 1.4*   ABG    Component Value Date/Time   PHART 7.346 (L) 08/15/2020 2149   HCO3 22.0 08/15/2020 2149   TCO2 23 08/15/2020 2149   ACIDBASEDEF 3.0 (H) 08/15/2020 2149   O2SAT 97.0 08/15/2020 2149   CBG (last 3)  Recent Labs    08/16/20 1957 08/16/20 2329 08/17/20 0423  GLUCAP 97 106* 90   CXR: bibasilar atelectasis and small effusions  Assessment/Plan: S/P Procedure(s) (LRB): CORONARY ARTERY BYPASS  GRAFTING (CABG)X4. USING BILATERAL MAMMARY ARTERIES AND RIGHT GREATER SAPHENOUS VEIN HARVESTED ENDOSCOPICALLY. (N/A) TRANSESOPHAGEAL ECHOCARDIOGRAM (TEE) (N/A)  POD 2 Still requiring low dose neo to support BP. Midodrine started last night so should have some effect today. Wean neo as tolerated. No BB at this time. Sinus rhythm in 80's. Can roll and tape pacer wires.  Volume excess: wt is 7 lbs over preop. Holding on diuresis until stable off neo.  DC foley.  Continue IS, ambulation.  Will need to keep in ICU until off neo.   LOS: 2 days    Alleen Borne 08/17/2020

## 2020-08-18 LAB — BASIC METABOLIC PANEL
Anion gap: 8 (ref 5–15)
BUN: 20 mg/dL (ref 8–23)
CO2: 24 mmol/L (ref 22–32)
Calcium: 8.3 mg/dL — ABNORMAL LOW (ref 8.9–10.3)
Chloride: 106 mmol/L (ref 98–111)
Creatinine, Ser: 0.8 mg/dL (ref 0.44–1.00)
GFR, Estimated: >60 mL/min (ref >60)
Glucose, Bld: 84 mg/dL (ref 70–99)
Potassium: 3.7 mmol/L (ref 3.5–5.1)
Sodium: 138 mmol/L (ref 135–145)

## 2020-08-18 LAB — GLUCOSE, CAPILLARY
Glucose-Capillary: 112 mg/dL — ABNORMAL HIGH (ref 70–99)
Glucose-Capillary: 89 mg/dL (ref 70–99)
Glucose-Capillary: 95 mg/dL (ref 70–99)
Glucose-Capillary: 97 mg/dL (ref 70–99)

## 2020-08-18 MED ORDER — TRAMADOL HCL 50 MG PO TABS
50.0000 mg | ORAL_TABLET | Freq: Four times a day (QID) | ORAL | Status: DC | PRN
  Administered 2020-08-20 – 2020-08-23 (×5): 50 mg via ORAL
  Filled 2020-08-18 (×5): qty 1

## 2020-08-18 MED ORDER — ACETAMINOPHEN 325 MG PO TABS
650.0000 mg | ORAL_TABLET | Freq: Four times a day (QID) | ORAL | Status: DC | PRN
  Administered 2020-08-19 – 2020-08-24 (×7): 650 mg via ORAL
  Filled 2020-08-18 (×7): qty 2

## 2020-08-18 MED ORDER — ASPIRIN EC 325 MG PO TBEC
325.0000 mg | DELAYED_RELEASE_TABLET | Freq: Every day | ORAL | Status: DC
  Administered 2020-08-19 – 2020-08-22 (×4): 325 mg via ORAL
  Filled 2020-08-18 (×4): qty 1

## 2020-08-18 MED ORDER — SODIUM CHLORIDE 0.9% FLUSH: 3.0000 mL | INTRAVENOUS | Status: DC | PRN

## 2020-08-18 MED ORDER — FUROSEMIDE 40 MG PO TABS
40.0000 mg | ORAL_TABLET | Freq: Every day | ORAL | Status: AC
  Administered 2020-08-18 – 2020-08-19 (×2): 40 mg via ORAL
  Filled 2020-08-18 (×2): qty 1

## 2020-08-18 MED ORDER — SODIUM CHLORIDE 0.9 % IV SOLN: 250.0000 mL | INTRAVENOUS | Status: DC | PRN

## 2020-08-18 MED ORDER — OXYCODONE HCL 5 MG PO TABS: 5.0000 mg | ORAL_TABLET | ORAL | Status: DC | PRN

## 2020-08-18 MED ORDER — MIDODRINE HCL 5 MG PO TABS
5.0000 mg | ORAL_TABLET | Freq: Three times a day (TID) | ORAL | Status: DC
  Administered 2020-08-18: 5 mg via ORAL
  Filled 2020-08-18: qty 1

## 2020-08-18 MED ORDER — PANTOPRAZOLE SODIUM 40 MG PO TBEC
40.0000 mg | DELAYED_RELEASE_TABLET | Freq: Every day | ORAL | Status: DC
  Administered 2020-08-19 – 2020-08-24 (×6): 40 mg via ORAL
  Filled 2020-08-18 (×6): qty 1

## 2020-08-18 MED ORDER — ~~LOC~~ CARDIAC SURGERY, PATIENT & FAMILY EDUCATION: Freq: Once | Status: DC

## 2020-08-18 MED ORDER — SENNOSIDES-DOCUSATE SODIUM 8.6-50 MG PO TABS: 1.0000 | ORAL_TABLET | Freq: Two times a day (BID) | ORAL | Status: DC | PRN

## 2020-08-18 MED ORDER — SODIUM CHLORIDE 0.9% FLUSH
3.0000 mL | Freq: Two times a day (BID) | INTRAVENOUS | Status: DC
  Administered 2020-08-18 – 2020-08-24 (×11): 3 mL via INTRAVENOUS

## 2020-08-18 MED ORDER — POTASSIUM CHLORIDE CRYS ER 20 MEQ PO TBCR
20.0000 meq | EXTENDED_RELEASE_TABLET | Freq: Three times a day (TID) | ORAL | Status: DC
  Administered 2020-08-18 (×3): 20 meq via ORAL
  Filled 2020-08-18 (×3): qty 1

## 2020-08-18 MED ORDER — ONDANSETRON HCL 4 MG PO TABS: 4.0000 mg | ORAL_TABLET | Freq: Four times a day (QID) | ORAL | Status: DC | PRN

## 2020-08-18 MED ORDER — ONDANSETRON HCL 4 MG/2ML IJ SOLN
4.0000 mg | Freq: Four times a day (QID) | INTRAMUSCULAR | Status: DC | PRN
  Administered 2020-08-23: 4 mg via INTRAVENOUS
  Filled 2020-08-18: qty 2

## 2020-08-18 MED FILL — Heparin Sodium (Porcine) Inj 1000 Unit/ML: INTRAMUSCULAR | Qty: 10 | Status: AC

## 2020-08-18 MED FILL — Potassium Chloride Inj 2 mEq/ML: INTRAVENOUS | Qty: 40 | Status: AC

## 2020-08-18 MED FILL — Sodium Bicarbonate IV Soln 8.4%: INTRAVENOUS | Qty: 50 | Status: AC

## 2020-08-18 MED FILL — Mannitol IV Soln 20%: INTRAVENOUS | Qty: 500 | Status: AC

## 2020-08-18 MED FILL — Electrolyte-R (PH 7.4) Solution: INTRAVENOUS | Qty: 4000 | Status: AC

## 2020-08-18 MED FILL — Magnesium Sulfate Inj 50%: INTRAMUSCULAR | Qty: 10 | Status: AC

## 2020-08-18 MED FILL — Lidocaine HCl Local Soln Prefilled Syringe 100 MG/5ML (2%): INTRAMUSCULAR | Qty: 5 | Status: AC

## 2020-08-18 MED FILL — Heparin Sodium (Porcine) Inj 1000 Unit/ML: INTRAMUSCULAR | Qty: 30 | Status: AC

## 2020-08-18 MED FILL — Heparin Sodium (Porcine) Inj 1000 Unit/ML: INTRAMUSCULAR | Qty: 2500 | Status: AC

## 2020-08-18 MED FILL — Sodium Chloride IV Soln 0.9%: INTRAVENOUS | Qty: 2000 | Status: AC

## 2020-08-18 NOTE — Progress Notes (Signed)
Mobility Specialist: Progress Note   08/18/20 1710  Mobility  Activity Ambulated in hall  Level of Assistance Independent  Assistive Device None  Distance Ambulated (ft) 470 ft  Mobility Response Tolerated well  Mobility performed by Mobility specialist  $Mobility charge 1 Mobility   Pre-Mobility: 77 HR, 96% SpO2 Post-Mobility: 90 HR, 144/61 BP, 97% SpO2  Pt asx during ambulation. Pt noted she has previously had bilateral knee pain but said her knees didn't bother her today. Pt back to bed after walk.   [facility] Tahjay Binion Mobility Specialist

## 2020-08-18 NOTE — Progress Notes (Signed)
Pt arrived from unit from 2 heart . CABG x4  VSS.Will continue to monitor. Pt. Oriented to unit. CHG given, pacing wires taped.   Everlean Cherry, RN

## 2020-08-18 NOTE — Progress Notes (Signed)
      [address]       [place] [address]             [phone]     BP elevated this afternoon 153/61. Will decrease midodrine to 5mg  TID and monitor closely.    , PA-C

## 2020-08-18 NOTE — Progress Notes (Signed)
3 Days Post-Op Procedure(s) (LRB): CORONARY ARTERY BYPASS GRAFTING (CABG)X4. USING BILATERAL MAMMARY ARTERIES AND RIGHT GREATER SAPHENOUS VEIN HARVESTED ENDOSCOPICALLY. (N/A) TRANSESOPHAGEAL ECHOCARDIOGRAM (TEE) (N/A) Subjective:  No complaints. Walked this am. Doing 1750 on IS  Objective: Vital signs in last 24 hours: Temp:  [97.5 F (36.4 C)-98.6 F (37 C)] 98.2 F (36.8 C) (01/20 0737) Pulse Rate:  [49-90] 70 (01/20 0600) Cardiac Rhythm: Normal sinus rhythm (01/19 1600) Resp:  [12-22] 12 (01/20 0500) BP: (77-143)/(25-107) 104/47 (01/20 0600) SpO2:  [82 %-99 %] 97 % (01/20 0600) Weight:  [74.7 kg] 74.7 kg (01/20 0500)  Hemodynamic parameters for last 24 hours:    Intake/Output from previous day: 01/19 0701 - 01/20 0700 In: 280 [P.O.:120; I.V.:59.9; IV Piggyback:100.1] Out: 300 [Urine:300] Intake/Output this shift: No intake/output data recorded.  General appearance: alert and cooperative Neurologic: intact Heart: regular rate and rhythm, S1, S2 normal, no murmur, click, rub or gallop Lungs: clear to auscultation bilaterally Extremities: edema mild Wound: incisions ok  Lab Results: Recent Labs    08/16/20 1700 08/17/20 0422  WBC 11.4* 12.3*  HGB 9.2* 8.7*  HCT 26.6* 26.8*  PLT 176 168   BMET:  Recent Labs    08/17/20 0422 08/18/20 0526  NA 138 138  K 3.7 3.7  CL 108 106  CO2 23 24  GLUCOSE 95 84  BUN 19 20  CREATININE 0.79 0.80  CALCIUM 8.4* 8.3*    PT/INR:  Recent Labs    08/15/20 1422  LABPROT 16.5*  INR 1.4*   ABG    Component Value Date/Time   PHART 7.346 (L) 08/15/2020 2149   HCO3 22.0 08/15/2020 2149   TCO2 23 08/15/2020 2149   ACIDBASEDEF 3.0 (H) 08/15/2020 2149   O2SAT 97.0 08/15/2020 2149   CBG (last 3)  Recent Labs    08/17/20 1924 08/18/20 0006 08/18/20 0735  GLUCAP 117* 95 112*    Assessment/Plan: S/P Procedure(s) (LRB): CORONARY ARTERY BYPASS GRAFTING (CABG)X4. USING BILATERAL MAMMARY ARTERIES AND RIGHT GREATER  SAPHENOUS VEIN HARVESTED ENDOSCOPICALLY. (N/A) TRANSESOPHAGEAL ECHOCARDIOGRAM (TEE) (N/A)  POD 3  Hemodynamically stable off neo. Continue midodrine 10 tid today and may wean tomorrow if BP stable. Holding off on beta blocker for now.  Volume excess: wt is down 2 lbs from yesterday and 5 lbs over preop. Continue diuretic and kdur.  DC sleeve  IS, ambulation  Transfer to 4E.   Anticipate home Saturday.     LOS: 3 days    Alleen Borne 08/18/2020

## 2020-08-19 LAB — BASIC METABOLIC PANEL
Anion gap: 9 (ref 5–15)
BUN: 21 mg/dL (ref 8–23)
CO2: 23 mmol/L (ref 22–32)
Calcium: 8.4 mg/dL — ABNORMAL LOW (ref 8.9–10.3)
Chloride: 106 mmol/L (ref 98–111)
Creatinine, Ser: 0.8 mg/dL (ref 0.44–1.00)
GFR, Estimated: >60 mL/min (ref >60)
Glucose, Bld: 86 mg/dL (ref 70–99)
Potassium: 4.2 mmol/L (ref 3.5–5.1)
Sodium: 138 mmol/L (ref 135–145)

## 2020-08-19 MED ORDER — POTASSIUM CHLORIDE CRYS ER 20 MEQ PO TBCR
20.0000 meq | EXTENDED_RELEASE_TABLET | Freq: Two times a day (BID) | ORAL | Status: AC
  Administered 2020-08-19 (×2): 20 meq via ORAL
  Filled 2020-08-19 (×2): qty 1

## 2020-08-19 MED ORDER — MIDODRINE HCL 5 MG PO TABS
5.0000 mg | ORAL_TABLET | Freq: Two times a day (BID) | ORAL | Status: DC
  Administered 2020-08-19 – 2020-08-21 (×5): 5 mg via ORAL
  Filled 2020-08-19 (×5): qty 1

## 2020-08-19 NOTE — Progress Notes (Signed)
CARDIAC REHAB PHASE I   PRE:  Rate/Rhythm: 82 SR with PACs    BP: sitting 119/54    SaO2: 97 RA  MODE:  Ambulation: 430 ft   POST:  Rate/Rhythm: 102 ST with PACs    BP: sitting 136/55     SaO2: 96 RA  Pt moving well. Steady with standby assist. DOE with distance and rest x1. To recliner, VSS. Discussed IS, sternal precautions, diet, exercise, and CRPII. Pt and husband voiced understanding and requests her name be sent to [facility] CRPII.  [phone]  Debbie Cardenas CES, ACSM 08/19/2020 12:03 PM

## 2020-08-19 NOTE — Progress Notes (Addendum)
Epicardial pacing wires removed as per orders. Pt tolerated well.  Bed rest until 1020.  BP 130s/70s, midodrine 5mg  po due. , PA notified and advised to administer as ordered.

## 2020-08-19 NOTE — Progress Notes (Signed)
Mobility Specialist: Progress Note   08/19/20 1409  Mobility  Activity Ambulated in hall  Level of Assistance Independent  Assistive Device None  Distance Ambulated (ft) 580 ft  Mobility Response Tolerated well  Mobility performed by Mobility specialist  $Mobility charge 1 Mobility   Pre-Mobility: 82 HR, 98% SpO2 During Mobility: 91 HR, 96% SpO2 Post-Mobility: 102 HR, 95% SpO2  Pt visibly SOB during ambulation and stopped to take one standing break due to feeling SOB at 290'. Pt back to bed after walk.   [facility] Tyyne Cliett Mobility Specialist

## 2020-08-19 NOTE — Progress Notes (Signed)
      [address]       [place] [address]             [phone]      4 Days Post-Op Procedure(s) (LRB): CORONARY ARTERY BYPASS GRAFTING (CABG)X4. USING BILATERAL MAMMARY ARTERIES AND RIGHT GREATER SAPHENOUS VEIN HARVESTED ENDOSCOPICALLY. (N/A) TRANSESOPHAGEAL ECHOCARDIOGRAM (TEE) (N/A) Subjective: No complaints. Slept well. Ambulating well.  Objective: Vital signs in last 24 hours: Temp:  [98.1 F (36.7 C)-98.5 F (36.9 C)] 98.1 F (36.7 C) (01/21 0550) Pulse Rate:  [54-147] 84 (01/21 0550) Cardiac Rhythm: Normal sinus rhythm (01/21 0550) Resp:  [15-24] 21 (01/21 0550) BP: (101-153)/(48-67) 131/48 (01/21 0550) SpO2:  [91 %-98 %] 96 % (01/21 0550) Weight:  [74.3 kg] 74.3 kg (01/21 0550)    Intake/Output from previous day: 01/20 0701 - 01/21 0700 In: 720 [P.O.:720] Out: -  Intake/Output this shift: No intake/output data recorded.  General appearance: alert and cooperative Neurologic: intact Heart: regular rate and rhythm, S1, S2 normal, no murmur Lungs: clear to auscultation bilaterally Extremities: edema trivial Wound: incisions ok  Lab Results: Recent Labs    08/16/20 1700 08/17/20 0422  WBC 11.4* 12.3*  HGB 9.2* 8.7*  HCT 26.6* 26.8*  PLT 176 168   BMET:  Recent Labs    08/18/20 0526 08/19/20 0139  NA 138 138  K 3.7 4.2  CL 106 106  CO2 24 23  GLUCOSE 84 86  BUN 20 21  CREATININE 0.80 0.80  CALCIUM 8.3* 8.4*    PT/INR: No results for input(s): LABPROT, INR in the last 72 hours. ABG    Component Value Date/Time   PHART 7.346 (L) 08/15/2020 2149   HCO3 22.0 08/15/2020 2149   TCO2 23 08/15/2020 2149   ACIDBASEDEF 3.0 (H) 08/15/2020 2149   O2SAT 97.0 08/15/2020 2149   CBG (last 3)  Recent Labs    08/18/20 0735 08/18/20 1105 08/18/20 1606  GLUCAP 112* 97 89    Assessment/Plan: S/P Procedure(s) (LRB): CORONARY ARTERY BYPASS GRAFTING (CABG)X4. USING BILATERAL MAMMARY ARTERIES AND RIGHT GREATER SAPHENOUS VEIN  HARVESTED ENDOSCOPICALLY. (N/A) TRANSESOPHAGEAL ECHOCARDIOGRAM (TEE) (N/A)  1. CV-BP well controlled. Midodrine decreased yesterday. NSR in the 80s 2. Pulm-tolerating room air with good oxygen support. No recent CXR. Small left pleural effusion and persistent bibasilar atelectasis.  3. Renal-creatinine 0.80, electrolyte okay 4. H and H 8.7/26.8, acute blood loss anemia 5. Endo-blood glucose well controlled  Plan:   Will decrease midodrine to 5 bid and if pressure ok will dc at discharge.  Continue diuresis and K+ replacement. May need to go home on a few days of lasix and Kdur if wt still above preop.  DC pacing wires today.  Plan home tomorrow if no changes.  LOS: 4 days    Sharlene Dory 08/19/2020

## 2020-08-19 NOTE — Care Management Important Message (Signed)
Important Message  Patient Details  Name: Debbie Cardenas MRN: [id number] Date of Birth: 01-26-1946   Medicare Important Message Given:  Yes     Renie Ora 08/19/2020, 10:50 AM

## 2020-08-20 MED ORDER — AMIODARONE HCL 200 MG PO TABS
400.0000 mg | ORAL_TABLET | Freq: Two times a day (BID) | ORAL | Status: DC
  Administered 2020-08-20 – 2020-08-22 (×6): 400 mg via ORAL
  Filled 2020-08-20 (×6): qty 2

## 2020-08-20 MED ORDER — AMIODARONE IV BOLUS ONLY 150 MG/100ML
150.0000 mg | Freq: Once | INTRAVENOUS | Status: AC
  Administered 2020-08-20: 150 mg via INTRAVENOUS
  Filled 2020-08-20: qty 100

## 2020-08-20 NOTE — Progress Notes (Addendum)
      [address]       [place] [address]             [phone]      5 Days Post-Op Procedure(s) (LRB): CORONARY ARTERY BYPASS GRAFTING (CABG)X4. USING BILATERAL MAMMARY ARTERIES AND RIGHT GREATER SAPHENOUS VEIN HARVESTED ENDOSCOPICALLY. (N/A) TRANSESOPHAGEAL ECHOCARDIOGRAM (TEE) (N/A) Subjective: Did not rest well last night but feels OK this morning.  Doing well with mobility and remains on RA with good sats.   Developed atrial fibrillation this morning with VR 90's-140's.  SBP currently 104.  Objective: Vital signs in last 24 hours: Temp:  [98 F (36.7 C)-99.3 F (37.4 C)] 98.4 F (36.9 C) (01/22 0755) Pulse Rate:  [74-84] 80 (01/22 0755) Cardiac Rhythm: Normal sinus rhythm;Bundle branch block (01/22 0807) Resp:  [11-21] 16 (01/22 0755) BP: (104-141)/(50-78) 104/50 (01/22 0755) SpO2:  [93 %-100 %] 98 % (01/22 0755)    Intake/Output from previous day: 01/21 0701 - 01/22 0700 In: 360 [P.O.:360] Out: -  Intake/Output this shift: No intake/output data recorded.  General appearance: alert and cooperative Neurologic: intact Heart: irregularly irregular Lungs: clear to auscultation bilaterally Extremities: edema minimal Wound: incisions dry and intact.   Lab Results: No results for input(s): WBC, HGB, HCT, PLT in the last 72 hours. BMET:  Recent Labs    08/18/20 0526 08/19/20 0139  NA 138 138  K 3.7 4.2  CL 106 106  CO2 24 23  GLUCOSE 84 86  BUN 20 21  CREATININE 0.80 0.80  CALCIUM 8.3* 8.4*    PT/INR: No results for input(s): LABPROT, INR in the last 72 hours. ABG    Component Value Date/Time   PHART 7.346 (L) 08/15/2020 2149   HCO3 22.0 08/15/2020 2149   TCO2 23 08/15/2020 2149   ACIDBASEDEF 3.0 (H) 08/15/2020 2149   O2SAT 97.0 08/15/2020 2149   CBG (last 3)  Recent Labs    08/18/20 0735 08/18/20 1105 08/18/20 1606  GLUCAP 112* 97 89    Assessment/Plan: S/P Procedure(s) (LRB): CORONARY ARTERY BYPASS GRAFTING (CABG)X4.  USING BILATERAL MAMMARY ARTERIES AND RIGHT GREATER SAPHENOUS VEIN HARVESTED ENDOSCOPICALLY. (N/A) TRANSESOPHAGEAL ECHOCARDIOGRAM (TEE) (N/A)  1. CV-BP well controlled. Midodrine at 5mg  BID. New Afib in the 90's-140. 2. Pulm-tolerating room air with good oxygen sats 3. Renal-creatinine 0.80, electrolyte okay yesterday. Recheck in am 4. H and H 8.7/26.8, acute blood loss anemia 5. Endo-blood glucose well controlled  Plan: Only has small peripheral IV so will give amiodarone 150mg  bolus x 1 and initiate PO amio load for afib.     LOS: 5 days    07-02-1974, PA-C 08/20/2020 Patient seen and examined, agree with above Went into A fib with controlled V response this AM Currently intermittent SR with PACs and A fib- started on amiodarone  Abriana Saltos C. Leary Roca, MD [facility] [phone] '

## 2020-08-20 NOTE — Progress Notes (Signed)
CARDIAC REHAB PHASE I   PRE:  Rate/Rhythm: Sinus Rhythm  PAC's 79  BP:    Sitting: 121/57     SaO2: 96% RA  MODE:  Ambulation: 380 ft   POST:  Rate/Rhythem: Sinus 94  BP:    Sitting: 121/57     SaO2: 96% RA [id number] Patient ambulated in hallway with assist times one. Stopped to rest briefly. Two short 4-5 beat burst of Atrial fib noted prior to ambulating in the hallway. Patient assisted back to recliner with call bell within reach. Feet elevated. Patient has been using incentive spirometer.   Thayer Headings RN BSN

## 2020-08-20 NOTE — Progress Notes (Signed)
Mobility Specialist - Progress Note   08/20/20 1307  Mobility  Activity Ambulated in hall  Level of Assistance Standby assist, set-up cues, supervision of patient - no hands on  Assistive Device None  Distance Ambulated (ft) 470 ft  Mobility Response Tolerated well  Mobility performed by Mobility specialist  $Mobility charge 1 Mobility   Pre-mobility: 104 HR During mobility: 130 HR Post-mobility: 95 HR  Asx throughout ambulation. Pt back in room with family member after walk.   Mamie Levers Mobility Specialist Mobility Specialist Phone: [phone]

## 2020-08-21 LAB — BASIC METABOLIC PANEL
Anion gap: 9 (ref 5–15)
BUN: 15 mg/dL (ref 8–23)
CO2: 24 mmol/L (ref 22–32)
Calcium: 8.8 mg/dL — ABNORMAL LOW (ref 8.9–10.3)
Chloride: 107 mmol/L (ref 98–111)
Creatinine, Ser: 0.78 mg/dL (ref 0.44–1.00)
GFR, Estimated: >60 mL/min (ref >60)
Glucose, Bld: 100 mg/dL — ABNORMAL HIGH (ref 70–99)
Potassium: 3.5 mmol/L (ref 3.5–5.1)
Sodium: 140 mmol/L (ref 135–145)

## 2020-08-21 LAB — MAGNESIUM: Magnesium: 1.7 mg/dL (ref 1.7–2.4)

## 2020-08-21 MED ORDER — MAGNESIUM SULFATE 2 GM/50ML IV SOLN
2.0000 g | Freq: Once | INTRAVENOUS | Status: AC
  Administered 2020-08-21: 2 g via INTRAVENOUS
  Filled 2020-08-21: qty 50

## 2020-08-21 MED ORDER — POTASSIUM CHLORIDE CRYS ER 20 MEQ PO TBCR
20.0000 meq | EXTENDED_RELEASE_TABLET | ORAL | Status: AC
Start: 2020-08-21 — End: 2020-08-21
  Administered 2020-08-21 (×3): 20 meq via ORAL
  Filled 2020-08-21 (×3): qty 1

## 2020-08-21 MED ORDER — POTASSIUM CHLORIDE CRYS ER 10 MEQ PO TBCR: 20.0000 meq | EXTENDED_RELEASE_TABLET | Freq: Every day | ORAL | Status: DC

## 2020-08-21 MED ORDER — FUROSEMIDE 40 MG PO TABS
40.0000 mg | ORAL_TABLET | Freq: Every day | ORAL | Status: DC
  Administered 2020-08-21 – 2020-08-22 (×2): 40 mg via ORAL
  Filled 2020-08-21 (×2): qty 1

## 2020-08-21 MED ORDER — MAGNESIUM OXIDE 400 (241.3 MG) MG PO TABS
400.0000 mg | ORAL_TABLET | Freq: Every day | ORAL | Status: DC
  Administered 2020-08-21 – 2020-08-24 (×4): 400 mg via ORAL
  Filled 2020-08-21 (×4): qty 1

## 2020-08-21 MED ORDER — MIDODRINE HCL 5 MG PO TABS
2.5000 mg | ORAL_TABLET | Freq: Two times a day (BID) | ORAL | Status: DC
  Administered 2020-08-21: 2.5 mg via ORAL
  Filled 2020-08-21: qty 1

## 2020-08-21 NOTE — Progress Notes (Addendum)
      [address]       [place] [address]             [phone]      6 Days Post-Op Procedure(s) (LRB): CORONARY ARTERY BYPASS GRAFTING (CABG)X4. USING BILATERAL MAMMARY ARTERIES AND RIGHT GREATER SAPHENOUS VEIN HARVESTED ENDOSCOPICALLY. (N/A) TRANSESOPHAGEAL ECHOCARDIOGRAM (TEE) (N/A) Subjective:  Awake and alert and generally feeling OK. Notices palpitations when she is in atrial fibrillation.  Doing well with mobility and remains on RA with good sats.    Objective: Vital signs in last 24 hours: Temp:  [98.1 F (36.7 C)-98.9 F (37.2 C)] 98.1 F (36.7 C) (01/23 0731) Pulse Rate:  [60-82] 67 (01/23 0731) Cardiac Rhythm: Normal sinus rhythm (01/22 2100) Resp:  [15-20] 16 (01/23 0731) BP: (123-149)/(46-63) 138/52 (01/23 0731) SpO2:  [94 %-98 %] 97 % (01/23 0731) Weight:  [74.2 kg] 74.2 kg (01/23 0500)    Intake/Output from previous day: 01/22 0701 - 01/23 0700 In: -  Out: 300 [Urine:300] Intake/Output this shift: No intake/output data recorded.  General appearance: alert and cooperative Neurologic: intact Heart: currently in SR but has been in and out of a-fib several times over the past several hours.  Lungs: clear to auscultation bilaterally Extremities: edema minimal Wound: incisions dry and intact.   Lab Results: No results for input(s): WBC, HGB, HCT, PLT in the last 72 hours. BMET:  Recent Labs    08/19/20 0139 08/21/20 0141  NA 138 140  K 4.2 3.5  CL 106 107  CO2 23 24  GLUCOSE 86 100*  BUN 21 15  CREATININE 0.80 0.78  CALCIUM 8.4* 8.8*    PT/INR: No results for input(s): LABPROT, INR in the last 72 hours. ABG    Component Value Date/Time   PHART 7.346 (L) 08/15/2020 2149   HCO3 22.0 08/15/2020 2149   TCO2 23 08/15/2020 2149   ACIDBASEDEF 3.0 (H) 08/15/2020 2149   O2SAT 97.0 08/15/2020 2149   CBG (last 3)  Recent Labs    08/18/20 1105 08/18/20 1606  GLUCAP 97 89    Assessment/Plan: S/P Procedure(s)  (LRB): CORONARY ARTERY BYPASS GRAFTING (CABG)X4. USING BILATERAL MAMMARY ARTERIES AND RIGHT GREATER SAPHENOUS VEIN HARVESTED ENDOSCOPICALLY. (N/A) TRANSESOPHAGEAL ECHOCARDIOGRAM (TEE) (N/A)  1. CV-BP 120-'s to 140's.  Decrease Midodrine to 2.5mg  BID. New onset Afib yesterday morning. bolused with amiodarone 150mg  x 1 and started on oral amio. Continues to have PAF with controlled VR. Continue amio loading and correct K+ and Mg++ today. Need to consider anticoagulation.  2. Pulm-tolerating room air with good oxygen sats 3. Renal-creatinine 0.78. Wt still about 3.5 lbs positive. Continue oral Lasix daily.  4. H and H 8.7/26.8, acute blood loss anemia. Recheck in AM.  5. Endo-blood glucose well controlled    LOS: 6 days    07-02-1974, PA-C [phone] 08/21/2020 Patient seen and examined, agree with above  08/23/2020 C. Viviann Spare, MD [facility] [phone]

## 2020-08-21 NOTE — Progress Notes (Signed)
Mobility Specialist - Progress Note   08/21/20 1124  Mobility  Activity Contraindicated/medical hold   Pt's HR is currently intermittently tachycardic while at rest, instructed to hold mobility by RN.   Mamie Levers Mobility Specialist Mobility Specialist Phone: [phone]

## 2020-08-21 NOTE — Progress Notes (Signed)
Patient going in and out of SR and Afib RVR up to 150s. Notified PA. No new orders at this time.  Sabra Heck, RN

## 2020-08-22 LAB — BASIC METABOLIC PANEL
Anion gap: 9 (ref 5–15)
BUN: 12 mg/dL (ref 8–23)
CO2: 26 mmol/L (ref 22–32)
Calcium: 8.7 mg/dL — ABNORMAL LOW (ref 8.9–10.3)
Chloride: 104 mmol/L (ref 98–111)
Creatinine, Ser: 0.79 mg/dL (ref 0.44–1.00)
GFR, Estimated: >60 mL/min (ref >60)
Glucose, Bld: 96 mg/dL (ref 70–99)
Potassium: 3.7 mmol/L (ref 3.5–5.1)
Sodium: 139 mmol/L (ref 135–145)

## 2020-08-22 LAB — CBC
HCT: 23.8 % — ABNORMAL LOW (ref 36.0–46.0)
Hemoglobin: 8.1 g/dL — ABNORMAL LOW (ref 12.0–15.0)
MCH: 31.5 pg (ref 26.0–34.0)
MCHC: 34 g/dL (ref 30.0–36.0)
MCV: 92.6 fL (ref 80.0–100.0)
Platelets: 302 10*3/uL (ref 150–400)
RBC: 2.57 MIL/uL — ABNORMAL LOW (ref 3.87–5.11)
RDW: 13.7 % (ref 11.5–15.5)
WBC: 6.7 10*3/uL (ref 4.0–10.5)
nRBC: 0 % (ref 0.0–0.2)

## 2020-08-22 LAB — MAGNESIUM: Magnesium: 2 mg/dL (ref 1.7–2.4)

## 2020-08-22 MED ORDER — ASPIRIN EC 81 MG PO TBEC
81.0000 mg | DELAYED_RELEASE_TABLET | Freq: Every day | ORAL | Status: DC
  Administered 2020-08-23 – 2020-08-24 (×2): 81 mg via ORAL
  Filled 2020-08-22 (×2): qty 1

## 2020-08-22 MED ORDER — POTASSIUM CHLORIDE CRYS ER 20 MEQ PO TBCR
40.0000 meq | EXTENDED_RELEASE_TABLET | Freq: Every day | ORAL | Status: AC
  Administered 2020-08-22 – 2020-08-23 (×2): 40 meq via ORAL
  Filled 2020-08-22 (×2): qty 2

## 2020-08-22 MED ORDER — APIXABAN 5 MG PO TABS
5.0000 mg | ORAL_TABLET | Freq: Two times a day (BID) | ORAL | Status: DC
  Administered 2020-08-22 – 2020-08-24 (×5): 5 mg via ORAL
  Filled 2020-08-22 (×4): qty 1

## 2020-08-22 MED ORDER — FE FUMARATE-B12-VIT C-FA-IFC PO CAPS
1.0000 | ORAL_CAPSULE | Freq: Two times a day (BID) | ORAL | Status: DC
  Administered 2020-08-22 – 2020-08-24 (×5): 1 via ORAL
  Filled 2020-08-22 (×4): qty 1

## 2020-08-22 NOTE — Progress Notes (Signed)
Mobility Specialist - Progress Note   08/22/20 1112  Mobility  Activity Ambulated in hall  Level of Assistance Standby assist, set-up cues, supervision of patient - no hands on  Assistive Device None  Distance Ambulated (ft) 350 ft  Mobility Response Tolerated well  Mobility performed by Mobility specialist  $Mobility charge 1 Mobility   Distance limited from fatigue while ambulating, otherwise asx. Her HR remained in 80s throughout.   Debbie Cardenas Mobility Specialist Mobility Specialist Phone: [phone]

## 2020-08-22 NOTE — Progress Notes (Signed)
CARDIAC REHAB PHASE I   PRE:  Rate/Rhythm: 62 SR  BP:  Supine:   Sitting: 125/46  Standing:    SaO2: 96%RA  MODE:  Ambulation: 620 ft   POST:  Rate/Rhythm: 87 SR  BP:  Supine:   Sitting: 134/64  Standing:    SaO2: 97%RA 0938-1013 Pt walked 620 ft on RA with steady gait. Stopped to rest a couple of times. Slight bleeding from right leg incision as legs brushed each other as she walked. Cleaned area and wiped with betadine. No dressing needed. To recliner after walk. Encouraged three walks today.   Luetta Nutting, RN BSN  08/22/2020 10:14 AM

## 2020-08-22 NOTE — Care Management Important Message (Signed)
Important Message  Patient Details  Name: Debbie Cardenas MRN: [id number] Date of Birth: 1946-05-29   Medicare Important Message Given:  Yes     Renie Ora 08/22/2020, 1:44 PM

## 2020-08-22 NOTE — TOC Benefit Eligibility Note (Signed)
Transition of Care [facility]) Benefit Eligibility Note    Patient Details  Name: Debbie Cardenas MRN: [id number] Date of Birth: Nov 11, 1945   Medication/Dose: Arne Cleveland  5 MG BID  Covered?: Yes  Tier: 3 Drug  Prescription Coverage [facility]erred [facility]: [facility]  Spoke with Person/Company/Phone Number:: JOSEL   @ [employer] # [phone]  Co-Pay: $47.00  Prior Approval: No  Deductible: Met       Memory Argue Phone Number: 08/22/2020, 10:56 AM

## 2020-08-22 NOTE — Progress Notes (Addendum)
[address]       [place] [address]             [phone]      7 Days Post-Op Procedure(s) (LRB): CORONARY ARTERY BYPASS GRAFTING (CABG)X4. USING BILATERAL MAMMARY ARTERIES AND RIGHT GREATER SAPHENOUS VEIN HARVESTED ENDOSCOPICALLY. (N/A) TRANSESOPHAGEAL ECHOCARDIOGRAM (TEE) (N/A) Subjective: Generally feels well  Objective: Vital signs in last 24 hours: Temp:  [98 F (36.7 C)-98.5 F (36.9 C)] 98.1 F (36.7 C) (01/24 0656) Pulse Rate:  [62-68] 65 (01/24 0656) Cardiac Rhythm: Normal sinus rhythm (01/23 2000) Resp:  [16-20] 17 (01/24 0656) BP: (97-138)/(49-55) 97/49 (01/24 0656) SpO2:  [94 %-97 %] 95 % (01/24 0656)  Hemodynamic parameters for last 24 hours:    Intake/Output from previous day: 01/23 0701 - 01/24 0700 In: 483 [P.O.:480; I.V.:3] Out: -  Intake/Output this shift: No intake/output data recorded.  General appearance: alert, cooperative and no distress Heart: irregularly irregular rhythm Lungs: clear to auscultation bilaterally Abdomen: benign Extremities: no edema Wound: incis healing without signs of infection  Lab Results: Recent Labs    08/22/20 0125  WBC 6.7  HGB 8.1*  HCT 23.8*  PLT 302   BMET:  Recent Labs    08/21/20 0141 08/22/20 0125  NA 140 139  K 3.5 3.7  CL 107 104  CO2 24 26  GLUCOSE 100* 96  BUN 15 12  CREATININE 0.78 0.79  CALCIUM 8.8* 8.7*    PT/INR: No results for input(s): LABPROT, INR in the last 72 hours. ABG    Component Value Date/Time   PHART 7.346 (L) 08/15/2020 2149   HCO3 22.0 08/15/2020 2149   TCO2 23 08/15/2020 2149   ACIDBASEDEF 3.0 (H) 08/15/2020 2149   O2SAT 97.0 08/15/2020 2149   CBG (last 3)  No results for input(s): GLUCAP in the last 72 hours.  Meds Scheduled Meds: . amiodarone  400 mg Oral BID  . aspirin EC  325 mg Oral Daily  . [place], Patient & Family Education   Does not apply Once  . furosemide  40 mg Oral Daily  . magnesium oxide  400  mg Oral Daily  . mouth rinse  15 mL Mouth Rinse BID  . midodrine  2.5 mg Oral BID WC  . pantoprazole  40 mg Oral QAC breakfast  . potassium chloride  20 mEq Oral Daily  . rosuvastatin  20 mg Oral QPM  . sodium chloride flush  3 mL Intravenous Q12H   Continuous Infusions: . sodium chloride     PRN Meds:.sodium chloride, acetaminophen, ondansetron **OR** ondansetron (ZOFRAN) IV, oxyCODONE, senna-docusate, sodium chloride flush, traMADol  Xrays No results found.  Assessment/Plan: S/P Procedure(s) (LRB): CORONARY ARTERY BYPASS GRAFTING (CABG)X4. USING BILATERAL MAMMARY ARTERIES AND RIGHT GREATER SAPHENOUS VEIN HARVESTED ENDOSCOPICALLY. (N/A) TRANSESOPHAGEAL ECHOCARDIOGRAM (TEE) (N/A)  1 afeb, VSS, SBP 97-138. Sinus rhythm , some afib with RVR, currently sinus rhythm with PAC's- cont amio- replace K+/Mg++- may need to consider ACRX 2 sats good on RA 3 expected ABLA- H/H  down some from previous, now 8.1/23.8- doesn't appear symptomatic. Does not appear volume overloaded currently 4 normal renal fxn 6 BS stable control- not a diabetic 7 cont pulm toilet/cardeac rehab- routine, overall doing well    LOS: 7 days    Rowe Clack PA-C Pager [phone] 08/22/2020   Chart reviewed, patient examined, agree with above. Still having intermittent atrial fib with RVR. Loading with oral amio. Have not used beta  blocker because she has been on midodrine to support BP postop. Will stop midodrine today since BP has been adequate on low dose midodrine.  Wt is 3.5 lbs over preop. Continuing lasix and Kdur. Will start on Eliquis for atrial fib.

## 2020-08-22 NOTE — Discharge Instructions (Signed)
Discharge Instructions:  1. You may shower, please wash incisions daily with soap and water and keep dry.  If you wish to cover wounds with dressing you may do so but please keep clean and change daily.  No tub baths or swimming until incisions have completely healed.  If your incisions become red or develop any drainage please call our office at [phone]  2. No Driving until cleared by Dr. Bartle's office and you are no longer using narcotic pain medications  3. Monitor your weight daily.. Please use the same scale and weigh at same time... If you gain 5-10 lbs in 48 hours with associated lower extremity swelling, please contact our office at [phone]  4. Fever of 101.5 for at least 24 hours with no source, please contact our office at [phone]  5. Activity- up as tolerated, please walk at least 3 times per day.  Avoid strenuous activity, no lifting, pushing, or pulling with your arms over 8-10 lbs for a minimum of 6 weeks  6. If any questions or concerns arise, please do not hesitate to contact our office at [phone]  ____________________________________________________________________________________________________________________________ Information on my medicine - ELIQUIS (apixaban)  This medication education was reviewed with me or my healthcare representative as part of my discharge preparation.  Why was Eliquis prescribed for you? Eliquis was prescribed for you to reduce the risk of a blood clot forming that can cause a stroke if you have a medical condition called atrial fibrillation (a type of irregular heartbeat).  What do You need to know about Eliquis ? Take your Eliquis TWICE DAILY - one tablet in the morning and one tablet in the evening with or without food. If you have difficulty swallowing the tablet whole please discuss with your pharmacist how to take the medication safely.  Take Eliquis exactly as prescribed by your doctor and DO NOT stop taking  Eliquis without talking to the doctor who prescribed the medication.  Stopping may increase your risk of developing a stroke.  Refill your prescription before you run out.  After discharge, you should have regular check-up appointments with your healthcare provider that is prescribing your Eliquis.  In the future your dose may need to be changed if your kidney function or weight changes by a significant amount or as you get older.  What do you do if you miss a dose? If you miss a dose, take it as soon as you remember on the same day and resume taking twice daily.  Do not take more than one dose of ELIQUIS at the same time to make up a missed dose.  Important Safety Information A possible side effect of Eliquis is bleeding. You should call your healthcare provider right away if you experience any of the following: Bleeding from an injury or your nose that does not stop. Unusual colored urine (red or dark brown) or unusual colored stools (red or black). Unusual bruising for unknown reasons. A serious fall or if you hit your head (even if there is no bleeding).  Some medicines may interact with Eliquis and might increase your risk of bleeding or clotting while on Eliquis. To help avoid this, consult your healthcare provider or pharmacist prior to using any new prescription or non-prescription medications, including herbals, vitamins, non-steroidal anti-inflammatory drugs (NSAIDs) and supplements.  This website has more information on Eliquis (apixaban): http://www.eliquis.com/eliquis/home  

## 2020-08-23 MED ORDER — METOPROLOL TARTRATE 12.5 MG HALF TABLET
12.5000 mg | ORAL_TABLET | Freq: Two times a day (BID) | ORAL | Status: DC
  Administered 2020-08-23 – 2020-08-24 (×3): 12.5 mg via ORAL
  Filled 2020-08-23 (×3): qty 1

## 2020-08-23 MED ORDER — AMIODARONE HCL 200 MG PO TABS
200.0000 mg | ORAL_TABLET | Freq: Two times a day (BID) | ORAL | Status: DC
  Administered 2020-08-23 – 2020-08-24 (×3): 200 mg via ORAL
  Filled 2020-08-23 (×3): qty 1

## 2020-08-23 NOTE — Progress Notes (Signed)
CARDIAC REHAB PHASE I   PRE:  Rate/Rhythm: 70 SR    BP: sitting 115/51    SaO2: 95 RA  MODE:  Ambulation: 600 ft   POST:  Rate/Rhythm: 97 SR with burst of Afib    BP: sitting 129/53     SaO2: 98 RA  Pt feeling well this am. Able to don her shoes and ambulate independently. Rest x1 for DOE. Pt had a 2-3 burst of afib while walking, asx. Resolved once back to room. To recliner, encouraged more walking and IS. [phone]   Harriet Masson CES, ACSM 08/23/2020 8:41 AM

## 2020-08-23 NOTE — Progress Notes (Addendum)
[address]       [place] [address]             [phone]      8 Days Post-Op Procedure(s) (LRB): CORONARY ARTERY BYPASS GRAFTING (CABG)X4. USING BILATERAL MAMMARY ARTERIES AND RIGHT GREATER SAPHENOUS VEIN HARVESTED ENDOSCOPICALLY. (N/A) TRANSESOPHAGEAL ECHOCARDIOGRAM (TEE) (N/A) Subjective: Feels well overall   Objective: Vital signs in last 24 hours: Temp:  [97.8 F (36.6 C)-98.5 F (36.9 C)] 98 F (36.7 C) (01/25 0550) Pulse Rate:  [66-83] 66 (01/25 0550) Cardiac Rhythm: Normal sinus rhythm (01/24 1940) Resp:  [14-20] 16 (01/25 0550) BP: (105-143)/(47-76) 127/47 (01/25 0550) SpO2:  [95 %-100 %] 95 % (01/25 0550) Weight:  [72.3 kg] 72.3 kg (01/25 0500)  Hemodynamic parameters for last 24 hours:    Intake/Output from previous day: 01/24 0701 - 01/25 0700 In: 243 [P.O.:240; I.V.:3] Out: -  Intake/Output this shift: No intake/output data recorded.  General appearance: alert, cooperative and no distress Heart: irregularly irregular rhythm Lungs: clear to auscultation bilaterally Abdomen: benign Extremities: no edema Wound: incis healing well  Lab Results: Recent Labs    08/22/20 0125  WBC 6.7  HGB 8.1*  HCT 23.8*  PLT 302   BMET:  Recent Labs    08/21/20 0141 08/22/20 0125  NA 140 139  K 3.5 3.7  CL 107 104  CO2 24 26  GLUCOSE 100* 96  BUN 15 12  CREATININE 0.78 0.79  CALCIUM 8.8* 8.7*    PT/INR: No results for input(s): LABPROT, INR in the last 72 hours. ABG    Component Value Date/Time   PHART 7.346 (L) 08/15/2020 2149   HCO3 22.0 08/15/2020 2149   TCO2 23 08/15/2020 2149   ACIDBASEDEF 3.0 (H) 08/15/2020 2149   O2SAT 97.0 08/15/2020 2149   CBG (last 3)  No results for input(s): GLUCAP in the last 72 hours.  Meds Scheduled Meds: . amiodarone  400 mg Oral BID  . apixaban  5 mg Oral BID  . aspirin EC  81 mg Oral Daily  . [facility], Patient & Family Education   Does not apply Once  .  ferrous fumarate-b12-vitamic C-folic acid  1 capsule Oral BID PC  . furosemide  40 mg Oral Daily  . magnesium oxide  400 mg Oral Daily  . mouth rinse  15 mL Mouth Rinse BID  . pantoprazole  40 mg Oral QAC breakfast  . potassium chloride  40 mEq Oral Daily  . rosuvastatin  20 mg Oral QPM  . sodium chloride flush  3 mL Intravenous Q12H   Continuous Infusions: . sodium chloride     PRN Meds:.sodium chloride, acetaminophen, ondansetron **OR** ondansetron (ZOFRAN) IV, oxyCODONE, senna-docusate, sodium chloride flush, traMADol  Xrays No results found.  Assessment/Plan: S/P Procedure(s) (LRB): CORONARY ARTERY BYPASS GRAFTING (CABG)X4. USING BILATERAL MAMMARY ARTERIES AND RIGHT GREATER SAPHENOUS VEIN HARVESTED ENDOSCOPICALLY. (N/A) TRANSESOPHAGEAL ECHOCARDIOGRAM (TEE) (N/A)  1 afeb, VSS, occas episodes of afib with RVR- she does feel palpitations and some nausea when this happens, now also on eliquis in addition to amio 2 sats good on RA 3 no new labs, on  Iron for anemia 4 good diuresis, will be able to stop lasix soon 5 cont rehab/pulm toilet  LOS: 8 days    Rowe Clack PA-C Pager [phone] 08/23/2020   Chart reviewed, patient examined, agree with above. Still having some short burst of atrial fib with RVR but converting on her own. Baseline  HR 60's to 70's in sinus. Will add Lopressor 12.5 bid and decrease amio to 200 bid. If BP stable will increase Lopressor further.

## 2020-08-23 NOTE — Progress Notes (Signed)
Mobility Specialist - Progress Note   08/23/20 1319  Mobility  Activity Ambulated in hall  Level of Assistance Standby assist, set-up cues, supervision of patient - no hands on  Assistive Device None  Distance Ambulated (ft) 240 ft  Mobility Response Tolerated fair  Mobility performed by Mobility specialist  $Mobility charge 1 Mobility   Pt required one seated rest break at 50 ft due to feeling like her legs were giving out. She says she has been having this sensation since last night. RN stated vitals were stable on the monitor at this time. Pt able to ambulate w/ chair follow to nurse station and back to recliner after this break. HR in 60s throughout.   Mamie Levers Mobility Specialist Mobility Specialist Phone: [phone]

## 2020-08-23 NOTE — Progress Notes (Signed)
Mobility Specialist - Progress Note   08/23/20 1832  Mobility  Activity Ambulated in hall  Level of Assistance Independent  Assistive Device None  Distance Ambulated (ft) 580 ft  Mobility Response Tolerated well  Mobility performed by Mobility specialist  $Mobility charge 1 Mobility   Pre-mobility: 67 HR During mobility: 82 HR Post-mobility: 67 HR  Pt stated she felt much better during this walk. Asx throughout. Pt sitting up on recliner w/ husband in room.   Mamie Levers Mobility Specialist Mobility Specialist Phone: [phone]

## 2020-08-24 ENCOUNTER — Ambulatory Visit: Payer: Medicare Other | Admitting: Cardiology

## 2020-08-24 ENCOUNTER — Other Ambulatory Visit (HOSPITAL_COMMUNITY): Payer: Self-pay | Admitting: Surgical

## 2020-08-24 MED ORDER — TRAMADOL HCL 50 MG PO TABS: 50.0000 mg | ORAL_TABLET | Freq: Four times a day (QID) | ORAL | 0 refills | Status: DC | PRN

## 2020-08-24 MED ORDER — APIXABAN 5 MG PO TABS: 5.0000 mg | ORAL_TABLET | Freq: Two times a day (BID) | ORAL | 1 refills | Status: DC

## 2020-08-24 MED ORDER — AMIODARONE HCL 200 MG PO TABS: 200.0000 mg | ORAL_TABLET | Freq: Two times a day (BID) | ORAL | 1 refills | Status: DC

## 2020-08-24 MED ORDER — POTASSIUM CHLORIDE CRYS ER 20 MEQ PO TBCR
40.0000 meq | EXTENDED_RELEASE_TABLET | Freq: Once | ORAL | Status: AC
  Administered 2020-08-24: 40 meq via ORAL
  Filled 2020-08-24: qty 2

## 2020-08-24 MED ORDER — METOPROLOL TARTRATE 25 MG PO TABS: 12.5000 mg | ORAL_TABLET | Freq: Two times a day (BID) | ORAL | 1 refills | Status: DC

## 2020-08-24 MED ORDER — FE FUMARATE-B12-VIT C-FA-IFC PO CAPS: 1.0000 | ORAL_CAPSULE | Freq: Two times a day (BID) | ORAL | 1 refills | Status: DC

## 2020-08-24 MED FILL — FEROCON CAPSULE: 30 days supply | Qty: 60 | Fill #0 | Status: TO

## 2020-08-24 MED FILL — METOPROLOL TARTRATE 25 MG T: 25 | 30 days supply | Qty: 30 | Fill #0 | Status: TO

## 2020-08-24 MED FILL — AMIODARONE HCL 200 MG TAB: 200 | 30 days supply | Qty: 60 | Fill #0 | Status: TO

## 2020-08-24 MED FILL — ELIQUIS 5 MG TABLET: 5 | 30 days supply | Qty: 60 | Fill #0 | Status: TO

## 2020-08-24 MED FILL — traMADol HCL 50 MG TABS: 50 | 7 days supply | Qty: 28 | Fill #0

## 2020-08-24 NOTE — Progress Notes (Addendum)
[address]       ,[place] [address]             [phone]      9 Days Post-Op Procedure(s) (LRB): CORONARY ARTERY BYPASS GRAFTING (CABG)X4. USING BILATERAL MAMMARY ARTERIES AND RIGHT GREATER SAPHENOUS VEIN HARVESTED ENDOSCOPICALLY. (N/A) TRANSESOPHAGEAL ECHOCARDIOGRAM (TEE) (N/A) Subjective: Some pain , relatively mild  Objective: Vital signs in last 24 hours: Temp:  [98 F (36.7 C)-98.9 F (37.2 C)] 98.2 F (36.8 C) (01/26 0347) Pulse Rate:  [58-68] 68 (01/26 0347) Cardiac Rhythm: Normal sinus rhythm (01/25 1937) Resp:  [13-20] 20 (01/26 0347) BP: (105-130)/(45-77) 118/77 (01/26 0347) SpO2:  [92 %-98 %] 92 % (01/26 0347) Weight:  [72.8 kg] 72.8 kg (01/26 0347)  Hemodynamic parameters for last 24 hours:    Intake/Output from previous day: No intake/output data recorded. Intake/Output this shift: No intake/output data recorded.  General appearance: alert, cooperative and no distress Heart: regular rate and rhythm Lungs: clear to auscultation bilaterally Abdomen: benign Extremities: trace edema Wound: incis healing well  Lab Results: Recent Labs    08/22/20 0125  WBC 6.7  HGB 8.1*  HCT 23.8*  PLT 302   BMET:  Recent Labs    08/22/20 0125  NA 139  K 3.7  CL 104  CO2 26  GLUCOSE 96  BUN 12  CREATININE 0.79  CALCIUM 8.7*    PT/INR: No results for input(s): LABPROT, INR in the last 72 hours. ABG    Component Value Date/Time   PHART 7.346 (L) 08/15/2020 2149   HCO3 22.0 08/15/2020 2149   TCO2 23 08/15/2020 2149   ACIDBASEDEF 3.0 (H) 08/15/2020 2149   O2SAT 97.0 08/15/2020 2149   CBG (last 3)  No results for input(s): GLUCAP in the last 72 hours.  Meds Scheduled Meds: . amiodarone  200 mg Oral BID  . apixaban  5 mg Oral BID  . aspirin EC  81 mg Oral Daily  .  Cardiac Surgery, Patient & Family Education   Does not apply Once  . ferrous fumarate-b12-vitamic C-folic acid  1 capsule Oral BID PC  . magnesium  oxide  400 mg Oral Daily  . mouth rinse  15 mL Mouth Rinse BID  . metoprolol tartrate  12.5 mg Oral BID  . pantoprazole  40 mg Oral QAC breakfast  . rosuvastatin  20 mg Oral QPM  . sodium chloride flush  3 mL Intravenous Q12H   Continuous Infusions: . sodium chloride     PRN Meds:.sodium chloride, acetaminophen, ondansetron **OR** ondansetron (ZOFRAN) IV, oxyCODONE, senna-docusate, sodium chloride flush, traMADol  Xrays No results found.  Assessment/Plan: S/P Procedure(s) (LRB): CORONARY ARTERY BYPASS GRAFTING (CABG)X4. USING BILATERAL MAMMARY ARTERIES AND RIGHT GREATER SAPHENOUS VEIN HARVESTED ENDOSCOPICALLY. (N/A) TRANSESOPHAGEAL ECHOCARDIOGRAM (TEE) (N/A)  1 afeb, BP well controlled, some pain  2 some sinus brady, short bursts of SVT, + PAC's, now on low dose beta blocker and amio has been reduced yesterday. May need to consider EP eval. Cont eloquis 3 sats ok on 2 liters 4 no new labs 5 cont pulm toilet and rehab 6 weight stable, does not appear significantly volume overloaded, normal EFx  LOS: 9 days    Rowe Clack PA-C Pager [phone] 08/24/2020   Chart reviewed, patient examined, agree with above. She feels well. Rhythm is mostly sinus with brief episode of atrial fib early this am but rate was ok. She felt mild palpitations when it occurred. I think she  can go home today on amio and Lopressor with Eliquis for anticoagulation. She does not need EP eval for this. She will likely continue to have intermittent episodes for a while but is mostly sinus. She is anxious to go home.

## 2020-08-24 NOTE — Progress Notes (Signed)
Discharge orders received, information reviewed with patient and spouse. They express understanding.  IV and telemetry removed, CCMD notified.

## 2020-08-24 NOTE — Progress Notes (Signed)
Client sitting up in chair upon entering the room in stable condition.  Client states she no longer has any pain.  Vitals checked and stable.  Patient is now up with therapy walking the hallway.  Will continue to follow orders and monitor.

## 2020-08-24 NOTE — Progress Notes (Signed)
CARDIAC REHAB PHASE I   PRE:  Rate/Rhythm: 58 SB    BP: sitting 110/56    SaO2: 99 RA  MODE:  Ambulation: 50 ft, then 600 ft   POST:  Rate/Rhythm: 80 SR    BP: sitting 111/52     SaO2: 99 RA  Pt excited to go home. Donned pants, stood, walked to sink then out in hall with standby assist. Pt suddenly felt dizzy and weak in legs, had to sit. Able to walk back to room and BP 113/51. Reviewed monitor and no arrhythmia noted. Pt then able to stand and walk 600 ft with rollator (there to sit if needed). Tolerated well, no arrhythmias, maintained NSR.  Felt well. Discussed pt getting up slower considering her new meds. Understands other education. Eager for d/c. [phone]  Harriet Masson CES, ACSM 08/24/2020 9:51 AM

## 2020-08-25 ENCOUNTER — Telehealth: Payer: Self-pay | Admitting: Pharmacist

## 2020-08-25 NOTE — Telephone Encounter (Signed)
Pharmacy Transitions of Care Follow-up Telephone Call  Date of discharge: 08/24/20  Discharge Diagnosis: post CABGx4  How have you been since you were released from the hospital? Patient is doing well at home   Medication changes made at discharge: Eliquis 5mg  bid           Amiodarone 200mg            Metoprolol tartrate 12.5mg  bid           Ferocon   Medication changes obtained and verified?   Yes  Medication Accessibility:  Home Pharmacy: [facility] [address]  Was the patient provided with refills on discharged medications? yes  Have all prescriptions been transferred from [facility] to home pharmacy? yes  Is the patient able to afford medications? yes    Medication Review:  APIXABEN (ELIQUIS)  Apixaban 10 mg BID initiated on 08/24/20. Will switch to apixaban 5 mg BID after 7 days (2/2/22DATE).  - Discussed importance of taking medication around the same time everyday  - Reviewed potential DDIs with patient  - Advised patient of medications to avoid (NSAIDs, ASA)  - Educated that Tylenol (acetaminophen) will be the preferred analgesic to prevent risk of bleeding  - Emphasized importance of monitoring for signs and symptoms of bleeding (abnormal bruising, prolonged bleeding, nose bleeds, bleeding from gums, discolored urine, black tarry stools)  - Advised patient to alert all providers of anticoagulation therapy prior to starting a new medication or having a procedure     Follow-up Appointments:  PCP Hospital f/u appt confirmed? no.   Specialist Hospital f/u appt confirmed? 08/26/20 NP 09/06/20 at 845am.   If their condition worsens, is the pt aware to call PCP or go to the Emergency Dept.?yes  Final Patient Assessment: Patient is doing well at home. She was able to tell me exactly how she was taking her medications and we discussed importance of compliance with these medications.

## 2020-08-30 ENCOUNTER — Other Ambulatory Visit: Payer: Self-pay

## 2020-08-30 ENCOUNTER — Encounter (INDEPENDENT_AMBULATORY_CARE_PROVIDER_SITE_OTHER): Payer: Self-pay

## 2020-08-30 DIAGNOSIS — Z4802 Encounter for removal of sutures: Secondary | ICD-10-CM

## 2020-08-31 ENCOUNTER — Telehealth (HOSPITAL_COMMUNITY): Payer: Self-pay

## 2020-08-31 NOTE — Telephone Encounter (Signed)
Pt insurance is active and benefits verified through [facility] Medicare. Co-pay $0.00, DED $0.00/$0.00 met, out of pocket $3,600.00/$30.00 met, co-insurance 0%. No pre-authorization required. Passport, 08/31/20 @ 11:58AM, KZL#[id number]  Will contact patient to see if she is interested in the Cardiac Rehab Program. If interested, patient will need to complete follow up appt. Once completed, patient will be contacted for scheduling upon review by the RN Navigator.

## 2020-08-31 NOTE — Telephone Encounter (Signed)
Called patient to see if she is interested in the Cardiac Rehab Program. Patient expressed interest. Explained scheduling process and went over insurance, patient verbalized understanding. Will contact patient for scheduling once f/u has been completed. 

## 2020-09-05 NOTE — Progress Notes (Signed)
Cardiology Clinic Note   Patient Name: Debbie Cardenas Date of Encounter: 09/06/2020  Primary Care Provider:  Porfirio Oar, PA Primary Cardiologist:  Peter Swaziland, MD  Patient Profile    Debbie Cardenas 75 year old female presents to the clinic today status post CABG x4 08/15/2020  Past Medical History    Past Medical History:  Diagnosis Date  . Anxiety   . Arthritis    knees  . Closed fracture of neck of left femur (HCC) 08/15/2018  . Coronary artery disease   . Hyperlipidemia   . Hypertension   . Pneumonia    Past Surgical History:  Procedure Laterality Date  . APPENDECTOMY    . CATARACT EXTRACTION, BILATERAL    . CHOLECYSTECTOMY    . CORONARY ARTERY BYPASS GRAFT N/A 08/15/2020   Procedure: CORONARY ARTERY BYPASS GRAFTING (CABG)X4. USING BILATERAL MAMMARY ARTERIES AND RIGHT GREATER SAPHENOUS VEIN HARVESTED ENDOSCOPICALLY.;  Surgeon: Alleen Borne, MD;  Location: [facility] OR;  Service: Open Heart Surgery;  Laterality: N/A;  . KNEE SURGERY    . LEFT HEART CATH AND CORONARY ANGIOGRAPHY N/A 07/27/2020   Procedure: LEFT HEART CATH AND CORONARY ANGIOGRAPHY;  Surgeon: Swaziland, Peter M, MD;  Location: [facility] INVASIVE CV LAB;  Service: Cardiovascular;  Laterality: N/A;  . TEE WITHOUT CARDIOVERSION N/A 08/15/2020   Procedure: TRANSESOPHAGEAL ECHOCARDIOGRAM (TEE);  Surgeon: Alleen Borne, MD;  Location: [facility] OR;  Service: Open Heart Surgery;  Laterality: N/A;  . TOTAL HIP ARTHROPLASTY Left 08/15/2018   Procedure: TOTAL HIP ARTHROPLASTY ANTERIOR APPROACH;  Surgeon: Kathryne Hitch, MD;  Location: [facility] ORS;  Service: Orthopedics;  Laterality: Left;    Allergies  Allergies  Allergen Reactions  . Ace Inhibitors Cough    History of Present Illness    [name] has a PMH of hypertension, hyperlipidemia, degenerative arthritis of both knees, and a family history of coronary artery disease.  She is a former smoker.  She underwent coronary CTA which showed a calcium score over 1000  and stenosis that was flow-limiting in the RCA and LAD.  It was felt to be high risk and.  She had a cardiac event monitor which showed normal sinus rhythm.  She underwent cardiac catheterization 07/08/2020 which showed 70% mid-distal left main stenosis.  50% ostial-proximal LAD stenosis and 70% proximal mid LAD stenosis, moderate size diagonal branch was noted off the area of stenosis, first marginal was noted to have 50% stenosis, ostium of RCA had 90% stenosis, and LV EF was 55-60% with normal LVEDP.  Her husband had bypass surgery by Dr. Tyrone Sage.  She reported a right knee injury from when a dog ran into her which has limited her physical activity.  She discussed coronary artery bypass graft with Dr. Laneta Simmers.  Benefits and risk of the surgery were discussed and she agreed to proceed with Surgery.  Her preoperative carotid duplex showed no significant right ICA stenosis and 40-59% left ICA stenosis.  She underwent successful CABG times 4 08/05/20.  She was discharged 08/24/2020.  She developed postoperative atrial fibrillation on day 5.  She was discharged on Eliquis and amiodarone.  Blood pressure 110/56 with pulse of 59.  She presents the clinic today for follow-up evaluation states she feels well.  She continues to notice some swelling in her right lower leg.  She is limited in her physical activity due to her bilateral knee pain.  She reports that she has been washing clothes and doing some cooking.  She does continue to follow her sternal  precautions and is walking a minimal amount.  She also reports that she has intermittent episodes of rapid heartbeat.  EKG today shows normal sinus rhythm 62 bpm.  I will give her the [place] support stocking sheet, have her elevate her lower extremities when she is an active, give her the salty 6 sheet, repeat her fasting lipids and LFTs, and have her follow-up with Dr. Swaziland in 3 months.  Today she denies chest pain, shortness of breath, lower extremity edema,  fatigue, palpitations, melena, hematuria, hemoptysis, diaphoresis, weakness, presyncope, syncope, orthopnea, and PND.   Home Medications    Prior to Admission medications   Medication Sig Start Date End Date Taking? Authorizing Provider  acetaminophen (TYLENOL) 650 MG CR tablet Take 1,300 mg by mouth 2 (two) times daily as needed for pain.    [provider]  amiodarone (PACERONE) 200 MG tablet Take 1 tablet (200 mg total) by mouth 2 (two) times daily. 08/24/20   Gold, Glenice Laine, PA-C  apixaban (ELIQUIS) 5 MG TABS tablet Take 1 tablet (5 mg total) by mouth 2 (two) times daily. 08/24/20   Rowe Clack, PA-C  aspirin EC 81 MG tablet Take 1 tablet (81 mg total) by mouth daily. Swallow whole. 07/11/20   Swaziland, Peter M, MD  Cholecalciferol (VITAMIN D) 50 MCG (2000 UT) tablet Take 2,000 Units by mouth daily.    [provider]  ferrous fumarate-b12-vitamic C-folic acid (TRINSICON / FOLTRIN) capsule Take 1 capsule by mouth 2 (two) times daily after a meal. 08/24/20   Gold, Glenice Laine, PA-C  MAGNESIUM OXIDE PO Take 500 mg by mouth daily.    [provider]  metoprolol tartrate (LOPRESSOR) 25 MG tablet Take 0.5 tablets (12.5 mg total) by mouth 2 (two) times daily. 08/24/20   Gold, Deniece Portela E, PA-C  rosuvastatin (CRESTOR) 20 MG tablet Take 1 tablet (20 mg total) by mouth daily. 07/11/20 10/09/20  Swaziland, Peter M, MD    Family History    Family History  Problem Relation Age of Onset  . Cancer Sister   . Heart disease Sister   . Heart attack Brother   . Heart disease Brother        s/p CABG  . Heart attack Father   . Heart disease Sister   . Heart attack Brother   . Heart disease Brother    She indicated that her mother is deceased. She indicated that her father is deceased. She indicated that both of her sisters are alive. She indicated that only one of her two brothers is alive.  Social History    Social History   Socioeconomic History  . Marital status: Married     Spouse name: Not on file  . Number of children: 3  . Years of education: Not on file  . Highest education level: Not on file  Occupational History  . Not on file  Tobacco Use  . Smoking status: Former Smoker    Quit date: 06/13/2014    Years since quitting: 6.2  . Smokeless tobacco: Never Used  Vaping Use  . Vaping Use: Former  Substance and Sexual Activity  . Alcohol use: Not Currently  . Drug use: Never  . Sexual activity: Not on file  Other Topics Concern  . Not on file  Social History Narrative  . Not on file   Social Determinants of Health   Financial Resource Strain: Not on file  Food Insecurity: Not on file  Transportation Needs: Not on file  Physical  Activity: Not on file  Stress: Not on file  Social Connections: Not on file  Intimate Partner Violence: Not on file     Review of Systems    General:  No chills, fever, night sweats or weight changes.  Cardiovascular:  No chest pain, dyspnea on exertion, edema, orthopnea, palpitations, paroxysmal nocturnal dyspnea. Dermatological: No rash, lesions/masses Respiratory: No cough, dyspnea Urologic: No hematuria, dysuria Abdominal:   No nausea, vomiting, diarrhea, bright red blood per rectum, melena, or hematemesis Neurologic:  No visual changes, wkns, changes in mental status. All other systems reviewed and are otherwise negative except as noted above.  Physical Exam    VS:  BP 126/60   Pulse 62   Ht 5\' 5"  (1.651 m)   Wt 159 lb (72.1 kg)   SpO2 96%   BMI 26.46 kg/m  , BMI Body mass index is 26.46 kg/m. GEN: Well nourished, well developed, in no acute distress. HEENT: normal. Neck: Supple, no JVD, carotid bruits, or masses. Cardiac: RRR, no murmurs, rubs, or gallops. No clubbing, cyanosis, generalized right lower extremity edema.  Radials/DP/PT 2+ and equal bilaterally.  Respiratory:  Respirations regular and unlabored, clear to auscultation bilaterally. GI: Soft, nontender, nondistended, BS + x 4. MS: no  deformity or atrophy. Skin: warm and dry, no rash.  Sternal incision/chest tube sites/bilateral SVG sites healing well now drainage or erythema Neuro:  Strength and sensation are intact. Psych: Normal affect.  Accessory Clinical Findings    Recent Labs: 08/11/2020: ALT 13 08/22/2020: BUN 12; Creatinine, Ser 0.79; Hemoglobin 8.1; Magnesium 2.0; Platelets 302; Potassium 3.7; Sodium 139   Recent Lipid Panel    Component Value Date/Time   CHOL 221 (H) 08/15/2018 1502   TRIG 59 08/15/2018 1502   HDL 55 08/15/2018 1502   CHOLHDL 4.0 08/15/2018 1502   VLDL 12 08/15/2018 1502   LDLCALC 154 (H) 08/15/2018 1502    ECG personally reviewed by me today-normal sinus rhythm T wave abnormality consider anterior ischemia prolonged QT 62 bpm- No acute changes  Echocardiogram 08/16/18  Study Conclusions   - Left ventricle: The cavity size was normal. There was mild  concentric hypertrophy. Systolic function was normal. The  estimated ejection fraction was in the range of 60% to 65%. Wall  motion was normal; there were no regional wall motion  abnormalities. Doppler parameters are consistent with abnormal  left ventricular relaxation (grade 1 diastolic dysfunction).  There was no evidence of elevated ventricular filling pressure by  Doppler parameters.  - Aortic valve: Valve area (VTI): 2.27 cm^2. Valve area (Vmax): 2.1  cm^2. Valve area (Vmean): 2.24 cm^2.  - Mitral valve: Valve area by pressure half-time: 2.44 cm^2.  - Right ventricle: Systolic function was normal.  - Right atrium: The atrium was normal in size.  - Tricuspid valve: There was no regurgitation.  - Pulmonary arteries: Systolic pressure was within the normal  range.  - Inferior vena cava: The vessel was normal in size.  - Pericardium, extracardiac: There was no pericardial effusion.  Carotid Dopplers 08/11/2018  Summary:  Right Carotid: The extracranial vessels were near-normal with only minimal  wall          thickening or plaque.   Left Carotid: Velocities in the left ICA are consistent with a 40-59%  stenosis.  Vertebrals: Right vertebral artery demonstrates antegrade flow. Left  vertebral        - dampened.  Subclavians: Normal flow hemodynamics were seen in bilateral subclavian  arteries.   Right ABI: Resting right ankle-brachial index is within normal range. No  evidence of significant right lower extremity arterial disease.  Left ABI: Resting left ankle-brachial index is within normal range. No  evidence of significant left lower extremity arterial disease.  Right Upper Extremity: Doppler waveforms remain within normal limits with  right radial compression. Doppler waveforms decrease 50% with right ulnar  compression.  Left Upper Extremity: Doppler waveforms remain within normal limits with  left radial compression. Doppler waveform obliterate with left ulnar  compression.   Assessment & Plan   1.  Coronary artery disease-healing well after CABG and progressing her physical activity.  Status post CABG x4 by Dr. Laneta Simmers on 08/15/2020. Continue aspirin, metoprolol, rosuvastatin Heart healthy low-sodium diet-salty 6 given Increase physical activity as tolerated  Atrial fibrillation-EKG today shows normal sinus rhythm T wave abnormality, consider anterior ischemia prolonged QT 62 bpm.  Postoperative day 5 started on amiodarone and Eliquis. Continue amiodarone, apixaban, metoprolol Heart healthy low-sodium diet-salty 6 given Increase physical activity as tolerated  Hyperlipidemia-coronary plaque noted on coronary CTA Continue rosuvastatin, aspirin Heart healthy low-sodium diet-salty 6 given Increase physical activity as tolerated Repeat fasting lipids and LFTs  Essential hypertension-BP today 126/60.  Well-controlled at home. Continue metoprolol Heart healthy low-sodium diet-salty 6 given Increase physical activity as tolerated  Carotid artery  stenosis-carotid Doppler showed normal right carotid artery with left 40-59% carotid stenosis. Continue rosuvastatin, aspirin Heart healthy low-sodium diet-salty 6 given Increase physical activity as tolerated Repeat carotid Dopplers 1/23  Disposition: Follow-up with Dr. Swaziland in 3 months.  Thomasene Ripple. Shalamar Plourde NP-C    09/06/2020, 9:03 AM [facility] [address] Office [phone] Fax [phone]  Notice: This dictation was prepared with Dragon dictation along with smaller phrase technology. Any transcriptional errors that result from this process are unintentional and may not be corrected upon review.  I spent 15 minutes examining this patient, reviewing medications, and using patient centered shared decision making involving her cardiac care.  Prior to her visit I spent greater than 20 minutes reviewing her past medical history,  medications, and prior cardiac tests.

## 2020-09-06 ENCOUNTER — Encounter: Payer: Self-pay | Admitting: General Practice

## 2020-09-06 ENCOUNTER — Other Ambulatory Visit: Payer: Self-pay

## 2020-09-06 ENCOUNTER — Ambulatory Visit: Payer: Medicare Other | Admitting: General Practice

## 2020-09-06 VITALS — BP 126/60 | HR 62 | Ht 65.0 in | Wt 159.0 lb

## 2020-09-06 DIAGNOSIS — I2510 Atherosclerotic heart disease of native coronary artery without angina pectoris: Secondary | ICD-10-CM | POA: Diagnosis not present

## 2020-09-06 DIAGNOSIS — E7800 Pure hypercholesterolemia, unspecified: Secondary | ICD-10-CM | POA: Diagnosis not present

## 2020-09-06 DIAGNOSIS — I4891 Unspecified atrial fibrillation: Secondary | ICD-10-CM

## 2020-09-06 DIAGNOSIS — I10 Essential (primary) hypertension: Secondary | ICD-10-CM

## 2020-09-06 DIAGNOSIS — I6522 Occlusion and stenosis of left carotid artery: Secondary | ICD-10-CM

## 2020-09-06 DIAGNOSIS — Z79899 Other long term (current) drug therapy: Secondary | ICD-10-CM

## 2020-09-06 NOTE — Patient Instructions (Signed)
Medication Instructions:  The current medical regimen is effective;  continue present plan and medications as directed. Please refer to the Current Medication list given to you today.  *If you need a refill on your cardiac medications before your next appointment, please call your pharmacy*  Lab Work: FASTING LIPID AND LFT If you have labs (blood work) drawn today and your tests are completely normal, you will receive your results only by:  MyChart Message (if you have MyChart) OR A paper copy in the mail.  If you have any lab test that is abnormal or we need to change your treatment, we will call you to review the results. You may go to any [facility] that is convenient for you however, we do have a lab in our office that is able to assist you. You DO NOT need an appointment for our lab. The lab is open 8:00am and closes at 4:00pm. Lunch 12:45 - 1:45pm.  Testing/Procedures: Your physician has requested that you have a carotid duplex(JAN 2023). This test is an ultrasound of the carotid arteries in your neck. It looks at blood flow through these arteries that supply the brain with blood. Allow one hour for this exam. There are no restrictions or special instructions.  Special Instructions PLEASE PURCHASE AND WEAR COMPRESSION STOCKINGS DAILY AND TAKE OFF AT BEDTIME. Compression stockings are elastic socks that squeeze the legs. They help to increase blood flow to the legs and to decrease swelling in the legs from fluid retention, and reduce the chance of developing blood clots in the lower legs. Please put on in the AM when dressing and off at night when dressing for bed.  LET THEM KNOW THAT YOU NEED KNEE HIGH'S WITH COMPRESSION OF 15-20 mmhg. [employer]  [address] ([phone]); [place], [place] [address]; [phone]  EMAIL   [email or web address].  PLEASE MAKE SURE TO ELEVATE YOUR FEET & LEGS WHILE SITTING, THIS WILL HELP WITH THE SWELLING ALSO.   PLEASE READ AND FOLLOW SALTY  6-ATTACHED-1,800 mg daily  Follow-Up: Your next appointment:  3 month(s) In Person with Peter Swaziland, MD OR IF UNAVAILABLE JESSE CLEAVER, FNP-C  At [facility], you and your health needs are our priority.  As part of our continuing mission to provide you with exceptional heart care, we have created designated Provider Care Teams.  These Care Teams include your primary Cardiologist (physician) and Advanced Practice Providers (APPs -  Physician Assistants and Nurse Practitioners) who all work together to provide you with the care you need, when you need it.

## 2020-09-12 ENCOUNTER — Telehealth: Payer: Self-pay

## 2020-09-12 ENCOUNTER — Telehealth: Payer: Self-pay | Admitting: Cardiology

## 2020-09-12 MED ORDER — AMLODIPINE BESYLATE 5 MG PO TABS: 5.0000 mg | ORAL_TABLET | Freq: Every day | ORAL | 3 refills | Status: DC

## 2020-09-12 NOTE — Telephone Encounter (Signed)
Received a call from home health RN from [facility].B/P elevated this morning 190/80,210/80 pulse 48 to 54.No symptoms.Spoke to Edd Fabian NP he advised to add Amlodipine 5 mg daily.Avoid salt.Check B/P daily and bring readings to appointment.Appointment scheduled with Verdon Cummins 10/11/20 at 11:45 am.

## 2020-09-12 NOTE — Telephone Encounter (Signed)
Debbie Cardenas, Home health nurse with [facility] concerned about patient's blood pressure was elevated when checked this morning at home health visit.  She stated that she was to discharge her from Home health today.  Advised to contact patient's Cardiology office, Dr. Swaziland, for medication adjustments and advise.  She acknowledged receipt.

## 2020-09-12 NOTE — Telephone Encounter (Signed)
See previous 09/12/20 telephone note.

## 2020-09-13 LAB — LIPID PANEL
Chol/HDL Ratio: 2.1 ratio (ref 0.0–4.4)
Cholesterol, Total: 143 mg/dL (ref 100–199)
HDL: 69 mg/dL (ref >39)
LDL Chol Calc (NIH): 60 mg/dL (ref 0–99)
Triglycerides: 67 mg/dL (ref 0–149)
VLDL Cholesterol Cal: 14 mg/dL (ref 5–40)

## 2020-09-13 LAB — HEPATIC FUNCTION PANEL
ALT: 6 IU/L (ref 0–32)
AST: 14 IU/L (ref 0–40)
Albumin: 4.3 g/dL (ref 3.7–4.7)
Alkaline Phosphatase: 70 IU/L (ref 44–121)
Bilirubin Total: 0.7 mg/dL (ref 0.0–1.2)
Bilirubin, Direct: 0.23 mg/dL (ref 0.00–0.40)
Total Protein: 6.7 g/dL (ref 6.0–8.5)

## 2020-09-20 ENCOUNTER — Other Ambulatory Visit: Payer: Self-pay | Admitting: Surgery

## 2020-09-20 DIAGNOSIS — Z951 Presence of aortocoronary bypass graft: Secondary | ICD-10-CM

## 2020-09-21 ENCOUNTER — Encounter: Payer: Self-pay | Admitting: Surgery

## 2020-09-21 ENCOUNTER — Ambulatory Visit (INDEPENDENT_AMBULATORY_CARE_PROVIDER_SITE_OTHER): Payer: Self-pay | Admitting: Surgery

## 2020-09-21 ENCOUNTER — Other Ambulatory Visit: Payer: Self-pay

## 2020-09-21 ENCOUNTER — Ambulatory Visit
Admission: RE | Admit: 2020-09-21 | Discharge: 2020-09-21 | Disposition: A | Discharge status: Home / Self Care | Payer: Medicare Other | Source: Ambulatory Visit | Attending: Surgery | Admitting: Surgery

## 2020-09-21 VITALS — BP 125/66 | HR 50 | Resp 20 | Ht 65.0 in | Wt 159.8 lb

## 2020-09-21 DIAGNOSIS — Z951 Presence of aortocoronary bypass graft: Secondary | ICD-10-CM

## 2020-09-21 NOTE — Progress Notes (Signed)
HPI: Patient returns for routine postoperative follow-up having undergone coronary bypass graft surgery x4 using bilateral internal mammary artery grafts on 08/15/2020. The patient's early postoperative recovery while in the hospital was notable for development of postoperative atrial fibrillation converted with amiodarone.  She had recurrent episodes of atrial fibrillation and was started on Eliquis. Since hospital discharge the patient reports that she has been feeling well.  She is ambulating without shortness of breath or chest pain.  She has noticed some irregular heart rhythm when laying down to sleep at night but nothing like when she was in the hospital.   Current Outpatient Medications  Medication Sig Dispense Refill  . acetaminophen (TYLENOL) 650 MG CR tablet Take 1,300 mg by mouth 2 (two) times daily as needed for pain.    Marland Kitchen amiodarone (PACERONE) 200 MG tablet Take 1 tablet (200 mg total) by mouth 2 (two) times daily. 60 tablet 1  . amLODipine (NORVASC) 5 MG tablet Take 1 tablet (5 mg total) by mouth daily. 90 tablet 3  . apixaban (ELIQUIS) 5 MG TABS tablet Take 1 tablet (5 mg total) by mouth 2 (two) times daily. 60 tablet 1  . aspirin EC 81 MG tablet Take 1 tablet (81 mg total) by mouth daily. Swallow whole. 30 tablet 11  . Cholecalciferol (VITAMIN D) 50 MCG (2000 UT) tablet Take 2,000 Units by mouth daily.    . ferrous fumarate-b12-vitamic C-folic acid (TRINSICON / FOLTRIN) capsule Take 1 capsule by mouth 2 (two) times daily after a meal. 60 capsule 1  . MAGNESIUM OXIDE PO Take 500 mg by mouth daily.    . metoprolol tartrate (LOPRESSOR) 25 MG tablet Take 0.5 tablets (12.5 mg total) by mouth 2 (two) times daily. 30 tablet 1  . rosuvastatin (CRESTOR) 20 MG tablet Take 1 tablet (20 mg total) by mouth daily. 90 tablet 3   Current Facility-Administered Medications  Medication Dose Route Frequency Provider Last Rate Last Admin  . sodium chloride flush (NS) 0.9 % injection 3 mL  3 mL  Intravenous Q12H Swaziland, Peter M, MD        Physical Exam: BP 125/66 (BP Location: Right Arm, Patient Position: Sitting)   Pulse (!) 50   Resp 20   Ht 5\' 5"  (1.651 m)   Wt 72.5 kg   SpO2 98% Comment: RA  BMI 26.59 kg/m  She looks well. Cardiac exam shows a regular rate and rhythm with normal heart sounds.  There is no murmur. Lungs are clear. The chest incision is healing well and the sternum is stable. Her leg incisions are healing well.  There is mild right lower leg edema secondary to harvesting the saphenous vein.  She is wearing compression stockings.  Diagnostic Tests:  CLINICAL DATA:  CABG on 08/15/2020, follow-up. No chest complaints today.  EXAM: CHEST - 2 VIEW  COMPARISON:  Chest radiograph August 17, 2020.  FINDINGS: Changes of prior median sternotomy and CABG. Similar enlarged cardiac silhouette. No focal consolidation. Trace right pleural effusion. No visible pneumothorax. Cholecystectomy clips. The visualized skeletal structures are unremarkable.  IMPRESSION: 1. Trace right pleural effusion with stable cardiomegaly.   Electronically Signed   By: August 19, 2020 MD   On: 09/21/2020 12:31   Impression:  Overall I think she is doing well 5 weeks following her bypass surgery.  I told her to decrease the amiodarone to 200 mg once daily.  She will continue her current dose of metoprolol and Eliquis.  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  Plan:  She will continue to follow-up with cardiology and will return to see me if she has any problems with her incisions.   Alleen Borne, MD [facility] [phone]

## 2020-09-23 ENCOUNTER — Telehealth: Payer: Self-pay | Admitting: Cardiology

## 2020-09-23 MED ORDER — FE FUMARATE-B12-VIT C-FA-IFC PO CAPS: 1.0000 | ORAL_CAPSULE | Freq: Two times a day (BID) | ORAL | 1 refills | Status: DC

## 2020-09-23 NOTE — Telephone Encounter (Signed)
*  STAT* If patient is at the pharmacy, call can be transferred to refill team.   1. Which medications need to be refilled? (please list name of each medication and dose if known)  ferrous fumarate-b12-vitamic C-folic acid (TRINSICON / FOLTRIN) capsule  2. Which pharmacy/location (including street and city if local pharmacy) is medication to be sent to? [id number] [place], [place] - [address]  3. Do they need a 30 day or 90 day supply? 30 with refills    Patient said this medication was written for her while she was in the hospital. She is having a hard time [facility]

## 2020-10-10 NOTE — Progress Notes (Unsigned)
Cardiology Clinic Note   Patient Name: Debbie Cardenas Date of Encounter: 10/11/2020  Primary Care Provider:  Porfirio Oar, PA Primary Cardiologist:  Peter Swaziland, MD  Patient Profile    Debbie Cardenas 75 year old female presents to the clinic today for follow-up, status post CABG x4 08/15/2020.  Also following up for hypertension.  Past Medical History    Past Medical History:  Diagnosis Date  . Anxiety   . Arthritis    knees  . Closed fracture of neck of left femur (HCC) 08/15/2018  . Coronary artery disease   . Hyperlipidemia   . Hypertension   . Pneumonia    Past Surgical History:  Procedure Laterality Date  . APPENDECTOMY    . CATARACT EXTRACTION, BILATERAL    . CHOLECYSTECTOMY    . CORONARY ARTERY BYPASS GRAFT N/A 08/15/2020   Procedure: CORONARY ARTERY BYPASS GRAFTING (CABG)X4. USING BILATERAL MAMMARY ARTERIES AND RIGHT GREATER SAPHENOUS VEIN HARVESTED ENDOSCOPICALLY.;  Surgeon: Alleen Borne, MD;  Location: [facility] OR;  Service: Open Heart Surgery;  Laterality: N/A;  . KNEE SURGERY    . LEFT HEART CATH AND CORONARY ANGIOGRAPHY N/A 07/27/2020   Procedure: LEFT HEART CATH AND CORONARY ANGIOGRAPHY;  Surgeon: Swaziland, Peter M, MD;  Location: [facility] INVASIVE CV LAB;  Service: Cardiovascular;  Laterality: N/A;  . TEE WITHOUT CARDIOVERSION N/A 08/15/2020   Procedure: TRANSESOPHAGEAL ECHOCARDIOGRAM (TEE);  Surgeon: Alleen Borne, MD;  Location: [facility] OR;  Service: Open Heart Surgery;  Laterality: N/A;  . TOTAL HIP ARTHROPLASTY Left 08/15/2018   Procedure: TOTAL HIP ARTHROPLASTY ANTERIOR APPROACH;  Surgeon: Kathryne Hitch, MD;  Location: [facility] ORS;  Service: Orthopedics;  Laterality: Left;    Allergies  Allergies  Allergen Reactions  . Ace Inhibitors Cough    History of Present Illness    Debbie Cardenas has a PMH of hypertension, hyperlipidemia, degenerative arthritis of both knees, and a family history of coronary artery disease.  She is a former smoker.  She  underwent coronary CTA which showed a calcium score over 1000 and stenosis that was flow-limiting in the RCA and LAD.  It was felt to be high risk and.  She had a cardiac event monitor which showed normal sinus rhythm.  She underwent cardiac catheterization 07/08/2020 which showed 70% mid-distal left main stenosis.  50% ostial-proximal LAD stenosis and 70% proximal mid LAD stenosis, moderate size diagonal branch was noted off the area of stenosis, first marginal was noted to have 50% stenosis, ostium of RCA had 90% stenosis, and LV EF was 55-60% with normal LVEDP.  Her husband had bypass surgery by Dr. Tyrone Sage.  She reported a right knee injury from when a dog ran into her which has limited her physical activity.  She discussed coronary artery bypass graft with Dr. Laneta Simmers.  Benefits and risk of the surgery were discussed and she agreed to proceed with Surgery.  Her preoperative carotid duplex showed no significant right ICA stenosis and 40-59% left ICA stenosis.  She underwent successful CABG times 4 08/05/20.  She was discharged 08/24/2020.  She developed postoperative atrial fibrillation on day 5.  She was discharged on Eliquis and amiodarone.  Blood pressure 110/56 with pulse of 59.  She presented the clinic to 822 for follow-up evaluation stated she felt well.  She continued to notice some swelling in her right lower leg.  She was limited in her physical activity due to her bilateral knee pain.  She reported that she has been washing clothes and doing some  cooking.  She did continue to follow her sternal precautions and was walking a minimal amount.  She also reported that she had intermittent episodes of rapid heartbeat.  EKG  showed normal sinus rhythm 62 bpm.  I will gave her the [place] support stocking sheet, had her elevate her lower extremities when  inactive, gave her the salty 6 sheet, repeated her fasting lipids and LFTs, and planned follow-up with Dr. SwazilandJordan in 3 months.  She contacted the  nurse triage line on 09/12/2020 and reported that home health nurse had taken her blood pressure.  Her readings were 190/80 and 210/80.  Her pulse was 48 and 54.  Amlodipine 5 mg was added to her medication regimen.  She was instructed to avoid sodium.  She presents the clinic today for follow-up evaluation states her blood pressures better controlled with the addition of amlodipine.  However she does note bilateral generalized lower extremity edema.  She reports that she has been trying to maintain a low-sodium diet and been increasing her physical activity slowly.  She also reports that she has been wearing lower extremity support stockings.  She continues to notice the occasional palpitations/flutter.  She reports these are very brief and that she is mainly noticing them in the evening.  She reports that she will start cardiac rehab in April.  We will have her have a BMP and CBC drawn next week.  I will have her maintain her appointment with Dr. SwazilandJordan in May.  Today she denies chest pain, shortness of breath, lower extremity edema, fatigue, palpitations, melena, hematuria, hemoptysis, diaphoresis, weakness, presyncope, syncope, orthopnea, and PND.  Home Medications    Prior to Admission medications   Medication Sig Start Date End Date Taking? Authorizing Provider  acetaminophen (TYLENOL) 650 MG CR tablet Take 1,300 mg by mouth 2 (two) times daily as needed for pain.    [provider]  amiodarone (PACERONE) 200 MG tablet Take 1 tablet (200 mg total) by mouth 2 (two) times daily. 08/24/20   Gold, Wayne E, PA-C  amLODipine (NORVASC) 5 MG tablet Take 1 tablet (5 mg total) by mouth daily. 09/12/20 12/11/20  Ronney Astersleaver, Jesse M, NP  apixaban (ELIQUIS) 5 MG TABS tablet Take 1 tablet (5 mg total) by mouth 2 (two) times daily. 08/24/20   Rowe ClackGold, Wayne E, PA-C  aspirin EC 81 MG tablet Take 1 tablet (81 mg total) by mouth daily. Swallow whole. 07/11/20   SwazilandJordan, Peter M, MD  Cholecalciferol (VITAMIN D) 50  MCG (2000 UT) tablet Take 2,000 Units by mouth daily.    [provider]  ferrous fumarate-b12-vitamic C-folic acid (TRINSICON / FOLTRIN) capsule Take 1 capsule by mouth 2 (two) times daily after a meal. 09/23/20   SwazilandJordan, Peter M, MD  MAGNESIUM OXIDE PO Take 500 mg by mouth daily.    [provider]  metoprolol tartrate (LOPRESSOR) 25 MG tablet Take 0.5 tablets (12.5 mg total) by mouth 2 (two) times daily. 08/24/20   Gold, Deniece PortelaWayne E, PA-C  rosuvastatin (CRESTOR) 20 MG tablet Take 1 tablet (20 mg total) by mouth daily. 07/11/20 10/09/20  SwazilandJordan, Peter M, MD    Family History    Family History  Problem Relation Age of Onset  . Cancer Sister   . Heart disease Sister   . Heart attack Brother   . Heart disease Brother        s/p CABG  . Heart attack Father   . Heart disease Sister   . Heart attack Brother   .  Heart disease Brother    She indicated that her mother is deceased. She indicated that her father is deceased. She indicated that both of her sisters are alive. She indicated that only one of her two brothers is alive.  Social History    Social History   Socioeconomic History  . Marital status: Married    Spouse name: Not on file  . Number of children: 3  . Years of education: Not on file  . Highest education level: Not on file  Occupational History  . Not on file  Tobacco Use  . Smoking status: Former Smoker    Quit date: 06/13/2014    Years since quitting: 6.3  . Smokeless tobacco: Never Used  Vaping Use  . Vaping Use: Former  Substance and Sexual Activity  . Alcohol use: Not Currently  . Drug use: Never  . Sexual activity: Not on file  Other Topics Concern  . Not on file  Social History Narrative  . Not on file   Social Determinants of Health   Financial Resource Strain: Not on file  Food Insecurity: Not on file  Transportation Needs: Not on file  Physical Activity: Not on file  Stress: Not on file  Social Connections: Not on file  Intimate  Partner Violence: Not on file     Review of Systems    General:  No chills, fever, night sweats or weight changes.  Cardiovascular:  No chest pain, dyspnea on exertion, edema, orthopnea, palpitations, paroxysmal nocturnal dyspnea. Dermatological: No rash, lesions/masses Respiratory: No cough, dyspnea Urologic: No hematuria, dysuria Abdominal:   No nausea, vomiting, diarrhea, bright red blood per rectum, melena, or hematemesis Neurologic:  No visual changes, wkns, changes in mental status. All other systems reviewed and are otherwise negative except as noted above.  Physical Exam    VS:  BP 136/62   Pulse (!) 50   Ht 5\' 5"  (1.651 m)   Wt 158 lb 6.4 oz (71.8 kg)   SpO2 98%   BMI 26.36 kg/m  , BMI Body mass index is 26.36 kg/m. GEN: Well nourished, well developed, in no acute distress. HEENT: normal. Neck: Supple, no JVD, carotid bruits, or masses. Cardiac: RRR, no murmurs, rubs, or gallops. No clubbing, cyanosis, edema.  Radials/DP/PT 2+ and equal bilaterally.  Respiratory:  Respirations regular and unlabored, clear to auscultation bilaterally. GI: Soft, nontender, nondistended, BS + x 4. MS: no deformity or atrophy. Skin: warm and dry, no rash. Neuro:  Strength and sensation are intact. Psych: Normal affect.  Accessory Clinical Findings    Recent Labs: 08/22/2020: BUN 12; Creatinine, Ser 0.79; Hemoglobin 8.1; Magnesium 2.0; Platelets 302; Potassium 3.7; Sodium 139 09/13/2020: ALT 6   Recent Lipid Panel    Component Value Date/Time   CHOL 143 09/13/2020 0946   TRIG 67 09/13/2020 0946   HDL 69 09/13/2020 0946   CHOLHDL 2.1 09/13/2020 0946   CHOLHDL 4.0 08/15/2018 1502   VLDL 12 08/15/2018 1502   LDLCALC 60 09/13/2020 0946    ECG personally reviewed by me today-none today.  EKG 09/06/2020 normal sinus rhythm T wave abnormality consider anterior ischemia prolonged QT 62 bpm- No acute changes  Echocardiogram 08/16/18  Study Conclusions   - Left ventricle: The  cavity size was normal. There was mild  concentric hypertrophy. Systolic function was normal. The  estimated ejection fraction was in the range of 60% to 65%. Wall  motion was normal; there were no regional wall motion  abnormalities. Doppler parameters are consistent with  abnormal  left ventricular relaxation (grade 1 diastolic dysfunction).  There was no evidence of elevated ventricular filling pressure by  Doppler parameters.  - Aortic valve: Valve area (VTI): 2.27 cm^2. Valve area (Vmax): 2.1  cm^2. Valve area (Vmean): 2.24 cm^2.  - Mitral valve: Valve area by pressure half-time: 2.44 cm^2.  - Right ventricle: Systolic function was normal.  - Right atrium: The atrium was normal in size.  - Tricuspid valve: There was no regurgitation.  - Pulmonary arteries: Systolic pressure was within the normal  range.  - Inferior vena cava: The vessel was normal in size.  - Pericardium, extracardiac: There was no pericardial effusion.  Carotid Dopplers 08/11/2018  Summary:  Right Carotid: The extracranial vessels were near-normal with only minimal  wall         thickening or plaque.   Left Carotid: Velocities in the left ICA are consistent with a 40-59%  stenosis.  Vertebrals: Right vertebral artery demonstrates antegrade flow. Left  vertebral        - dampened.  Subclavians: Normal flow hemodynamics were seen in bilateral subclavian        arteries.   Right ABI: Resting right ankle-brachial index is within normal range. No  evidence of significant right lower extremity arterial disease.  Left ABI: Resting left ankle-brachial index is within normal range. No  evidence of significant left lower extremity arterial disease.  Right Upper Extremity: Doppler waveforms remain within normal limits with  right radial compression. Doppler waveforms decrease 50% with right ulnar  compression.  Left Upper Extremity: Doppler waveforms remain within normal  limits with  left radial compression. Doppler waveform obliterate with left ulnar  compression.   Assessment & Plan   1.  Essential hypertension-BP today 136/62.  Well-controlled at home.  Amlodipine 5 mg added 09/12/20. Continue metoprolol,  Decrease amlodipine 2.5 mg daily Start hydrochlorothiazide 12.5 daily Heart healthy low-sodium diet-salty 6 given Increase physical activity as tolerated Maintain blood pressure log Order BMP and CBC in 1 week  Coronary artery disease-progressing well after CABG.  Status post CABG x4 by Dr. Laneta Simmers on 08/15/2020.  Still reports some mild chest wall tenderness around incision. Continue aspirin, metoprolol, rosuvastatin Heart healthy low-sodium diet-salty 6 given Increase physical activity as tolerated, maintain sternal precautions.  Atrial fibrillation-heart rate 50 and regular.   Postop day 5 started on amiodarone and Eliquis. Continue amiodarone, apixaban, metoprolol Heart healthy low-sodium diet-salty 6 given Increase physical activity as tolerated  Hyperlipidemia-09/13/2020: Cholesterol, Total 143; HDL 69; LDL Chol Calc (NIH) 60; Triglycerides 67 Continue rosuvastatin, aspirin Heart healthy low-sodium diet-salty 6 given Increase physical activity as tolerated  Carotid artery stenosis-carotid Doppler showed normal right carotid artery with left 40-59% carotid stenosis. Continue rosuvastatin, aspirin Heart healthy low-sodium diet-salty 6 given Increase physical activity as tolerated Repeat carotid Dopplers 1/23  Disposition: Follow-up with Dr. Swaziland as scheduled.   Thomasene Ripple. Cleaver NP-C    10/11/2020, 12:10 PM [facility] [address] Office [phone] Fax [phone]  Notice: This dictation was prepared with Dragon dictation along with smaller phrase technology. Any transcriptional errors that result from this process are unintentional and may not be corrected upon review.  I spent 15  minutes examining this patient, reviewing medications, and using patient centered shared decision making involving her cardiac care.  Prior to her visit I spent greater than 20 minutes reviewing her past medical history,  medications, and prior cardiac tests.

## 2020-10-11 ENCOUNTER — Other Ambulatory Visit: Payer: Self-pay

## 2020-10-11 ENCOUNTER — Encounter: Payer: Self-pay | Admitting: General Practice

## 2020-10-11 ENCOUNTER — Ambulatory Visit: Payer: Medicare Other | Admitting: General Practice

## 2020-10-11 VITALS — BP 136/62 | HR 50 | Ht 65.0 in | Wt 158.4 lb

## 2020-10-11 DIAGNOSIS — Z79899 Other long term (current) drug therapy: Secondary | ICD-10-CM

## 2020-10-11 DIAGNOSIS — I4891 Unspecified atrial fibrillation: Secondary | ICD-10-CM

## 2020-10-11 DIAGNOSIS — I10 Essential (primary) hypertension: Secondary | ICD-10-CM

## 2020-10-11 DIAGNOSIS — E7800 Pure hypercholesterolemia, unspecified: Secondary | ICD-10-CM | POA: Diagnosis not present

## 2020-10-11 DIAGNOSIS — I2510 Atherosclerotic heart disease of native coronary artery without angina pectoris: Secondary | ICD-10-CM

## 2020-10-11 DIAGNOSIS — I6522 Occlusion and stenosis of left carotid artery: Secondary | ICD-10-CM

## 2020-10-11 MED ORDER — HYDROCHLOROTHIAZIDE 12.5 MG PO CAPS: 12.5000 mg | ORAL_CAPSULE | Freq: Every day | ORAL | 3 refills | Status: DC

## 2020-10-11 MED ORDER — AMLODIPINE BESYLATE 5 MG PO TABS: 2.5000 mg | ORAL_TABLET | Freq: Every day | ORAL | 1 refills | Status: DC

## 2020-10-11 NOTE — Patient Instructions (Addendum)
Medication Instructions:  DECREASE AMLODIPINE 2.5MG  (1/2 TAB)  START HYDROCHLOROTHIAZIDE 12.5MG  DAILY  *If you need a refill on your cardiac medications before your next appointment, please call your pharmacy*  Lab Work: BMET AND CBC IN 1 WEEK (10-18-2020) If you have labs (blood work) drawn today and your tests are completely normal, you will receive your results only by:  MyChart Message (if you have MyChart) OR A paper copy in the mail.  If you have any lab test that is abnormal or we need to change your treatment, we will call you to review the results. You may go to any [facility] that is convenient for you however, we do have a lab in our office that is able to assist you. You DO NOT need an appointment for our lab. The lab is open 8:00am and closes at 4:00pm. Lunch 12:45 - 1:45pm.  Special Instructions CONTINUE TO TAKE AND LOG YOUR BLOOD PRESSURE  PLEASE READ AND FOLLOW SALTY 6-ATTACHED-1,800 mg daily  Follow-Up: Your next appointment:  KEEP SCHEDULED APPOINTMENT  In Person with Peter Swaziland, MD    At [facility], you and your health needs are our priority.  As part of our continuing mission to provide you with exceptional heart care, we have created designated Provider Care Teams.  These Care Teams include your primary Cardiologist (physician) and Advanced Practice Providers (APPs -  Physician Assistants and Nurse Practitioners) who all work together to provide you with the care you need, when you need it.

## 2020-10-13 ENCOUNTER — Telehealth: Payer: Self-pay | Admitting: Cardiology

## 2020-10-13 NOTE — Telephone Encounter (Signed)
LEFT DETAILED MESSAGE FOR PATIENT.

## 2020-10-13 NOTE — Telephone Encounter (Signed)
Returned call to [employer] office.Patient will require a antibiotic due to past hip replacement.

## 2020-10-13 NOTE — Telephone Encounter (Incomplete)
° °  [facility] Pre-operative Risk Assessment    Request for surgical clearance:  1. What type of surgery is being performed? Teeth cleaning  2. When is this surgery scheduled? 10/13/20 patient in chair  3. What type of clearance is required (medical clearance vs. Pharmacy clearance to hold med vs. Both)? medical  4. Are there any medications that need to be held prior to surgery and how long? no  5. Practice name and name of physician performing surgery? Drs Talmadge Coventry   6. What is your office phone number [phone]   7.   What is your office fax number [phone]  8.   Anesthesia type (None, local, MAC, general) ? none   Debbie Cardenas 10/13/2020, 12:25 PM  ________________________________________________________________   (provider comments below) Needs to know if pre meds are needed and if so what?

## 2020-10-13 NOTE — Telephone Encounter (Signed)
Spoke to Rawlings with [facility]-she reports see is with patient now and checked her HR and it is 48, BP 130/60.   She reports patient has been checking her BP and HR herself at home and by looking at her log her HR has been in the 40s everyday the last week.   Yesterday BP 122/46.   Denies dizziness, lightheadedness, SOB.   Only reports fatigue with exertion.    Currently taking metoprolol 12.5 BID and amiodarone 200 daily.     OV 3/15 with Edd Fabian NP-HR 50, BP 136/62 OV 2/23 with Dr. Laneta Simmers- HR 50 BP 125/66   Advised since patient asymptomatic and HR readings similar to previous, no changes at this time.  Advised would route to NP to review.

## 2020-10-13 NOTE — Telephone Encounter (Signed)
Debbie Cardenas with [facility] is returning call.

## 2020-10-13 NOTE — Telephone Encounter (Signed)
STAT if HR is under 50 or over 120 (normal HR is 60-100 beats per minute)  1) What is your heart rate? 48  2) Do you have a log of your heart rate readings (document readings)? 47, 46, 50, 48, 47, 49, 46, 70  3) Do you have any other symptoms? Feels exhausted and fatigued at times but not everday. Especially if she is busy, does not feel like she is going to pass out. Denies any other symptoms.    Marisue Ivan, nurse with [facility] is with patient right now. She states that the patient's HR has been running mostly in the 40's with a few exceptions. Today it was 48.    Please call nurse and patient back.   Called traige 2x with no answer.

## 2020-10-14 ENCOUNTER — Other Ambulatory Visit: Payer: Self-pay | Admitting: Surgical

## 2020-10-18 LAB — BASIC METABOLIC PANEL
BUN/Creatinine Ratio: 21 (ref 12–28)
BUN: 20 mg/dL (ref 8–27)
CO2: 23 mmol/L (ref 20–29)
Calcium: 9.3 mg/dL (ref 8.7–10.3)
Chloride: 100 mmol/L (ref 96–106)
Creatinine, Ser: 0.95 mg/dL (ref 0.57–1.00)
Glucose: 87 mg/dL (ref 65–99)
Potassium: 3.7 mmol/L (ref 3.5–5.2)
Sodium: 140 mmol/L (ref 134–144)
eGFR: 63 mL/min/{1.73_m2} (ref >59)

## 2020-10-18 LAB — CBC
Hematocrit: 33.3 % — ABNORMAL LOW (ref 34.0–46.6)
Hemoglobin: 10.9 g/dL — ABNORMAL LOW (ref 11.1–15.9)
MCH: 30.1 pg (ref 26.6–33.0)
MCHC: 32.7 g/dL (ref 31.5–35.7)
MCV: 92 fL (ref 79–97)
Platelets: 223 10*3/uL (ref 150–450)
RBC: 3.62 x10E6/uL — ABNORMAL LOW (ref 3.77–5.28)
RDW: 13.3 % (ref 11.7–15.4)
WBC: 4.9 10*3/uL (ref 3.4–10.8)

## 2020-10-19 ENCOUNTER — Other Ambulatory Visit: Payer: Self-pay | Admitting: Surgical

## 2020-10-19 ENCOUNTER — Other Ambulatory Visit: Payer: Self-pay | Admitting: Cardiology

## 2020-10-21 ENCOUNTER — Other Ambulatory Visit: Payer: Self-pay | Admitting: Surgical

## 2020-10-22 ENCOUNTER — Other Ambulatory Visit: Payer: Self-pay | Admitting: Surgical

## 2020-10-22 ENCOUNTER — Other Ambulatory Visit: Payer: Self-pay | Admitting: Cardiology

## 2020-10-24 ENCOUNTER — Telehealth: Payer: Self-pay | Admitting: Cardiology

## 2020-10-24 ENCOUNTER — Other Ambulatory Visit: Payer: Self-pay

## 2020-10-24 MED ORDER — APIXABAN 5 MG PO TABS: 5.0000 mg | ORAL_TABLET | Freq: Two times a day (BID) | ORAL | 1 refills | Status: DC

## 2020-10-24 NOTE — Telephone Encounter (Signed)
Handled in a previous encounter

## 2020-10-24 NOTE — Telephone Encounter (Signed)
*  STAT* If patient is at the pharmacy, call can be transferred to refill team.   1. Which medications need to be refilled? (please list name of each medication and dose if known) apixaban (ELIQUIS) 5 MG TABS tablet  2. Which pharmacy/location (including street and city if local pharmacy) is medication to be sent to? [id number] [place], [place] - [address]  3. Do they need a 30 day or 90 day supply? 30 day   Patient states she is out of medication.

## 2020-10-26 ENCOUNTER — Other Ambulatory Visit: Payer: Self-pay | Admitting: Cardiology

## 2020-10-31 ENCOUNTER — Telehealth: Payer: Self-pay | Admitting: Cardiology

## 2020-10-31 NOTE — Telephone Encounter (Signed)
Spoke with pt, aware the eliquis will cause easy bruising. Several questions regarding medications and side effects answered.

## 2020-10-31 NOTE — Telephone Encounter (Signed)
Patient states she has been bruising badly, every time she hits something. She is wondering if it may be caused by a medication.

## 2020-11-07 ENCOUNTER — Telehealth (HOSPITAL_COMMUNITY): Payer: Self-pay

## 2020-11-07 NOTE — Telephone Encounter (Signed)
Cardiac Rehab Medication Review by a Pharmacist  Does the patient  feel that his/her medications are working for him/her?  yes  Has the patient been experiencing any side effects to the medications prescribed?  yes - the patient reports bruising with apixaban and endorses an episode of dizziness due to dehydration that hassince resolved but denies any falls   Does the patient measure his/her own blood pressure or blood glucose at home?  yes - the patient reports that their blood pressure readings are usually in the 120-134/50-60s  Does the patient have any problems obtaining medications due to transportation or finances?   no  Understanding of regimen: good Understanding of indications: good Potential of compliance: good    Pharmacist Intervention: I provided counseling on the side effects of apixaban, including when to seek additional care. I informed the patient that some bruising is normal, but blood in the urine, stool, and vomit and/or unprovoked bleeding is not normal and she should seek care if she ever experiences that. I also stated that if she ever falls and hits her head, she would need to go to the ER. The patient verbalized understanding.   Sanda Klein, PharmD, RPh  PGY-1 Pharmacy Resident 11/07/2020 5:45 PM

## 2020-11-11 ENCOUNTER — Other Ambulatory Visit: Payer: Self-pay | Admitting: Cardiology

## 2020-11-15 ENCOUNTER — Encounter (HOSPITAL_COMMUNITY)
Admission: RE | Admit: 2020-11-15 | Discharge: 2020-11-15 | Disposition: A | Discharge status: Home / Self Care | Payer: Medicare Other | Source: Ambulatory Visit | Attending: Cardiology | Admitting: Cardiology

## 2020-11-15 ENCOUNTER — Other Ambulatory Visit: Payer: Self-pay

## 2020-11-15 ENCOUNTER — Telehealth: Payer: Self-pay | Admitting: Medical

## 2020-11-15 ENCOUNTER — Encounter (HOSPITAL_COMMUNITY): Payer: Self-pay

## 2020-11-15 VITALS — BP 152/62 | HR 51 | Ht 62.75 in | Wt 159.6 lb

## 2020-11-15 DIAGNOSIS — Z951 Presence of aortocoronary bypass graft: Secondary | ICD-10-CM | POA: Insufficient documentation

## 2020-11-15 NOTE — Telephone Encounter (Signed)
   Received a call from [facility] with cardiac rehab with concerns for bradycardia. Patient's HR was in the 40s on arrival today for orientation. Additionally reports episodes of dizziness at home, mostly with position changes. No complaints of pre-syncope or syncope. On chart review, appears HR has been slow on the past several office visit. Favor holding metoprolol tartrate 12.5mg  BID for the time being. Patient instructed to keep a BP/HR log to bring to her follow-up visit with Dr. Swaziland 12/05/20. Patient instructed to notify the office if BP is persistently >140/80 as further medication adjustments may need to be considered (perhaps increasing amlodipine). Will route to Dr. Swaziland as Lorain Childes.  Beatriz Stallion, PA-C 11/15/20; 10:25 AM

## 2020-11-15 NOTE — Progress Notes (Signed)
Patient here for cardiac rehab orientation vital signs are as follows. BP 152/62 heart rate 46 by pulse ox. Sao2 98% on room air. Patient asymptomatic. Judy Pimple [facility] paged and notified. Dot Lanes talked with the patient over the phone. Mrs Duggin's said that sometimes she  feels lightheaded when changing positions but not when standing. Dot Lanes told Mrs Wiedemann over the phone to discontinue her metoprolol and to monitor her blood pressure at home. Mrs Burnette was instructed to call Dr Elvis Coil office if her blood pressure remain above 140/80. Dot Lanes said that Mrs Rosamond is okay to proceed with her walk test and exercise.Will fax today's ECG tracings to Dr. Elvis Coil office for Arbutus Ped, RN,BSN 11/15/2020 10:31 AM

## 2020-11-15 NOTE — Progress Notes (Signed)
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  acetaminophen (TYLENOL) 650 MG CR tablet, Take 1,300 mg by mouth 2 (two) times daily as needed for pain., Disp: , Rfl:  .  amiodarone (PACERONE) 200 MG tablet, TAKE 1 TABLET BY MOUTH TWICE A DAY, Disp: 60 tablet, Rfl: 10 .  amLODipine (NORVASC) 5 MG tablet, Take 0.5 tablets (2.5 mg total) by mouth daily., Disp: 45 tablet, Rfl: 1 .  apixaban (ELIQUIS) 5 MG TABS tablet, Take 1 tablet (5 mg total) by mouth 2 (two) times daily., Disp: 180 tablet, Rfl: 1 .  aspirin EC 81 MG tablet, Take 1 tablet (81 mg total) by mouth daily. Swallow whole., Disp: 30 tablet, Rfl: 11 .  Cholecalciferol (VITAMIN D) 50 MCG (2000 UT) tablet, Take 2,000 Units by mouth daily., Disp: , Rfl:  .  FEROCON capsule, TAKE 1 CAPSULE BY MOUTH 2 (TWO) TIMES DAILY AFTER A MEAL., Disp: 60 capsule, Rfl: 1 .  hydrochlorothiazide (MICROZIDE) 12.5 MG capsule, Take 1 capsule (12.5 mg total) by mouth daily., Disp: 90 capsule, Rfl: 3 .  MAGNESIUM OXIDE PO, Take 500 mg by mouth daily., Disp: , Rfl:  .  metoprolol tartrate (LOPRESSOR) 25 MG tablet, TAKE 1/2 TABLET BY MOUTH TWICE A DAY, Disp: 30 tablet, Rfl: 1 .  rosuvastatin (CRESTOR) 20 MG tablet, Take 1 tablet (20 mg total) by mouth daily., Disp: 90 tablet, Rfl: 3 .  traMADol (ULTRAM) 50 MG tablet, TAKE 1 TABLET (50 MG TOTAL) BY MOUTH EVERY SIX HOURS AS NEEDED FOR UP TO 7 DAYS FOR MODERATE PAIN. (Patient not taking: No sig  reported), Disp: 28 tablet, Rfl: 0  Current Facility-Administered Medications:  .  sodium chloride flush (NS) 0.9 % injection 3 mL, 3 mL, Intravenous, Q12H, Swazil[place], Peter M, MD  Past Medical History: Past Medical History:  Diagnosis Date  . Anxiety   . Arthritis    knees  . Closed fracture of neck of left femur (HCC) 08/15/2018  . Coronary artery disease   . Hyperlipidemia   . Hypertension   . Pneumonia     Tobacco Use: Social History   Tobacco Use  Smoking Status Former Smoker  . Quit date: 06/13/2014  . Years since quitting: 6.4  Smokeless Tobacco Never Used    Labs: Recent Review Flowsheet Data    Labs for ITP Cardiac and Pulmonary Rehab Latest Ref Rng & Units 08/15/2020 08/15/2020 08/15/2020 08/15/2020 09/13/2020   Cholestrol 100 - 199 mg/dL - - - - 409   LDLCALC 0 - 99 mg/dL - - - - 60   HDL >73 mg/dL - - - - 69   Trlycerides 0 - 149 mg/dL - - - - 67   Hemoglobin A1c 4.8 - 5.6 % - - - - -   PHART 7.350 - 7.450 - 7.405 7.354 7.346(L) -   PCO2ART 32.0 - 48.0 mmHg - 36.3 40.3 40.5 -   HCO3 20.0 - 28.0 mmol/L - 23.0 22.3 22.0 -   TCO2  22 - 32 mmol/L 27 24 23 23  -   ACIDBASEDEF 0.0 - 2.0 mmol/L - 2.0 3.0(H) 3.0(H) -   O2SAT % - 97.0 98.0 97.0 -      Capillary Blood Glucose: Lab Results  Component Value Date   GLUCAP 89 08/18/2020   GLUCAP 97 08/18/2020   GLUCAP 112 (H) 08/18/2020   GLUCAP 95 08/18/2020   GLUCAP 117 (H) 08/17/2020     Exercise Target Goals: Exercise Program Goal: Individual exercise prescription set using results from initial 6 min walk test and THRR while considering  patient's activity barriers and safety.   Exercise Prescription Goal: Starting with aerobic activity 30 plus minutes a day, 3 days per week for initial exercise prescription. Provide home exercise prescription and guidelines that participant acknowledges understanding prior to discharge.  Activity Barriers & Risk Stratification:  Activity Barriers & Cardiac Risk Stratification -  11/15/20 1010      Activity Barriers & Cardiac Risk Stratification   Activity Barriers Arthritis;Assistive Device;Left Hip Replacement;Balance Concerns;History of Falls;Other (comment)    Comments Arthritis in both knees. Per patient, needs knee replacement. Walking limited by knee pain.    Cardiac Risk Stratification High           6 Minute Walk:  6 Minute Walk    Row Name 11/15/20 1033         6 Minute Walk   Phase Initial     Distance 1350 feet     Walk Time 6 minutes     # of Rest Breaks 0     MPH 2.56     METS 2.56     RPE 13     Perceived Dyspnea  0     VO2 Peak 8.97     Symptoms No     Resting HR 51 bpm     Resting BP 152/62     Resting Oxygen Saturation  98 %     Exercise Oxygen Saturation  during 6 min walk 99 %     Max Ex. HR 72 bpm     Max Ex. BP 158/52     2 Minute Post BP 148/62            Oxygen Initial Assessment:   Oxygen Re-Evaluation:   Oxygen Discharge (Final Oxygen Re-Evaluation):   Initial Exercise Prescription:  Initial Exercise Prescription - 11/15/20 1000      Date of Initial Exercise RX and Referring Provider   Date 11/15/20    Referring Provider 11/17/20, Peter M, MD    Expected Discharge Date 01/13/21      NuStep   Level 2    SPM 1.6    Minutes 15    METs 2      Track   Laps 13    Minutes 15    METs 2.51      Prescription Details   Frequency (times per week) 3    Duration Progress to 30 minutes of continuous aerobic without signs/symptoms of physical distress      Intensity   THRR 40-80% of Max Heartrate 59-118    Ratings of Perceived Exertion 11-13    Perceived Dyspnea 0-4      Progression   Progression Continue to progress workloads to maintain intensity without signs/symptoms of physical distress.      Resistance Training   Training Prescription Yes    Weight 2 lbs    Reps 10-15           Perform Capillary Blood  Glucose checks as needed.  Exercise Prescription Changes:   Exercise  Comments:   Exercise Goals and Review:  Exercise Goals    Row Name 11/15/20 0938             Exercise Goals   Increase Physical Activity Yes       Intervention Provide advice, education, support and counseling about physical activity/exercise needs.;Develop an individualized exercise prescription for aerobic and resistive training based on initial evaluation findings, risk stratification, comorbidities and participant's personal goals.       Expected Outcomes Short Term: Attend rehab on a regular basis to increase amount of physical activity.;Long Term: Exercising regularly at least 3-5 days a week.;Long Term: Add in home exercise to make exercise part of routine and to increase amount of physical activity.       Increase Strength and Stamina Yes       Intervention Provide advice, education, support and counseling about physical activity/exercise needs.;Develop an individualized exercise prescription for aerobic and resistive training based on initial evaluation findings, risk stratification, comorbidities and participant's personal goals.       Expected Outcomes Short Term: Increase workloads from initial exercise prescription for resistance, speed, and METs.;Short Term: Perform resistance training exercises routinely during rehab and add in resistance training at home;Long Term: Improve cardiorespiratory fitness, muscular endurance and strength as measured by increased METs and functional capacity (6MWT)       Able to understand and use rate of perceived exertion (RPE) scale Yes       Intervention Provide education and explanation on how to use RPE scale       Expected Outcomes Short Term: Able to use RPE daily in rehab to express subjective intensity level;Long Term:  Able to use RPE to guide intensity level when exercising independently       Knowledge and understanding of Target Heart Rate Range (THRR) Yes       Intervention Provide education and explanation of THRR including how the  numbers were predicted and where they are located for reference       Expected Outcomes Short Term: Able to state/look up THRR;Long Term: Able to use THRR to govern intensity when exercising independently;Short Term: Able to use daily as guideline for intensity in rehab       Able to check pulse independently Yes       Intervention Provide education and demonstration on how to check pulse in carotid and radial arteries.;Review the importance of being able to check your own pulse for safety during independent exercise       Expected Outcomes Short Term: Able to explain why pulse checking is important during independent exercise;Long Term: Able to check pulse independently and accurately       Understanding of Exercise Prescription Yes       Intervention Provide education, explanation, and written materials on patient's individual exercise prescription       Expected Outcomes Short Term: Able to explain program exercise prescription;Long Term: Able to explain home exercise prescription to exercise independently              Exercise Goals Re-Evaluation :    Discharge Exercise Prescription (Final Exercise Prescription Changes):   Nutrition:  Target Goals: Understanding of nutrition guidelines, daily intake of sodium 1500mg , cholesterol 200mg , calories 30% from fat and 7% or less from saturated fats, daily to have 5 or more servings of fruits and vegetables.  Biometrics:  Pre Biometrics - 11/15/20 [id number]      Pre  Biometrics   Waist Circumference 35.5 inches    Hip Circumference 41 inches    Waist to Hip Ratio 0.87 %    Triceps Skinfold 18 mm    % Body Fat 37.8 %    Grip Strength 29 kg    Flexibility 14.5 in    Single Leg Stand 25.31 seconds            Nutrition Therapy Plan and Nutrition Goals:   Nutrition Assessments:  MEDIFICTS Score Key:  ?70 Need to make dietary changes   40-70 Heart Healthy Diet  ? 40 Therapeutic Level Cholesterol Diet   Picture Your Plate  Scores:  <40 Unhealthy dietary pattern with much room for improvement.  41-50 Dietary pattern unlikely to meet recommendations for good health and room for improvement.  51-60 More healthful dietary pattern, with some room for improvement.   >60 Healthy dietary pattern, although there may be some specific behaviors that could be improved.    Nutrition Goals Re-Evaluation:   Nutrition Goals Discharge (Final Nutrition Goals Re-Evaluation):   Psychosocial: Target Goals: Acknowledge presence or absence of significant depression and/or stress, maximize coping skills, provide positive support system. Participant is able to verbalize types and ability to use techniques and skills needed for reducing stress and depression.  Initial Review & Psychosocial Screening:  Initial Psych Review & Screening - 11/15/20 1328      Initial Review   Current issues with None Identified   Debbie Cardenas admits to having some blue days after surgery     Family Dynamics   Good Support System? Yes   Debbie Cardenas has her husband and three children for support     Barriers   Psychosocial barriers to participate in program The patient should benefit from training in stress management and relaxation.      Screening Interventions   Interventions Encouraged to exercise    Expected Outcomes Long Term Goal: Stressors or current issues are controlled or eliminated.           Quality of Life Scores:  Quality of Life - 11/15/20 1124      Quality of Life   Select Quality of Life      Quality of Life Scores   Health/Function Pre 27.43 %    Socioeconomic Pre 29 %    Psych/Spiritual Pre 28.29 %    Family Pre 27.6 %    GLOBAL Pre 27.92 %          Scores of 19 and below usually indicate a poorer quality of life in these areas.  A difference of  2-3 points is a clinically meaningful difference.  A difference of 2-3 points in the total score of the Quality of Life Index has been associated with significant improvement in  overall quality of life, self-image, physical symptoms, and general health in studies assessing change in quality of life.  PHQ-9: Recent Review Flowsheet Data    Depression screen [facility] 2/9 11/15/2020   Decreased Interest 0   Down, Depressed, Hopeless 0   PHQ - 2 Score 0     Interpretation of Total Score  Total Score Depression Severity:  1-4 = Minimal depression, 5-9 = Mild depression, 10-14 = Moderate depression, 15-19 = Moderately severe depression, 20-27 = Severe depression   Psychosocial Evaluation and Intervention:   Psychosocial Re-Evaluation:   Psychosocial Discharge (Final Psychosocial Re-Evaluation):   Vocational Rehabilitation: Provide vocational rehab assistance to qualifying candidates.   Vocational Rehab Evaluation & Intervention:  Vocational Rehab - 11/15/20 1332  Initial Vocational Rehab Evaluation & Intervention   Assessment shows need for Vocational Rehabilitation No   Debbie Cardenas is retired and does not need vocational rehab at this time          Education: Education Goals: Education classes will be provided on a weekly basis, covering required topics. Participant will state understanding/return demonstration of topics presented.  Learning Barriers/Preferences:  Learning Barriers/Preferences - 11/15/20 1333      Learning Barriers/Preferences   Learning Barriers Inability to learn new things;Exercise Concerns   Debbie Cardenas says that she feels dizzy at times.   Learning Preferences Skilled Demonstration;Video;Pictoral           Education Topics: Hypertension, Hypertension Reduction -Define heart disease and high blood pressure. Discus how high blood pressure affects the body and ways to reduce high blood pressure.   Exercise and Your Heart -Discuss why it is important to exercise, the FITT principles of exercise, normal and abnormal responses to exercise, and how to exercise safely.   Angina -Discuss definition of angina, causes of angina, treatment of  angina, and how to decrease risk of having angina.   Cardiac Medications -Review what the following cardiac medications are used for, how they affect the body, and side effects that may occur when taking the medications.  Medications include Aspirin, Beta blockers, calcium channel blockers, ACE Inhibitors, angiotensin receptor blockers, diuretics, digoxin, and antihyperlipidemics.   Congestive Heart Failure -Discuss the definition of CHF, how to live with CHF, the signs and symptoms of CHF, and how keep track of weight and sodium intake.   Heart Disease and Intimacy -Discus the effect sexual activity has on the heart, how changes occur during intimacy as we age, and safety during sexual activity.   Smoking Cessation / COPD -Discuss different methods to quit smoking, the health benefits of quitting smoking, and the definition of COPD.   Nutrition I: Fats -Discuss the types of cholesterol, what cholesterol does to the heart, and how cholesterol levels can be controlled.   Nutrition II: Labels -Discuss the different components of food labels and how to read food label   Heart Parts/Heart Disease and PAD -Discuss the anatomy of the heart, the pathway of blood circulation through the heart, and these are affected by heart disease.   Stress I: Signs and Symptoms -Discuss the causes of stress, how stress may lead to anxiety and depression, and ways to limit stress.   Stress II: Relaxation -Discuss different types of relaxation techniques to limit stress.   Warning Signs of Stroke / TIA -Discuss definition of a stroke, what the signs and symptoms are of a stroke, and how to identify when someone is having stroke.   Knowledge Questionnaire Score:  Knowledge Questionnaire Score - 11/15/20 1124      Knowledge Questionnaire Score   Pre Score 20/24           Core Components/Risk Factors/Patient Goals at Admission:  Personal Goals and Risk Factors at Admission - 11/15/20 0940       Core Components/Risk Factors/Patient Goals on Admission    Weight Management Yes;Weight Maintenance    Intervention Weight Management: Develop a combined nutrition and exercise program designed to reach desired caloric intake, while maintaining appropriate intake of nutrient and fiber, sodium and fats, and appropriate energy expenditure required for the weight goal.;Weight Management: Provide education and appropriate resources to help participant work on and attain dietary goals.    Expected Outcomes Weight Maintenance: Understanding of the daily nutrition guidelines, which includes 25-35% calories from  fat, 7% or less cal from saturated fats, less than  cholesterol, less than 1.5gm of sodium, & 5 or more servings of fruits and vegetables daily;Short Term: Continue to assess and modify interventions until short term weight is achieved;Long Term: Adherence to nutrition and physical activity/exercise program aimed toward attainment of established weight goal;Understanding recommendations for meals to include 15-35% energy as protein, 25-35% energy from fat, 35-60% energy from carbohydrates, less than  of dietary cholesterol, 20-35 gm of total fiber daily;Understanding of distribution of calorie intake throughout the day with the consumption of 4-5 meals/snacks    Hypertension Yes    Intervention Provide education on lifestyle modifcations including regular physical activity/exercise, weight management, moderate sodium restriction and increased consumption of fresh fruit, vegetables, and low fat dairy, alcohol moderation, and smoking cessation.;Monitor prescription use compliance.    Expected Outcomes Short Term: Continued assessment and intervention until BP is < 140/73mm HG in hypertensive participants. < 130/28mm HG in hypertensive participants with diabetes, heart failure or chronic kidney disease.;Long Term: Maintenance of blood pressure at goal levels.    Lipids Yes    Intervention  Provide education and support for participant on nutrition & aerobic/resistive exercise along with prescribed medications to achieve LDL 70mg , HDL >40mg .    Expected Outcomes Short Term: Participant states understanding of desired cholesterol values and is compliant with medications prescribed. Participant is following exercise prescription and nutrition guidelines.;Long Term: Cholesterol controlled with medications as prescribed, with individualized exercise RX and with personalized nutrition plan. Value goals: LDL < , HDL > 40 mg.           Core Components/Risk Factors/Patient Goals Review:    Core Components/Risk Factors/Patient Goals at Discharge (Final Review):    ITP Comments:  ITP Comments    Row Name 11/15/20 0938           ITP Comments Medical Director- Dr. Armanda Magic, MD              Comments: Debbie Cardenas attended orientation on 11/15/2020 to review rules and guidelines for program.  Completed 6 minute walk test, Intitial ITP, and exercise prescription.  VSS. Telemetry-[facility] [place] Foley with a first degree heart block . Please see previous documentation as resting heart rate known to be in the mid to upper 40's. Judy Pimple PAC discontinued Debbie Cardenas's metropolol. .  Asymptomatic. Safety measures and social distancing in place per CDC guidelines.Gladstone Lighter, RN,BSN 11/15/2020 1:39 PM

## 2020-11-21 ENCOUNTER — Other Ambulatory Visit: Payer: Self-pay

## 2020-11-21 ENCOUNTER — Encounter (HOSPITAL_COMMUNITY)
Admission: RE | Admit: 2020-11-21 | Discharge: 2020-11-21 | Disposition: A | Discharge status: Home / Self Care | Payer: Medicare Other | Source: Ambulatory Visit | Attending: Cardiology | Admitting: Cardiology

## 2020-11-21 DIAGNOSIS — Z951 Presence of aortocoronary bypass graft: Secondary | ICD-10-CM | POA: Diagnosis not present

## 2020-11-21 NOTE — Progress Notes (Signed)
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  acetaminophen (TYLENOL) 650 MG CR tablet, Take 1,300 mg by mouth 2 (two) times daily as needed for pain., Disp: , Rfl:  .  amiodarone (PACERONE) 200 MG tablet, TAKE 1 TABLET BY MOUTH TWICE A DAY, Disp: 60 tablet, Rfl: 10 .  amLODipine (NORVASC) 5 MG tablet, Take 0.5 tablets (2.5 mg total) by mouth daily., Disp: 45 tablet, Rfl: 1 .  apixaban (ELIQUIS) 5 MG TABS tablet, Take 1 tablet (5 mg total) by mouth 2 (two) times daily., Disp: 180 tablet, Rfl: 1 .  aspirin EC 81 MG tablet, Take 1 tablet (81 mg total) by mouth daily. Swallow whole., Disp: 30 tablet, Rfl: 11 .  Cholecalciferol (VITAMIN D) 50 MCG (2000 UT) tablet, Take 2,000 Units by mouth daily., Disp: , Rfl:  .  FEROCON capsule, TAKE 1 CAPSULE BY MOUTH 2 (TWO) TIMES DAILY AFTER A MEAL., Disp: 60 capsule, Rfl: 1 .  hydrochlorothiazide (MICROZIDE) 12.5 MG capsule, Take 1 capsule (12.5 mg total) by mouth daily., Disp: 90 capsule, Rfl: 3 .  MAGNESIUM OXIDE PO, Take 500 mg by mouth daily., Disp: , Rfl:  .  metoprolol tartrate (LOPRESSOR) 25 MG tablet, TAKE 1/2 TABLET BY MOUTH TWICE A DAY, Disp: 30 tablet, Rfl: 1 .  rosuvastatin (CRESTOR) 20 MG tablet, Take 1 tablet (20 mg total) by mouth daily., Disp: 90 tablet, Rfl: 3 .  traMADol (ULTRAM) 50 MG tablet, TAKE 1 TABLET (50 MG TOTAL) BY MOUTH EVERY SIX HOURS AS NEEDED FOR UP TO 7 DAYS FOR MODERATE PAIN. (Patient not taking: No sig  reported), Disp: 28 tablet, Rfl: 0  Current Facility-Administered Medications:  .  sodium chloride flush (NS) 0.9 % injection 3 mL, 3 mL, Intravenous, Q12H, [place], Peter M, MD  Past Medical History: Past Medical History:  Diagnosis Date  . Anxiety   . Arthritis    knees  . Closed fracture of neck of left femur (HCC) 08/15/2018  . Coronary artery disease   . Hyperlipidemia   . Hypertension   . Pneumonia     Tobacco Use: Social History   Tobacco Use  Smoking Status Former Smoker  . Quit date: 06/13/2014  . Years since quitting: 6.4  Smokeless Tobacco Never Used    Labs: Recent Review Flowsheet Data    Labs for ITP Cardiac and Pulmonary Rehab Latest Ref Rng & Units 08/15/2020 08/15/2020 08/15/2020 08/15/2020 09/13/2020   Cholestrol 100 - 199 mg/dL - - - - 409   LDLCALC 0 - 99 mg/dL - - - - 60   HDL >73 mg/dL - - - - 69   Trlycerides 0 - 149 mg/dL - - - - 67   Hemoglobin A1c 4.8 - 5.6 % - - - - -   PHART 7.350 - 7.450 - 7.405 7.354 7.346(L) -   PCO2ART 32.0 - 48.0 mmHg - 36.3 40.3 40.5 -   HCO3 20.0 - 28.0 mmol/L - 23.0 22.3 22.0 -   TCO2  22 - 32 mmol/L 27 24 23 23  -   ACIDBASEDEF 0.0 - 2.0 mmol/L - 2.0 3.0(H) 3.0(H) -   O2SAT % - 97.0 98.0 97.0 -      Capillary Blood Glucose: Lab Results  Component Value Date   GLUCAP 89 08/18/2020   GLUCAP 97 08/18/2020   GLUCAP 112 (H) 08/18/2020   GLUCAP 95 08/18/2020   GLUCAP 117 (H) 08/17/2020     Exercise Target Goals: Exercise Program Goal: Individual exercise prescription set using results from initial 6 min walk test and THRR while considering  patient's activity barriers and safety.   Exercise Prescription Goal: Starting with aerobic activity 30 plus minutes a day, 3 days per week for initial exercise prescription. Provide home exercise prescription and guidelines that participant acknowledges understanding prior to discharge.  Activity Barriers & Risk Stratification:  Activity Barriers & Cardiac Risk Stratification -  11/15/20 1010      Activity Barriers & Cardiac Risk Stratification   Activity Barriers Arthritis;Assistive Device;Left Hip Replacement;Balance Concerns;History of Falls;Other (comment)    Comments Arthritis in both knees. Per patient, needs knee replacement. Walking limited by knee pain.    Cardiac Risk Stratification High           6 Minute Walk:  6 Minute Walk    Row Name 11/15/20 1033         6 Minute Walk   Phase Initial     Distance 1350 feet     Walk Time 6 minutes     # of Rest Breaks 0     MPH 2.56     METS 2.56     RPE 13     Perceived Dyspnea  0     VO2 Peak 8.97     Symptoms No     Resting HR 51 bpm     Resting BP 152/62     Resting Oxygen Saturation  98 %     Exercise Oxygen Saturation  during 6 min walk 99 %     Max Ex. HR 72 bpm     Max Ex. BP 158/52     2 Minute Post BP 148/62            Oxygen Initial Assessment:   Oxygen Re-Evaluation:   Oxygen Discharge (Final Oxygen Re-Evaluation):   Initial Exercise Prescription:  Initial Exercise Prescription - 11/15/20 1000      Date of Initial Exercise RX and Referring Provider   Date 11/15/20    Referring Provider 11/17/20, Peter M, MD    Expected Discharge Date 01/13/21      NuStep   Level 2    SPM 1.6    Minutes 15    METs 2      Track   Laps 13    Minutes 15    METs 2.51      Prescription Details   Frequency (times per week) 3    Duration Progress to 30 minutes of continuous aerobic without signs/symptoms of physical distress      Intensity   THRR 40-80% of Max Heartrate 59-118    Ratings of Perceived Exertion 11-13    Perceived Dyspnea 0-4      Progression   Progression Continue to progress workloads to maintain intensity without signs/symptoms of physical distress.      Resistance Training   Training Prescription Yes    Weight 2 lbs    Reps 10-15           Perform Capillary Blood  Glucose checks as needed.  Exercise Prescription Changes:   Exercise Prescription  Changes    Row Name 11/21/20 1100             Response to Exercise   Blood Pressure (Admit) 138/60       Blood Pressure (Exercise) 156/56       Blood Pressure (Exit) 122/60       Heart Rate (Admit) 59 bpm       Heart Rate (Exercise) 94 bpm       Heart Rate (Exit) 59 bpm       Rating of Perceived Exertion (Exercise) 13       Symptoms none       Comments Off to a good start with exercise.       Duration Continue with 30 min of aerobic exercise without signs/symptoms of physical distress.       Intensity THRR unchanged               Progression   Progression Continue to progress workloads to maintain intensity without signs/symptoms of physical distress.       Average METs 2.7               Resistance Training   Training Prescription Yes       Weight 2 lbs       Reps 10-15       Time 10 Minutes               Interval Training   Interval Training No               NuStep   Level 2       SPM 85       Minutes 15       METs 2.5               Track   Laps 16       Minutes 15       METs 2.86              Exercise Comments:   Exercise Comments    Row Name 11/21/20 1206           Exercise Comments Patient tolerated first session of exercise well without symptoms.              Exercise Goals and Review:   Exercise Goals    Row Name 11/15/20 0938             Exercise Goals   Increase Physical Activity Yes       Intervention Provide advice, education, support and counseling about physical activity/exercise needs.;Develop an individualized exercise prescription for aerobic and resistive training based on initial evaluation findings, risk stratification, comorbidities and participant's personal goals.       Expected Outcomes Short Term: Attend rehab on a regular basis to increase amount of physical activity.;Long Term: Exercising regularly at least 3-5 days a week.;Long Term: Add in home exercise to make exercise part of routine and to increase amount of  physical activity.       Increase Strength and Stamina Yes       Intervention Provide advice, education, support and counseling about physical activity/exercise needs.;Develop an individualized exercise prescription for aerobic and resistive training based on initial evaluation findings, risk stratification, comorbidities and participant's personal goals.       Expected Outcomes Short Term: Increase workloads from initial exercise prescription for resistance, speed, and METs.;Short Term: Perform resistance training exercises  routinely during rehab and add in resistance training at home;Long Term: Improve cardiorespiratory fitness, muscular endurance and strength as measured by increased METs and functional capacity ( )       Able to understand and use rate of perceived exertion (RPE) scale Yes       Intervention Provide education and explanation on how to use RPE scale       Expected Outcomes Short Term: Able to use RPE daily in rehab to express subjective intensity level;Long Term:  Able to use RPE to guide intensity level when exercising independently       Knowledge and understanding of Target Heart Rate Range (THRR) Yes       Intervention Provide education and explanation of THRR including how the numbers were predicted and where they are located for reference       Expected Outcomes Short Term: Able to state/look up THRR;Long Term: Able to use THRR to govern intensity when exercising independently;Short Term: Able to use daily as guideline for intensity in rehab       Able to check pulse independently Yes       Intervention Provide education and demonstration on how to check pulse in carotid and radial arteries.;Review the importance of being able to check your own pulse for safety during independent exercise       Expected Outcomes Short Term: Able to explain why pulse checking is important during independent exercise;Long Term: Able to check pulse independently and accurately        Understanding of Exercise Prescription Yes       Intervention Provide education, explanation, and written materials on patient's individual exercise prescription       Expected Outcomes Short Term: Able to explain program exercise prescription;Long Term: Able to explain home exercise prescription to exercise independently              Exercise Goals Re-Evaluation :  Exercise Goals Re-Evaluation    Row Name 11/21/20 1206             Exercise Goal Re-Evaluation   Exercise Goals Review Increase Physical Activity;Able to understand and use rate of perceived exertion (RPE) scale       Comments Patient able to understand and use RPE scale appropriately.       Expected Outcomes Progress workloads as tolerated to help increase strength and stamina.               Discharge Exercise Prescription (Final Exercise Prescription Changes):  Exercise Prescription Changes - 11/21/20 1100      Response to Exercise   Blood Pressure (Admit) 138/60    Blood Pressure (Exercise) 156/56    Blood Pressure (Exit) 122/60    Heart Rate (Admit) 59 bpm    Heart Rate (Exercise) 94 bpm    Heart Rate (Exit) 59 bpm    Rating of Perceived Exertion (Exercise) 13    Symptoms none    Comments Off to a good start with exercise.    Duration Continue with 30 min of aerobic exercise without signs/symptoms of physical distress.    Intensity THRR unchanged      Progression   Progression Continue to progress workloads to maintain intensity without signs/symptoms of physical distress.    Average METs 2.7      Resistance Training   Training Prescription Yes    Weight 2 lbs    Reps 10-15    Time 10 Minutes      Interval Training   Interval Training No  NuStep   Level 2    SPM 85    Minutes 15    METs 2.5      Track   Laps 16    Minutes 15    METs 2.86           Nutrition:  Target Goals: Understanding of nutrition guidelines, daily intake of sodium 1500mg , cholesterol 200mg , calories 30%  from fat and 7% or less from saturated fats, daily to have 5 or more servings of fruits and vegetables.  Biometrics:  Pre Biometrics - 11/15/20 0937      Pre Biometrics   Waist Circumference 35.5 inches    Hip Circumference 41 inches    Waist to Hip Ratio 0.87 %    Triceps Skinfold 18 mm    % Body Fat 37.8 %    Grip Strength 29 kg    Flexibility 14.5 in    Single Leg Stand 25.31 seconds            Nutrition Therapy Plan and Nutrition Goals:  Nutrition Therapy & Goals - 11/22/20 0732      Nutrition Therapy   RD appointment deferred Yes           Nutrition Assessments:  MEDIFICTS Score Key:  ?70 Need to make dietary changes   40-70 Heart Healthy Diet  ? 40 Therapeutic Level Cholesterol Diet   Picture Your Plate Scores:  <16 Unhealthy dietary pattern with much room for improvement.  41-50 Dietary pattern unlikely to meet recommendations for good health and room for improvement.  51-60 More healthful dietary pattern, with some room for improvement.   >60 Healthy dietary pattern, although there may be some specific behaviors that could be improved.    Nutrition Goals Re-Evaluation:   Nutrition Goals Discharge (Final Nutrition Goals Re-Evaluation):   Psychosocial: Target Goals: Acknowledge presence or absence of significant depression and/or stress, maximize coping skills, provide positive support system. Participant is able to verbalize types and ability to use techniques and skills needed for reducing stress and depression.  Initial Review & Psychosocial Screening:  Initial Psych Review & Screening - 11/15/20 1328      Initial Review   Current issues with None Identified   [name] admits to having some blue days after surgery     Family Dynamics   Good Support System? Yes   [name] has her husband and three children for support     Barriers   Psychosocial barriers to participate in program The patient should benefit from training in stress management and  relaxation.      Screening Interventions   Interventions Encouraged to exercise    Expected Outcomes Long Term Goal: Stressors or current issues are controlled or eliminated.           Quality of Life Scores:  Quality of Life - 11/15/20 1124      Quality of Life   Select Quality of Life      Quality of Life Scores   Health/Function Pre 27.43 %    Socioeconomic Pre 29 %    Psych/Spiritual Pre 28.29 %    Family Pre 27.6 %    GLOBAL Pre 27.92 %          Scores of 19 and below usually indicate a poorer quality of life in these areas.  A difference of  2-3 points is a clinically meaningful difference.  A difference of 2-3 points in the total score of the Quality of Life Index has been associated with  significant improvement in overall quality of life, self-image, physical symptoms, and general health in studies assessing change in quality of life.  PHQ-9: Recent Review Flowsheet Data    Depression screen [facility] 11/15/2020   Decreased Interest 0   Down, Depressed, Hopeless 0   PHQ - 2 Score 0     Interpretation of Total Score  Total Score Depression Severity:  1-4 = Minimal depression, 5-9 = Mild depression, 10-14 = Moderate depression, 15-19 = Moderately severe depression, 20-27 = Severe depression   Psychosocial Evaluation and Intervention:   Psychosocial Re-Evaluation:  Psychosocial Re-Evaluation    Row Name 11/21/20 1218             Psychosocial Re-Evaluation   Current issues with None Identified       Interventions Encouraged to attend Cardiac Rehabilitation for the exercise       Continue Psychosocial Services  No Follow up required              Psychosocial Discharge (Final Psychosocial Re-Evaluation):  Psychosocial Re-Evaluation - 11/21/20 1218      Psychosocial Re-Evaluation   Current issues with None Identified    Interventions Encouraged to attend Cardiac Rehabilitation for the exercise    Continue Psychosocial Services  No Follow up required            Vocational Rehabilitation: Provide vocational rehab assistance to qualifying candidates.   Vocational Rehab Evaluation & Intervention:  Vocational Rehab - 11/15/20 1332      Initial Vocational Rehab Evaluation & Intervention   Assessment shows need for Vocational Rehabilitation No   [name] is retired and does not need vocational rehab at this time          Education: Education Goals: Education classes will be provided on a weekly basis, covering required topics. Participant will state understanding/return demonstration of topics presented.  Learning Barriers/Preferences:  Learning Barriers/Preferences - 11/15/20 1333      Learning Barriers/Preferences   Learning Barriers Inability to learn new things;Exercise Concerns   [name] says that she feels dizzy at times.   Learning Preferences Skilled Demonstration;Video;Pictoral           Education Topics: Hypertension, Hypertension Reduction -Define heart disease and high blood pressure. Discus how high blood pressure affects the body and ways to reduce high blood pressure.   Exercise and Your Heart -Discuss why it is important to exercise, the FITT principles of exercise, normal and abnormal responses to exercise, and how to exercise safely.   Angina -Discuss definition of angina, causes of angina, treatment of angina, and how to decrease risk of having angina.   Cardiac Medications -Review what the following cardiac medications are used for, how they affect the body, and side effects that may occur when taking the medications.  Medications include Aspirin, Beta blockers, calcium channel blockers, ACE Inhibitors, angiotensin receptor blockers, diuretics, digoxin, and antihyperlipidemics.   Congestive Heart Failure -Discuss the definition of CHF, how to live with CHF, the signs and symptoms of CHF, and how keep track of weight and sodium intake.   Heart Disease and Intimacy -Discus the effect sexual activity has on the  heart, how changes occur during intimacy as we age, and safety during sexual activity.   Smoking Cessation / COPD -Discuss different methods to quit smoking, the health benefits of quitting smoking, and the definition of COPD.   Nutrition I: Fats -Discuss the types of cholesterol, what cholesterol does to the heart, and how cholesterol levels can be controlled.  Nutrition II: Labels -Discuss the different components of food labels and how to read food label   Heart Parts/Heart Disease and PAD -Discuss the anatomy of the heart, the pathway of blood circulation through the heart, and these are affected by heart disease.   Stress I: Signs and Symptoms -Discuss the causes of stress, how stress may lead to anxiety and depression, and ways to limit stress.   Stress II: Relaxation -Discuss different types of relaxation techniques to limit stress.   Warning Signs of Stroke / TIA -Discuss definition of a stroke, what the signs and symptoms are of a stroke, and how to identify when someone is having stroke.   Knowledge Questionnaire Score:  Knowledge Questionnaire Score - 11/15/20 1124      Knowledge Questionnaire Score   Pre Score 20/24           Core Components/Risk Factors/Patient Goals at Admission:  Personal Goals and Risk Factors at Admission - 11/15/20 0940      Core Components/Risk Factors/Patient Goals on Admission    Weight Management Yes;Weight Maintenance    Intervention Weight Management: Develop a combined nutrition and exercise program designed to reach desired caloric intake, while maintaining appropriate intake of nutrient and fiber, sodium and fats, and appropriate energy expenditure required for the weight goal.;Weight Management: Provide education and appropriate resources to help participant work on and attain dietary goals.    Expected Outcomes Weight Maintenance: Understanding of the daily nutrition guidelines, which includes 25-35% calories from fat, 7%  or less cal from saturated fats, less than 200mg  cholesterol, less than 1.5gm of sodium, & 5 or more servings of fruits and vegetables daily;Short Term: Continue to assess and modify interventions until short term weight is achieved;Long Term: Adherence to nutrition and physical activity/exercise program aimed toward attainment of established weight goal;Understanding recommendations for meals to include 15-35% energy as protein, 25-35% energy from fat, 35-60% energy from carbohydrates, less than 200mg  of dietary cholesterol, 20-35 gm of total fiber daily;Understanding of distribution of calorie intake throughout the day with the consumption of 4-5 meals/snacks    Hypertension Yes    Intervention Provide education on lifestyle modifcations including regular physical activity/exercise, weight management, moderate sodium restriction and increased consumption of fresh fruit, vegetables, and low fat dairy, alcohol moderation, and smoking cessation.;Monitor prescription use compliance.    Expected Outcomes Short Term: Continued assessment and intervention until BP is < 140/29mm HG in hypertensive participants. < 130/2mm HG in hypertensive participants with diabetes, heart failure or chronic kidney disease.;Long Term: Maintenance of blood pressure at goal levels.    Lipids Yes    Intervention Provide education and support for participant on nutrition & aerobic/resistive exercise along with prescribed medications to achieve LDL 70mg , HDL >40mg .    Expected Outcomes Short Term: Participant states understanding of desired cholesterol values and is compliant with medications prescribed. Participant is following exercise prescription and nutrition guidelines.;Long Term: Cholesterol controlled with medications as prescribed, with individualized exercise RX and with personalized nutrition plan. Value goals: LDL < 70mg , HDL > 40 mg.           Core Components/Risk Factors/Patient Goals Review:   Goals and Risk  Factor Review    Row Name 11/21/20 1337             Core Components/Risk Factors/Patient Goals Review   Personal Goals Review Weight Management/Obesity;Hypertension;Lipids       Review [name] started exercise on 11/21/20 and did well with exercise.       Expected  Outcomes [name] will continue to participate in phase 2 cardiac rehab for exercise, nutrtion and lifestyle modifications              Core Components/Risk Factors/Patient Goals at Discharge (Final Review):   Goals and Risk Factor Review - 11/21/20 1337      Core Components/Risk Factors/Patient Goals Review   Personal Goals Review Weight Management/Obesity;Hypertension;Lipids    Review [name] started exercise on 11/21/20 and did well with exercise.    Expected Outcomes [name] will continue to participate in phase 2 cardiac rehab for exercise, nutrtion and lifestyle modifications           ITP Comments:  ITP Comments    Row Name 11/15/20 0938 11/21/20 1216         ITP Comments Medical Director- Dr. Armanda Magic, MD 30 Day ITP Review. [name] started exercise at cardiac rehab on 11/21/20 and did well with exercise.             Comments: See ITP comments.Gladstone Lighter, RN,BSN 11/22/2020 10:27 AM

## 2020-11-21 NOTE — Progress Notes (Signed)
Daily Session Note  Patient Details  Name: Debbie Cardenas MRN: [id number] Date of Birth: [birth year] Referring Provider:   Flowsheet Row CARDIAC REHAB PHASE II ORIENTATION from 11/15/2020 in [place]  Referring Provider Martinique, Peter M, MD      Encounter Date: 11/21/2020  Check In:  Session Check In - 11/21/20 1122      Check-In   Supervising physician immediately available to respond to emergencies [facility] immediately available    Physician(s) Dr. Tawanna Solo    Location [facility]    Staff Present Seward Carol, MS, ACSM CEP, Exercise Physiologist;Hope Holst, RN, BSN;Jetta Walker BS, ACSM EP-C, Exercise Physiologist;Portia Rollene Rotunda, RN, BSN;Carlette Carlton, RN, BSN    Virtual Visit No    Medication changes reported     No    Fall or balance concerns reported    No    Tobacco Cessation No Change    Warm-up and Cool-down Performed on first and last piece of equipment    Resistance Training Performed Yes    VAD Patient? No    PAD/SET Patient? No      Pain Assessment   Currently in Pain? No/denies    Pain Score 0-No pain    Multiple Pain Sites No           Capillary Blood Glucose: No results found for this or any previous visit (from the past 24 hour(s)).    Social History   Tobacco Use  Smoking Status Former Smoker  . Quit date: 06/13/2014  . Years since quitting: 6.4  Smokeless Tobacco Never Used    Goals Met:  Exercise tolerated well No report of cardiac concerns or symptoms Strength training completed today  Goals Unmet:  Not Applicable  Comments: [name] started cardiac rehab today.  Pt tolerated light exercise without difficulty. VSS, telemetry-Sinus Rhythm, asymptomatic.  Medication list reconciled. Pt denies barriers to medicaiton compliance.  PSYCHOSOCIAL ASSESSMENT:  PHQ-0. Pt exhibits positive coping skills, hopeful outlook with supportive family. No psychosocial needs identified at this  time, no psychosocial interventions necessary.   .   Pt oriented to exercise equipment and routine.    Understanding verbalized.  [name]'s heart rate was improved since metoprolol was discontinued. [name] also reported not feeling quite as lightheaded at home.Barnet Pall, RN,BSN 11/21/2020 1:26 PM   Dr. Fransico Him is Medical Director for Cardiac Rehab at [facility]

## 2020-11-23 ENCOUNTER — Other Ambulatory Visit: Payer: Self-pay

## 2020-11-23 ENCOUNTER — Encounter (HOSPITAL_COMMUNITY)
Admission: RE | Admit: 2020-11-23 | Discharge: 2020-11-23 | Disposition: A | Discharge status: Home / Self Care | Payer: Medicare Other | Source: Ambulatory Visit | Attending: Cardiology | Admitting: Cardiology

## 2020-11-23 DIAGNOSIS — Z951 Presence of aortocoronary bypass graft: Secondary | ICD-10-CM

## 2020-11-25 ENCOUNTER — Encounter (HOSPITAL_COMMUNITY): Payer: Medicare Other

## 2020-11-28 ENCOUNTER — Other Ambulatory Visit: Payer: Self-pay

## 2020-11-28 ENCOUNTER — Encounter (HOSPITAL_COMMUNITY)
Admission: RE | Admit: 2020-11-28 | Discharge: 2020-11-28 | Disposition: A | Discharge status: Home / Self Care | Payer: Medicare Other | Source: Ambulatory Visit | Attending: Cardiology | Admitting: Cardiology

## 2020-11-28 DIAGNOSIS — Z951 Presence of aortocoronary bypass graft: Secondary | ICD-10-CM | POA: Diagnosis not present

## 2020-11-30 ENCOUNTER — Encounter (HOSPITAL_COMMUNITY)
Admission: RE | Admit: 2020-11-30 | Discharge: 2020-11-30 | Disposition: A | Discharge status: Home / Self Care | Payer: Medicare Other | Source: Ambulatory Visit | Attending: Cardiology | Admitting: Cardiology

## 2020-11-30 ENCOUNTER — Other Ambulatory Visit: Payer: Self-pay

## 2020-11-30 DIAGNOSIS — Z951 Presence of aortocoronary bypass graft: Secondary | ICD-10-CM

## 2020-11-30 NOTE — Progress Notes (Signed)
Reviewed home exercise guidelines with patient including endpoints, temperature precautions, target heart rate and rate of perceived exertion. Patient plans to walk as her mode of home exercise. Patient will walk 15 minutes 1-2 days/week with a goal of increasing to 30 minutes 2 days/week in addition to exercise at cardiac rehab. Patient voices understanding of instructions given.  Artist Pais, MS, ACSM CEP

## 2020-11-30 NOTE — Progress Notes (Signed)
Debbie Cardenas 75 y.o. female Nutrition Note  Diagnosis:CABGx4  Past Medical History:  Diagnosis Date  . Anxiety   . Arthritis    knees  . Closed fracture of neck of left femur (HCC) 08/15/2018  . Coronary artery disease   . Hyperlipidemia   . Hypertension   . Pneumonia      Medications reviewed.   Current Outpatient Medications:  .  acetaminophen (TYLENOL) 650 MG CR tablet, Take 1,300 mg by mouth 2 (two) times daily as needed for pain., Disp: , Rfl:  .  amiodarone (PACERONE) 200 MG tablet, TAKE 1 TABLET BY MOUTH TWICE A DAY, Disp: 60 tablet, Rfl: 10 .  amLODipine (NORVASC) 5 MG tablet, Take 0.5 tablets (2.5 mg total) by mouth daily., Disp: 45 tablet, Rfl: 1 .  apixaban (ELIQUIS) 5 MG TABS tablet, Take 1 tablet (5 mg total) by mouth 2 (two) times daily., Disp: 180 tablet, Rfl: 1 .  aspirin EC 81 MG tablet, Take 1 tablet (81 mg total) by mouth daily. Swallow whole., Disp: 30 tablet, Rfl: 11 .  Cholecalciferol (VITAMIN D) 50 MCG (2000 UT) tablet, Take 2,000 Units by mouth daily., Disp: , Rfl:  .  FEROCON capsule, TAKE 1 CAPSULE BY MOUTH 2 (TWO) TIMES DAILY AFTER A MEAL., Disp: 60 capsule, Rfl: 1 .  hydrochlorothiazide (MICROZIDE) 12.5 MG capsule, Take 1 capsule (12.5 mg total) by mouth daily., Disp: 90 capsule, Rfl: 3 .  MAGNESIUM OXIDE PO, Take 500 mg by mouth daily., Disp: , Rfl:  .  metoprolol tartrate (LOPRESSOR) 25 MG tablet, TAKE 1/2 TABLET BY MOUTH TWICE A DAY, Disp: 30 tablet, Rfl: 1 .  rosuvastatin (CRESTOR) 20 MG tablet, Take 1 tablet (20 mg total) by mouth daily., Disp: 90 tablet, Rfl: 3 .  traMADol (ULTRAM) 50 MG tablet, TAKE 1 TABLET (50 MG TOTAL) BY MOUTH EVERY SIX HOURS AS NEEDED FOR UP TO 7 DAYS FOR MODERATE PAIN. (Patient not taking: No sig reported), Disp: 28 tablet, Rfl: 0  Current Facility-Administered Medications:  .  sodium chloride flush (NS) 0.9 % injection 3 mL, 3 mL, Intravenous, Q12H, [place], Peter M, MD   Ht Readings from Last 1 Encounters:  11/15/20  5' 2.75" (1.594 m)     Wt Readings from Last 3 Encounters:  11/15/20 159 lb 9.8 oz (72.4 kg)  10/11/20 158 lb 6.4 oz (71.8 kg)  09/21/20 159 lb 12.8 oz (72.5 kg)     There is no height or weight on file to calculate BMI.   Social History   Tobacco Use  Smoking Status Former Smoker  . Quit date: 06/13/2014  . Years since quitting: 6.4  Smokeless Tobacco Never Used     Lab Results  Component Value Date   CHOL 143 09/13/2020   Lab Results  Component Value Date   HDL 69 09/13/2020   Lab Results  Component Value Date   LDLCALC 60 09/13/2020   Lab Results  Component Value Date   TRIG 67 09/13/2020     Lab Results  Component Value Date   HGBA1C 4.6 (L) 08/11/2020     CBG (last 3)  No results for input(s): GLUCAP in the last 72 hours.   Nutrition Note  Spoke with pt. Nutrition Plan and Nutrition Survey goals reviewed with pt.  Pt is interested in maintaining weight and learning to read food labels.  Reviewed lipid panel with pt.  She loves vegetables and salads. She often eats meals without protein. She had a poor appet Per  discussion, pt does not use canned/convenience foods often. Pt does add salt to food. Pt does not eat out frequently. She avoids dairy.  Diet recall: Breakfast: boiled egg, white toast with jelly Lunch: none Dinner: Salad with veggies and 1000 island dressing, sometimes she adds chicken Snack: pb&J on white bread - sometimes she wakes up hungry in the middle of the night and has this Fluids: mostly water, some sweet tea  Pt expressed understanding of the information reviewed.  Nutrition Diagnosis ? Food-and nutrition-related knowledge deficit related to lack of exposure to information as related to diagnosis of: ? CVD ?  Nutrition Intervention ? Pt's individual nutrition plan reviewed with pt. ? Benefits of adopting Heart Healthy diet discussed when Picture Your Plate reviewed. ? Continue client-centered nutrition education by RD, as  part of interdisciplinary care.  Goal(s) ? Pt to build a healthy plate including vegetables, fruits, whole grains, and low-fat dairy products in a heart healthy meal plan. ? Pt to learn to read food labels to make heart healthy choices in the grocery store  Plan:   Will provide client-centered nutrition education as part of interdisciplinary care  Monitor and evaluate progress toward nutrition goal with team.   Andrey Campanile, MS, RDN, LDN

## 2020-12-01 NOTE — Progress Notes (Signed)
Cardiology Office Note   Date:  12/05/2020   ID:  Debbie Cardenas, DOB 08-19-1945, MRN [id number]  PCP:  Porfirio Oar, PA  Cardiologist:   Tinlee Navarrette [place], MD   Chief Complaint  Patient presents with  . Atrial Fibrillation  . Coronary Artery Disease      History of Present Illness: Debbie Cardenas is a 75 y.o. female who presents for follow up of Afib and CAD. She has a PMH of hypertension, hyperlipidemia, degenerative arthritis of both knees, and a family history of coronary artery disease. She is a former smoker. She underwent coronary CTA which showed a calcium score over 1000 and stenosis that was flow-limiting in the RCA and LAD. It was felt to be high risk.She had a cardiac event monitor which showed normal sinus rhythm. She underwent cardiac catheterization 07/08/2020 which showed 70% mid-distal left main stenosis. 50% ostial-proximal LAD stenosis and 70% proximal mid LAD stenosis, moderate size diagonal branch was noted off the area of stenosis, first marginal was noted to have 50% stenosis, ostium of RCA had 90% stenosis, and LV EF was 55-60% with normal LVEDP.   She discussed coronary artery bypass graft with Dr. Laneta Simmers. Her preoperative carotid duplex showed no significant right ICA stenosis and 40-59% left ICA stenosis. She underwent successful CABG x 4 on 08/05/20. She was discharged 08/24/2020. She developed postoperative atrial fibrillation on day 5. She was discharged on Eliquis and amiodarone. When seen for follow up she was in NSR.   She contacted the nurse triage line on 09/12/2020 and reported that home health nurse had taken her blood pressure.  Her readings were 190/80 and 210/80.  Her pulse was 48 and 54.  Amlodipine 5 mg was added to her medication regimen.  She was instructed to avoid sodium. On follow up her BP was improved. She started Cardiac Rehab in May.   On follow up today she is doing well. Reports Cardiac Rehab is going well. BP is well  controlled. HR is in the 40s most of the time. No anginal pain, dyspnea or palpitations. Can hear her heart beat when she lies on left side.     Past Medical History:  Diagnosis Date  . Anxiety   . Arthritis    knees  . Closed fracture of neck of left femur (HCC) 08/15/2018  . Coronary artery disease   . Hyperlipidemia   . Hypertension   . Pneumonia     Past Surgical History:  Procedure Laterality Date  . APPENDECTOMY    . CARDIAC CATHETERIZATION    . CATARACT EXTRACTION, BILATERAL    . CHOLECYSTECTOMY    . CORONARY ARTERY BYPASS GRAFT N/A 08/15/2020   Procedure: CORONARY ARTERY BYPASS GRAFTING (CABG)X4. USING BILATERAL MAMMARY ARTERIES AND RIGHT GREATER SAPHENOUS VEIN HARVESTED ENDOSCOPICALLY.;  Surgeon: Alleen Borne, MD;  Location: [facility] OR;  Service: Open Heart Surgery;  Laterality: N/A;  . KNEE SURGERY    . LEFT HEART CATH AND CORONARY ANGIOGRAPHY N/A 07/27/2020   Procedure: LEFT HEART CATH AND CORONARY ANGIOGRAPHY;  Surgeon: [place], Keeghan Bialy M, MD;  Location: [facility] INVASIVE CV LAB;  Service: Cardiovascular;  Laterality: N/A;  . TEE WITHOUT CARDIOVERSION N/A 08/15/2020   Procedure: TRANSESOPHAGEAL ECHOCARDIOGRAM (TEE);  Surgeon: Alleen Borne, MD;  Location: [facility] OR;  Service: Open Heart Surgery;  Laterality: N/A;  . TOTAL HIP ARTHROPLASTY Left 08/15/2018   Procedure: TOTAL HIP ARTHROPLASTY ANTERIOR APPROACH;  Surgeon: Kathryne Hitch, MD;  Location: [facility] ORS;  Service: Orthopedics;  Laterality: Left;     Current Outpatient Medications  Medication Sig Dispense Refill  . acetaminophen (TYLENOL) 650 MG CR tablet Take 1,300 mg by mouth 2 (two) times daily as needed for pain.    Marland Kitchen. amLODipine (NORVASC) 5 MG tablet Take 0.5 tablets (2.5 mg total) by mouth daily. 45 tablet 1  . aspirin EC 81 MG tablet Take 1 tablet (81 mg total) by mouth daily. Swallow whole. 30 tablet 11  . Cholecalciferol (VITAMIN D) 50 MCG (2000 UT) tablet Take 2,000 Units by mouth daily.    . FEROCON capsule TAKE  1 CAPSULE BY MOUTH 2 (TWO) TIMES DAILY AFTER A MEAL. 60 capsule 1  . hydrochlorothiazide (MICROZIDE) 12.5 MG capsule Take 1 capsule (12.5 mg total) by mouth daily. 90 capsule 3  . MAGNESIUM OXIDE PO Take 500 mg by mouth daily.    . rosuvastatin (CRESTOR) 20 MG tablet Take 1 tablet (20 mg total) by mouth daily. 90 tablet 3   Current Facility-Administered Medications  Medication Dose Route Frequency Provider Last Rate Last Admin  . sodium chloride flush (NS) 0.9 % injection 3 mL  3 mL Intravenous Q12H [place], Oliviya Gilkison M, MD        Allergies:   Ephedrine and Ace inhibitors    Social History:  The patient  reports that she quit smoking about 6 years ago. She has never used smokeless tobacco. She reports previous alcohol use. She reports that she does not use drugs.   Family History:  The patient's family history includes Cancer in her sister; Heart attack in her brother, brother, and father; Heart disease in her brother, brother, sister, and sister.    ROS:  Please see the history of present illness.   Otherwise, review of systems are positive for none.   All other systems are reviewed and negative.    PHYSICAL EXAM: VS:  BP 138/72   Pulse (!) 57   Ht 5\' 5"  (1.651 m)   Wt 157 lb (71.2 kg)   SpO2 97%   BMI 26.13 kg/m  , BMI Body mass index is 26.13 kg/m. GEN: Well nourished, well developed, in no acute distress  HEENT: normal  Neck: no JVD, carotid bruits, or masses Cardiac: RRR; no murmurs, rubs, or gallops,no edema  Respiratory:  clear to auscultation bilaterally, normal work of breathing GI: soft, nontender, nondistended, + BS MS: no deformity or atrophy  Skin: warm and dry, no rash Neuro:  Strength and sensation are intact Psych: euthymic mood, full affect   EKG:  EKG is not ordered today. The ekg ordered today demonstrates N/A   Recent Labs: 08/22/2020: Magnesium 2.0 09/13/2020: ALT 6 10/18/2020: BUN 20; Creatinine, Ser 0.95; Hemoglobin 10.9; Platelets 223; Potassium 3.7;  Sodium 140    Lipid Panel    Component Value Date/Time   CHOL 143 09/13/2020 0946   TRIG 67 09/13/2020 0946   HDL 69 09/13/2020 0946   CHOLHDL 2.1 09/13/2020 0946   CHOLHDL 4.0 08/15/2018 1502   VLDL 12 08/15/2018 1502   LDLCALC 60 09/13/2020 0946      Wt Readings from Last 3 Encounters:  12/05/20 157 lb (71.2 kg)  11/15/20 159 lb 9.8 oz (72.4 kg)  10/11/20 158 lb 6.4 oz (71.8 kg)      Other studies Reviewed: Additional studies/ records that were reviewed today include:   Echocardiogram 08/16/18  Study Conclusions   - Left ventricle: The cavity size was normal. There was mild  concentric hypertrophy. Systolic function was normal. The  estimated ejection fraction was in the range of 60% to 65%. Wall  motion was normal; there were no regional wall motion  abnormalities. Doppler parameters are consistent with abnormal  left ventricular relaxation (grade 1 diastolic dysfunction).  There was no evidence of elevated ventricular filling pressure by  Doppler parameters.  - Aortic valve: Valve area (VTI): 2.27 cm^2. Valve area (Vmax): 2.1  cm^2. Valve area (Vmean): 2.24 cm^2.  - Mitral valve: Valve area by pressure half-time: 2.44 cm^2.  - Right ventricle: Systolic function was normal.  - Right atrium: The atrium was normal in size.  - Tricuspid valve: There was no regurgitation.  - Pulmonary arteries: Systolic pressure was within the normal  range.  - Inferior vena cava: The vessel was normal in size.  - Pericardium, extracardiac: There was no pericardial effusion.  Carotid Dopplers 08/11/2018  Summary:  Right Carotid: The extracranial vessels were near-normal with only minimal  wall  thickening or plaque.   Left Carotid: Velocities in the left ICA are consistent with a 40-59%  stenosis.  Vertebrals: Right vertebral artery demonstrates antegrade flow. Left  vertebral  - dampened.  Subclavians: Normal flow  hemodynamics were seen in bilateral subclavian  arteries.   Right ABI: Resting right ankle-brachial index is within normal range. No  evidence of significant right lower extremity arterial disease.  Left ABI: Resting left ankle-brachial index is within normal range. No  evidence of significant left lower extremity arterial disease.  Right Upper Extremity: Doppler waveforms remain within normal limits with  right radial compression. Doppler waveforms decrease 50% with right ulnar  compression.  Left Upper Extremity: Doppler waveforms remain within normal limits with  left radial compression. Doppler waveform obliterate with left ulnar  compression.   Cardiac cath 07/27/20:  LEFT HEART CATH AND CORONARY ANGIOGRAPHY    Conclusion    Mid LM to Dist LM lesion is 70% stenosed.  Ost LAD to Prox LAD lesion is 50% stenosed.  Prox LAD to Mid LAD lesion is 70% stenosed.  1st Mrg lesion is 50% stenosed.  Dist Cx lesion is 30% stenosed with 30% stenosed side branch in LPAV.  Ost RCA to Prox RCA lesion is 90% stenosed.  The left ventricular systolic function is normal.  LV end diastolic pressure is normal.  The left ventricular ejection fraction is 55-65% by visual estimate.   1. 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 Normal LV function 3. Normal LVEDP  Plan: patient has high risk anatomy that is not suitable for PCI. Recommend CT surgery evaluation for CABG.   Diagnostic Dominance: Co-dominant    Intervention    ASSESSMENT AND PLAN:  1. Coronary artery disease- Status post CABG x4 by Dr. Estella Husk 08/15/2020.   Continueaspirin,  Rosuvastatin No beta blocker due to bradycardia.  Heart healthy low-sodium diet Increase physical activity as tolerated  2. Atrial fibrillation- post op CABG  Postop day 5 started on amiodarone and Eliquis. No recurrence Will discontinue amiodarone and Eliquis today.    3. Hyperlipidemia-09/13/2020: Cholesterol, Total 143; HDL 69; LDL Chol Calc (NIH) 60; Triglycerides 67 Continuerosuvastatin, aspirin  4. Carotid artery stenosis-carotid Doppler showed normal right carotid artery with left 40-59% carotid stenosis. Continuerosuvastatin, aspirin Repeat carotid Dopplers 1/23  5. HTN - controlled.   6. Post op anemia- will reduce iron to once a day.    Current medicines are reviewed at length with the patient today.  The patient does not have concerns regarding  medicines.  The following changes have been made:  Stop amiodarone and Eliquis. Reduce iron to once a day  Labs/ tests ordered today include:  No orders of the defined types were placed in this encounter.    Disposition:   FU with me in 6 months with labs.  Signed, Mialani Reicks [place], MD  12/05/2020 9:46 AM    [facility] [address], [place], [place], [address] Phone [phone], Fax [phone]

## 2020-12-02 ENCOUNTER — Other Ambulatory Visit: Payer: Self-pay

## 2020-12-02 ENCOUNTER — Encounter (HOSPITAL_COMMUNITY)
Admission: RE | Admit: 2020-12-02 | Discharge: 2020-12-02 | Disposition: A | Discharge status: Home / Self Care | Payer: Medicare Other | Source: Ambulatory Visit | Attending: Cardiology | Admitting: Cardiology

## 2020-12-02 DIAGNOSIS — Z951 Presence of aortocoronary bypass graft: Secondary | ICD-10-CM

## 2020-12-02 NOTE — Progress Notes (Signed)
Nutrition Note: Follow up   Spoke with pt. Reviewed label reading education. Provided individualized recommendations for increasing nutrient density and increasing diet quality. She has tried adding fruit during the day as a snack. She is trying to eat a balanced evening meal. She struggles to sit down for a meal d/t nervous energy. She will try to plate her meals and sit outside to enjoy food/eat balanced meals.  Provided label reading tip handout. Pt verbalized understanding.  Nutrition Diagnosis   Food-and nutrition-related knowledge deficit related to lack of exposure to information as related to diagnosis of: ? CVD ?  Nutrition Intervention   Pt's individual nutrition plan reviewed with pt.  Benefits of adopting Heart Healthy diet discussed when Picture Your Plate reviewed.  Continue client-centered nutrition education by RD, as part of interdisciplinary care.  Goal(s)  Pt to build a healthy plate including vegetables, fruits, whole grains, and low-fat dairy products in a heart healthy meal plan.  Pt to learn to read food labels to make heart healthy choices in the grocery store  Plan:  Will provide client-centered nutrition education as part of interdisciplinary care  Monitor and evaluate progress toward nutrition goal with team.   Andrey Campanile, MS, RDN, LDN

## 2020-12-05 ENCOUNTER — Other Ambulatory Visit: Payer: Self-pay | Admitting: Cardiology

## 2020-12-05 ENCOUNTER — Encounter (HOSPITAL_COMMUNITY)
Admission: RE | Admit: 2020-12-05 | Discharge: 2020-12-05 | Disposition: A | Discharge status: Home / Self Care | Payer: Medicare Other | Source: Ambulatory Visit | Attending: Cardiology | Admitting: Cardiology

## 2020-12-05 ENCOUNTER — Encounter: Payer: Self-pay | Admitting: Cardiology

## 2020-12-05 ENCOUNTER — Other Ambulatory Visit: Payer: Self-pay

## 2020-12-05 ENCOUNTER — Ambulatory Visit (INDEPENDENT_AMBULATORY_CARE_PROVIDER_SITE_OTHER): Payer: Medicare Other | Admitting: Cardiology

## 2020-12-05 VITALS — BP 138/72 | HR 57 | Ht 65.0 in | Wt 157.0 lb

## 2020-12-05 DIAGNOSIS — Z951 Presence of aortocoronary bypass graft: Secondary | ICD-10-CM | POA: Diagnosis not present

## 2020-12-05 DIAGNOSIS — I10 Essential (primary) hypertension: Secondary | ICD-10-CM

## 2020-12-05 DIAGNOSIS — I6522 Occlusion and stenosis of left carotid artery: Secondary | ICD-10-CM | POA: Diagnosis not present

## 2020-12-05 DIAGNOSIS — I4891 Unspecified atrial fibrillation: Secondary | ICD-10-CM | POA: Diagnosis not present

## 2020-12-05 DIAGNOSIS — I2510 Atherosclerotic heart disease of native coronary artery without angina pectoris: Secondary | ICD-10-CM | POA: Diagnosis not present

## 2020-12-05 DIAGNOSIS — E7800 Pure hypercholesterolemia, unspecified: Secondary | ICD-10-CM

## 2020-12-05 MED ORDER — FEROCON PO CAPS: 1.0000 | ORAL_CAPSULE | Freq: Every day | ORAL | 1 refills | Status: DC

## 2020-12-05 NOTE — Addendum Note (Signed)
Addended by: Neoma Laming on: 12/05/2020 09:52 AM   Modules accepted: Orders

## 2020-12-05 NOTE — Patient Instructions (Addendum)
Stop taking amiodarone and Eliquis  You can reduce iron to once a day   Follow up in 6 months

## 2020-12-07 ENCOUNTER — Other Ambulatory Visit: Payer: Self-pay

## 2020-12-07 ENCOUNTER — Encounter (HOSPITAL_COMMUNITY)
Admission: RE | Admit: 2020-12-07 | Discharge: 2020-12-07 | Disposition: A | Discharge status: Home / Self Care | Payer: Medicare Other | Source: Ambulatory Visit | Attending: Cardiology | Admitting: Cardiology

## 2020-12-07 DIAGNOSIS — Z951 Presence of aortocoronary bypass graft: Secondary | ICD-10-CM

## 2020-12-09 ENCOUNTER — Other Ambulatory Visit: Payer: Self-pay

## 2020-12-09 ENCOUNTER — Encounter (HOSPITAL_COMMUNITY)
Admission: RE | Admit: 2020-12-09 | Discharge: 2020-12-09 | Disposition: A | Discharge status: Home / Self Care | Payer: Medicare Other | Source: Ambulatory Visit | Attending: Cardiology | Admitting: Cardiology

## 2020-12-09 DIAGNOSIS — Z951 Presence of aortocoronary bypass graft: Secondary | ICD-10-CM | POA: Diagnosis not present

## 2020-12-12 ENCOUNTER — Encounter (HOSPITAL_COMMUNITY): Payer: Medicare Other

## 2020-12-12 ENCOUNTER — Telehealth (HOSPITAL_COMMUNITY): Payer: Self-pay | Admitting: Physician Assistant

## 2020-12-12 ENCOUNTER — Telehealth: Payer: Self-pay

## 2020-12-12 NOTE — Telephone Encounter (Signed)
Noted  

## 2020-12-12 NOTE — Telephone Encounter (Signed)
Patient called she would like to get pre approval process started for gel injections, patient last received injections 07/04/2020 call back:[phone]

## 2020-12-14 ENCOUNTER — Other Ambulatory Visit: Payer: Self-pay

## 2020-12-14 ENCOUNTER — Encounter (HOSPITAL_COMMUNITY)
Admission: RE | Admit: 2020-12-14 | Discharge: 2020-12-14 | Disposition: A | Discharge status: Home / Self Care | Payer: Medicare Other | Source: Ambulatory Visit | Attending: Cardiology | Admitting: Cardiology

## 2020-12-14 DIAGNOSIS — Z951 Presence of aortocoronary bypass graft: Secondary | ICD-10-CM | POA: Diagnosis not present

## 2020-12-16 ENCOUNTER — Other Ambulatory Visit: Payer: Self-pay

## 2020-12-16 ENCOUNTER — Encounter (HOSPITAL_COMMUNITY)
Admission: RE | Admit: 2020-12-16 | Discharge: 2020-12-16 | Disposition: A | Discharge status: Home / Self Care | Payer: Medicare Other | Source: Ambulatory Visit | Attending: Cardiology | Admitting: Cardiology

## 2020-12-16 ENCOUNTER — Telehealth: Payer: Self-pay

## 2020-12-16 DIAGNOSIS — Z951 Presence of aortocoronary bypass graft: Secondary | ICD-10-CM

## 2020-12-16 NOTE — Telephone Encounter (Signed)
VOB has been submitted for synviscOne, bilateral knee. Pending BV. 

## 2020-12-16 NOTE — Telephone Encounter (Signed)
Patient's next available gel injection would need to be after 01/02/2021

## 2020-12-19 ENCOUNTER — Other Ambulatory Visit: Payer: Self-pay

## 2020-12-19 ENCOUNTER — Encounter (HOSPITAL_COMMUNITY)
Admission: RE | Admit: 2020-12-19 | Discharge: 2020-12-19 | Disposition: A | Discharge status: Home / Self Care | Payer: Medicare Other | Source: Ambulatory Visit | Attending: Cardiology | Admitting: Cardiology

## 2020-12-19 ENCOUNTER — Telehealth: Payer: Self-pay

## 2020-12-19 DIAGNOSIS — Z951 Presence of aortocoronary bypass graft: Secondary | ICD-10-CM

## 2020-12-19 NOTE — Telephone Encounter (Signed)
Approved for SynviscOne, bilateral knee. Buy & Bill Patient will be responsible for 20% OOP Co-pay of $30.00 required No PA required  Appt.01/03/2021 with Dr. Magnus Ivan

## 2020-12-20 ENCOUNTER — Ambulatory Visit: Payer: Medicare Other | Admitting: Orthopaedic Surgery

## 2020-12-20 NOTE — Progress Notes (Signed)
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  acetaminophen (TYLENOL) 650 MG CR tablet, Take 1,300 mg by mouth 2 (two) times daily as needed for pain., Disp: , Rfl:  .  amLODipine (NORVASC) 5 MG tablet, Take 0.5 tablets (2.5 mg total) by mouth daily., Disp: 45 tablet, Rfl: 1 .  aspirin EC 81 MG tablet, Take 1 tablet (81 mg total) by mouth daily. Swallow whole., Disp: 30 tablet, Rfl: 11 .  Cholecalciferol (VITAMIN D) 50 MCG (2000 UT) tablet, Take 2,000 Units by mouth daily., Disp: , Rfl:  .  ferrous fumarate-b12-vitamic C-folic acid (FEROCON) capsule, Take 1 capsule by mouth 2 (two) times daily after a meal., Disp: 180 capsule, Rfl: 3 .  hydrochlorothiazide (MICROZIDE) 12.5 MG capsule, Take 1 capsule (12.5 mg total) by mouth daily., Disp: 90 capsule, Rfl: 3 .  MAGNESIUM OXIDE PO, Take 500 mg by mouth daily., Disp: , Rfl:  .  rosuvastatin (CRESTOR) 20 MG tablet, Take 1 tablet (20 mg total) by mouth daily., Disp: 90 tablet, Rfl: 3  Current Facility-Administered Medications:  .  sodium chloride flush (NS) 0.9 % injection 3 mL, 3 mL, Intravenous, Q12H, [place], Peter M, MD  Past Medical History: Past Medical History:  Diagnosis Date  . Anxiety   . Arthritis    knees  . Closed fracture of neck of left femur (HCC) 08/15/2018  . Coronary artery disease   . Hyperlipidemia   . Hypertension   . Pneumonia     Tobacco Use: Social History   Tobacco Use  Smoking Status  Former Smoker  . Quit date: 06/13/2014  . Years since quitting: 6.5  Smokeless Tobacco Never Used    Labs: Recent Review Flowsheet Data    Labs for ITP Cardiac and Pulmonary Rehab Latest Ref Rng & Units 08/15/2020 08/15/2020 08/15/2020 08/15/2020 09/13/2020   Cholestrol 100 - 199 mg/dL - - - - 409   LDLCALC 0 - 99 mg/dL - - - - 60   HDL >81 mg/dL - - - - 69   Trlycerides 0 - 149 mg/dL - - - - 67   Hemoglobin A1c 4.8 - 5.6 % - - - - -   PHART 7.350 - 7.450 - 7.405 7.354 7.346(L) -   PCO2ART 32.0 - 48.0 mmHg - 36.3 40.3 40.5 -   HCO3 20.0 - 28.0 mmol/L - 23.0 22.3 22.0 -   TCO2 22 - 32 mmol/L 27 24 23 23  -   ACIDBASEDEF 0.0 - 2.0 mmol/L - 2.0 3.0(H) 3.0(H) -   O2SAT % - 97.0 98.0 97.0 -      Capillary Blood Glucose: Lab Results  Component Value Date   GLUCAP 89 08/18/2020   GLUCAP 97 08/18/2020   GLUCAP 112 (H) 08/18/2020   GLUCAP 95 08/18/2020   GLUCAP 117 (H) 08/17/2020     Exercise Target Goals: Exercise Program Goal: Individual exercise prescription set using results from initial 6 min walk test and THRR while considering  patient's activity barriers  and safety.   Exercise Prescription Goal: Initial exercise prescription builds to 30-45 minutes a day of aerobic activity, 2-3 days per week.  Home exercise guidelines will be given to patient during program as part of exercise prescription that the participant will acknowledge.  Activity Barriers & Risk Stratification:  Activity Barriers & Cardiac Risk Stratification - 11/15/20 1010      Activity Barriers & Cardiac Risk Stratification   Activity Barriers Arthritis;Assistive Device;Left Hip Replacement;Balance Concerns;History of Falls;Other (comment)    Comments Arthritis in both knees. Per patient, needs knee replacement. Walking limited by knee pain.    Cardiac Risk Stratification High           6 Minute Walk:  6 Minute Walk    Row Name 11/15/20 1033         6 Minute Walk   Phase Initial     Distance 1350 feet      Walk Time 6 minutes     # of Rest Breaks 0     MPH 2.56     METS 2.56     RPE 13     Perceived Dyspnea  0     VO2 Peak 8.97     Symptoms No     Resting HR 51 bpm     Resting BP 152/62     Resting Oxygen Saturation  98 %     Exercise Oxygen Saturation  during 6 min walk 99 %     Max Ex. HR 72 bpm     Max Ex. BP 158/52     2 Minute Post BP 148/62            Oxygen Initial Assessment:   Oxygen Re-Evaluation:   Oxygen Discharge (Final Oxygen Re-Evaluation):   Initial Exercise Prescription:  Initial Exercise Prescription - 11/15/20 1000      Date of Initial Exercise RX and Referring Provider   Date 11/15/20    Referring Provider [place], Peter M, MD    Expected Discharge Date 01/13/21      NuStep   Level 2    SPM 1.6    Minutes 15    METs 2      Track   Laps 13    Minutes 15    METs 2.51      Prescription Details   Frequency (times per week) 3    Duration Progress to 30 minutes of continuous aerobic without signs/symptoms of physical distress      Intensity   THRR 40-80% of Max Heartrate 59-118    Ratings of Perceived Exertion 11-13    Perceived Dyspnea 0-4      Progression   Progression Continue to progress workloads to maintain intensity without signs/symptoms of physical distress.      Resistance Training   Training Prescription Yes    Weight 2 lbs    Reps 10-15           Perform Capillary Blood Glucose checks as needed.  Exercise Prescription Changes:   Exercise Prescription Changes    Row Name 11/21/20 1100 12/05/20 1049 12/19/20 1053         Response to Exercise   Blood Pressure (Admit) 138/60 122/62 144/64     Blood Pressure (Exercise) 156/56 160/50 152/70     Blood Pressure (Exit) 122/60 118/70 128/68     Heart Rate (Admit) 59 bpm 52 bpm 53 bpm     Heart Rate (Exercise) 94 bpm 93 bpm 88 bpm     Heart  Rate (Exit) 59 bpm 58 bpm 58 bpm     Rating of Perceived Exertion (Exercise) Symptoms none none none      Comments Off to a good start with exercise. -- --     Duration Continue with 30 min of aerobic exercise without signs/symptoms of physical distress. Continue with 30 min of aerobic exercise without signs/symptoms of physical distress. Continue with 30 min of aerobic exercise without signs/symptoms of physical distress.     Intensity THRR unchanged THRR unchanged THRR unchanged           Progression   Progression Continue to progress workloads to maintain intensity without signs/symptoms of physical distress. Continue to progress workloads to maintain intensity without signs/symptoms of physical distress. Continue to progress workloads to maintain intensity without signs/symptoms of physical distress.     Average METs 2.7 2.7 2.7           Resistance Training   Training Prescription Yes No Yes     Weight 2 lbs -- 2 lbs     Reps 10-15 -- 10-15     Time 10 Minutes -- 10 Minutes           Interval Training   Interval Training No No No           NuStep   Level SPM 85 85 85     Minutes METs 2.5 2.5 2.7           Track   Laps 16 16 --     Minutes 15 15 --     METs 2.86 2.86 --           Home Exercise Plan   Plans to continue exercise at -- Home (comment)  Walking Home (comment)  Walking     Frequency -- Add 1 additional day to program exercise sessions. Add 1 additional day to program exercise sessions.     Initial Home Exercises Provided -- 11/30/20 11/30/20            Exercise Comments:   Exercise Comments    Row Name 11/21/20 1206 11/30/20 1057 12/19/20 1122       Exercise Comments Patient tolerated first session of exercise well without symptoms. Reviewed home exercise guidelines and goals with patient. Reviewed goals with patient.            Exercise Goals and Review:   Exercise Goals    Row Name 11/15/20 0938             Exercise Goals   Increase Physical Activity Yes       Intervention Provide advice, education, support and  counseling about physical activity/exercise needs.;Develop an individualized exercise prescription for aerobic and resistive training based on initial evaluation findings, risk stratification, comorbidities and participant's personal goals.       Expected Outcomes Short Term: Attend rehab on a regular basis to increase amount of physical activity.;Long Term: Exercising regularly at least 3-5 days a week.;Long Term: Add in home exercise to make exercise part of routine and to increase amount of physical activity.       Increase Strength and Stamina Yes       Intervention Provide advice, education, support and counseling about physical activity/exercise needs.;Develop an individualized exercise prescription for aerobic and resistive training based on initial evaluation findings, risk stratification, comorbidities and participant's personal goals.  Expected Outcomes Short Term: Increase workloads from initial exercise prescription for resistance, speed, and METs.;Short Term: Perform resistance training exercises routinely during rehab and add in resistance training at home;Long Term: Improve cardiorespiratory fitness, muscular endurance and strength as measured by increased METs and functional capacity ( )       Able to understand and use rate of perceived exertion (RPE) scale Yes       Intervention Provide education and explanation on how to use RPE scale       Expected Outcomes Short Term: Able to use RPE daily in rehab to express subjective intensity level;Long Term:  Able to use RPE to guide intensity level when exercising independently       Knowledge and understanding of Target Heart Rate Range (THRR) Yes       Intervention Provide education and explanation of THRR including how the numbers were predicted and where they are located for reference       Expected Outcomes Short Term: Able to state/look up THRR;Long Term: Able to use THRR to govern intensity when exercising independently;Short Term:  Able to use daily as guideline for intensity in rehab       Able to check pulse independently Yes       Intervention Provide education and demonstration on how to check pulse in carotid and radial arteries.;Review the importance of being able to check your own pulse for safety during independent exercise       Expected Outcomes Short Term: Able to explain why pulse checking is important during independent exercise;Long Term: Able to check pulse independently and accurately       Understanding of Exercise Prescription Yes       Intervention Provide education, explanation, and written materials on patient's individual exercise prescription       Expected Outcomes Short Term: Able to explain program exercise prescription;Long Term: Able to explain home exercise prescription to exercise independently              Exercise Goals Re-Evaluation :  Exercise Goals Re-Evaluation    Row Name 11/21/20 1206 11/30/20 1057 12/19/20 1122         Exercise Goal Re-Evaluation   Exercise Goals Review Increase Physical Activity;Able to understand and use rate of perceived exertion (RPE) scale Increase Physical Activity;Able to understand and use rate of perceived exertion (RPE) scale;Increase Strength and Stamina;Knowledge and understanding of Target Heart Rate Range (THRR);Understanding of Exercise Prescription Increase Physical Activity;Able to understand and use rate of perceived exertion (RPE) scale;Increase Strength and Stamina;Knowledge and understanding of Target Heart Rate Range (THRR);Understanding of Exercise Prescription     Comments Patient able to understand and use RPE scale appropriately. Reviewed home exercise guidelines with patient. Patient not currently exercising at home. Patient plans to walk 15 minutes 1-2 days/week with a goal of increasing to 30 minutes 2 days/week in addition to exercise at cardiac rehab. Patient has not started walking at home due to chronic bilateral knee pain. Per patient  right knee is worse than left. Patient is scheduled to have both knees injected with gel on January 02, 2021. Patient tolerates the recumbent stepper well without exacerbating knee pain. Patient will continue using the stepper 30 minutes instead of 15 minutes on SE and 15 mintues walking. Patient is hoping using the SE will help strengthen her legs. Patient will hold walking until she has her knee injections.     Expected Outcomes Progress workloads as tolerated to help increase strength and stamina. Patient will add walking  1-2 days/week at home to help achieve 150 minutes of aerobic exercise each week. Progress workloads at cardiac rehab to help improve cardiorespiratory fitness and strengthen leg muscles.            Discharge Exercise Prescription (Final Exercise Prescription Changes):  Exercise Prescription Changes - 12/19/20 1053      Response to Exercise   Blood Pressure (Admit) 144/64    Blood Pressure (Exercise) 152/70    Blood Pressure (Exit) 128/68    Heart Rate (Admit) 53 bpm    Heart Rate (Exercise) 88 bpm    Heart Rate (Exit) 58 bpm    Rating of Perceived Exertion (Exercise) 13    Symptoms none    Duration Continue with 30 min of aerobic exercise without signs/symptoms of physical distress.    Intensity THRR unchanged      Progression   Progression Continue to progress workloads to maintain intensity without signs/symptoms of physical distress.    Average METs 2.7      Resistance Training   Training Prescription Yes    Weight 2 lbs    Reps 10-15    Time 10 Minutes      Interval Training   Interval Training No      NuStep   Level 2    SPM 85    Minutes 30    METs 2.7      Home Exercise Plan   Plans to continue exercise at Home (comment)   Walking   Frequency Add 1 additional day to program exercise sessions.    Initial Home Exercises Provided 11/30/20           Nutrition:  Target Goals: Understanding of nutrition guidelines, daily intake of sodium 1500mg ,  cholesterol 200mg , calories 30% from fat and 7% or less from saturated fats, daily to have 5 or more servings of fruits and vegetables.  Biometrics:  Pre Biometrics - 11/15/20 0937      Pre Biometrics   Waist Circumference 35.5 inches    Hip Circumference 41 inches    Waist to Hip Ratio 0.87 %    Triceps Skinfold 18 mm    % Body Fat 37.8 %    Grip Strength 29 kg    Flexibility 14.5 in    Single Leg Stand 25.31 seconds            Nutrition Therapy Plan and Nutrition Goals:  Nutrition Therapy & Goals - 11/30/20 1124      Nutrition Therapy   Diet TLC      Personal Nutrition Goals   Nutrition Goal Pt to build a healthy plate including vegetables, fruits, whole grains, and low-fat dairy products in a heart healthy meal plan.    Personal Goal #2 Pt to learn to read food labels to make heart healthy choices in the grocery store      Intervention Plan   Intervention Prescribe, educate and counsel regarding individualized specific dietary modifications aiming towards targeted core components such as weight, hypertension, lipid management, diabetes, heart failure and other comorbidities.;Nutrition handout(s) given to patient.    Expected Outcomes Short Term Goal: A plan has been developed with personal nutrition goals set during dietitian appointment.;Long Term Goal: Adherence to prescribed nutrition plan.           Nutrition Assessments:  MEDIFICTS Score Key:  ?70 Need to make dietary changes   40-70 Heart Healthy Diet  ? 40 Therapeutic Level Cholesterol Diet   Flowsheet Row CARDIAC REHAB PHASE II EXERCISE from 11/23/2020  in [facility] CARDIAC REHAB  Picture Your Plate Total Score on Admission 63     Picture Your Plate Scores:  <67 Unhealthy dietary pattern with much room for improvement.  41-50 Dietary pattern unlikely to meet recommendations for good health and room for improvement.  51-60 More healthful dietary pattern, with some room for  improvement.   >60 Healthy dietary pattern, although there may be some specific behaviors that could be improved.    Nutrition Goals Re-Evaluation:  Nutrition Goals Re-Evaluation    Row Name 11/30/20 1125 12/20/20 0719           Goals   Current Weight 159 lb (72.1 kg) 158 lb 4.6 oz (71.8 kg)      Nutrition Goal Pt to build a healthy plate including vegetables, fruits, whole grains, and low-fat dairy products in a heart healthy meal plan. Pt to build a healthy plate including vegetables, fruits, whole grains, and low-fat dairy products in a heart healthy meal plan.      Comment -- Label reading education provided.             Personal Goal #2 Re-Evaluation   Personal Goal #2 Pt to learn to read food labels to make heart healthy choices in the grocery store Pt to learn to read food labels to make heart healthy choices in the grocery store             Nutrition Goals Re-Evaluation:  Nutrition Goals Re-Evaluation    Row Name 11/30/20 1125 12/20/20 0719           Goals   Current Weight 159 lb (72.1 kg) 158 lb 4.6 oz (71.8 kg)      Nutrition Goal Pt to build a healthy plate including vegetables, fruits, whole grains, and low-fat dairy products in a heart healthy meal plan. Pt to build a healthy plate including vegetables, fruits, whole grains, and low-fat dairy products in a heart healthy meal plan.      Comment -- Label reading education provided.             Personal Goal #2 Re-Evaluation   Personal Goal #2 Pt to learn to read food labels to make heart healthy choices in the grocery store Pt to learn to read food labels to make heart healthy choices in the grocery store             Nutrition Goals Discharge (Final Nutrition Goals Re-Evaluation):  Nutrition Goals Re-Evaluation - 12/20/20 0719      Goals   Current Weight 158 lb 4.6 oz (71.8 kg)    Nutrition Goal Pt to build a healthy plate including vegetables, fruits, whole grains, and low-fat dairy products in a heart  healthy meal plan.    Comment Label reading education provided.      Personal Goal #2 Re-Evaluation   Personal Goal #2 Pt to learn to read food labels to make heart healthy choices in the grocery store           Psychosocial: Target Goals: Acknowledge presence or absence of significant depression and/or stress, maximize coping skills, provide positive support system. Participant is able to verbalize types and ability to use techniques and skills needed for reducing stress and depression.  Initial Review & Psychosocial Screening:  Initial Psych Review & Screening - 11/15/20 1328      Initial Review   Current issues with None Identified   [name] admits to having some blue days after surgery  Family Dynamics   Good Support System? Yes   [name] has her husband and three children for support     Barriers   Psychosocial barriers to participate in program The patient should benefit from training in stress management and relaxation.      Screening Interventions   Interventions Encouraged to exercise    Expected Outcomes Long Term Goal: Stressors or current issues are controlled or eliminated.           Quality of Life Scores:  Quality of Life - 11/15/20 1124      Quality of Life   Select Quality of Life      Quality of Life Scores   Health/Function Pre 27.43 %    Socioeconomic Pre 29 %    Psych/Spiritual Pre 28.29 %    Family Pre 27.6 %    GLOBAL Pre 27.92 %          Scores of 19 and below usually indicate a poorer quality of life in these areas.  A difference of  2-3 points is a clinically meaningful difference.  A difference of 2-3 points in the total score of the Quality of Life Index has been associated with significant improvement in overall quality of life, self-image, physical symptoms, and general health in studies assessing change in quality of life.  PHQ-9: Recent Review Flowsheet Data    Depression screen [facility] 2/9 11/15/2020   Decreased Interest 0   Down,  Depressed, Hopeless 0   PHQ - 2 Score 0     Interpretation of Total Score  Total Score Depression Severity:  1-4 = Minimal depression, 5-9 = Mild depression, 10-14 = Moderate depression, 15-19 = Moderately severe depression, 20-27 = Severe depression   Psychosocial Evaluation and Intervention:   Psychosocial Re-Evaluation:  Psychosocial Re-Evaluation    Row Name 11/21/20 1218 12/19/20 1721           Psychosocial Re-Evaluation   Current issues with None Identified Current Stress Concerns      Comments -- [name] has not voiced any increased psychological stressors. [name] does admit to having health concerns regarding her chronic knee pain      Expected Outcomes -- Will continue to monitor and offer support as needed.      Interventions Encouraged to attend Cardiac Rehabilitation for the exercise Encouraged to attend Cardiac Rehabilitation for the exercise      Continue Psychosocial Services  No Follow up required No Follow up required             Initial Review   Source of Stress Concerns -- Chronic Illness  Chronic bilateral knee pain             Psychosocial Discharge (Final Psychosocial Re-Evaluation):  Psychosocial Re-Evaluation - 12/19/20 1721      Psychosocial Re-Evaluation   Current issues with Current Stress Concerns    Comments [name] has not voiced any increased psychological stressors. [name] does admit to having health concerns regarding her chronic knee pain    Expected Outcomes Will continue to monitor and offer support as needed.    Interventions Encouraged to attend Cardiac Rehabilitation for the exercise    Continue Psychosocial Services  No Follow up required      Initial Review   Source of Stress Concerns Chronic Illness   Chronic bilateral knee pain          Vocational Rehabilitation: Provide vocational rehab assistance to qualifying candidates.   Vocational Rehab Evaluation & Intervention:  Vocational Rehab - 11/15/20  1332      Initial Vocational Rehab  Evaluation & Intervention   Assessment shows need for Vocational Rehabilitation No   Elita Quick is retired and does not need vocational rehab at this time          Education: Education Goals: Education classes will be provided on a weekly basis, covering required topics. Participant will state understanding/return demonstration of topics presented.  Learning Barriers/Preferences:  Learning Barriers/Preferences - 11/15/20 1333      Learning Barriers/Preferences   Learning Barriers Inability to learn new things;Exercise Concerns   [name] says that she feels dizzy at times.   Learning Preferences Skilled Demonstration;Video;Pictoral           Education Topics: Count Your Pulse:  -Group instruction provided by verbal instruction, demonstration, patient participation and written materials to support subject.  Instructors address importance of being able to find your pulse and how to count your pulse when at home without a heart monitor.  Patients get hands on experience counting their pulse with staff help and individually.   Heart Attack, Angina, and Risk Factor Modification:  -Group instruction provided by verbal instruction, video, and written materials to support subject.  Instructors address signs and symptoms of angina and heart attacks.    Also discuss risk factors for heart disease and how to make changes to improve heart health risk factors.   Functional Fitness:  -Group instruction provided by verbal instruction, demonstration, patient participation, and written materials to support subject.  Instructors address safety measures for doing things around the house.  Discuss how to get up and down off the floor, how to pick things up properly, how to safely get out of a chair without assistance, and balance training.   Meditation and Mindfulness:  -Group instruction provided by verbal instruction, patient participation, and written materials to support subject.  Instructor addresses  importance of mindfulness and meditation practice to help reduce stress and improve awareness.  Instructor also leads participants through a meditation exercise.    Stretching for Flexibility and Mobility:  -Group instruction provided by verbal instruction, patient participation, and written materials to support subject.  Instructors lead participants through series of stretches that are designed to increase flexibility thus improving mobility.  These stretches are additional exercise for major muscle groups that are typically performed during regular warm up and cool down.   Hands Only CPR:  -Group verbal, video, and participation provides a basic overview of AHA guidelines for community CPR. Role-play of emergencies allow participants the opportunity to practice calling for help and chest compression technique with discussion of AED use.   Hypertension: -Group verbal and written instruction that provides a basic overview of hypertension including the most recent diagnostic guidelines, risk factor reduction with self-care instructions and medication management.    Nutrition I class: Heart Healthy Eating:  -Group instruction provided by PowerPoint slides, verbal discussion, and written materials to support subject matter. The instructor gives an explanation and review of the Therapeutic Lifestyle Changes diet recommendations, which includes a discussion on lipid goals, dietary fat, sodium, fiber, plant stanol/sterol esters, sugar, and the components of a well-balanced, healthy diet.   Nutrition II class: Lifestyle Skills:  -Group instruction provided by PowerPoint slides, verbal discussion, and written materials to support subject matter. The instructor gives an explanation and review of label reading, grocery shopping for heart health, heart healthy recipe modifications, and ways to make healthier choices when eating out.   Diabetes Question & Answer:  -Group instruction provided by  PowerPoint  slides, verbal discussion, and written materials to support subject matter. The instructor gives an explanation and review of diabetes co-morbidities, pre- and post-prandial blood glucose goals, pre-exercise blood glucose goals, signs, symptoms, and treatment of hypoglycemia and hyperglycemia, and foot care basics.   Diabetes Blitz:  -Group instruction provided by PowerPoint slides, verbal discussion, and written materials to support subject matter. The instructor gives an explanation and review of the physiology behind type 1 and type 2 diabetes, diabetes medications and rational behind using different medications, pre- and post-prandial blood glucose recommendations and Hemoglobin A1c goals, diabetes diet, and exercise including blood glucose guidelines for exercising safely.    Portion Distortion:  -Group instruction provided by PowerPoint slides, verbal discussion, written materials, and food models to support subject matter. The instructor gives an explanation of serving size versus portion size, changes in portions sizes over the last 20 years, and what consists of a serving from each food group.   Stress Management:  -Group instruction provided by verbal instruction, video, and written materials to support subject matter.  Instructors review role of stress in heart disease and how to cope with stress positively.     Exercising on Your Own:  -Group instruction provided by verbal instruction, power point, and written materials to support subject.  Instructors discuss benefits of exercise, components of exercise, frequency and intensity of exercise, and end points for exercise.  Also discuss use of nitroglycerin and activating EMS.  Review options of places to exercise outside of rehab.  Review guidelines for sex with heart disease.   Cardiac Drugs I:  -Group instruction provided by verbal instruction and written materials to support subject.  Instructor reviews cardiac drug  classes: antiplatelets, anticoagulants, beta blockers, and statins.  Instructor discusses reasons, side effects, and lifestyle considerations for each drug class.   Cardiac Drugs II:  -Group instruction provided by verbal instruction and written materials to support subject.  Instructor reviews cardiac drug classes: angiotensin converting enzyme inhibitors (ACE-I), angiotensin II receptor blockers (ARBs), nitrates, and calcium channel blockers.  Instructor discusses reasons, side effects, and lifestyle considerations for each drug class.   Anatomy and Physiology of the Circulatory System:  Group verbal and written instruction and models provide basic cardiac anatomy and physiology, with the coronary electrical and arterial systems. Review of: AMI, Angina, Valve disease, Heart Failure, Peripheral Artery Disease, Cardiac Arrhythmia, Pacemakers, and the ICD.   Other Education:  -Group or individual verbal, written, or video instructions that support the educational goals of the cardiac rehab program.   Holiday Eating Survival Tips:  -Group instruction provided by PowerPoint slides, verbal discussion, and written materials to support subject matter. The instructor gives patients tips, tricks, and techniques to help them not only survive but enjoy the holidays despite the onslaught of food that accompanies the holidays.   Knowledge Questionnaire Score:  Knowledge Questionnaire Score - 11/15/20 1124      Knowledge Questionnaire Score   Pre Score 20/24           Core Components/Risk Factors/Patient Goals at Admission:  Personal Goals and Risk Factors at Admission - 11/15/20 0940      Core Components/Risk Factors/Patient Goals on Admission    Weight Management Yes;Weight Maintenance    Intervention Weight Management: Develop a combined nutrition and exercise program designed to reach desired caloric intake, while maintaining appropriate intake of nutrient and fiber, sodium and fats, and  appropriate energy expenditure required for the weight goal.;Weight Management: Provide education and appropriate resources to help participant work  on and attain dietary goals.    Expected Outcomes Weight Maintenance: Understanding of the daily nutrition guidelines, which includes 25-35% calories from fat, 7% or less cal from saturated fats, less than 200mg  cholesterol, less than 1.5gm of sodium, & 5 or more servings of fruits and vegetables daily;Short Term: Continue to assess and modify interventions until short term weight is achieved;Long Term: Adherence to nutrition and physical activity/exercise program aimed toward attainment of established weight goal;Understanding recommendations for meals to include 15-35% energy as protein, 25-35% energy from fat, 35-60% energy from carbohydrates, less than 200mg  of dietary cholesterol, 20-35 gm of total fiber daily;Understanding of distribution of calorie intake throughout the day with the consumption of 4-5 meals/snacks    Hypertension Yes    Intervention Provide education on lifestyle modifcations including regular physical activity/exercise, weight management, moderate sodium restriction and increased consumption of fresh fruit, vegetables, and low fat dairy, alcohol moderation, and smoking cessation.;Monitor prescription use compliance.    Expected Outcomes Short Term: Continued assessment and intervention until BP is < 140/44mm HG in hypertensive participants. < 130/32mm HG in hypertensive participants with diabetes, heart failure or chronic kidney disease.;Long Term: Maintenance of blood pressure at goal levels.    Lipids Yes    Intervention Provide education and support for participant on nutrition & aerobic/resistive exercise along with prescribed medications to achieve LDL 70mg , HDL >40mg .    Expected Outcomes Short Term: Participant states understanding of desired cholesterol values and is compliant with medications prescribed. Participant is following  exercise prescription and nutrition guidelines.;Long Term: Cholesterol controlled with medications as prescribed, with individualized exercise RX and with personalized nutrition plan. Value goals: LDL < 70mg , HDL > 40 mg.           Core Components/Risk Factors/Patient Goals Review:   Goals and Risk Factor Review    Row Name 11/21/20 1337 12/19/20 1723           Core Components/Risk Factors/Patient Goals Review   Personal Goals Review Weight Management/Obesity;Hypertension;Lipids Weight Management/Obesity;Hypertension;Lipids      Review [name] started exercise on 11/21/20 and did well with exercise. [name] has been doing well with exercise. [name] has had some intermittent exertional BP elevations. [name]'s exercise has been limited by chronic knee pain. patient to follow up with physician in a week      Expected Outcomes [name] will continue to participate in phase 2 cardiac rehab for exercise, nutrtion and lifestyle modifications [name] will continue to participate in phase 2 cardiac rehab for exercise, nutrtion and lifestyle modifications             Core Components/Risk Factors/Patient Goals at Discharge (Final Review):   Goals and Risk Factor Review - 12/19/20 1723      Core Components/Risk Factors/Patient Goals Review   Personal Goals Review Weight Management/Obesity;Hypertension;Lipids    Review [name] has been doing well with exercise. [name] has had some intermittent exertional BP elevations. [name]'s exercise has been limited by chronic knee pain. patient to follow up with physician in a week    Expected Outcomes [name] will continue to participate in phase 2 cardiac rehab for exercise, nutrtion and lifestyle modifications           ITP Comments:  ITP Comments    Row Name 11/15/20 1610 11/21/20 1216 12/19/20 1720       ITP Comments Medical Director- Dr. Armanda Magic, MD 30 Day ITP Review. [name] started exercise at cardiac rehab on 11/21/20 and did well with exercise. 30 Day ITP Review. [name] has  enjoyed  participating in phase 2 cardiac rehab. [name]'s exercise has been limited by her chronic knee pain.            Comments: See ITP Comments

## 2020-12-21 ENCOUNTER — Telehealth: Payer: Self-pay

## 2020-12-21 ENCOUNTER — Encounter (HOSPITAL_COMMUNITY)
Admission: RE | Admit: 2020-12-21 | Discharge: 2020-12-21 | Disposition: A | Discharge status: Home / Self Care | Payer: Medicare Other | Source: Ambulatory Visit | Attending: Cardiology | Admitting: Cardiology

## 2020-12-21 ENCOUNTER — Other Ambulatory Visit: Payer: Self-pay

## 2020-12-21 DIAGNOSIS — Z951 Presence of aortocoronary bypass graft: Secondary | ICD-10-CM | POA: Diagnosis not present

## 2020-12-21 NOTE — Telephone Encounter (Signed)
Patient is returning call.  °

## 2020-12-21 NOTE — Telephone Encounter (Signed)
Spoke to patient Dr.Jordan reviewed B/P readings from cardiac rehab.He advised to increase Amlodipine to 5 mg daily.

## 2020-12-21 NOTE — Telephone Encounter (Signed)
Called patient left message on personal voice mail Dr.Jordan reviewed B/P readings from Cardiac rehab.Advised to call me back.

## 2020-12-21 NOTE — Addendum Note (Signed)
Addended by: Neoma Laming on: 12/21/2020 01:46 PM   Modules accepted: Orders

## 2020-12-23 ENCOUNTER — Telehealth (HOSPITAL_COMMUNITY): Payer: Self-pay | Admitting: Physician Assistant

## 2020-12-23 ENCOUNTER — Encounter (HOSPITAL_COMMUNITY): Payer: Medicare Other

## 2020-12-28 ENCOUNTER — Encounter (HOSPITAL_COMMUNITY): Payer: Medicare Other

## 2020-12-30 ENCOUNTER — Encounter (HOSPITAL_COMMUNITY): Payer: Medicare Other

## 2020-12-30 ENCOUNTER — Telehealth (HOSPITAL_COMMUNITY): Payer: Self-pay | Admitting: Physician Assistant

## 2021-01-02 ENCOUNTER — Encounter (HOSPITAL_COMMUNITY): Payer: Medicare Other

## 2021-01-03 ENCOUNTER — Ambulatory Visit: Payer: Medicare Other | Admitting: Orthopaedic Surgery

## 2021-01-03 ENCOUNTER — Encounter: Payer: Self-pay | Admitting: Orthopaedic Surgery

## 2021-01-03 DIAGNOSIS — M1711 Unilateral primary osteoarthritis, right knee: Secondary | ICD-10-CM

## 2021-01-03 DIAGNOSIS — M1712 Unilateral primary osteoarthritis, left knee: Secondary | ICD-10-CM | POA: Diagnosis not present

## 2021-01-03 MED ORDER — HYLAN G-F 20 48 MG/6ML IX SOSY
48.0000 mg | PREFILLED_SYRINGE | INTRA_ARTICULAR | Status: AC | PRN
  Administered 2021-01-03: 48 mg via INTRA_ARTICULAR

## 2021-01-03 NOTE — Progress Notes (Signed)
   Procedure Note  Patient: Debbie Cardenas             Date of Birth: 12/27/1945           MRN: [id number]             Visit Date: 01/03/2021  Procedures: Visit Diagnoses:  1. Unilateral primary osteoarthritis, left knee   2. Unilateral primary osteoarthritis, right knee     Large Joint Inj: R knee on 01/03/2021 10:49 AM Indications: pain and diagnostic evaluation Details: 22 G 1.5 in needle, superolateral approach  Arthrogram: No  Medications: 48 mg Hylan 48 MG/6ML Outcome: tolerated well, no immediate complications Procedure, treatment alternatives, risks and benefits explained, specific risks discussed. Consent was given by the patient. Immediately prior to procedure a time out was called to verify the correct patient, procedure, equipment, support staff and site/side marked as required. Patient was prepped and draped in the usual sterile fashion.   Large Joint Inj: L knee on 01/03/2021 10:49 AM Indications: pain and diagnostic evaluation Details: 22 G 1.5 in needle, superolateral approach  Arthrogram: No  Medications: 48 mg Hylan 48 MG/6ML Outcome: tolerated well, no immediate complications Procedure, treatment alternatives, risks and benefits explained, specific risks discussed. Consent was given by the patient. Immediately prior to procedure a time out was called to verify the correct patient, procedure, equipment, support staff and site/side marked as required. Patient was prepped and draped in the usual sterile fashion.    The patient comes in today for scheduled hyaluronic acid injections in both knees with Synvisc 1 to treat the pain from osteoarthritis.  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  She is in cardiac rehab and doing well.  She is ready to have these type of injections again to treat the pain from osteoarthritis.  She has tried and failed steroid injections and really cannot take steroid injections at this point because it makes her heart race too  much.  Both knees were examined today with no effusion.  Both knees have global tenderness but good range of motion.  I did place Synvisc 1 in both knees without difficulty.  All questions and concerns were answered and addressed.  We can repeat these at the earliest in 6 months but she would need a follow-up appointment if that gets to that point.

## 2021-01-04 ENCOUNTER — Other Ambulatory Visit: Payer: Self-pay

## 2021-01-04 ENCOUNTER — Encounter (HOSPITAL_COMMUNITY)
Admission: RE | Admit: 2021-01-04 | Discharge: 2021-01-04 | Disposition: A | Discharge status: Home / Self Care | Payer: Medicare Other | Source: Ambulatory Visit | Attending: Cardiology | Admitting: Cardiology

## 2021-01-04 DIAGNOSIS — Z951 Presence of aortocoronary bypass graft: Secondary | ICD-10-CM | POA: Diagnosis not present

## 2021-01-04 NOTE — Progress Notes (Signed)
Debbie Cardenas returned to exercise today at cardiac rehab and exercised without difficulty.Gladstone Lighter, RN,BSN 01/04/2021 4:19 PM

## 2021-01-06 ENCOUNTER — Other Ambulatory Visit: Payer: Self-pay

## 2021-01-06 ENCOUNTER — Encounter (HOSPITAL_COMMUNITY)
Admission: RE | Admit: 2021-01-06 | Discharge: 2021-01-06 | Disposition: A | Discharge status: Home / Self Care | Payer: Medicare Other | Source: Ambulatory Visit | Attending: Cardiology | Admitting: Cardiology

## 2021-01-06 DIAGNOSIS — Z951 Presence of aortocoronary bypass graft: Secondary | ICD-10-CM | POA: Diagnosis not present

## 2021-01-09 ENCOUNTER — Other Ambulatory Visit: Payer: Self-pay

## 2021-01-09 ENCOUNTER — Encounter (HOSPITAL_COMMUNITY)
Admission: RE | Admit: 2021-01-09 | Discharge: 2021-01-09 | Disposition: A | Discharge status: Home / Self Care | Payer: Medicare Other | Source: Ambulatory Visit | Attending: Cardiology | Admitting: Cardiology

## 2021-01-09 DIAGNOSIS — Z951 Presence of aortocoronary bypass graft: Secondary | ICD-10-CM

## 2021-01-11 ENCOUNTER — Encounter (HOSPITAL_COMMUNITY)
Admission: RE | Admit: 2021-01-11 | Discharge: 2021-01-11 | Disposition: A | Discharge status: Home / Self Care | Payer: Medicare Other | Source: Ambulatory Visit | Attending: Cardiology | Admitting: Cardiology

## 2021-01-11 ENCOUNTER — Other Ambulatory Visit: Payer: Self-pay

## 2021-01-11 DIAGNOSIS — Z951 Presence of aortocoronary bypass graft: Secondary | ICD-10-CM

## 2021-01-13 ENCOUNTER — Encounter (HOSPITAL_COMMUNITY)
Admission: RE | Admit: 2021-01-13 | Discharge: 2021-01-13 | Disposition: A | Discharge status: Home / Self Care | Payer: Medicare Other | Source: Ambulatory Visit | Attending: Cardiology | Admitting: Cardiology

## 2021-01-13 ENCOUNTER — Other Ambulatory Visit: Payer: Self-pay

## 2021-01-13 VITALS — BP 126/62 | HR 62 | Wt 157.4 lb

## 2021-01-13 DIAGNOSIS — Z951 Presence of aortocoronary bypass graft: Secondary | ICD-10-CM

## 2021-01-16 ENCOUNTER — Encounter (HOSPITAL_COMMUNITY): Payer: Medicare Other

## 2021-01-16 ENCOUNTER — Telehealth: Payer: Self-pay | Admitting: Cardiology

## 2021-01-16 NOTE — Telephone Encounter (Signed)
Spoke to patient she stated she feels like she has a sinus infection.Stated she has a lot of phlegm in back of throat.Stated her ears are beginning to hurt.Advised she needs to call PCP.

## 2021-01-16 NOTE — Telephone Encounter (Signed)
Patient called in to see if dr will write her prescription for antibiotics for her sinus affection. Please advise

## 2021-01-17 ENCOUNTER — Telehealth: Payer: Self-pay | Admitting: Cardiology

## 2021-01-17 NOTE — Telephone Encounter (Signed)
She was still anemic when her last blood work was done in March so I think it would be a good idea to continue.   Debbie Henricksen [place] MD, [facility]

## 2021-01-17 NOTE — Telephone Encounter (Signed)
Medication was sent in for a 90 day supply. She would like to know if she needs this or not.   Will route to MD.  Thanks!

## 2021-01-17 NOTE — Telephone Encounter (Signed)
Pt c/o medication issue:  1. Name of Medication:  ferrous fumarate-b12-vitamic C-folic acid (FEROCON) capsule  2. How are you currently taking this medication (dosage and times per day)?  As prescribed   3. Are you having a reaction (difficulty breathing--STAT)?  No   4. What is your medication issue?   Patient states she went to her local pharmacy for a refill of this medication and it came up to $57. She doesn't know if it was for a 30 or 90 day supply, but she states she will not pay that for medication as it is too expensive. She would like to know if this medication is absolutely necessary or if she can discontinue it. Please advise.

## 2021-01-18 ENCOUNTER — Telehealth (HOSPITAL_COMMUNITY): Payer: Self-pay | Admitting: Physician Assistant

## 2021-01-18 ENCOUNTER — Encounter (HOSPITAL_COMMUNITY): Payer: Medicare Other

## 2021-01-18 NOTE — Progress Notes (Signed)
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 Swallow whole., Disp: 30 tablet, Rfl: 11   Cholecalciferol (VITAMIN D) 50 MCG (2000 UT) tablet, Take 2,000 Units by mouth daily., Disp: , Rfl:    ferrous fumarate-b12-vitamic C-folic acid (FEROCON) capsule, Take 1 capsule by mouth 2 (two) times daily after a meal., Disp: 180 capsule, Rfl: 3   hydrochlorothiazide (MICROZIDE) 12.5 MG capsule, Take 1 capsule (12.5 mg total) by mouth daily., Disp: 90 capsule, Rfl: 3   MAGNESIUM OXIDE PO, Take 500 mg by mouth daily., Disp: , Rfl:    rosuvastatin (CRESTOR) 20 MG tablet, Take 1 tablet (20 mg total) by mouth daily., Disp: 90 tablet, Rfl: 3  Current Facility-Administered Medications:    sodium chloride flush (NS) 0.9 % injection 3 mL, 3 mL, Intravenous, Q12H, [place], Peter M, MD  Past Medical History: Past Medical History:  Diagnosis Date   Anxiety    Arthritis    knees   Closed fracture of neck of left femur (HCC) 08/15/2018   Coronary artery disease    Hyperlipidemia    Hypertension    Pneumonia     Tobacco Use: Social History   Tobacco Use  Smoking Status Former   Pack  years: 0.00   Types: Cigarettes   Quit date: 06/13/2014   Years since quitting: 6.6  Smokeless Tobacco Never    Labs: Recent Review Flowsheet Data     Labs for ITP Cardiac and Pulmonary Rehab Latest Ref Rng & Units 08/15/2020 08/15/2020 08/15/2020 08/15/2020 09/13/2020   Cholestrol 100 - 199 mg/dL - - - - 786   LDLCALC 0 - 99 mg/dL - - - - 60   HDL >76 mg/dL - - - - 69   Trlycerides 0 - 149 mg/dL - - - - 67   Hemoglobin A1c 4.8 - 5.6 % - - - - -   PHART 7.350 - 7.450 - 7.405 7.354 7.346(L) -   PCO2ART 32.0 - 48.0 mmHg - 36.3 40.3 40.5 -   HCO3 20.0 - 28.0 mmol/L - 23.0 22.3 22.0 -   TCO2 22 - 32 mmol/L 27 24 23 23  -   ACIDBASEDEF 0.0 - 2.0 mmol/L - 2.0 3.0(H) 3.0(H) -   O2SAT % - 97.0 98.0 97.0 -       Capillary Blood Glucose: Lab Results  Component Value Date   GLUCAP 89 08/18/2020   GLUCAP 97 08/18/2020   GLUCAP 112 (H) 08/18/2020   GLUCAP 95 08/18/2020   GLUCAP 117 (H) 08/17/2020     Exercise Target Goals: Exercise Program Goal: Individual exercise prescription set using results from initial 6  min walk test and THRR while considering  patient's activity barriers and safety.   Exercise Prescription Goal: Initial exercise prescription builds to 30-45 minutes a day of aerobic activity, 2-3 days per week.  Home exercise guidelines will be given to patient during program as part of exercise prescription that the participant will acknowledge.  Activity Barriers & Risk Stratification:  Activity Barriers & Cardiac Risk Stratification - 11/15/20 1010       Activity Barriers & Cardiac Risk Stratification   Activity Barriers Arthritis;Assistive Device;Left Hip Replacement;Balance Concerns;History of Falls;Other (comment)    Comments Arthritis in both knees. Per patient, needs knee replacement. Walking limited by knee pain.    Cardiac Risk Stratification High             6 Minute Walk:  6 Minute Walk     Row Name 11/15/20 1033         6 Minute Walk   Phase Initial      Distance 1350 feet     Walk Time 6 minutes     # of Rest Breaks 0     MPH 2.56     METS 2.56     RPE 13     Perceived Dyspnea  0     VO2 Peak 8.97     Symptoms No     Resting HR 51 bpm     Resting BP 152/62     Resting Oxygen Saturation  98 %     Exercise Oxygen Saturation  during 6 min walk 99 %     Max Ex. HR 72 bpm     Max Ex. BP 158/52     2 Minute Post BP 148/62              Oxygen Initial Assessment:   Oxygen Re-Evaluation:   Oxygen Discharge (Final Oxygen Re-Evaluation):   Initial Exercise Prescription:  Initial Exercise Prescription - 11/15/20 1000       Date of Initial Exercise RX and Referring Provider   Date 11/15/20    Referring Provider [place], Peter M, MD    Expected Discharge Date 01/13/21      NuStep   Level 2    SPM 1.6    Minutes 15    METs 2      Track   Laps 13    Minutes 15    METs 2.51      Prescription Details   Frequency (times per week) 3    Duration Progress to 30 minutes of continuous aerobic without signs/symptoms of physical distress      Intensity   THRR 40-80% of Max Heartrate 59-118    Ratings of Perceived Exertion 11-13    Perceived Dyspnea 0-4      Progression   Progression Continue to progress workloads to maintain intensity without signs/symptoms of physical distress.      Resistance Training   Training Prescription Yes    Weight 2 lbs    Reps 10-15             Perform Capillary Blood Glucose checks as needed.  Exercise Prescription Changes:   Exercise Prescription Changes     Row Name 11/21/20 1100 12/05/20 1049 12/19/20 1053 01/04/21 1050 01/13/21 1049     Response to Exercise   Blood Pressure (Admit) 138/60 122/62 144/64 142/62 126/62   Blood Pressure (Exercise) 156/56 160/50 152/70 146/60 144/56   Blood Pressure (Exit) 122/60 118/70 128/68 122/56 126/58   Heart Rate (Admit) 59 bpm 52  bpm 53 bpm 54 bpm 62 bpm   Heart Rate (Exercise) 94 bpm 93 bpm 88 bpm 101 bpm 97 bpm   Heart Rate  (Exit) 59 bpm 58 bpm 58 bpm 60 bpm 63 bpm   Rating of Perceived Exertion (Exercise) 13 13 13 13 13    Symptoms none none none none none   Comments Off to a good start with exercise. -- -- -- --   Duration Continue with 30 min of aerobic exercise without signs/symptoms of physical distress. Continue with 30 min of aerobic exercise without signs/symptoms of physical distress. Continue with 30 min of aerobic exercise without signs/symptoms of physical distress. Continue with 30 min of aerobic exercise without signs/symptoms of physical distress. Continue with 30 min of aerobic exercise without signs/symptoms of physical distress.   Intensity THRR unchanged THRR unchanged THRR unchanged THRR unchanged THRR unchanged     Progression   Progression Continue to progress workloads to maintain intensity without signs/symptoms of physical distress. Continue to progress workloads to maintain intensity without signs/symptoms of physical distress. Continue to progress workloads to maintain intensity without signs/symptoms of physical distress. Continue to progress workloads to maintain intensity without signs/symptoms of physical distress. Continue to progress workloads to maintain intensity without signs/symptoms of physical distress.   Average METs 2.7 2.7 2.7 2.6 2.8     Resistance Training   Training Prescription Yes No Yes No Yes   Weight 2 lbs -- 2 lbs -- 2 lbs   Reps 10-15 -- 10-15 -- 10-15   Time 10 Minutes -- 10 Minutes -- 10 Minutes     Interval Training   Interval Training No No No No No     NuStep   Level 2 2 2 2 3    SPM 85 85 85 85 85   Minutes 15 15 30 15 30    METs 2.5 2.5 2.7 2.5 2.8     Track   Laps 16 16 -- 15 --   Minutes 15 15 -- 15 --   METs 2.86 2.86 -- 2.74 --     Home Exercise Plan   Plans to continue exercise at -- Home (comment)  Walking Home (comment)  Walking Home (comment)  Walking Home (comment)  Walking   Frequency -- Add 1 additional day to program exercise sessions.  Add 1 additional day to program exercise sessions. Add 1 additional day to program exercise sessions. Add 1 additional day to program exercise sessions.   Initial Home Exercises Provided -- 11/30/20 11/30/20 11/30/20 11/30/20            Exercise Comments:   Exercise Comments     Row Name 11/21/20 1206 11/30/20 1057 12/19/20 1122 01/04/21 1050 01/04/21 1100   Exercise Comments Patient tolerated first session of exercise well without symptoms. Reviewed home exercise guidelines and goals with patient. Reviewed goals with patient. Reviewed METs and goals with patient. Patient walked the track today for the first time in several sessions and tolerated well.            Exercise Goals and Review:   Exercise Goals     Row Name 11/15/20 0938             Exercise Goals   Increase Physical Activity Yes       Intervention Provide advice, education, support and counseling about physical activity/exercise needs.;Develop an individualized exercise prescription for aerobic and resistive training based on initial evaluation findings, risk stratification, comorbidities and participant's personal goals.  Expected Outcomes Short Term: Attend rehab on a regular basis to increase amount of physical activity.;Long Term: Exercising regularly at least 3-5 days a week.;Long Term: Add in home exercise to make exercise part of routine and to increase amount of physical activity.       Increase Strength and Stamina Yes       Intervention Provide advice, education, support and counseling about physical activity/exercise needs.;Develop an individualized exercise prescription for aerobic and resistive training based on initial evaluation findings, risk stratification, comorbidities and participant's personal goals.       Expected Outcomes Short Term: Increase workloads from initial exercise prescription for resistance, speed, and METs.;Short Term: Perform resistance training exercises routinely during  rehab and add in resistance training at home;Long Term: Improve cardiorespiratory fitness, muscular endurance and strength as measured by increased METs and functional capacity ( )       Able to understand and use rate of perceived exertion (RPE) scale Yes       Intervention Provide education and explanation on how to use RPE scale       Expected Outcomes Short Term: Able to use RPE daily in rehab to express subjective intensity level;Long Term:  Able to use RPE to guide intensity level when exercising independently       Knowledge and understanding of Target Heart Rate Range (THRR) Yes       Intervention Provide education and explanation of THRR including how the numbers were predicted and where they are located for reference       Expected Outcomes Short Term: Able to state/look up THRR;Long Term: Able to use THRR to govern intensity when exercising independently;Short Term: Able to use daily as guideline for intensity in rehab       Able to check pulse independently Yes       Intervention Provide education and demonstration on how to check pulse in carotid and radial arteries.;Review the importance of being able to check your own pulse for safety during independent exercise       Expected Outcomes Short Term: Able to explain why pulse checking is important during independent exercise;Long Term: Able to check pulse independently and accurately       Understanding of Exercise Prescription Yes       Intervention Provide education, explanation, and written materials on patient's individual exercise prescription       Expected Outcomes Short Term: Able to explain program exercise prescription;Long Term: Able to explain home exercise prescription to exercise independently                Exercise Goals Re-Evaluation :  Exercise Goals Re-Evaluation     Row Name 11/21/20 1206 11/30/20 1057 12/19/20 1122 01/04/21 1050       Exercise Goal Re-Evaluation   Exercise Goals Review Increase Physical  Activity;Able to understand and use rate of perceived exertion (RPE) scale Increase Physical Activity;Able to understand and use rate of perceived exertion (RPE) scale;Increase Strength and Stamina;Knowledge and understanding of Target Heart Rate Range (THRR);Understanding of Exercise Prescription Increase Physical Activity;Able to understand and use rate of perceived exertion (RPE) scale;Increase Strength and Stamina;Knowledge and understanding of Target Heart Rate Range (THRR);Understanding of Exercise Prescription Increase Physical Activity;Able to understand and use rate of perceived exertion (RPE) scale;Increase Strength and Stamina;Knowledge and understanding of Target Heart Rate Range (THRR);Understanding of Exercise Prescription    Comments Patient able to understand and use RPE scale appropriately. Reviewed home exercise guidelines with patient. Patient not currently exercising at home. Patient plans  to walk 15 minutes 1-2 days/week with a goal of increasing to 30 minutes 2 days/week in addition to exercise at cardiac rehab. Patient has not started walking at home due to chronic bilateral knee pain. Per patient right knee is worse than left. Patient is scheduled to have both knees injected with gel on January 02, 2021. Patient tolerates the recumbent stepper well without exacerbating knee pain. Patient will continue using the stepper 30 minutes instead of 15 minutes on SE and 15 mintues walking. Patient is hoping using the SE will help strengthen her legs. Patient will hold walking until she has her knee injections. Patient had gel injection in knee yesterday. Patient wanted to try the track again today. Patient walked 15 minutes on the track in addition to 15 minutes on the NuStep and tolerated well. Patient will complete the program next week and [facility] [employer] [facility] [place] [facility] [place] with a goal of 3 days/week. Patient also plans to  walk as tolerated.    Expected Outcomes Progress workloads as tolerated to help increase strength and stamina. Patient will add walking 1-2 days/week at home to help achieve 150 minutes of aerobic exercise each week. Progress workloads at cardiac rehab to help improve cardiorespiratory fitness and strengthen leg muscles. [facility] and [facility] as her mode of exercise upon completion of the cardiac rehab program.             Discharge Exercise Prescription (Final Exercise Prescription Changes):  Exercise Prescription Changes - 01/13/21 1049       Response to Exercise   Blood Pressure (Admit) 126/62    Blood Pressure (Exercise) 144/56    Blood Pressure (Exit) 126/58    Heart Rate (Admit) 62 bpm    Heart Rate (Exercise) 97 bpm    Heart Rate (Exit) 63 bpm    Rating of Perceived Exertion (Exercise) 13    Symptoms none    Duration Continue with 30 min of aerobic exercise without signs/symptoms of physical distress.    Intensity THRR unchanged      Progression   Progression Continue to progress workloads to maintain intensity without signs/symptoms of physical distress.    Average METs 2.8      Resistance Training   Training Prescription Yes    Weight 2 lbs    Reps 10-15    Time 10 Minutes      Interval Training   Interval Training No      NuStep   Level 3    SPM 85    Minutes 30    METs 2.8      Home Exercise Plan   Plans to continue exercise at Home (comment)   Walking   Frequency Add 1 additional day to program exercise sessions.    Initial Home Exercises Provided 11/30/20             Nutrition:  Target Goals: Understanding of nutrition guidelines, daily intake of sodium 1500mg , cholesterol 200mg , calories 30% from fat and 7% or less from saturated fats, daily to have 5 or more servings of fruits and vegetables.  Biometrics:  Pre Biometrics - 11/15/20 0937       Pre Biometrics   Waist Circumference 35.5 inches    Hip  Circumference 41 inches    Waist to Hip Ratio 0.87 %    Triceps Skinfold 18 mm    % Body  Fat 37.8 %    Grip Strength 29 kg    Flexibility 14.5 in    Single Leg Stand 25.31 seconds              Nutrition Therapy Plan and Nutrition Goals:  Nutrition Therapy & Goals - 11/30/20 1124       Nutrition Therapy   Diet TLC      Personal Nutrition Goals   Nutrition Goal Pt to build a healthy plate including vegetables, fruits, whole grains, and low-fat dairy products in a heart healthy meal plan.    Personal Goal #2 Pt to learn to read food labels to make heart healthy choices in the grocery store      Intervention Plan   Intervention Prescribe, educate and counsel regarding individualized specific dietary modifications aiming towards targeted core components such as weight, hypertension, lipid management, diabetes, heart failure and other comorbidities.;Nutrition handout(s) given to patient.    Expected Outcomes Short Term Goal: A plan has been developed with personal nutrition goals set during dietitian appointment.;Long Term Goal: Adherence to prescribed nutrition plan.             Nutrition Assessments:  MEDIFICTS Score Key: ?70 Need to make dietary changes  40-70 Heart Healthy Diet ? 40 Therapeutic Level Cholesterol Diet   Flowsheet Row CARDIAC REHAB PHASE II EXERCISE from 11/23/2020 in [facility] CARDIAC REHAB  Picture Your Plate Total Score on Admission 63      Picture Your Plate Scores: <16 Unhealthy dietary pattern with much room for improvement. 41-50 Dietary pattern unlikely to meet recommendations for good health and room for improvement. 51-60 More healthful dietary pattern, with some room for improvement.  >60 Healthy dietary pattern, although there may be some specific behaviors that could be improved.    Nutrition Goals Re-Evaluation:  Nutrition Goals Re-Evaluation     Row Name 11/30/20 1125 12/20/20 0719 01/16/21 0944         Goals    Current Weight 159 lb (72.1 kg) 158 lb 4.6 oz (71.8 kg) 160 lb 4.4 oz (72.7 kg)     Nutrition Goal Pt to build a healthy plate including vegetables, fruits, whole grains, and low-fat dairy products in a heart healthy meal plan. Pt to build a healthy plate including vegetables, fruits, whole grains, and low-fat dairy products in a heart healthy meal plan. Pt to build a healthy plate including vegetables, fruits, whole grains, and low-fat dairy products in a heart healthy meal plan.     Comment -- Label reading education provided. --           Personal Goal #2 Re-Evaluation       Personal Goal #2 Pt to learn to read food labels to make heart healthy choices in the grocery store Pt to learn to read food labels to make heart healthy choices in the grocery store Pt to learn to read food labels to make heart healthy choices in the grocery store             Nutrition Goals Re-Evaluation:  Nutrition Goals Re-Evaluation     Row Name 11/30/20 1125 12/20/20 0719 01/16/21 0944         Goals   Current Weight 159 lb (72.1 kg) 158 lb 4.6 oz (71.8 kg) 160 lb 4.4 oz (72.7 kg)     Nutrition Goal Pt to build a healthy plate including vegetables, fruits, whole grains, and low-fat dairy products in a heart healthy meal plan. Pt to build a  healthy plate including vegetables, fruits, whole grains, and low-fat dairy products in a heart healthy meal plan. Pt to build a healthy plate including vegetables, fruits, whole grains, and low-fat dairy products in a heart healthy meal plan.     Comment -- Label reading education provided. --           Personal Goal #2 Re-Evaluation       Personal Goal #2 Pt to learn to read food labels to make heart healthy choices in the grocery store Pt to learn to read food labels to make heart healthy choices in the grocery store Pt to learn to read food labels to make heart healthy choices in the grocery store             Nutrition Goals Discharge (Final Nutrition Goals  Re-Evaluation):  Nutrition Goals Re-Evaluation - 01/16/21 0944       Goals   Current Weight 160 lb 4.4 oz (72.7 kg)    Nutrition Goal Pt to build a healthy plate including vegetables, fruits, whole grains, and low-fat dairy products in a heart healthy meal plan.      Personal Goal #2 Re-Evaluation   Personal Goal #2 Pt to learn to read food labels to make heart healthy choices in the grocery store             Psychosocial: Target Goals: Acknowledge presence or absence of significant depression and/or stress, maximize coping skills, provide positive support system. Participant is able to verbalize types and ability to use techniques and skills needed for reducing stress and depression.  Initial Review & Psychosocial Screening:  Initial Psych Review & Screening - 11/15/20 1328       Initial Review   Current issues with None Identified   Debbie Cardenas admits to having some blue days after surgery     Family Dynamics   Good Support System? Yes   Debbie Cardenas has her husband and three children for support     Barriers   Psychosocial barriers to participate in program The patient should benefit from training in stress management and relaxation.      Screening Interventions   Interventions Encouraged to exercise    Expected Outcomes Long Term Goal: Stressors or current issues are controlled or eliminated.             Quality of Life Scores:  Quality of Life - 01/11/21 1450       Quality of Life   Select Quality of Life      Quality of Life Scores   Health/Function Pre 27.43 %    Health/Function Post 28.63 %    Health/Function % Change 4.37 %    Socioeconomic Pre 29 %    Socioeconomic Post 27.3 %    Socioeconomic % Change  -5.86 %    Psych/Spiritual Pre 28.29 %    Psych/Spiritual Post 27.43 %    Psych/Spiritual % Change -3.04 %    Family Pre 27.6 %    Family Post 27 %    Family % Change -2.17 %    GLOBAL Pre 27.92 %    GLOBAL Post 27.91 %    GLOBAL % Change -0.04 %             Scores of 19 and below usually indicate a poorer quality of life in these areas.  A difference of  2-3 points is a clinically meaningful difference.  A difference of 2-3 points in the total score of the Quality of Life Index has  been associated with significant improvement in overall quality of life, self-image, physical symptoms, and general health in studies assessing change in quality of life.  PHQ-9: Recent Review Flowsheet Data     Depression screen [facility] 2/9 11/15/2020   Decreased Interest 0   Down, Depressed, Hopeless 0   PHQ - 2 Score 0      Interpretation of Total Score  Total Score Depression Severity:  1-4 = Minimal depression, 5-9 = Mild depression, 10-14 = Moderate depression, 15-19 = Moderately severe depression, 20-27 = Severe depression   Psychosocial Evaluation and Intervention:   Psychosocial Re-Evaluation:  Psychosocial Re-Evaluation     Row Name 11/21/20 1218 12/19/20 1721 01/17/21 1557         Psychosocial Re-Evaluation   Current issues with None Identified Current Stress Concerns Current Stress Concerns     Comments -- Debbie Cardenas has not voiced any increased psychological stressors. Debbie Cardenas does admit to having health concerns regarding her chronic knee pain Debbie Cardenas has not voiced any increased psychological stressors. Debbie Cardenas continues  health concerns regarding her chronic knee pain     Expected Outcomes -- Will continue to monitor and offer support as needed. Will continue to monitor and offer support as needed.     Interventions Encouraged to attend Cardiac Rehabilitation for the exercise Encouraged to attend Cardiac Rehabilitation for the exercise Encouraged to attend Cardiac Rehabilitation for the exercise     Continue Psychosocial Services  No Follow up required No Follow up required No Follow up required           Initial Review       Source of Stress Concerns -- Chronic Illness  Chronic bilateral knee pain Chronic Illness  Chronic bilateral knee pain              Psychosocial Discharge (Final Psychosocial Re-Evaluation):  Psychosocial Re-Evaluation - 01/17/21 1557       Psychosocial Re-Evaluation   Current issues with Current Stress Concerns    Comments Debbie Cardenas has not voiced any increased psychological stressors. Debbie Cardenas continues  health concerns regarding her chronic knee pain    Expected Outcomes Will continue to monitor and offer support as needed.    Interventions Encouraged to attend Cardiac Rehabilitation for the exercise    Continue Psychosocial Services  No Follow up required      Initial Review   Source of Stress Concerns Chronic Illness   Chronic bilateral knee pain            Vocational Rehabilitation: Provide vocational rehab assistance to qualifying candidates.   Vocational Rehab Evaluation & Intervention:  Vocational Rehab - 11/15/20 1332       Initial Vocational Rehab Evaluation & Intervention   Assessment shows need for Vocational Rehabilitation No   Debbie Cardenas is retired and does not need vocational rehab at this time            Education: Education Goals: Education classes will be provided on a weekly basis, covering required topics. Participant will state understanding/return demonstration of topics presented.  Learning Barriers/Preferences:  Learning Barriers/Preferences - 11/15/20 1333       Learning Barriers/Preferences   Learning Barriers Inability to learn new things;Exercise Concerns   Debbie Cardenas says that she feels dizzy at times.   Learning Preferences Skilled Demonstration;Video;Pictoral             Education Topics: Count Your Pulse:  -Group instruction provided by verbal instruction, demonstration, patient participation and written materials to support subject.  Instructors address importance of being  able to find your pulse and how to count your pulse when at home without a heart monitor.  Patients get hands on experience counting their pulse with staff help and individually.   Heart Attack, Angina,  and Risk Factor Modification:  -Group instruction provided by verbal instruction, video, and written materials to support subject.  Instructors address signs and symptoms of angina and heart attacks.    Also discuss risk factors for heart disease and how to make changes to improve heart health risk factors.   Functional Fitness:  -Group instruction provided by verbal instruction, demonstration, patient participation, and written materials to support subject.  Instructors address safety measures for doing things around the house.  Discuss how to get up and down off the floor, how to pick things up properly, how to safely get out of a chair without assistance, and balance training.   Meditation and Mindfulness:  -Group instruction provided by verbal instruction, patient participation, and written materials to support subject.  Instructor addresses importance of mindfulness and meditation practice to help reduce stress and improve awareness.  Instructor also leads participants through a meditation exercise.    Stretching for Flexibility and Mobility:  -Group instruction provided by verbal instruction, patient participation, and written materials to support subject.  Instructors lead participants through series of stretches that are designed to increase flexibility thus improving mobility.  These stretches are additional exercise for major muscle groups that are typically performed during regular warm up and cool down.   Hands Only CPR:  -Group verbal, video, and participation provides a basic overview of AHA guidelines for community CPR. Role-play of emergencies allow participants the opportunity to practice calling for help and chest compression technique with discussion of AED use.   Hypertension: -Group verbal and written instruction that provides a basic overview of hypertension including the most recent diagnostic guidelines, risk factor reduction with self-care instructions and medication  management.    Nutrition I class: Heart Healthy Eating:  -Group instruction provided by PowerPoint slides, verbal discussion, and written materials to support subject matter. The instructor gives an explanation and review of the Therapeutic Lifestyle Changes diet recommendations, which includes a discussion on lipid goals, dietary fat, sodium, fiber, plant stanol/sterol esters, sugar, and the components of a well-balanced, healthy diet.   Nutrition II class: Lifestyle Skills:  -Group instruction provided by PowerPoint slides, verbal discussion, and written materials to support subject matter. The instructor gives an explanation and review of label reading, grocery shopping for heart health, heart healthy recipe modifications, and ways to make healthier choices when eating out.   Diabetes Question & Answer:  -Group instruction provided by PowerPoint slides, verbal discussion, and written materials to support subject matter. The instructor gives an explanation and review of diabetes co-morbidities, pre- and post-prandial blood glucose goals, pre-exercise blood glucose goals, signs, symptoms, and treatment of hypoglycemia and hyperglycemia, and foot care basics.   Diabetes Blitz:  -Group instruction provided by PowerPoint slides, verbal discussion, and written materials to support subject matter. The instructor gives an explanation and review of the physiology behind type 1 and type 2 diabetes, diabetes medications and rational behind using different medications, pre- and post-prandial blood glucose recommendations and Hemoglobin A1c goals, diabetes diet, and exercise including blood glucose guidelines for exercising safely.    Portion Distortion:  -Group instruction provided by PowerPoint slides, verbal discussion, written materials, and food models to support subject matter. The instructor gives an explanation of serving size versus portion size, changes in portions sizes over  the last 20 years,  and what consists of a serving from each food group.   Stress Management:  -Group instruction provided by verbal instruction, video, and written materials to support subject matter.  Instructors review role of stress in heart disease and how to cope with stress positively.     Exercising on Your Own:  -Group instruction provided by verbal instruction, power point, and written materials to support subject.  Instructors discuss benefits of exercise, components of exercise, frequency and intensity of exercise, and end points for exercise.  Also discuss use of nitroglycerin and activating EMS.  Review options of places to exercise outside of rehab.  Review guidelines for sex with heart disease.   Cardiac Drugs I:  -Group instruction provided by verbal instruction and written materials to support subject.  Instructor reviews cardiac drug classes: antiplatelets, anticoagulants, beta blockers, and statins.  Instructor discusses reasons, side effects, and lifestyle considerations for each drug class.   Cardiac Drugs II:  -Group instruction provided by verbal instruction and written materials to support subject.  Instructor reviews cardiac drug classes: angiotensin converting enzyme inhibitors (ACE-I), angiotensin II receptor blockers (ARBs), nitrates, and calcium channel blockers.  Instructor discusses reasons, side effects, and lifestyle considerations for each drug class.   Anatomy and Physiology of the Circulatory System:  Group verbal and written instruction and models provide basic cardiac anatomy and physiology, with the coronary electrical and arterial systems. Review of: AMI, Angina, Valve disease, Heart Failure, Peripheral Artery Disease, Cardiac Arrhythmia, Pacemakers, and the ICD.   Other Education:  -Group or individual verbal, written, or video instructions that support the educational goals of the cardiac rehab program.   Holiday Eating Survival Tips:  -Group instruction provided by  PowerPoint slides, verbal discussion, and written materials to support subject matter. The instructor gives patients tips, tricks, and techniques to help them not only survive but enjoy the holidays despite the onslaught of food that accompanies the holidays.   Knowledge Questionnaire Score:  Knowledge Questionnaire Score - 01/11/21 1451       Knowledge Questionnaire Score   Pre Score 20/24    Post Score 20/24             Core Components/Risk Factors/Patient Goals at Admission:  Personal Goals and Risk Factors at Admission - 11/15/20 0940       Core Components/Risk Factors/Patient Goals on Admission    Weight Management Yes;Weight Maintenance    Intervention Weight Management: Develop a combined nutrition and exercise program designed to reach desired caloric intake, while maintaining appropriate intake of nutrient and fiber, sodium and fats, and appropriate energy expenditure required for the weight goal.;Weight Management: Provide education and appropriate resources to help participant work on and attain dietary goals.    Expected Outcomes Weight Maintenance: Understanding of the daily nutrition guidelines, which includes 25-35% calories from fat, 7% or less cal from saturated fats, less than  cholesterol, less than 1.5gm of sodium, & 5 or more servings of fruits and vegetables daily;Short Term: Continue to assess and modify interventions until short term weight is achieved;Long Term: Adherence to nutrition and physical activity/exercise program aimed toward attainment of established weight goal;Understanding recommendations for meals to include 15-35% energy as protein, 25-35% energy from fat, 35-60% energy from carbohydrates, less than  of dietary cholesterol, 20-35 gm of total fiber daily;Understanding of distribution of calorie intake throughout the day with the consumption of 4-5 meals/snacks    Hypertension Yes    Intervention Provide education on lifestyle modifcations  including  regular physical activity/exercise, weight management, moderate sodium restriction and increased consumption of fresh fruit, vegetables, and low fat dairy, alcohol moderation, and smoking cessation.;Monitor prescription use compliance.    Expected Outcomes Short Term: Continued assessment and intervention until BP is < 140/8390mm HG in hypertensive participants. < 130/7380mm HG in hypertensive participants with diabetes, heart failure or chronic kidney disease.;Long Term: Maintenance of blood pressure at goal levels.    Lipids Yes    Intervention Provide education and support for participant on nutrition & aerobic/resistive exercise along with prescribed medications to achieve LDL 70mg , HDL >40mg .    Expected Outcomes Short Term: Participant states understanding of desired cholesterol values and is compliant with medications prescribed. Participant is following exercise prescription and nutrition guidelines.;Long Term: Cholesterol controlled with medications as prescribed, with individualized exercise RX and with personalized nutrition plan. Value goals: LDL < 70mg , HDL > 40 mg.             Core Components/Risk Factors/Patient Goals Review:   Goals and Risk Factor Review     Row Name 11/21/20 1337 12/19/20 1723 01/17/21 1558         Core Components/Risk Factors/Patient Goals Review   Personal Goals Review Weight Management/Obesity;Hypertension;Lipids Weight Management/Obesity;Hypertension;Lipids Weight Management/Obesity;Hypertension;Lipids     Review Debbie Cardenas started exercise on 11/21/20 and did well with exercise. Debbie Cardenas has been doing well with exercise. Debbie Cardenas has had some intermittent exertional BP elevations. Debbie Cardenas's exercise has been limited by chronic knee pain. patient to follow up with physician in a week Debbie Cardenas has been doing well with exercise.. Debbie Cardenas's exercise continues to be  limited by chronic knee pain. Debbie Cardenas enjoys participating in phase 2 cardiac rehab. Debbie Cardenas will finish cardiac rehab on  01/27/21.     Expected Outcomes Debbie Cardenas will continue to participate in phase 2 cardiac rehab for exercise, nutrtion and lifestyle modifications Debbie Cardenas will continue to participate in phase 2 cardiac rehab for exercise, nutrtion and lifestyle modifications Debbie Cardenas will continue to participate in phase 2 cardiac rehab for exercise, nutrtion and lifestyle modifications              Core Components/Risk Factors/Patient Goals at Discharge (Final Review):   Goals and Risk Factor Review - 01/17/21 1558       Core Components/Risk Factors/Patient Goals Review   Personal Goals Review Weight Management/Obesity;Hypertension;Lipids    Review Debbie Cardenas has been doing well with exercise.. Debbie Cardenas's exercise continues to be  limited by chronic knee pain. Debbie Cardenas enjoys participating in phase 2 cardiac rehab. Debbie Cardenas will finish cardiac rehab on 01/27/21.    Expected Outcomes Debbie Cardenas will continue to participate in phase 2 cardiac rehab for exercise, nutrtion and lifestyle modifications             ITP Comments:  ITP Comments     Row Name 11/15/20 [id number] 11/21/20 1216 12/19/20 1720 01/17/21 1556     ITP Comments Medical Director- Dr. Armanda Magicraci Turner, MD 30 Day ITP Review. Debbie Cardenas started exercise at cardiac rehab on 11/21/20 and did well with exercise. 30 Day ITP Review. Debbie Cardenas has enjoyed participating in phase 2 cardiac rehab. Debbie Cardenas's exercise has been limited by her chronic knee pain. 30 Day ITP Review. Debbie Cardenas continues to do well with exercise at cardiac rehab Debbie Cardenas's exercise contunues to be  limited by her chronic knee pain. Debbie Cardenas is followed by her orthopedist and recently received a steroid injuestion.             Comments: See ITP Comments

## 2021-01-20 ENCOUNTER — Encounter (HOSPITAL_COMMUNITY): Payer: Medicare Other

## 2021-01-20 MED ORDER — FEROCON PO CAPS: 1.0000 | ORAL_CAPSULE | Freq: Two times a day (BID) | ORAL | 3 refills | Status: DC

## 2021-01-20 NOTE — Telephone Encounter (Signed)
Spoke to patient Dr.Jordan advised to continue iron.Stated she was constipated.Advised ok to take Miralax as needed.Stated she still has a sinus infection.Advised to call PCP.

## 2021-01-23 ENCOUNTER — Encounter (HOSPITAL_COMMUNITY): Payer: Medicare Other

## 2021-01-25 ENCOUNTER — Telehealth (HOSPITAL_COMMUNITY): Payer: Self-pay | Admitting: *Deleted

## 2021-01-25 ENCOUNTER — Encounter (HOSPITAL_COMMUNITY): Payer: Medicare Other

## 2021-01-25 NOTE — Telephone Encounter (Signed)
Left message to call cardiac rehab.Gladstone Lighter, RN,BSN 01/25/2021 12:06 PM

## 2021-01-27 ENCOUNTER — Encounter (HOSPITAL_COMMUNITY): Payer: Medicare Other

## 2021-02-12 NOTE — Progress Notes (Signed)
Discharge Progress Report  Patient Details  Name: Debbie Cardenas MRN: [id number] Date of Birth: 06-15-1946 Referring Provider:   Flowsheet Row CARDIAC REHAB PHASE II ORIENTATION from 11/15/2020 in [facility]  Referring Provider Martinique, Peter M, MD        Number of Visits: 17  Reason for Discharge:  Patient reached a stable level of exercise. Patient independent in their exercise.  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  ADL [facility]:   Initial Exercise Prescription:  Initial Exercise Prescription - 11/15/20 1000       Date of Initial Exercise RX and Referring Provider   Date 11/15/20    Referring Provider Martinique, Peter M, MD    Expected Discharge Date 01/13/21      NuStep   Level 2    SPM 1.6    Minutes 15    METs 2      Track   Laps 13    Minutes 15    METs 2.51      Prescription Details   Frequency (times per week) 3    Duration Progress to 30 minutes of continuous aerobic without signs/symptoms of physical distress      Intensity   THRR 40-80% of Max Heartrate 59-118    Ratings of Perceived Exertion 11-13    Perceived Dyspnea 0-4      Progression   Progression Continue to progress workloads to maintain intensity without signs/symptoms of physical distress.      Resistance Training   Training Prescription Yes    Weight 2 lbs    Reps 10-15             Discharge Exercise Prescription (Final Exercise Prescription Changes):  Exercise Prescription Changes - 01/13/21 1049       Response to Exercise   Blood Pressure (Admit) 126/62    Blood Pressure (Exercise) 144/56    Blood Pressure (Exit) 126/58    Heart Rate (Admit) 62 bpm    Heart Rate (Exercise) 97 bpm    Heart Rate (Exit) 63 bpm    Rating of Perceived Exertion (Exercise) 13    Symptoms  none    Duration Continue with 30 min of aerobic exercise without signs/symptoms of physical distress.    Intensity THRR unchanged      Progression   Progression Continue to progress workloads to maintain intensity without signs/symptoms of physical distress.    Average METs 2.8      Resistance Training   Training Prescription Yes    Weight 2 lbs    Reps 10-15    Time 10 Minutes      Interval Training   Interval Training No      NuStep   Level 3    SPM 85    Minutes 30    METs 2.8      Home Exercise Plan   Plans to continue exercise at Home (comment)   Walking   Frequency Add 1 additional day to program exercise sessions.    Initial Home Exercises Provided 11/30/20             Functional Capacity:  6 Minute Walk     Row Name 11/15/20 1033         6 Minute Walk   Phase Initial  Distance 1350 feet     Walk Time 6 minutes     # of Rest Breaks 0     MPH 2.56     METS 2.56     RPE 13     Perceived Dyspnea  0     VO2 Peak 8.97     Symptoms No     Resting HR 51 bpm     Resting BP 152/62     Resting Oxygen Saturation  98 %     Exercise Oxygen Saturation  during 6 min walk 99 %     Max Ex. HR 72 bpm     Max Ex. BP 158/52     2 Minute Post BP 148/62              Psychological, QOL, Others - Outcomes: PHQ 2/9: Depression screen PHQ 2/9 11/15/2020  Decreased Interest 0  Down, Depressed, Hopeless 0  PHQ - 2 Score 0    Quality of Life:  Quality of Life - 01/11/21 1450       Quality of Life   Select Quality of Life      Quality of Life Scores   Health/Function Pre 27.43 %    Health/Function Post 28.63 %    Health/Function % Change 4.37 %    Socioeconomic Pre 29 %    Socioeconomic Post 27.3 %    Socioeconomic % Change  -5.86 %    Psych/Spiritual Pre 28.29 %    Psych/Spiritual Post 27.43 %    Psych/Spiritual % Change -3.04 %    Family Pre 27.6 %    Family Post 27 %    Family % Change -2.17 %    GLOBAL Pre 27.92 %    GLOBAL Post 27.91 %     GLOBAL % Change -0.04 %             Personal Goals: Goals established at orientation with interventions provided to work toward goal.  Personal Goals and Risk Factors at Admission - 11/15/20 0940       Core Components/Risk Factors/Patient Goals on Admission    Weight Management Yes;Weight Maintenance    Intervention Weight Management: Develop a combined nutrition and exercise program designed to reach desired caloric intake, while maintaining appropriate intake of nutrient and fiber, sodium and fats, and appropriate energy expenditure required for the weight goal.;Weight Management: Provide education and appropriate resources to help participant work on and attain dietary goals.    Expected Outcomes Weight Maintenance: Understanding of the daily nutrition guidelines, which includes 25-35% calories from fat, 7% or less cal from saturated fats, less than 223m cholesterol, less than 1.5gm of sodium, & 5 or more servings of fruits and vegetables daily;Short Term: Continue to assess and modify interventions until short term weight is achieved;Long Term: Adherence to nutrition and physical activity/exercise program aimed toward attainment of established weight goal;Understanding recommendations for meals to include 15-35% energy as protein, 25-35% energy from fat, 35-60% energy from carbohydrates, less than 2087mof dietary cholesterol, 20-35 gm of total fiber daily;Understanding of distribution of calorie intake throughout the day with the consumption of 4-5 meals/snacks    Hypertension Yes    Intervention Provide education on lifestyle modifcations including regular physical activity/exercise, weight management, moderate sodium restriction and increased consumption of fresh fruit, vegetables, and low fat dairy, alcohol moderation, and smoking cessation.;Monitor prescription use compliance.    Expected Outcomes Short Term: Continued assessment and intervention until BP is < 140/9078mG in  hypertensive participants. <  130/74m HG in hypertensive participants with diabetes, heart failure or chronic kidney disease.;Long Term: Maintenance of blood pressure at goal levels.    Lipids Yes    Intervention Provide education and support for participant on nutrition & aerobic/resistive exercise along with prescribed medications to achieve LDL <77m HDL >4077m   Expected Outcomes Short Term: Participant states understanding of desired cholesterol values and is compliant with medications prescribed. Participant is following exercise prescription and nutrition guidelines.;Long Term: Cholesterol controlled with medications as prescribed, with individualized exercise RX and with personalized nutrition plan. Value goals: LDL < 75m49mDL > 40 mg.              Personal Goals Discharge:  Goals and Risk Factor Review     Row Name 11/21/20 1337 12/19/20 1723 01/17/21 1558         Core Components/Risk Factors/Patient Goals Review   Personal Goals Review Weight Management/Obesity;Hypertension;Lipids Weight Management/Obesity;Hypertension;Lipids Weight Management/Obesity;Hypertension;Lipids     Review Debbie Cardenas started exercise on 11/21/20 and did well with exercise. Debbie Cardenas has been doing well with exercise. Debbie Cardenas has had some intermittent exertional BP elevations. Debbie Cardenas's exercise has been limited by chronic knee pain. patient to follow up with physician in a week Debbie Cardenas has been doing well with exercise.. Debbie Cardenas's exercise continues to be  limited by chronic knee pain. Debbie Cardenas enjoys participating in phase 2 cardiac rehab. Debbie Cardenas will finish cardiac rehab on 01/27/21.     Expected Outcomes Debbie Cardenas will continue to participate in phase 2 cardiac rehab for exercise, nutrtion and lifestyle modifications Debbie Cardenas will continue to participate in phase 2 cardiac rehab for exercise, nutrtion and lifestyle modifications Debbie Cardenas will continue to participate in phase 2 cardiac rehab for exercise, nutrtion and lifestyle modifications               Exercise Goals and Review:  Exercise Goals     Row Name 11/15/20 0938             Exercise Goals   Increase Physical Activity Yes       Intervention Provide advice, education, support and counseling about physical activity/exercise needs.;Develop an individualized exercise prescription for aerobic and resistive training based on initial evaluation findings, risk stratification, comorbidities and participant's personal goals.       Expected Outcomes Short Term: Attend rehab on a regular basis to increase amount of physical activity.;Long Term: Exercising regularly at least 3-5 days a week.;Long Term: Add in home exercise to make exercise part of routine and to increase amount of physical activity.       Increase Strength and Stamina Yes       Intervention Provide advice, education, support and counseling about physical activity/exercise needs.;Develop an individualized exercise prescription for aerobic and resistive training based on initial evaluation findings, risk stratification, comorbidities and participant's personal goals.       Expected Outcomes Short Term: Increase workloads from initial exercise prescription for resistance, speed, and METs.;Short Term: Perform resistance training exercises routinely during rehab and add in resistance training at home;Long Term: Improve cardiorespiratory fitness, muscular endurance and strength as measured by increased METs and functional capacity (6MWT)       Able to understand and use rate of perceived exertion (RPE) scale Yes       Intervention Provide education and explanation on how to use RPE scale       Expected Outcomes Short Term: Able to use RPE daily in rehab to express subjective intensity level;Long Term:  Able to use RPE to guide  intensity level when exercising independently       Knowledge and understanding of Target Heart Rate Range (THRR) Yes       Intervention Provide education and explanation of THRR including how the numbers were  predicted and where they are located for reference       Expected Outcomes Short Term: Able to state/look up THRR;Long Term: Able to use THRR to govern intensity when exercising independently;Short Term: Able to use daily as guideline for intensity in rehab       Able to check pulse independently Yes       Intervention Provide education and demonstration on how to check pulse in carotid and radial arteries.;Review the importance of being able to check your own pulse for safety during independent exercise       Expected Outcomes Short Term: Able to explain why pulse checking is important during independent exercise;Long Term: Able to check pulse independently and accurately       Understanding of Exercise Prescription Yes       Intervention Provide education, explanation, and written materials on patient's individual exercise prescription       Expected Outcomes Short Term: Able to explain program exercise prescription;Long Term: Able to explain home exercise prescription to exercise independently                Exercise Goals Re-Evaluation:  Exercise Goals Re-Evaluation     Row Name 11/21/20 1206 11/30/20 1057 12/19/20 1122 01/04/21 1050 01/31/21 0740     Exercise Goal Re-Evaluation   Exercise Goals Review Increase Physical Activity;Able to understand and use rate of perceived exertion (RPE) scale Increase Physical Activity;Able to understand and use rate of perceived exertion (RPE) scale;Increase Strength and Stamina;Knowledge and understanding of Target Heart Rate Range (THRR);Understanding of Exercise Prescription Increase Physical Activity;Able to understand and use rate of perceived exertion (RPE) scale;Increase Strength and Stamina;Knowledge and understanding of Target Heart Rate Range (THRR);Understanding of Exercise Prescription Increase Physical Activity;Able to understand and use rate of perceived exertion (RPE) scale;Increase Strength and Stamina;Knowledge and understanding of Target  Heart Rate Range (THRR);Understanding of Exercise Prescription Increase Physical Activity;Able to understand and use rate of perceived exertion (RPE) scale;Increase Strength and Stamina;Knowledge and understanding of Target Heart Rate Range (THRR);Understanding of Exercise Prescription   Comments Patient able to understand and use RPE scale appropriately. Reviewed home exercise guidelines with patient. Patient not currently exercising at home. Patient plans to walk 15 minutes 1-2 days/week with a goal of increasing to 30 minutes 2 days/week in addition to exercise at cardiac rehab. Patient has not started walking at home due to chronic bilateral knee pain. Per patient right knee is worse than left. Patient is scheduled to have both knees injected with gel on January 02, 2021. Patient tolerates the recumbent stepper well without exacerbating knee pain. Patient will continue using the stepper 30 minutes instead of 15 minutes on SE and 15 mintues walking. Patient is hoping using the SE will help strengthen her legs. Patient will hold walking until she has her knee injections. Patient had gel injection in knee yesterday. Patient wanted to try the track again today. Patient walked 15 minutes on the track in addition to 15 minutes on the NuStep and tolerated well. Patient will complete the program next week and [facility] [place] [facility] [place]. Patient [facility] to [facility] 2-[facility] days/week with a goal of 3 days/week. Patient also plans to walk as tolerated. Patient  did not return for final sessions of cardiac rehab. Patient achieved 2.8 METs with exercise and was planning to join the [employer] [facility].   Expected Outcomes Progress workloads as tolerated to help increase strength and stamina. Patient will add walking 1-2 days/week at home to help achieve 150 minutes of aerobic exercise each week. Progress workloads at cardiac rehab to help improve cardiorespiratory  fitness and strengthen leg muscles. [facility] and walk as her mode of exercise upon completion of the cardiac rehab program. [facility] will [facility] and walk as her mode of exercise upon completion of the cardiac rehab program.            Nutrition & Weight - Outcomes:  Pre Biometrics - 11/15/20 0937       Pre Biometrics   Waist Circumference 35.5 inches    Hip Circumference 41 inches    Waist to Hip Ratio 0.87 %    Triceps Skinfold 18 mm    % Body Fat 37.8 %    Grip Strength 29 kg    Flexibility 14.5 in    Single Leg Stand 25.31 seconds              Nutrition:  Nutrition Therapy & Goals - 11/30/20 1124       Nutrition Therapy   Diet TLC      Personal Nutrition Goals   Nutrition Goal Pt to build a healthy plate including vegetables, fruits, whole grains, and low-fat dairy products in a heart healthy meal plan.    Personal Goal #2 Pt to learn to read food labels to make heart healthy choices in the grocery store      Intervention Plan   Intervention Prescribe, educate and counsel regarding individualized specific dietary modifications aiming towards targeted core components such as weight, hypertension, lipid management, diabetes, heart failure and other comorbidities.;Nutrition handout(s) given to patient.    Expected Outcomes Short Term Goal: A plan has been developed with personal nutrition goals set during dietitian appointment.;Long Term Goal: Adherence to prescribed nutrition plan.             Nutrition Discharge:   Education Questionnaire Score:  Knowledge Questionnaire Score - 01/11/21 1451       Knowledge Questionnaire Score   Pre Score 20/24    Post Score 20/24             Debbie Cardenas completed cardiac rehab program on 01/13/21 with completion of 17 exercise sessions in Phase II. Pt maintained good attendance and progressed nicely during his participation in rehab as evidenced by increased MET level.Debbie Cardenas  did not return to exercise at cardiac rehab after being out with a Sinus infection.  Pt has made significant lifestyle changes and should be commended for her success. Marland Kitchen   Pt plans to continue exercise at the [facility], [facility] program. Debbie Cardenas did not return to exercise at cardiac rehab after being out with a Sinus infection. Debbie Cardenas did not complete her post exercise walk test at cardiac rehab. Debbie Cardenas did increase her met level. Debbie Cardenas's exercise was limited by her chronic knee pain.Barnet Pall, RN,BSN 02/12/2021 3:57 PM

## 2021-02-12 NOTE — Addendum Note (Signed)
Encounter addended by: Cammy Copa, RN on: 02/12/2021 4:02 PM  Actions taken: Clinical Note Signed, Episode resolved

## 2021-03-14 ENCOUNTER — Emergency Department (HOSPITAL_COMMUNITY)
Admission: EM | Admit: 2021-03-14 | Discharge: 2021-03-14 | Disposition: A | Discharge status: Home / Self Care | Payer: Medicare Other | Attending: Emergency Medicine | Admitting: Emergency Medicine

## 2021-03-14 ENCOUNTER — Other Ambulatory Visit: Payer: Self-pay

## 2021-03-14 ENCOUNTER — Encounter (HOSPITAL_COMMUNITY): Payer: Self-pay

## 2021-03-14 DIAGNOSIS — I10 Essential (primary) hypertension: Secondary | ICD-10-CM | POA: Insufficient documentation

## 2021-03-14 DIAGNOSIS — Z96642 Presence of left artificial hip joint: Secondary | ICD-10-CM | POA: Diagnosis not present

## 2021-03-14 DIAGNOSIS — N898 Other specified noninflammatory disorders of vagina: Secondary | ICD-10-CM | POA: Insufficient documentation

## 2021-03-14 DIAGNOSIS — Z79899 Other long term (current) drug therapy: Secondary | ICD-10-CM | POA: Insufficient documentation

## 2021-03-14 DIAGNOSIS — Z7982 Long term (current) use of aspirin: Secondary | ICD-10-CM | POA: Insufficient documentation

## 2021-03-14 DIAGNOSIS — I2510 Atherosclerotic heart disease of native coronary artery without angina pectoris: Secondary | ICD-10-CM | POA: Diagnosis not present

## 2021-03-14 DIAGNOSIS — Z87891 Personal history of nicotine dependence: Secondary | ICD-10-CM | POA: Diagnosis not present

## 2021-03-14 DIAGNOSIS — Z951 Presence of aortocoronary bypass graft: Secondary | ICD-10-CM | POA: Insufficient documentation

## 2021-03-14 DIAGNOSIS — N949 Unspecified condition associated with female genital organs and menstrual cycle: Secondary | ICD-10-CM

## 2021-03-14 NOTE — ED Provider Notes (Signed)
[facility] EMERGENCY DEPARTMENT Provider Note   CSN: [id number] Arrival date & time: 03/14/21  1056     History Chief Complaint  Patient presents with   Nausea   Vaginal problem    Debbie Cardenas is a 75 y.o. female.  75 yo post menopausal female presents to ED for complaint of vaginal abnormality. She reports she noticed a lump to the left side of her vagina over the past few days, not a/w pain, fevers, vaginal pain, abnormal vaginal discharge, dysuria, change to bowel or urinary habits.  No dyspareunia, no dysuria, no abnormal vaginal bleeding or discharge.  Urinating appropriately.  Denies abdominal pain.  No nausea or vomiting.  No fevers or chills.  Patient has not followed up with OB/GYN in multiple years.  She attempted to be evaluated at PCP but was told to come to the emergency department because they did not have an available appointment.  The history is provided by the patient. No language interpreter was used.      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   Coronary artery disease 08/15/2020   Coronary 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) 07/27/2020   HTN (hypertension) 07/27/2020   Hyperlipidemia 07/27/2020   Unilateral primary osteoarthritis, right knee 12/30/2019   Unilateral primary osteoarthritis, left knee 03/26/2019   Status post total replacement of left hip 08/28/2018   Hip fracture (HCC) 08/15/2018   Closed fracture of neck of left femur (HCC) 08/15/2018    Past Surgical History:  Procedure Laterality Date   APPENDECTOMY     CARDIAC CATHETERIZATION     CATARACT EXTRACTION, BILATERAL     CHOLECYSTECTOMY     CORONARY ARTERY BYPASS  GRAFT N/A 08/15/2020   Procedure: CORONARY ARTERY BYPASS GRAFTING (CABG)X4. USING BILATERAL MAMMARY ARTERIES AND RIGHT GREATER SAPHENOUS VEIN HARVESTED ENDOSCOPICALLY.;  Surgeon: Alleen Borne, MD;  Location: [facility] OR;  Service: Open Heart Surgery;  Laterality: N/A;   KNEE SURGERY     LEFT HEART CATH AND CORONARY ANGIOGRAPHY N/A 07/27/2020   Procedure: LEFT HEART CATH AND CORONARY ANGIOGRAPHY;  Surgeon: [place], Peter M, MD;  Location: [facility] INVASIVE CV LAB;  Service: Cardiovascular;  Laterality: N/A;   TEE WITHOUT CARDIOVERSION N/A 08/15/2020   Procedure: TRANSESOPHAGEAL ECHOCARDIOGRAM (TEE);  Surgeon: Alleen Borne, MD;  Location: [facility] OR;  Service: Open Heart Surgery;  Laterality: N/A;   TOTAL HIP ARTHROPLASTY Left 08/15/2018   Procedure: TOTAL HIP ARTHROPLASTY ANTERIOR APPROACH;  Surgeon: Kathryne Hitch, MD;  Location: [facility] ORS;  Service: Orthopedics;  Laterality: Left;     OB History   No obstetric history on file.     Family History  Problem Relation Age of Onset   Cancer Sister    Heart disease Sister    Heart attack Brother    Heart disease Brother        s/p CABG   Heart attack Father    Heart disease Sister    Heart attack Brother    Heart disease Brother     Social History   Tobacco Use   Smoking status: Former    Types:  Cigarettes    Quit date: 06/13/2014    Years since quitting: 6.7   Smokeless tobacco: Never  Vaping Use   Vaping Use: Former  Substance Use Topics   Alcohol use: Not Currently   Drug use: Never    Home Medications Prior to Admission medications   Medication Sig Start Date End Date Taking? Authorizing Provider  acetaminophen (TYLENOL) 650 MG CR tablet Take 1,300 mg by mouth 2 (two) times daily as needed for pain.    [provider]  amLODipine (NORVASC) 5 MG tablet Take 1 tablet (5 mg total) by mouth daily. 12/21/20   [place], Peter M, MD  aspirin EC 81 MG tablet Take 1 tablet (81 mg total) by mouth daily. Swallow whole. 07/11/20    [place], Peter M, MD  Cholecalciferol (VITAMIN D) 50 MCG (2000 UT) tablet Take 2,000 Units by mouth daily.    [provider]  ferrous fumarate-b12-vitamic C-folic acid (FEROCON) capsule Take 1 capsule by mouth 2 (two) times daily after a meal. 01/20/21   [place], Peter M, MD  hydrochlorothiazide (MICROZIDE) 12.5 MG capsule Take 1 capsule (12.5 mg total) by mouth daily. 10/11/20 01/09/21  Ronney Asters, NP  MAGNESIUM OXIDE PO Take 500 mg by mouth daily.    [provider]  rosuvastatin (CRESTOR) 20 MG tablet Take 1 tablet (20 mg total) by mouth daily. 07/11/20 10/09/20  [place], Peter M, MD    Allergies    Ephedrine and Ace inhibitors  Review of Systems   Review of Systems  Constitutional:  Negative for chills and fever.  HENT:  Negative for facial swelling and trouble swallowing.   Eyes:  Negative for photophobia and visual disturbance.  Respiratory:  Negative for cough and shortness of breath.   Cardiovascular:  Negative for chest pain and palpitations.  Gastrointestinal:  Negative for abdominal pain, nausea and vomiting.  Endocrine: Negative for polydipsia and polyuria.  Genitourinary:  Negative for difficulty urinating, dyspareunia, dysuria, genital sores, hematuria, pelvic pain, vaginal bleeding, vaginal discharge and vaginal pain.  Musculoskeletal:  Negative for gait problem and joint swelling.  Skin:  Negative for pallor and rash.  Neurological:  Negative for syncope and headaches.  Psychiatric/Behavioral:  Negative for agitation and confusion.    Physical Exam Updated Vital Signs BP (!) 147/66   Pulse 66   Temp 98.5 F (36.9 C) (Oral)   Resp 16   SpO2 100%   Physical Exam Vitals and nursing note reviewed. Exam conducted with a chaperone present.  Constitutional:      General: She is not in acute distress.    Appearance: Normal appearance.  HENT:     Head: Normocephalic and atraumatic.     Right Ear: External ear normal.     Left Ear: External ear  normal.     Nose: Nose normal.     Mouth/Throat:     Mouth: Mucous membranes are moist.  Eyes:     General: No scleral icterus.       Right eye: No discharge.        Left eye: No discharge.  Cardiovascular:     Rate and Rhythm: Normal rate and regular rhythm.     Pulses: Normal pulses.     Heart sounds: Normal heart sounds.  Pulmonary:     Effort: Pulmonary effort is normal. No respiratory distress.     Breath sounds: Normal breath sounds.  Abdominal:     General: Abdomen is flat.     Tenderness: There is no  abdominal tenderness.  Genitourinary:    General: Normal vulva.     Exam position: Supine.     Pubic Area: No rash.      Labia:        Right: No rash or tenderness.        Left: No rash or tenderness.      Urethra: No urethral pain.     Comments: No evidence of abscess or labial infection.  Vaginal tissue appears normal.  No vaginal bleeding or abnormal discharge.  There is a small, approximately 0.5 cm x 0.5 cm mobile nodule to the left side of her vaginal wall. Non-tender nodule. No evidence of bartholin gland abscess.  Musculoskeletal:        General: Normal range of motion.     Cervical back: Normal range of motion.     Right lower leg: No edema.     Left lower leg: No edema.  Skin:    General: Skin is warm and dry.     Capillary Refill: Capillary refill takes less than 2 seconds.  Neurological:     Mental Status: She is alert.  Psychiatric:        Mood and Affect: Mood normal.        Behavior: Behavior normal.    ED Results / Procedures / Treatments   Labs (all labs ordered are listed, but only abnormal results are displayed) Labs Reviewed - No data to display  EKG None  Radiology No results found.  Procedures Procedures   Medications Ordered in ED Medications - No data to display  ED Course  I have reviewed the triage vital signs and the nursing notes.  Pertinent labs & imaging results that were available during my care of the patient were  reviewed by me and considered in my medical decision making (see chart for details).    MDM Rules/Calculators/A&P                           75 yo female with history as above presenting to ED for vaginal complaint. No abdominal pain. Exam is re-assurring, vitals reviewed and are stable. Serious etiology has been considered.   Pt overall well appearing. Exam with chaperone present with findings as above in PE. No abdominal pain or tenderness on palpation. No evidence of pelvic organ prolapse. No bartholin's abscess. Vulva intact and tissue appears healthy. Low suspicion for life threatening etiology, doubt infectious etiology. Recommend patient follow up with OBGYN and PCP. Discussed supportive care and very strict return precautions. Concern for a possible cyst to her vaginal wall on the left side.    The patient improved significantly and was discharged in stable condition. Detailed discussions were had with the patient regarding current findings, and need for close f/u with PCP or on call doctor. The patient has been instructed to return immediately if the symptoms worsen in any way for re-evaluation. Patient verbalized understanding and is in agreement with current care plan. All questions answered prior to discharge.   Final Clinical Impression(s) / ED Diagnoses Final diagnoses:  Lump in vagina    Rx / DC Orders ED Discharge Orders     None        Sloan Leiter, DO 03/14/21 1815

## 2021-03-14 NOTE — ED Provider Notes (Signed)
Emergency Medicine Provider Triage Evaluation Note  Debbie Cardenas , a 75 y.o. female  was evaluated in triage.  Pt complains of abnromal vaginal object/lump.  Patient without cancer history.  Presented today for abnormal lump that she felt in her vaginal canal yesterday.  She denies any symptoms at all.  She states she has no urinary frequency urgency dysuria hematuria.  Denies any vaginal pain irritation denies any trouble urinating and denies any changes in her bowel movements.  Review of Systems  Positive: Vaginal lump Negative: Vag bleed  Physical Exam  BP 138/78 (BP Location: Right Arm)   Pulse 64   Temp 98.5 F (36.9 C) (Oral)   Resp 16   SpO2 99%  Gen:   Awake, no distress   Resp:  Normal effort  MSK:   Moves extremities without difficulty  Other:  Abdominal tenderness to palpation  Medical Decision Making  Medically screening exam initiated at 11:52 AM.  Appropriate orders placed.  Debbie Cardenas was informed that the remainder of the evaluation will be completed by another provider, this initial triage assessment does not replace that evaluation, and the importance of remaining in the ED until their evaluation is complete.  Discussed with charge RN - pt neds pelvic exam.    Gailen Shelter, PA 03/14/21 1155    Gwyneth Sprout, MD 03/14/21 1437

## 2021-03-14 NOTE — ED Triage Notes (Signed)
Pt reports nausea and ?vaginal cyst, describes as white and oval, denies pain, no bleeding or discharge.

## 2021-03-17 ENCOUNTER — Other Ambulatory Visit: Payer: Self-pay | Admitting: General Practice

## 2021-03-20 ENCOUNTER — Telehealth: Payer: Self-pay | Admitting: Cardiology

## 2021-03-20 NOTE — Telephone Encounter (Signed)
Spoke to patient Dr.Jordan's advice given. 

## 2021-03-20 NOTE — Telephone Encounter (Signed)
She can hold crestor for 2-3 weeks. If symptoms improve let us know and we can try something different. If symptoms do not improve she should go back on it.  Hoyt Leanos [place] MD, [facility]

## 2021-03-20 NOTE — Telephone Encounter (Signed)
Spoke with the patient who reports that she has been having on/off neck and shoulder pain and nausea. She would like to stop her Crestor to see if symptoms improve. She is willing to try a different medication.

## 2021-03-20 NOTE — Telephone Encounter (Signed)
Pt c/o medication issue:  1. Name of Medication: rosuvastatin (CRESTOR) 20 MG tablet  2. How are you currently taking this medication (dosage and times per day)? 20 mg daily as directed   3. Are you having a reaction (difficulty breathing--STAT)? Muscle soreness (neck and shoulders), nausea  4. What is your medication issue? Patient wanted to know if it would be OK for her to stop taking the medication to see if her symptoms improve

## 2021-03-29 ENCOUNTER — Telehealth: Payer: Self-pay | Admitting: Cardiology

## 2021-03-29 NOTE — Telephone Encounter (Signed)
   Patient Name: Debbie Cardenas  DOB: 1946-04-11 MRN: [id number]  Primary Cardiologist: Peter [place], MD  Chart reviewed as part of pre-operative protocol coverage.   Simple dental extractions are considered low risk procedures per guidelines and generally do not require any specific cardiac clearance. It is also generally accepted that for simple extractions and dental cleanings, there is no need to interrupt blood thinner therapy.   SBE prophylaxis is not required for the patient from a cardiac standpoint.  I will route this recommendation to the requesting party via Epic fax function and remove from pre-op pool.  Please call with questions.  Roe Rutherford Dyer Klug, PA 03/29/2021, 11:44 AM

## 2021-03-29 NOTE — Telephone Encounter (Signed)
    [facility] Pre-operative Risk Assessment    Request for surgical clearance:  What type of surgery is being performed? Dental fillings   When is this surgery scheduled? 04/17/21  What type of clearance is required (medical clearance vs. Pharmacy clearance to hold med vs. Both)? both  Are there any medications that need to be held prior to surgery and how long?TBD    Practice name and name of physician performing surgery? Dr. Mathis Fare   What is your office phone number [phone]    7.   What is your office fax number [phone]  8.   Anesthesia type (None, local, MAC, general) ? local   Debbie Cardenas 03/29/2021, 10:20 AM  _________________________________________________________________   (provider comments below)

## 2021-05-26 NOTE — Progress Notes (Signed)
Cardiology Office Note   Date:  06/09/2021   ID:  Debbie Cardenas, DOB 04-17-1946, MRN [id number]  PCP:  Porfirio Oar, PA  Cardiologist:   Roverto Bodmer [place], MD   Chief Complaint  Patient presents with   Coronary Artery Disease       History of Present Illness: Debbie Cardenas is a 75 y.o. female who presents for follow up of Afib and CAD. She has a PMH of hypertension, hyperlipidemia, degenerative arthritis of both knees, and a family history of coronary artery disease.  She is a former smoker.  She underwent coronary CTA which showed a calcium score over 1000 and stenosis that was flow-limiting in the RCA and LAD.  It was felt to be high risk. She had a cardiac event monitor which showed normal sinus rhythm.  She underwent cardiac catheterization 07/08/2020 which showed 70% mid-distal left main stenosis.  50% ostial-proximal LAD stenosis and 70% proximal mid LAD stenosis, moderate size diagonal branch was noted off the area of stenosis, first marginal was noted to have 50% stenosis, ostium of RCA had 90% stenosis, and LV EF was 55-60% with normal LVEDP.    She discussed coronary artery bypass graft with Dr. Laneta Simmers.   Her preoperative carotid duplex showed no significant right ICA stenosis and 40-59% left ICA stenosis.  She underwent successful CABG x 4 on 08/05/20.  She was discharged 08/24/2020.  She developed postoperative atrial fibrillation on day 5.  She was discharged on Eliquis and amiodarone.  When seen for follow up she was in NSR.    She has since been off amiodarone. She was having side effects when taking iron and Crestor 20 mg and these we stopped. Since then she has felt much better. Really feels back to normal. No dyspnea, chest pain or palpitations. Mild sternal tenderness at lower incision.    Past Medical History:  Diagnosis Date   Anxiety    Arthritis    knees   Closed fracture of neck of left femur (HCC) 08/15/2018   Coronary artery disease    Hyperlipidemia     Hypertension    Pneumonia     Past Surgical History:  Procedure Laterality Date   APPENDECTOMY     CARDIAC CATHETERIZATION     CATARACT EXTRACTION, BILATERAL     CHOLECYSTECTOMY     CORONARY ARTERY BYPASS GRAFT N/A 08/15/2020   Procedure: CORONARY ARTERY BYPASS GRAFTING (CABG)X4. USING BILATERAL MAMMARY ARTERIES AND RIGHT GREATER SAPHENOUS VEIN HARVESTED ENDOSCOPICALLY.;  Surgeon: Alleen Borne, MD;  Location: [facility] OR;  Service: Open Heart Surgery;  Laterality: N/A;   KNEE SURGERY     LEFT HEART CATH AND CORONARY ANGIOGRAPHY N/A 07/27/2020   Procedure: LEFT HEART CATH AND CORONARY ANGIOGRAPHY;  Surgeon: [place], Zala Degrasse M, MD;  [facility] INV[facility] LAB;  Service: Cardiovascular;  Laterality: N/A;   TEE WITHOUT CARDIOVERSION N/A 08/15/2020   Procedure: TRANSESOPHAGEAL ECHOCARDIOGRAM (TEE);  Surgeon: Alleen Borne, MD;  Location: [facility] OR;  Service: Open Heart Surgery;  Laterality: N/A;   TOTAL HIP ARTHROPLASTY Left 08/15/2018   Procedure: TOTAL HIP ARTHROPLASTY ANTERIOR APPROACH;  Surgeon: Kathryne Hitch, MD;  Location: [facility] ORS;  Service: Orthopedics;  Laterality: Left;     Current Outpatient Medications  Medication Sig Dispense Refill   acetaminophen (TYLENOL) 650 MG CR tablet Take 1,300 mg by mouth 2 (two) times daily as needed for pain.     ALPRAZolam (XANAX) 0.25 MG tablet Take by mouth.     amLODipine (  NORVASC) 5 MG tablet Take by mouth.     aspirin EC 81 MG tablet Take 1 tablet (81 mg total) by mouth daily. Swallow whole. 30 tablet 11   Cholecalciferol (VITAMIN D) 50 MCG (2000 UT) tablet Take 2,000 Units by mouth daily.     MAGNESIUM OXIDE PO Take 500 mg by mouth daily.     rosuvastatin (CRESTOR) 5 MG tablet Take 1 tablet (5 mg total) by mouth daily. 90 tablet 3   hydrochlorothiazide (MICROZIDE) 12.5 MG capsule Take 1 capsule (12.5 mg total) by mouth daily. 90 capsule 3   Current Facility-Administered Medications  Medication Dose Route Frequency Provider Last Rate Last  Admin   sodium chloride flush (NS) 0.9 % injection 3 mL  3 mL Intravenous Q12H [place], Darionna Banke M, MD        Allergies:   Ephedrine and Ace inhibitors    Social History:  The patient  reports that she quit smoking about 6 years ago. Her smoking use included cigarettes. She has never used smokeless tobacco. She reports that she does not currently use alcohol. She reports that she does not use drugs.   Family History:  The patient's family history includes Cancer in her sister; Heart attack in her brother, brother, and father; Heart disease in her brother, brother, sister, and sister.    ROS:  Please see the history of present illness.   Otherwise, review of systems are positive for none.   All other systems are reviewed and negative.    PHYSICAL EXAM: VS:  BP (!) 164/67   Pulse 66   Ht 5\' 5"  (1.651 m)   Wt 149 lb 12.8 oz (67.9 kg)   SpO2 100%   BMI 24.93 kg/m  , BMI Body mass index is 24.93 kg/m. GEN: Well nourished, well developed, in no acute distress  HEENT: normal  Neck: no JVD, carotid bruits, or masses Cardiac: RRR; no murmurs, rubs, or gallops,no edema  Respiratory:  clear to auscultation bilaterally, normal work of breathing GI: soft, nontender, nondistended, + BS MS: no deformity or atrophy  Skin: warm and dry, no rash Neuro:  Strength and sensation are intact Psych: euthymic mood, full affect   EKG:  EKG is not ordered today. The ekg ordered today demonstrates N/A   Recent Labs: 08/22/2020: Magnesium 2.0 06/05/2021: ALT 12; BUN 25; Creatinine, Ser 1.06; Hemoglobin 12.8; Platelets 274; Potassium 3.6; Sodium 142    Lipid Panel    Component Value Date/Time   CHOL 228 (H) 06/05/2021 1006   TRIG 77 06/05/2021 1006   HDL 75 06/05/2021 1006   CHOLHDL 3.0 06/05/2021 1006   CHOLHDL 4.0 08/15/2018 1502   VLDL 12 08/15/2018 1502   LDLCALC 140 (H) 06/05/2021 1006    Labs dated 03/29/21: cholesterol 169, triglycerides 74, HDL 71, LDL 84. CMET normal.  Dated 04/27/21: CBC  normal.  Wt Readings from Last 3 Encounters:  06/09/21 149 lb 12.8 oz (67.9 kg)  01/13/21 157 lb 6.5 oz (71.4 kg)  12/05/20 157 lb (71.2 kg)      Other studies Reviewed: Additional studies/ records that were reviewed today include:   Echocardiogram 08/16/18   Study Conclusions   - Left ventricle: The cavity size was normal. There was mild    concentric hypertrophy. Systolic function was normal. The    estimated ejection fraction was in the range of 60% to 65%. Wall    motion was normal; there were no regional wall motion    abnormalities. Doppler parameters are consistent  with abnormal    left ventricular relaxation (grade 1 diastolic dysfunction).    There was no evidence of elevated ventricular filling pressure by    Doppler parameters.  - Aortic valve: Valve area (VTI): 2.27 cm^2. Valve area (Vmax): 2.1    cm^2. Valve area (Vmean): 2.24 cm^2.  - Mitral valve: Valve area by pressure half-time: 2.44 cm^2.  - Right ventricle: Systolic function was normal.  - Right atrium: The atrium was normal in size.  - Tricuspid valve: There was no regurgitation.  - Pulmonary arteries: Systolic pressure was within the normal    range.  - Inferior vena cava: The vessel was normal in size.  - Pericardium, extracardiac: There was no pericardial effusion.   Carotid Dopplers 08/11/2018   Summary:  Right Carotid: The extracranial vessels were near-normal with only minimal  wall                 thickening or plaque.   Left Carotid: Velocities in the left ICA are consistent with a 40-59%  stenosis.  Vertebrals:  Right vertebral artery demonstrates antegrade flow. Left  vertebral               - dampened.  Subclavians: Normal flow hemodynamics were seen in bilateral subclavian               arteries.   Right ABI: Resting right ankle-brachial index is within normal range. No  evidence of significant right lower extremity arterial disease.  Left ABI: Resting left ankle-brachial index is  within normal range. No  evidence of significant left lower extremity arterial disease.  Right Upper Extremity: Doppler waveforms remain within normal limits with  right radial compression. Doppler waveforms decrease 50% with right ulnar  compression.  Left Upper Extremity: Doppler waveforms remain within normal limits with  left radial compression. Doppler waveform obliterate with left ulnar  compression.   Cardiac cath 07/27/20:  LEFT HEART CATH AND CORONARY ANGIOGRAPHY    Conclusion    Mid LM to Dist LM lesion is 70% stenosed. Ost LAD to Prox LAD lesion is 50% stenosed. Prox LAD to Mid LAD lesion is 70% stenosed. 1st Mrg lesion is 50% stenosed. Dist Cx lesion is 30% stenosed with 30% stenosed side branch in LPAV. Ost RCA to Prox RCA lesion is 90% stenosed. The left ventricular systolic function is normal. LV end diastolic pressure is normal. The left ventricular ejection fraction is 55-65% by visual estimate.   1. 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 Normal LV function 3. Normal LVEDP   Plan: patient has high risk anatomy that is not suitable for PCI. Recommend CT surgery evaluation for CABG.    Diagnostic Dominance: Co-dominant    Intervention    ASSESSMENT AND PLAN:  1. Coronary artery disease-  Status post CABG x4 by Dr. Laneta Simmers on 08/15/2020.   Continue aspirin No beta blocker due to bradycardia.  Heart healthy low-sodium diet Increase physical activity as tolerated   2. Atrial fibrillation- post op CABG   resolved. No recurrence   3. Hyperlipidemia- ? Side effects on Crestor versus iron. Will try low dose Crestor 5 mg daily. Recent LDL 140. Goal < 70. If unable to tolerate low dose crestor will need to consider PCSK 9 inhibitor. Repeat lab 2 months.   4. Carotid artery stenosis-carotid Doppler showed normal right carotid artery with left 40-59% carotid stenosis. Continue rosuvastatin, aspirin Repeat  carotid Dopplers 1/23  5.  HTN - controlled.    Disposition:   FU with me in 6 months   Signed, Chrystina Naff [place], MD  06/09/2021 1:47 PM    [facility] [address] [place], [place], [address] Phone [phone], Fax [phone]

## 2021-06-05 LAB — BASIC METABOLIC PANEL
BUN/Creatinine Ratio: 24 (ref 12–28)
BUN: 25 mg/dL (ref 8–27)
CO2: 27 mmol/L (ref 20–29)
Calcium: 9.5 mg/dL (ref 8.7–10.3)
Chloride: 100 mmol/L (ref 96–106)
Creatinine, Ser: 1.06 mg/dL — ABNORMAL HIGH (ref 0.57–1.00)
Glucose: 86 mg/dL (ref 70–99)
Potassium: 3.6 mmol/L (ref 3.5–5.2)
Sodium: 142 mmol/L (ref 134–144)
eGFR: 55 mL/min/{1.73_m2} — ABNORMAL LOW (ref >59)

## 2021-06-05 LAB — CBC WITH DIFFERENTIAL/PLATELET
Basophils Absolute: 0.1 10*3/uL (ref 0.0–0.2)
Basos: 2 %
EOS (ABSOLUTE): 0.1 10*3/uL (ref 0.0–0.4)
Eos: 2 %
Hematocrit: 36.2 % (ref 34.0–46.6)
Hemoglobin: 12.8 g/dL (ref 11.1–15.9)
Immature Grans (Abs): 0 10*3/uL (ref 0.0–0.1)
Immature Granulocytes: 0 %
Lymphocytes Absolute: 1.9 10*3/uL (ref 0.7–3.1)
Lymphs: 36 %
MCH: 31.8 pg (ref 26.6–33.0)
MCHC: 35.4 g/dL (ref 31.5–35.7)
MCV: 90 fL (ref 79–97)
Monocytes Absolute: 0.5 10*3/uL (ref 0.1–0.9)
Monocytes: 10 %
Neutrophils Absolute: 2.6 10*3/uL (ref 1.4–7.0)
Neutrophils: 50 %
Platelets: 274 10*3/uL (ref 150–450)
RBC: 4.02 x10E6/uL (ref 3.77–5.28)
RDW: 12.2 % (ref 11.7–15.4)
WBC: 5.3 10*3/uL (ref 3.4–10.8)

## 2021-06-05 LAB — LIPID PANEL
Chol/HDL Ratio: 3 ratio (ref 0.0–4.4)
Cholesterol, Total: 228 mg/dL — ABNORMAL HIGH (ref 100–199)
HDL: 75 mg/dL (ref >39)
LDL Chol Calc (NIH): 140 mg/dL — ABNORMAL HIGH (ref 0–99)
Triglycerides: 77 mg/dL (ref 0–149)
VLDL Cholesterol Cal: 13 mg/dL (ref 5–40)

## 2021-06-05 LAB — HEPATIC FUNCTION PANEL
ALT: 12 IU/L (ref 0–32)
AST: 18 IU/L (ref 0–40)
Albumin: 4.6 g/dL (ref 3.7–4.7)
Alkaline Phosphatase: 68 IU/L (ref 44–121)
Bilirubin Total: 0.7 mg/dL (ref 0.0–1.2)
Bilirubin, Direct: 0.17 mg/dL (ref 0.00–0.40)
Total Protein: 6.9 g/dL (ref 6.0–8.5)

## 2021-06-09 ENCOUNTER — Other Ambulatory Visit: Payer: Self-pay

## 2021-06-09 ENCOUNTER — Ambulatory Visit: Payer: Medicare Other | Admitting: Cardiology

## 2021-06-09 ENCOUNTER — Encounter: Payer: Self-pay | Admitting: Cardiology

## 2021-06-09 VITALS — BP 164/67 | HR 66 | Ht 65.0 in | Wt 149.8 lb

## 2021-06-09 DIAGNOSIS — E7800 Pure hypercholesterolemia, unspecified: Secondary | ICD-10-CM | POA: Diagnosis not present

## 2021-06-09 DIAGNOSIS — I2510 Atherosclerotic heart disease of native coronary artery without angina pectoris: Secondary | ICD-10-CM | POA: Diagnosis not present

## 2021-06-09 DIAGNOSIS — I6522 Occlusion and stenosis of left carotid artery: Secondary | ICD-10-CM | POA: Diagnosis not present

## 2021-06-09 DIAGNOSIS — I10 Essential (primary) hypertension: Secondary | ICD-10-CM

## 2021-06-09 MED ORDER — ROSUVASTATIN CALCIUM 5 MG PO TABS: 5.0000 mg | ORAL_TABLET | Freq: Every day | ORAL | 3 refills | Status: DC

## 2021-06-09 NOTE — Patient Instructions (Signed)
Medication Instructions:  Start Crestor 5 mg take every evening  Continue all other medications  *If you need a refill on your cardiac medications before your next appointment, please call your pharmacy*   Lab Work: Lipid and Hepatic panels to be done in 3 months   Testing/Procedures: None ordered   Follow-Up: At [facility], you and your health needs are our priority.  As part of our continuing mission to provide you with exceptional heart care, we have created designated Provider Care Teams.  These Care Teams include your primary Cardiologist (physician) and Advanced Practice Providers (APPs -  Physician Assistants and Nurse Practitioners) who all work together to provide you with the care you need, when you need it.  We recommend signing up for the patient portal called "MyChart".  Sign up information is provided on this After Visit Summary.  MyChart is used to connect with patients for Virtual Visits (Telemedicine).  Patients are able to view lab/test results, encounter notes, upcoming appointments, etc.  Non-urgent messages can be sent to your provider as well.   To learn more about what you can do with MyChart, go to [email or web address].      Your next appointment:  6 months   Call in Feb to schedule May appointment    The format for your next appointment: Office   Provider:  Dr.Jordan

## 2021-06-21 ENCOUNTER — Telehealth: Payer: Self-pay | Admitting: Cardiology

## 2021-06-21 NOTE — Telephone Encounter (Signed)
Pt c/o medication issue:  1. Name of Medication:  rosuvastatin (CRESTOR) 5 MG tablet  2. How are you currently taking this medication (dosage and times per day)?  As prescribed, 1 tablet once daily by mouth   3. Are you having a reaction (difficulty breathing--STAT)?  No   4. What is your medication issue?   Patient states since this medication was decreased she has been feeling extremely sick. She assumes it is due to the lower dose. She states this morning she also woke up with a sore throat. She would like to know if Dr. Swaziland has any recommendations. Please advise.

## 2021-06-21 NOTE — Telephone Encounter (Signed)
Returned call to patient of Dr. Swaziland who was seen 11/11 - statin dose decreased She was previously on rosuvastatin 20mg  for about 4 months and was "sick about the whole time"  She has been feeling waves of nausea, pain in her neck, sore throat today   Advised that she take a 2 week statin holiday to see if symptoms resolve Explained that sore throat is unlikely to be related to statin   She was advised to call back 12/7-12/9 with update  Message routed to MD as [phone]

## 2021-06-21 NOTE — Telephone Encounter (Signed)
Agree 

## 2021-06-25 ENCOUNTER — Ambulatory Visit
Admission: EM | Admit: 2021-06-25 | Discharge: 2021-06-25 | Disposition: A | Discharge status: Home / Self Care | Payer: Medicare Other | Attending: Family Medicine | Admitting: Family Medicine

## 2021-06-25 ENCOUNTER — Other Ambulatory Visit: Payer: Self-pay

## 2021-06-25 DIAGNOSIS — J0140 Acute pansinusitis, unspecified: Secondary | ICD-10-CM | POA: Diagnosis not present

## 2021-06-25 MED ORDER — PROMETHAZINE-DM 6.25-15 MG/5ML PO SYRP: 5.0000 mL | ORAL_SOLUTION | Freq: Three times a day (TID) | ORAL | 0 refills | Status: DC | PRN

## 2021-06-25 MED ORDER — AZITHROMYCIN 250 MG PO TABS: ORAL_TABLET | ORAL | 0 refills | Status: DC

## 2021-06-25 NOTE — ED Provider Notes (Signed)
[facility]    CSN: [id number] Arrival date & time: 06/25/21  1017      History   Chief Complaint No chief complaint on file.   HPI Debbie Cardenas is a 75 y.o. female.   HPI Patient with a medical history of pneumonia, CAD,  presents with URI symptoms including cough, nasal congestion, chest congestion and chest tightness x 4-5 days. She had similar symptoms a few weeks ago that seemed to improve, however current symptoms seem to  be worsening. No history of COPD or asthma.  Denies any overt shortness of breath or wheezing.  Endorses some mild fatigue.  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   Coronary artery disease 08/15/2020   Coronary artery 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 ([place]) 07/27/2020   HTN (hypertension) 07/27/2020   Hyperlipidemia 07/27/2020   Unilateral primary osteoarthritis, right knee 12/30/2019   Unilateral primary osteoarthritis, left knee 03/26/2019   Status post total replacement of left hip 08/28/2018   Hip fracture ([place]) 08/15/2018   Closed fracture of neck of left femur ([place]) 08/15/2018    Past Surgical History:  Procedure Laterality Date   APPENDECTOMY     CARDIAC CATHETERIZATION     CATARACT EXTRACTION, BILATERAL     CHOLECYSTECTOMY     CORONARY ARTERY BYPASS GRAFT N/A 08/15/2020   Procedure: CORONARY ARTERY BYPASS GRAFTING (CABG)X4. USING BILATERAL MAMMARY ARTERIES AND RIGHT GREATER SAPHENOUS VEIN HARVESTED ENDOSCOPICALLY.;  Surgeon: Gaye Pollack, MD;  Location: [facility];  Service: Open Heart Surgery;  Laterality: N/A;   KNEE SURGERY     LEFT HEART CATH AND CORONARY ANGIOGRAPHY N/A 07/27/2020   Procedure: LEFT HEART CATH AND CORONARY  ANGIOGRAPHY;  Surgeon: Martinique, Peter M, MD;  Location: [place];  Service: Cardiovascular;  Laterality: N/A;   TEE WITHOUT CARDIOVERSION N/A 08/15/2020   Procedure: TRANSESOPHAGEAL ECHOCARDIOGRAM (TEE);  Surgeon: Gaye Pollack, MD;  Location: [place];  Service: Open Heart Surgery;  Laterality: N/A;   TOTAL HIP ARTHROPLASTY Left 08/15/2018   Procedure: TOTAL HIP ARTHROPLASTY ANTERIOR APPROACH;  Surgeon: Mcarthur Rossetti, MD;  Location: [facility];  Service: Orthopedics;  Laterality: Left;    OB History   No obstetric history on file.      Home Medications    Prior to Admission medications   Medication Sig Start Date End Date Taking? Authorizing Provider  azithromycin (ZITHROMAX) 250 MG tablet Take 2 tabs PO x 1 dose, then 1 tab PO QD x 4 days 06/25/21  Yes Scot Jun, FNP  promethazine-dextromethorphan (PROMETHAZINE-DM) 6.25-15 MG/5ML syrup Take 5 mLs by mouth 3 (three) times daily as needed for cough. 06/25/21  Yes Scot Jun, FNP  acetaminophen (TYLENOL) 650 MG CR tablet Take 1,300 mg by mouth 2 (two) times daily as needed for pain.    [provider]  ALPRAZolam Duanne Moron) 0.25 MG tablet Take by mouth. 03/29/21 03/29/22  [provider]  amLODipine (NORVASC) 5 MG tablet Take by mouth. 11/19/20   [provider]  aspirin EC 81 MG tablet Take 1 tablet (81 mg total) by mouth daily. Swallow whole. 07/11/20  Swaziland, Peter M, MD  Cholecalciferol (VITAMIN D) 50 MCG (2000 UT) tablet Take 2,000 Units by mouth daily.    [provider]  hydrochlorothiazide (MICROZIDE) 12.5 MG capsule Take 1 capsule (12.5 mg total) by mouth daily. 10/11/20 01/09/21  Ronney Asters, NP  MAGNESIUM OXIDE PO Take 500 mg by mouth daily.    [provider]  rosuvastatin (CRESTOR) 5 MG tablet Take 1 tablet (5 mg total) by mouth daily. 06/09/21 06/04/22  Swazil[place], Peter M, MD    Family History Family History  Problem Relation Age of Onset   Cancer Sister     Heart disease Sister    Heart attack Brother    Heart disease Brother        s/p CABG   Heart attack Father    Heart disease Sister    Heart attack Brother    Heart disease Brother     Social History Social History   Tobacco Use   Smoking status: Former    Types: Cigarettes    Quit date: 06/13/2014    Years since quitting: 7.0   Smokeless tobacco: Never  Vaping Use   Vaping Use: Former  Substance Use Topics   Alcohol use: Yes    Comment: Occasionally   Drug use: Never     Allergies   Ephedrine and Ace inhibitors   Review of Systems Review of Systems Pertinent negatives listed in HPI  Physical Exam Triage Vital Signs ED Triage Vitals  Enc Vitals Group     BP 06/25/21 1352 132/62     Pulse Rate 06/25/21 1352 69     Resp 06/25/21 1352 18     Temp 06/25/21 1352 98.3 F (36.8 C)     Temp Source 06/25/21 1352 Oral     SpO2 06/25/21 1352 97 %     Weight --      Height --      Head Circumference --      Peak Flow --      Pain Score 06/25/21 1214 0     Pain Loc --      Pain Edu? --      Excl. in GC? --    No data found.  Updated Vital Signs BP 132/62 (BP Location: Right Arm)   Pulse 69   Temp 98.3 F (36.8 C) (Oral)   Resp 18   SpO2 97%   Visual Acuity Right Eye Distance:   Left Eye Distance:   Bilateral Distance:    Right Eye Near:   Left Eye Near:    Bilateral Near:     Physical Exam  General Appearance:    Alert, cooperative, no distress  HENT:   Normocephalic, ears normal, nares mucosal edema with congestion, rhinorrhea, oropharynx patent  Eyes:    PERRL, conjunctiva/corneas clear, EOM's intact       Lungs:     Clear to auscultation bilaterally, respirations unlabored  Heart:    Regular rate and rhythm  Neurologic:   Awake, alert, oriented x 3. No apparent focal neurological           defect.      UC Treatments / Results  Labs (all labs ordered are listed, but only abnormal results are displayed) Labs Reviewed - No data to  display  EKG   Radiology No results found.  Procedures Procedures (including critical care time)  Medications Ordered in UC Medications - No data to display  Initial Impression / Assessment and Plan / UC Course  I have reviewed the triage vital signs and the nursing notes.  Pertinent labs & imaging results that were available during my care of the patient were reviewed by me and considered in my medical decision making (see chart for details).    Based on pattern of symptoms we will treat for acute sinusitis Start azithromycin as prescribed. Promethazine DM for cough Hydrate well with fluids Return if any of your symptoms worsen. Final Clinical Impressions(s) / UC Diagnoses   Final diagnoses:  Acute non-recurrent pansinusitis   Discharge Instructions   None    ED Prescriptions     Medication Sig Dispense Auth. Provider   azithromycin (ZITHROMAX) 250 MG tablet Take 2 tabs PO x 1 dose, then 1 tab PO QD x 4 days 6 tablet Scot Jun, FNP   promethazine-dextromethorphan (PROMETHAZINE-DM) 6.25-15 MG/5ML syrup Take 5 mLs by mouth 3 (three) times daily as needed for cough. 130 mL Scot Jun, FNP      PDMP not reviewed this encounter.   Scot Jun, FNP 06/25/21 1452

## 2021-06-25 NOTE — ED Triage Notes (Signed)
Patient states she's been sneezing and blowing her nose for the past 3 to 4 days. She states she has a wet cough with green mucus. She states she took tylenol yesterday at 5pm.    Denies Fever

## 2021-06-30 ENCOUNTER — Telehealth: Payer: Self-pay | Admitting: Orthopaedic Surgery

## 2021-06-30 NOTE — Telephone Encounter (Signed)
Pt called about getting gel injections in both knees.   Cb [phone]

## 2021-07-07 ENCOUNTER — Telehealth: Payer: Self-pay

## 2021-07-07 NOTE — Telephone Encounter (Signed)
VOB submitted for SynviscOne, bilateral knee BV pending 

## 2021-07-07 NOTE — Telephone Encounter (Signed)
Noted  

## 2021-07-10 ENCOUNTER — Telehealth: Payer: Self-pay

## 2021-07-10 ENCOUNTER — Telehealth: Payer: Self-pay | Admitting: Cardiology

## 2021-07-10 MED ORDER — AMLODIPINE BESYLATE 5 MG PO TABS: 5.0000 mg | ORAL_TABLET | Freq: Every day | ORAL | 3 refills | Status: DC

## 2021-07-10 NOTE — Telephone Encounter (Signed)
Patient called to say she has been doing much better since she stop taking lopresser.  [facility], she is suppose to be on 5mg  of amoldipine but [facility] it [facility] 2.5[facility] . She is wondering if can call and get the straighten out with the pharmacy for her.

## 2021-07-10 NOTE — Telephone Encounter (Signed)
Spoke with the patient and advised her that an updated prescription for [facility]lodipine 5 mg daily [facility]

## 2021-07-10 NOTE — Telephone Encounter (Signed)
Approved for SynviscOne, bilateral knee. Buy & Bill Patient will be responsible for 20% OOP. Co-pay of $30.00 No PA required  Appt. 07/19/2021

## 2021-07-19 ENCOUNTER — Encounter: Payer: Self-pay | Admitting: Physician Assistant

## 2021-07-19 ENCOUNTER — Other Ambulatory Visit: Payer: Self-pay

## 2021-07-19 ENCOUNTER — Ambulatory Visit: Payer: Medicare Other | Admitting: Physician Assistant

## 2021-07-19 DIAGNOSIS — M1712 Unilateral primary osteoarthritis, left knee: Secondary | ICD-10-CM | POA: Diagnosis not present

## 2021-07-19 DIAGNOSIS — M1711 Unilateral primary osteoarthritis, right knee: Secondary | ICD-10-CM | POA: Diagnosis not present

## 2021-07-19 MED ORDER — HYLAN G-F 20 48 MG/6ML IX SOSY
48.0000 mg | PREFILLED_SYRINGE | INTRA_ARTICULAR | Status: AC | PRN
Start: 2021-07-19 — End: 2021-07-19
  Administered 2021-07-19: 48 mg via INTRA_ARTICULAR

## 2021-07-19 MED ORDER — LIDOCAINE HCL 1 % IJ SOLN
3.0000 mL | INTRAMUSCULAR | Status: AC | PRN
Start: 2021-07-19 — End: 2021-07-19
  Administered 2021-07-19: 12:00:00 3 mL

## 2021-07-19 MED ORDER — LIDOCAINE HCL 1 % IJ SOLN
3.0000 mL | INTRAMUSCULAR | Status: AC | PRN
  Administered 2021-07-19: 12:00:00 3 mL

## 2021-07-19 MED ORDER — HYLAN G-F 20 48 MG/6ML IX SOSY
48.0000 mg | PREFILLED_SYRINGE | INTRA_ARTICULAR | Status: AC | PRN
  Administered 2021-07-19: 48 mg via INTRA_ARTICULAR

## 2021-07-19 NOTE — Progress Notes (Signed)
° °  Procedure Note  Patient: Debbie Cardenas             Date of Birth: 1946-04-01           MRN: [id number]             Visit Date: 07/19/2021 Patient comes in today for bilateral Synvisc 1 injections.  She has had supplemental injections in the past and has done well.  She states overall that the injections are working still some but she is starting to have some pain due to her arthritis in both knees.  She has failed conservative treatment with steroid injections and also cortisone injections caused her to have tachycardia.  Physical exam: Bilateral knees good range of motion both knees no abnormal warmth erythema or effusion. Procedures: Visit Diagnoses:  1. Unilateral primary osteoarthritis, left knee   2. Unilateral primary osteoarthritis, right knee     Large Joint Inj: bilateral knee on 07/19/2021 11:55 AM Indications: pain Details: 22 G 1.5 in needle, superolateral approach  Arthrogram: No  Medications (Right): 3 mL lidocaine 1 %; 48 mg Hylan 48 MG/6ML Medications (Left): 3 mL lidocaine 1 %; 48 mg Hylan 48 MG/6ML Outcome: tolerated well, no immediate complications Procedure, treatment alternatives, risks and benefits explained, specific risks discussed. Consent was given by the patient. Immediately prior to procedure a time out was called to verify the correct patient, procedure, equipment, support staff and site/side marked as required. Patient was prepped and draped in the usual sterile fashion.     Plan: She understands wait least 6 months between injections.  She will follow-up with [place] as needed.  Questions encouraged and answered at length today.

## 2021-08-04 ENCOUNTER — Other Ambulatory Visit: Payer: Self-pay | Admitting: Family Medicine

## 2021-08-04 ENCOUNTER — Other Ambulatory Visit: Payer: Self-pay | Admitting: General Practice

## 2021-08-08 ENCOUNTER — Ambulatory Visit (HOSPITAL_COMMUNITY)
Admission: RE | Admit: 2021-08-08 | Discharge: 2021-08-08 | Disposition: A | Discharge status: Home / Self Care | Payer: Medicare Other | Source: Ambulatory Visit | Attending: Cardiovascular Disease | Admitting: Cardiovascular Disease

## 2021-08-08 ENCOUNTER — Other Ambulatory Visit: Payer: Self-pay

## 2021-08-08 DIAGNOSIS — I6522 Occlusion and stenosis of left carotid artery: Secondary | ICD-10-CM | POA: Insufficient documentation

## 2021-08-09 ENCOUNTER — Telehealth: Payer: Self-pay | Admitting: *Deleted

## 2021-08-09 DIAGNOSIS — I6522 Occlusion and stenosis of left carotid artery: Secondary | ICD-10-CM

## 2021-08-09 NOTE — Telephone Encounter (Signed)
-----   Message from Peter M Swaziland, MD sent at 08/09/2021  9:30 AM EST ----- This study demonstrates:  carotid dopplers are unchanged.  Medication changes / Follow up studies / Other recommendations:   Repeat in one year  Please send results to the PCP:  Porfirio Oar, PA  Peter Swaziland, MD 08/09/2021 9:30 AM

## 2021-08-09 NOTE — Telephone Encounter (Signed)
The patient has been notified of the result and verbalized understanding.  All questions (if any) were answered. Routed to primary   Order placed for one year Loleta Chance 08/09/2021 5:56 PM

## 2022-01-02 NOTE — Progress Notes (Signed)
Cardiology Office Note   Date:  01/05/2022   ID:  Debbie Cardenas, DOB 1945-09-03, MRN [id number]  PCP:  Porfirio Oar, PA  Cardiologist:   Charlissa Petros [place], MD   Chief Complaint  Patient presents with   Coronary Artery Disease       History of Present Illness: Debbie Cardenas is a 76 y.o. female who presents for follow up of Afib and CAD. She has a PMH of hypertension, hyperlipidemia, degenerative arthritis of both knees, and a family history of coronary artery disease.  She is a former smoker.  She underwent coronary CTA which showed a calcium score over 1000 and stenosis that was flow-limiting in the RCA and LAD.  It was felt to be high risk. She had a cardiac event monitor which showed normal sinus rhythm.  She underwent cardiac catheterization 07/08/2020 which showed 70% mid-distal left main stenosis.  50% ostial-proximal LAD stenosis and 70% proximal mid LAD stenosis, moderate size diagonal branch was noted off the area of stenosis, first marginal was noted to have 50% stenosis, ostium of RCA had 90% stenosis, and LV EF was 55-60% with normal LVEDP.    She discussed coronary artery bypass graft with Dr. Laneta Simmers.   Her preoperative carotid duplex showed no significant right ICA stenosis and 40-59% left ICA stenosis.  She underwent successful CABG x 4 on 08/05/20.  She was discharged 08/24/2020.  She developed postoperative atrial fibrillation on day 5.  She was discharged on Eliquis and amiodarone.  When seen for follow up she was in NSR.    She has since been off amiodarone. She was unable to take Crestor even at 5 mg daily. States it made her sick. Her husband has been ill and had major abdominal surgery in April. This has been stressful for her. She denies any angina or dyspnea. Rare palpitations. Some keloid formation at CABG incision but it doesn't bother her much.     Past Medical History:  Diagnosis Date   Anxiety    Arthritis    knees   Closed fracture of neck of left femur  ([facility]) 08/15/2018   Coronary artery disease    Hyperlipidemia    Hypertension    Pneumonia     Past Surgical History:  Procedure Laterality Date   APPENDECTOMY     CARDIAC CATHETERIZATION     CATARACT EXTRACTION, BILATERAL     CHOLECYSTECTOMY     CORONARY ARTERY BYPASS GRAFT N/A 08/15/2020   Procedure: CORONARY ARTERY BYPASS GRAFTING (CABG)X4. USING BILATERAL MAMMARY ARTERIES AND RIGHT GREATER SAPHENOUS VEIN HARVESTED ENDOSCOPICALLY.;  Surgeon: Alleen Borne, MD;  Location: [facility];  Service: Open Heart Surgery;  Laterality: N/A;   KNEE SURGERY     LEFT HEART CATH AND CORONARY ANGIOGRAPHY N/A 07/27/2020   Procedure: LEFT HEART CATH AND CORONARY ANGIOGRAPHY;  Surgeon: Swaziland, Demetry Bendickson M, MD;  Location: [facility];  Service: Cardiovascular;  Laterality: N/A;   TEE WITHOUT CARDIOVERSION N/A 08/15/2020   Procedure: TRANSESOPHAGEAL ECHOCARDIOGRAM (TEE);  Surgeon: Alleen Borne, MD;  Location: [facility];  Service: Open Heart Surgery;  Laterality: N/A;   TOTAL HIP ARTHROPLASTY Left 08/15/2018   Procedure: TOTAL HIP ARTHROPLASTY ANTERIOR APPROACH;  Surgeon: Kathryne Hitch, MD;  Location: [facility];  Service: Orthopedics;  Laterality: Left;     Current Outpatient Medications  Medication Sig Dispense Refill   acetaminophen (TYLENOL) 650 MG CR tablet Take 1,300 mg by mouth 2 (two) times daily as needed for pain.  ALPRAZolam (XANAX) 0.25 MG tablet Take by mouth.     amLODipine (NORVASC) 5 MG tablet Take 1 tablet (5 mg total) by mouth daily. 90 tablet 3   aspirin EC 81 MG tablet Take 1 tablet (81 mg total) by mouth daily. Swallow whole. 30 tablet 11   Cholecalciferol (VITAMIN D) 50 MCG (2000 UT) tablet Take 2,000 Units by mouth daily.     hydrochlorothiazide (MICROZIDE) 12.5 MG capsule TAKE 1 CAPSULE BY MOUTH EVERY DAY 90 capsule 3   MAGNESIUM OXIDE PO Take 500 mg by mouth daily.     promethazine-dextromethorphan (PROMETHAZINE-DM) 6.25-15 MG/5ML syrup Take 5 mLs by mouth 3 (three) times  daily as needed for cough. 130 mL 0   Current Facility-Administered Medications  Medication Dose Route Frequency Provider Last Rate Last Admin   sodium chloride flush (NS) 0.9 % injection 3 mL  3 mL Intravenous Q12H SwazilandJordan, Ryin Schillo M, MD        Allergies:   Ephedrine and Ace inhibitors    Social History:  The patient  reports that she quit smoking about 7 years ago. Her smoking use included cigarettes. She has never used smokeless tobacco. She reports current alcohol use. She reports that she does not use drugs.   Family History:  The patient's family history includes Cancer in her sister; Heart attack in her brother, brother, and father; Heart disease in her brother, brother, sister, and sister.    ROS:  Please see the history of present illness.   Otherwise, review of systems are positive for none.   All other systems are reviewed and negative.    PHYSICAL EXAM: VS:  BP 140/60 (BP Location: Left Arm)   Pulse (!) 56   Ht 5\' 5"  (1.651 m)   Wt 151 lb 12.8 oz (68.9 kg)   SpO2 99%   BMI 25.26 kg/m  , BMI Body mass index is 25.26 kg/m. GEN: Well nourished, well developed, in no acute distress  HEENT: normal  Neck: no JVD, carotid bruits, or masses Cardiac: RRR; no murmurs, rubs, or gallops,no edema  Respiratory:  clear to auscultation bilaterally, normal work of breathing GI: soft, nontender, nondistended, + BS MS: no deformity or atrophy  Skin: warm and dry, no rash Neuro:  Strength and sensation are intact Psych: euthymic mood, full affect   EKG:  EKG is ordered today. The ekg ordered today NSR rate 56. Normal. I have personally reviewed and interpreted this study.    Recent Labs: 06/05/2021: ALT 12; BUN 25; Creatinine, Ser 1.06; Hemoglobin 12.8; Platelets 274; Potassium 3.6; Sodium 142    Lipid Panel    Component Value Date/Time   CHOL 228 (H) 06/05/2021 1006   TRIG 77 06/05/2021 1006   HDL 75 06/05/2021 1006   CHOLHDL 3.0 06/05/2021 1006   CHOLHDL 4.0 08/15/2018  1502   VLDL 12 08/15/2018 1502   LDLCALC 140 (H) 06/05/2021 1006    Labs dated 03/29/21: cholesterol 169, triglycerides 74, HDL 71, LDL 84. CMET normal.  Dated 04/27/21: CBC normal.  Wt Readings from Last 3 Encounters:  01/05/22 151 lb 12.8 oz (68.9 kg)  06/09/21 149 lb 12.8 oz (67.9 kg)  01/13/21 157 lb 6.5 oz (71.4 kg)      Other studies Reviewed: Additional studies/ records that were reviewed today include:   Echocardiogram 08/16/18   Study Conclusions   - Left ventricle: The cavity size was normal. There was mild    concentric hypertrophy. Systolic function was normal. The    estimated  ejection fraction was in the range of 60% to 65%. Wall    motion was normal; there were no regional wall motion    abnormalities. Doppler parameters are consistent with abnormal    left ventricular relaxation (grade 1 diastolic dysfunction).    There was no evidence of elevated ventricular filling pressure by    Doppler parameters.  - Aortic valve: Valve area (VTI): 2.27 cm^2. Valve area (Vmax): 2.1    cm^2. Valve area (Vmean): 2.24 cm^2.  - Mitral valve: Valve area by pressure half-time: 2.44 cm^2.  - Right ventricle: Systolic function was normal.  - Right atrium: The atrium was normal in size.  - Tricuspid valve: There was no regurgitation.  - Pulmonary arteries: Systolic pressure was within the normal    range.  - Inferior vena cava: The vessel was normal in size.  - Pericardium, extracardiac: There was no pericardial effusion.   Carotid Dopplers 08/11/2018   Summary:  Right Carotid: The extracranial vessels were near-normal with only minimal  wall                 thickening or plaque.   Left Carotid: Velocities in the left ICA are consistent with a 40-59%  stenosis.  Vertebrals:  Right vertebral artery demonstrates antegrade flow. Left  vertebral               - dampened.  Subclavians: Normal flow hemodynamics were seen in bilateral subclavian               arteries.   Right  ABI: Resting right ankle-brachial index is within normal range. No  evidence of significant right lower extremity arterial disease.  Left ABI: Resting left ankle-brachial index is within normal range. No  evidence of significant left lower extremity arterial disease.  Right Upper Extremity: Doppler waveforms remain within normal limits with  right radial compression. Doppler waveforms decrease 50% with right ulnar  compression.  Left Upper Extremity: Doppler waveforms remain within normal limits with  left radial compression. Doppler waveform obliterate with left ulnar  compression.   Cardiac cath 07/27/20:  LEFT HEART CATH AND CORONARY ANGIOGRAPHY    Conclusion    Mid LM to Dist LM lesion is 70% stenosed. Ost LAD to Prox LAD lesion is 50% stenosed. Prox LAD to Mid LAD lesion is 70% stenosed. 1st Mrg lesion is 50% stenosed. Dist Cx lesion is 30% stenosed with 30% stenosed side branch in LPAV. Ost RCA to Prox RCA lesion is 90% stenosed. The left ventricular systolic function is normal. LV end diastolic pressure is normal. The left ventricular ejection fraction is 55-65% by visual estimate.   1. 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 Normal LV function 3. Normal LVEDP   Plan: patient has high risk anatomy that is not suitable for PCI. Recommend CT surgery evaluation for CABG.    Diagnostic Dominance: Co-dominant    Intervention    ASSESSMENT AND PLAN:  1. Coronary artery disease-  Status post CABG x4 by Dr. Laneta Simmers on 08/15/2020.   Continue aspirin No beta blocker due to bradycardia.  Heart healthy low-sodium diet Increase physical activity as tolerated   2. Atrial fibrillation- post op CABG   resolved. No recurrence   3. Hyperlipidemia- unable to tolerate even low dose Crestor. Will update labs today. Refer to Pharm D for lipid management.   4. Carotid artery stenosis-carotid Doppler showed normal right carotid  artery with left 40-59% carotid  stenosis. Continue rosuvastatin, aspirin Repeat carotid Dopplers 1/23 showed no change Follow yearly  5. HTN - controlled.    Disposition:   FU with me in 6 months   Signed, Acelin Ferdig Swazil[place], MD  01/05/2022 9:14 AM    [facility] [address], [place], [place], [address] Phone [phone], Fax [phone]

## 2022-01-05 ENCOUNTER — Ambulatory Visit: Payer: Medicare Other | Admitting: Cardiology

## 2022-01-05 ENCOUNTER — Encounter: Payer: Self-pay | Admitting: Cardiology

## 2022-01-05 VITALS — BP 140/60 | HR 56 | Ht 65.0 in | Wt 151.8 lb

## 2022-01-05 DIAGNOSIS — I6522 Occlusion and stenosis of left carotid artery: Secondary | ICD-10-CM | POA: Diagnosis not present

## 2022-01-05 DIAGNOSIS — I2510 Atherosclerotic heart disease of native coronary artery without angina pectoris: Secondary | ICD-10-CM

## 2022-01-05 DIAGNOSIS — E7800 Pure hypercholesterolemia, unspecified: Secondary | ICD-10-CM

## 2022-01-05 LAB — COMPREHENSIVE METABOLIC PANEL
ALT: 12 IU/L (ref 0–32)
AST: 16 IU/L (ref 0–40)
Albumin/Globulin Ratio: 1.8 (ref 1.2–2.2)
Albumin: 4.6 g/dL (ref 3.7–4.7)
Alkaline Phosphatase: 72 IU/L (ref 44–121)
BUN/Creatinine Ratio: 26 (ref 12–28)
BUN: 23 mg/dL (ref 8–27)
Bilirubin Total: 0.5 mg/dL (ref 0.0–1.2)
CO2: 28 mmol/L (ref 20–29)
Calcium: 9.9 mg/dL (ref 8.7–10.3)
Chloride: 102 mmol/L (ref 96–106)
Creatinine, Ser: 0.89 mg/dL (ref 0.57–1.00)
Globulin, Total: 2.5 g/dL (ref 1.5–4.5)
Glucose: 103 mg/dL — ABNORMAL HIGH (ref 70–99)
Potassium: 4.5 mmol/L (ref 3.5–5.2)
Sodium: 140 mmol/L (ref 134–144)
Total Protein: 7.1 g/dL (ref 6.0–8.5)
eGFR: 68 mL/min/{1.73_m2} (ref >59)

## 2022-01-05 NOTE — Patient Instructions (Signed)
Medication Instructions:  Your physician recommends that you continue on your current medications as directed. Please refer to the Current Medication list given to you today.  *If you need a refill on your cardiac medications before your next appointment, please call your pharmacy*   Lab Work: Your physician recommends that you return for lab work in:  TODAY: CMET, Lipids If you have labs (blood work) drawn today and your tests are completely normal, you will receive your results only by: [place] (if you have [place]) OR A paper copy in the mail If you have any lab test that is abnormal or we need to change your treatment, we will call you to review the results.   Testing/Procedures: None   Follow-Up: At [facility], you and your health needs are our priority.  As part of our continuing mission to provide you with exceptional heart care, we have created designated Provider Care Teams.  These Care Teams include your primary Cardiologist (physician) and Advanced Practice Providers (APPs -  Physician Assistants and Nurse Practitioners) who all work together to provide you with the care you need, when you need it.  We recommend signing up for the patient portal called "MyChart".  Sign up information is provided on this After Visit Summary.  MyChart is used to connect with patients for Virtual Visits (Telemedicine).  Patients are able to view lab/test results, encounter notes, upcoming appointments, etc.  Non-urgent messages can be sent to your provider as well.   To learn more about what you can do with MyChart, go to [email or web address].    Your next appointment:   6 month(s)  The format for your next appointment:   In Person  Provider:   Peter Martinique, MD     Other Instructions   Important Information About Sugar

## 2022-01-10 ENCOUNTER — Other Ambulatory Visit: Payer: Self-pay

## 2022-01-10 DIAGNOSIS — E7800 Pure hypercholesterolemia, unspecified: Secondary | ICD-10-CM

## 2022-01-11 ENCOUNTER — Telehealth: Payer: Self-pay | Admitting: Orthopaedic Surgery

## 2022-01-11 NOTE — Telephone Encounter (Signed)
Will call patient to discuss, next available gel injection would need to be after 01/18/2022.

## 2022-01-11 NOTE — Telephone Encounter (Signed)
Pt called requesting a call back concerning gel injection. Pt would like to set an appt. Last seen 12/22 Please call pt at [phone].

## 2022-01-12 NOTE — Telephone Encounter (Signed)
Talked with patient and appt.has been scheduled.  VOB submitted for SynviscOne, bilateral knee.

## 2022-01-15 LAB — LIPID PANEL
Chol/HDL Ratio: 3.2 ratio (ref 0.0–4.4)
Cholesterol, Total: 232 mg/dL — ABNORMAL HIGH (ref 100–199)
HDL: 73 mg/dL (ref >39)
LDL Chol Calc (NIH): 149 mg/dL — ABNORMAL HIGH (ref 0–99)
Triglycerides: 61 mg/dL (ref 0–149)
VLDL Cholesterol Cal: 10 mg/dL (ref 5–40)

## 2022-01-16 ENCOUNTER — Encounter: Payer: Self-pay | Admitting: Pharmacist Clinician (PhC)/ Clinical Pharmacy Specialist

## 2022-01-16 ENCOUNTER — Ambulatory Visit: Payer: Medicare Other | Admitting: Pharmacist Clinician (PhC)/ Clinical Pharmacy Specialist

## 2022-01-16 DIAGNOSIS — E7849 Other hyperlipidemia: Secondary | ICD-10-CM | POA: Diagnosis not present

## 2022-01-16 MED ORDER — ATORVASTATIN CALCIUM 10 MG PO TABS: 10.0000 mg | ORAL_TABLET | Freq: Every day | ORAL | 3 refills | Status: DC

## 2022-01-16 NOTE — Patient Instructions (Signed)
Your Results:             Your most recent labs Goal  Total Cholesterol 232 < 200  Triglycerides 61 < 150  HDL (happy/good cholesterol) 73 > 40  LDL (lousy/bad cholesterol 149 < 55   Medication changes:  Start atorvastatin 10 mg once daily with food.  If this causes you any problems, please send me a MyChart message and we can get the insurance authorization for Repatha.   Lab orders:  We want to repeat labs after 2-3 months.  We will send you a lab order to remind you once we get closer to that time.    Patient Assistance:  The [employer] foundation offers assistance to help pay for medication copays.  They will cover copays for all cholesterol lowering meds, including statins, fibrates, omega-3 oils, ezetimibe, Repatha, Praluent, Nexletol, Nexlizet.  The cards are usually good for $2,500 or 12 months, whichever comes first. Go to healthwellfoundation.org Click on "Apply Now" Answer questions as to whom is applying (patient or representative) Your disease fund will be "hypercholesterolemia - Medicare access" They will ask questions about finances and which medications you are taking for cholesterol When you submit, the approval is usually within minutes.  You will need to print the card information from the site You will need to show this information to your pharmacy, they will bill your Medicare Part D plan first -then bill [employer] --for the copay.   You can also call them at [phone], although the hold times can be quite long.   Thank you for choosing [facility]

## 2022-01-16 NOTE — Progress Notes (Signed)
01/16/2022 Debbie Cardenas August 14, 1945 [id number]   HPI:  Debbie Cardenas is a 76 y.o. female patient of Dr Swaziland, who presents today for a lipid clinic evaluation.  See pertinent past medical history below.  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  AFib Post op from CABG, resolved  arthritis Degenerative - in both knees    Current Medications: none  Cholesterol Goals:  LDL< 55   Intolerant/previously tried: rosuvastatin - made her sick, nausea off/on, unable to get around  Family history: father had MI and died at 57; (had multiple siblings with MI as well); mother healthy, died at 99; 2 siblings died from heart disease;  another sister living with AF, heart disease, 2 sisters died from cancer  Diet: mostly home cooked now, not as much vegetables as would like; proteins good; not snacking  Exercise:  busy with taking care of husband - hospitalized x 3 weeks, now homebound caring for him  Labs: 01/15/22:  TC 232, TG 61, HDL 73, LDL 149  (baseline)   Current Outpatient Medications  Medication Sig Dispense Refill   atorvastatin (LIPITOR) 10 MG tablet Take 1 tablet (10 mg total) by mouth daily. 30 tablet 3   acetaminophen (TYLENOL) 650 MG CR tablet Take 1,300 mg by mouth 2 (two) times daily as needed for pain.     ALPRAZolam (XANAX) 0.25 MG tablet Take by mouth.     amLODipine (NORVASC) 5 MG tablet Take 1 tablet (5 mg total) by mouth daily. 90 tablet 3   aspirin EC 81 MG tablet Take 1 tablet (81 mg total) by mouth daily. Swallow whole. 30 tablet 11   Cholecalciferol (VITAMIN D) 50 MCG (2000 UT) tablet Take 2,000 Units by mouth daily.     hydrochlorothiazide (MICROZIDE) 12.5 MG capsule TAKE 1 CAPSULE BY MOUTH EVERY DAY 90 capsule 3   MAGNESIUM OXIDE PO Take 500 mg by mouth daily.     Current Facility-Administered Medications  Medication  Dose Route Frequency Provider Last Rate Last Admin   sodium chloride flush (NS) 0.9 % injection 3 mL  3 mL Intravenous Q12H Swaziland, Peter M, MD        Allergies  Allergen Reactions   Ephedrine     "My dentist told me to never let anyone give me this"   Ace Inhibitors Cough    Past Medical History:  Diagnosis Date   Anxiety    Arthritis    knees   Closed fracture of neck of left femur (HCC) 08/15/2018   Coronary artery disease    Hyperlipidemia    Hypertension    Pneumonia     There were no vitals taken for this visit.   Hyperlipidemia Patient with ASCVD and hyperlipidemia, LDL not at goal, currently at 149.  Has been unable to tolerate rosuvastatin due to severe nausea.  Because she has not tried any other statin medications, will have her try atorvastatin 10 mg once daily.  Is doubtful that this will get her to goal LDL, but will need to try before insurance will authorize PCSK-9i.  Did review information about Repatha (preferred on [facility] - Medicare) including mechanism of action, side effects and dosing instructions.   If she finds she cannot tolerate the atorvastatin we will start PA for Repatha.  If she does tolerate, will repeat labs in 2 months then likely start  Repatha to get to goal     Phillips Hay PharmD CPP [facility] [address] [place], [place] [address] [phone]

## 2022-01-16 NOTE — Assessment & Plan Note (Signed)
Patient with ASCVD and hyperlipidemia, LDL not at goal, currently at 149.  Has been unable to tolerate rosuvastatin due to severe nausea.  Because she has not tried any other statin medications, will have her try atorvastatin 10 mg once daily.  Is doubtful that this will get her to goal LDL, but will need to try before insurance will authorize PCSK-9i.  Did review information about Repatha (preferred on [facility] - Medicare) including mechanism of action, side effects and dosing instructions.   If she finds she cannot tolerate the atorvastatin we will start PA for Repatha.  If she does tolerate, will repeat labs in 2 months then likely start Repatha to get to goal

## 2022-01-22 ENCOUNTER — Other Ambulatory Visit: Payer: Self-pay

## 2022-01-22 DIAGNOSIS — M1711 Unilateral primary osteoarthritis, right knee: Secondary | ICD-10-CM

## 2022-01-22 DIAGNOSIS — M1712 Unilateral primary osteoarthritis, left knee: Secondary | ICD-10-CM

## 2022-01-23 ENCOUNTER — Ambulatory Visit: Payer: Medicare Other | Admitting: Orthopaedic Surgery

## 2022-01-23 ENCOUNTER — Encounter: Payer: Self-pay | Admitting: Orthopaedic Surgery

## 2022-01-23 DIAGNOSIS — M1712 Unilateral primary osteoarthritis, left knee: Secondary | ICD-10-CM | POA: Diagnosis not present

## 2022-01-23 DIAGNOSIS — M1711 Unilateral primary osteoarthritis, right knee: Secondary | ICD-10-CM | POA: Diagnosis not present

## 2022-01-23 DIAGNOSIS — M170 Bilateral primary osteoarthritis of knee: Secondary | ICD-10-CM

## 2022-01-23 MED ORDER — LIDOCAINE HCL 1 % IJ SOLN
3.0000 mL | INTRAMUSCULAR | Status: AC | PRN
  Administered 2022-01-23: 3 mL

## 2022-01-23 MED ORDER — HYLAN G-F 20 48 MG/6ML IX SOSY
48.0000 mg | PREFILLED_SYRINGE | INTRA_ARTICULAR | Status: AC | PRN
  Administered 2022-01-23: 48 mg via INTRA_ARTICULAR

## 2022-04-15 ENCOUNTER — Other Ambulatory Visit: Payer: Self-pay | Admitting: Cardiology

## 2022-04-30 IMAGING — CT CT HEART MORP W/ CTA COR W/ SCORE W/ CA W/CM &/OR W/O CM
1 series · 6 of 8 positions shown, 8 images · non-contrast
Comparison: None.
COMPARISON: None.

Addendum:
EXAM:
OVER-READ INTERPRETATION  CT CHEST

The following report is an over-read performed by radiologist Dr.
Tc Recai Oda [REDACTED] on 07/08/2020. This
over-read does not include interpretation of cardiac or coronary
anatomy or pathology. The coronary calcium score/coronary CTA
interpretation by the cardiologist is attached.
CLINICAL DATA: 74 year old with chest pain
Cardiac/Coronary  CTA
TECHNIQUE: The patient was scanned on a [employer] Force scanner.

[Series 480: — · 0.27mm/px · 6 of 8 slices shown, 8 images]
[im 2/8  vessel]
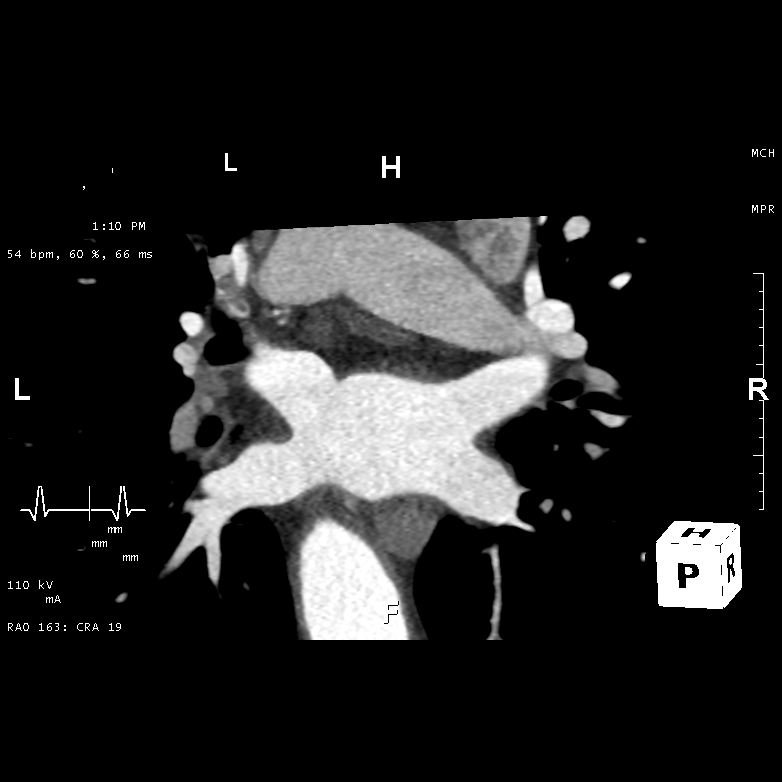
[im 2/8  lung]
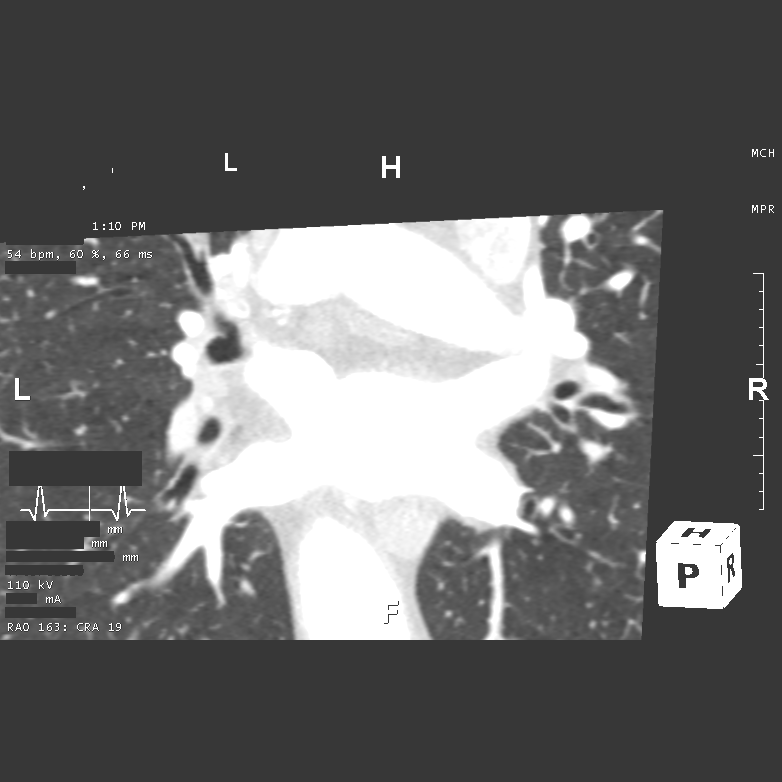
[im 3/8  vessel]
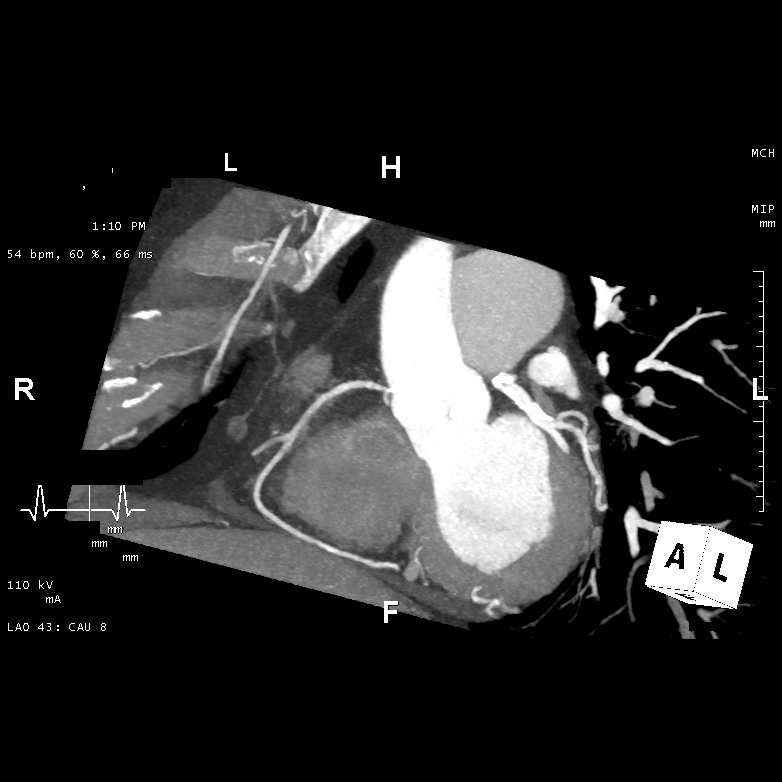
[im 4/8  vessel]
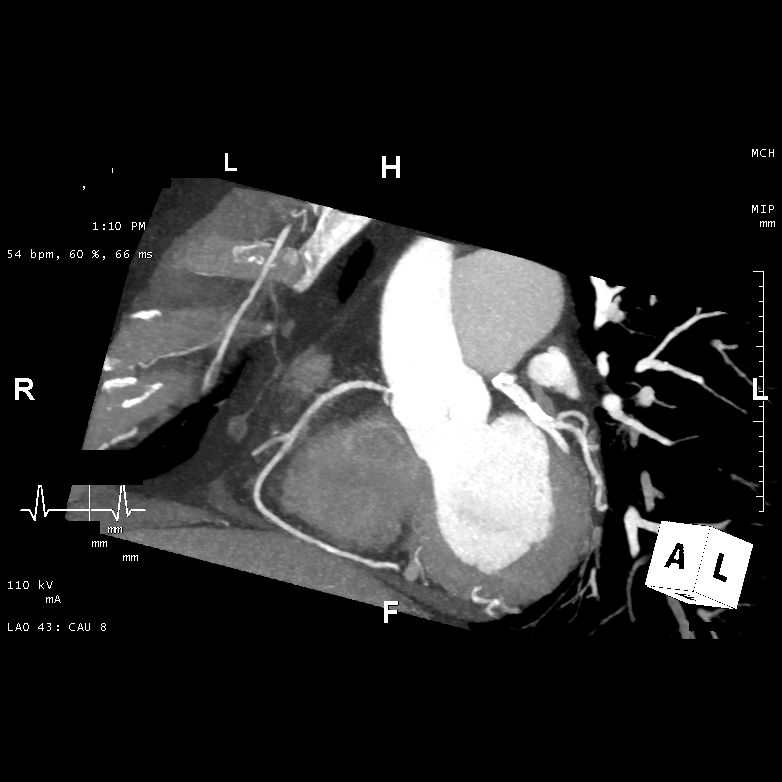
[im 5/8  vessel]
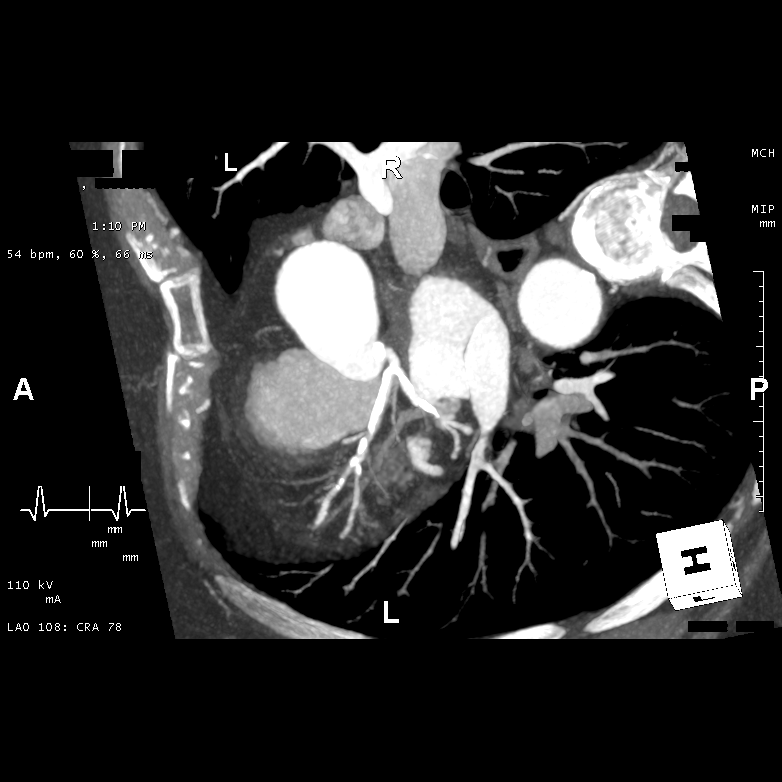
[im 6/8  vessel]
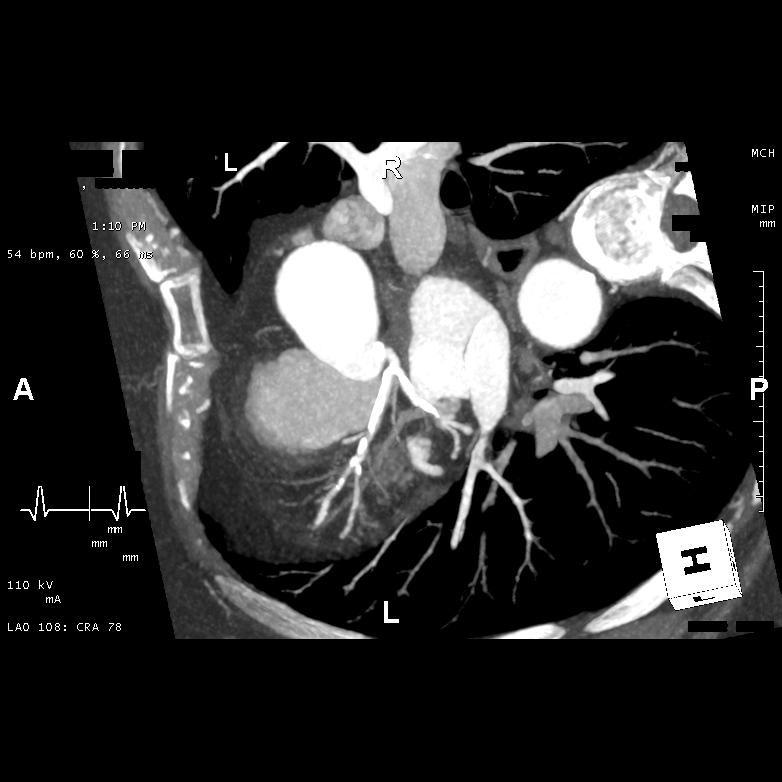
[im 6/8  lung]
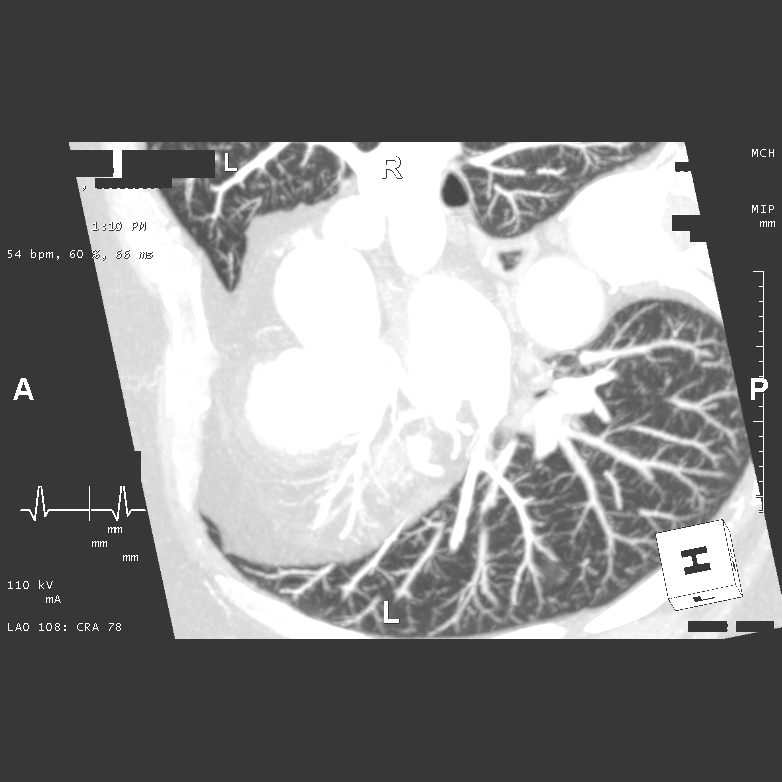
[im 7/8  vessel]
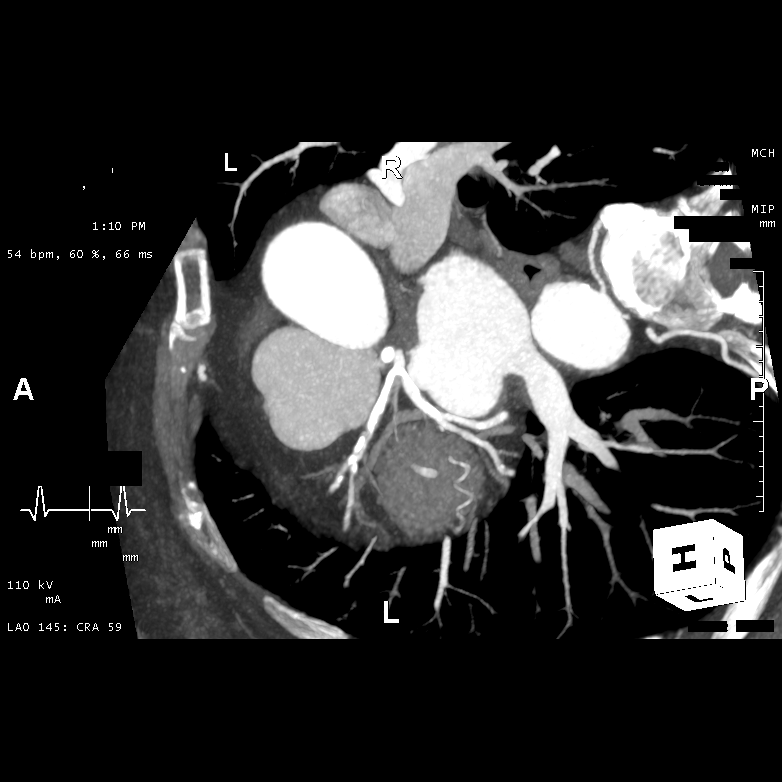

[6 of 8 positions shown; findings below may reference images not displayed]

FINDINGS: Aortic atherosclerosis. Within the visualized portions of the thorax
there are no suspicious appearing pulmonary nodules or masses, there
is no acute consolidative airspace disease, no pleural effusions, no
pneumothorax and no lymphadenopathy. Visualized portions of the
upper abdomen are unremarkable. There are no aggressive appearing
lytic or blastic lesions noted in the visualized portions of the
skeleton.
IMPRESSION: 1.  Aortic Atherosclerosis (4RBBV-VQY.Y).
FINDINGS: A 110 kV prospective scan was triggered in the descending thoracic
aorta at 111 HU's. Axial non-contrast 3 mm slices were carried out
through the heart. The data set was analyzed on a dedicated work
station and scored using the Agatson method. Gantry rotation speed
was 250 msecs and collimation was .6 mm. Beta blockade and 0.8 mg of
sl NTG was given. The 3D data set was reconstructed in 5% intervals
of the 67-82 % of the R-R cycle. Diastolic phases were analyzed on a
dedicated work station using MPR, MIP and VRT modes. The patient
received 80 cc of contrast.

Aorta: Normal size. Ascending and descending atherosclerosis. No
dissection.

Aortic Valve:  Trileaflet.  No calcifications.

Coronary Arteries:  Normal coronary origin.  Right dominance.

RCA is a small caliber (2.4 mm) dominant artery that gives rise to
PDA and PLA. There is ostial calcified plaque. Given density of
calcification, it is challenging to comment on ostial stenosis.

Left main is a large artery that gives rise to LAD and LCX arteries.
There is ostial mixed calcified and non calcified plaque. 0-24%
stenosis.

LAD is a large vessel that has no diffuse calcified plaque proximal
to mid.

There are regions of possible flow limiting disease >50% stenosis.
Sending for FFR analysis.

LCX is a non-dominant artery that gives rise to one large OM1
branch. There is diffuse calcified proximal plaque 25-49% stenosis.
Mid AV groove calcified plaque 0-24%.

Other findings:

Normal pulmonary vein drainage into the left atrium.

Normal left atrial appendage without a thrombus.

Mildly dilated pulmonary artery, 31 mm.

Please see radiology report for non cardiac findings.
IMPRESSION: 1. Coronary calcium score of 3827. This was 95 percentile for age
and sex matched control.

2. Normal coronary origin with right dominance.

3. Multivessel coronary artery calcified plaque. Possible flow
limitation ostial RCA (small caliber vessel) as well as LAD. Sending
for FFR analysis.

4.  Aortic atherosclerosis

5.  Mildly dilated pulmonary artery.

*** End of Addendum ***
EXAM:
OVER-READ INTERPRETATION  CT CHEST

The following report is an over-read performed by radiologist Dr.
Tc Recai Oda [REDACTED] on 07/08/2020. This
over-read does not include interpretation of cardiac or coronary
anatomy or pathology. The coronary calcium score/coronary CTA
interpretation by the cardiologist is attached.
FINDINGS: Aortic atherosclerosis. Within the visualized portions of the thorax
there are no suspicious appearing pulmonary nodules or masses, there
is no acute consolidative airspace disease, no pleural effusions, no
pneumothorax and no lymphadenopathy. Visualized portions of the
upper abdomen are unremarkable. There are no aggressive appearing
lytic or blastic lesions noted in the visualized portions of the
skeleton.
IMPRESSION: 1.  Aortic Atherosclerosis (4RBBV-VQY.Y).

## 2022-06-08 IMAGING — DX DG CHEST 1V PORT
1 series · 1 of 1 positions shown · non-contrast
Comparison: August 15, 2020

CLINICAL DATA: Pleural effusion.

EXAM:
PORTABLE CHEST 1 VIEW

[chest ap]
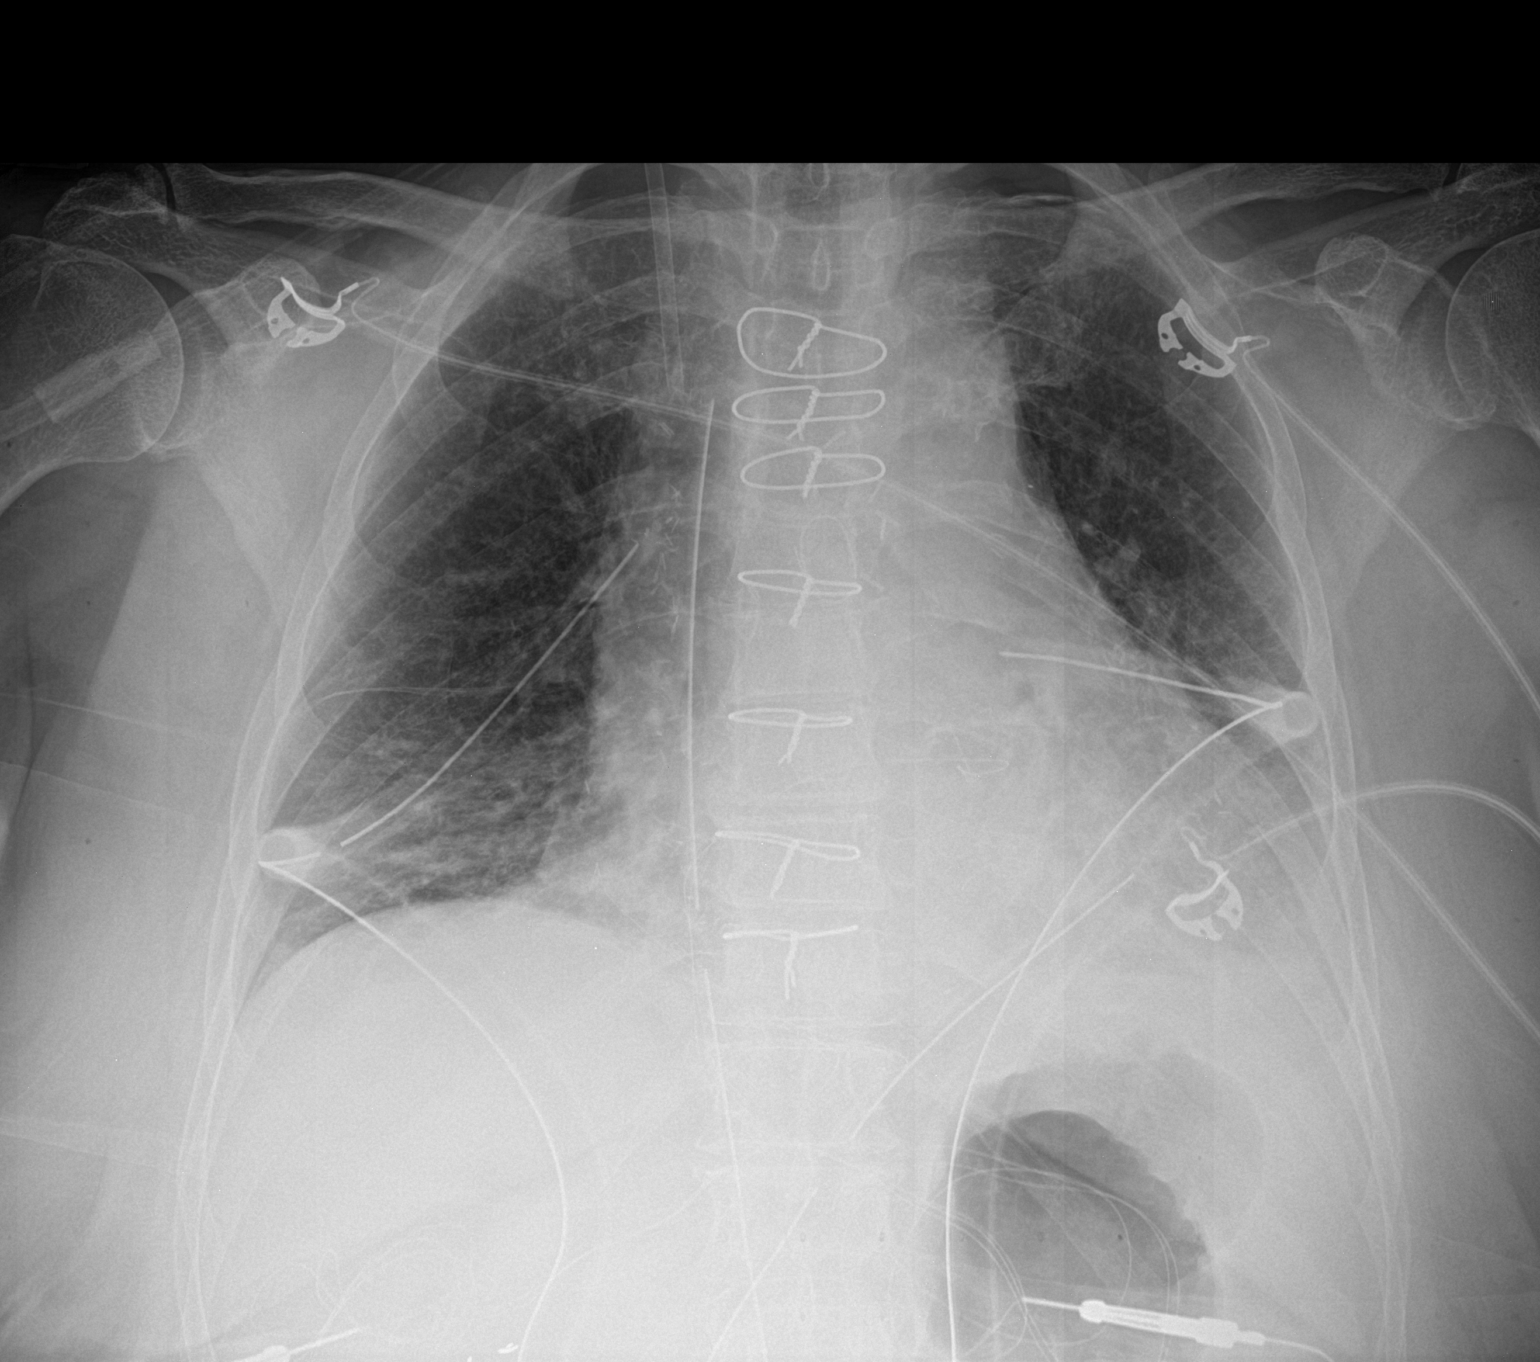

[1 of 1 positions shown; findings below may reference images not displayed]

FINDINGS: Chest tubes and mediastinal drains are again noted. The right-sided
central venous catheter is stable in positioning. There are trace
biapical pneumothoraces. The patient is status post prior CABG.
There is postoperative atelectasis at the lung bases with a probable
small left-sided pleural effusion.
IMPRESSION: 1. Lines and tubes as above.
2. Trace biapical pneumothoraces.

## 2022-07-15 ENCOUNTER — Other Ambulatory Visit: Payer: Self-pay | Admitting: Cardiology

## 2022-07-19 ENCOUNTER — Telehealth: Payer: Self-pay | Admitting: Orthopaedic Surgery

## 2022-07-19 NOTE — Telephone Encounter (Signed)
Noted.  Will submit.  

## 2022-07-19 NOTE — Telephone Encounter (Signed)
Patient would like Gel injection when they start back on 01/04 she has an appt on 01/10

## 2022-08-07 ENCOUNTER — Telehealth: Payer: Self-pay

## 2022-08-07 NOTE — Telephone Encounter (Signed)
VOB submitted for Monovisc, bilateral knee due to insurance change.

## 2022-08-08 ENCOUNTER — Ambulatory Visit (HOSPITAL_COMMUNITY)
Admission: RE | Admit: 2022-08-08 | Discharge: 2022-08-08 | Disposition: A | Discharge status: Home / Self Care | Payer: PPO | Source: Ambulatory Visit | Attending: Cardiology | Admitting: Cardiology

## 2022-08-08 ENCOUNTER — Ambulatory Visit (INDEPENDENT_AMBULATORY_CARE_PROVIDER_SITE_OTHER): Payer: PPO

## 2022-08-08 ENCOUNTER — Ambulatory Visit: Payer: Self-pay

## 2022-08-08 ENCOUNTER — Encounter: Payer: Self-pay | Admitting: Orthopaedic Surgery

## 2022-08-08 ENCOUNTER — Other Ambulatory Visit: Payer: Self-pay

## 2022-08-08 ENCOUNTER — Ambulatory Visit (INDEPENDENT_AMBULATORY_CARE_PROVIDER_SITE_OTHER): Payer: PPO | Admitting: Orthopaedic Surgery

## 2022-08-08 DIAGNOSIS — I6522 Occlusion and stenosis of left carotid artery: Secondary | ICD-10-CM | POA: Insufficient documentation

## 2022-08-08 DIAGNOSIS — M1712 Unilateral primary osteoarthritis, left knee: Secondary | ICD-10-CM | POA: Diagnosis not present

## 2022-08-08 DIAGNOSIS — M1711 Unilateral primary osteoarthritis, right knee: Secondary | ICD-10-CM

## 2022-08-08 NOTE — Progress Notes (Signed)
The patient is a very well-known to me.  I have replaced her left hip before.  We have been seeing her for a long period time with severe end-stage arthritis of both her knees.  At this point her right knee hurts worse than her left and it is detrimentally affecting her mobility, her quality of life and actives daily living.  She was originally here today to have Monovisc injections again her knees but she said the last ones have not helped her at all and steroid injections and not helped at all.  This is affecting her on a daily life in terms of her mobility.  This is getting worse.  She is having trouble going up and down stairs.  She is 77 years old.  She is not on blood thinning medication.  Her hip surgery was successful several years ago.  She currently denies any headache, chest pain, shortness of breath, fever, chills, nausea, vomiting.  I was able to review all of her notes including medications and past medical history in epic.  Examination both knees she has a mild effusion of both knees.  She has prominent medial epicondyle bilaterally and significant and severe patellofemoral grinding throughout the arc of motion of both knees.  Both knees have just slight varus malalignment but is only slight.  Both knees have a mild effusion but severe medial and lateral joint line tenderness as well.  X-rays of both knees show severe end-stage arthritis with bone-on-bone wear of all 3 compartments including large osteophytes in all 3 compartments.  At this point I did show her knee replacement model we talked in detail about what this type of surgery involves.  The discussion was made of risk and benefits of surgery.  I talked about what to expect from an intraoperative and postoperative course.  We went over her x-rays in detail.  All question concerns were answered and addressed.  At this point given the failure conservative treatment for many years now she would like to proceed with a right knee replacement  first and I agree with this as well.  We will work on getting her scheduled.

## 2022-08-09 ENCOUNTER — Other Ambulatory Visit: Payer: Self-pay

## 2022-08-09 ENCOUNTER — Telehealth: Payer: Self-pay | Admitting: Orthopaedic Surgery

## 2022-08-09 ENCOUNTER — Other Ambulatory Visit: Payer: Self-pay | Admitting: Orthopaedic Surgery

## 2022-08-09 DIAGNOSIS — M1711 Unilateral primary osteoarthritis, right knee: Secondary | ICD-10-CM

## 2022-08-09 DIAGNOSIS — M1712 Unilateral primary osteoarthritis, left knee: Secondary | ICD-10-CM

## 2022-08-09 MED ORDER — ACETAMINOPHEN-CODEINE 300-30 MG PO TABS: 1.0000 | ORAL_TABLET | Freq: Three times a day (TID) | ORAL | 0 refills | Status: DC | PRN

## 2022-08-09 NOTE — Telephone Encounter (Signed)
Called and advised pt.

## 2022-08-09 NOTE — Telephone Encounter (Signed)
Patient called advised she went to the pharmacy and the Rx for pain medication was not there. Patient asked for a call when Rx Tylenol 3  with codeine is sent to the pharmacy. The number to contact patient is [phone]

## 2022-08-17 ENCOUNTER — Other Ambulatory Visit: Payer: Self-pay | Admitting: Cardiology

## 2022-08-20 ENCOUNTER — Telehealth: Payer: Self-pay | Admitting: *Deleted

## 2022-08-20 NOTE — Telephone Encounter (Signed)
   Pre-operative Risk Assessment    Patient Name: Debbie Cardenas  DOB: 1945/10/07 MRN: [id number]      Request for Surgical Clearance    Procedure:   RIGHT TOTAL KNEE ARTHROPLASTY  Date of Surgery:  Clearance TBD                                 Surgeon:  Jean Rosenthal, MD Surgeon's Group or Practice Name:  [facility] Phone number:  [phone] Fax number:  [phone]   Type of Clearance Requested:   - Medical  - Pharmacy:  Hold Aspirin NOT INDICATED HOW LONG   Type of Anesthesia:   SPINAL & BLOCK   Additional requests/questions:    Astrid Divine   08/20/2022, 10:57 AM

## 2022-08-20 NOTE — Telephone Encounter (Signed)
   Name: Debbie Cardenas  DOB: Dec 23, 1945  MRN: [id number]  Primary Cardiologist: Peter Martinique, MD  Chart reviewed as part of pre-operative protocol coverage. The patient has an upcoming visit scheduled with Dr. Martinique on 09/03/2022 at which time clearance can be addressed in case there are any issues that would impact surgical recommendations.  Right total knee arthroplasty is not scheduled until TBD as below. I added preop FYI to appointment note so that provider is aware to address at time of outpatient visit.  Per office protocol the cardiology provider should forward their finalized clearance decision and recommendations regarding antiplatelet therapy to the requesting party below.    I will route this message as FYI to requesting party and remove this message from the preop box as separate preop APP input not needed at this time.   Please call with any questions.  Lenna Sciara, NP  08/20/2022, 11:50 AM

## 2022-08-27 NOTE — Progress Notes (Signed)
Cardiology Office Note   Date:  09/03/2022   ID:  REESE SENK, DOB 1945-08-23, MRN [id number]  PCP:  Harrison Mons, [place]  Cardiologist:   Ophelia Sipe Martinique, MD   Chief Complaint  Patient presents with   Coronary Artery Disease       History of Present Illness: NYANNA HEIDEMAN is a 77 y.o. female who presents for follow up of Afib and CAD. She needs surgical clearance for TKR. She has a PMH of hypertension, hyperlipidemia, degenerative arthritis of both knees, and a family history of coronary artery disease.  She is a former smoker.  She underwent coronary CTA which showed a calcium score over 1000 and stenosis that was flow-limiting in the RCA and LAD.  It was felt to be high risk. She had a cardiac event monitor which showed normal sinus rhythm.  She underwent cardiac catheterization 07/08/2020 which showed 70% mid-distal left main stenosis.  50% ostial-proximal LAD stenosis and 70% proximal mid LAD stenosis, moderate size diagonal branch was noted off the area of stenosis, first marginal was noted to have 50% stenosis, ostium of RCA had 90% stenosis, and LV EF was 55-60% with normal LVEDP.    She discussed coronary artery bypass graft with Dr. [name].   Her preoperative carotid duplex showed no significant right ICA stenosis and 40-59% left ICA stenosis.  She underwent successful CABG x 4 on 08/05/20.  She was discharged 08/24/2020.  She developed postoperative atrial fibrillation on day 5.  She was discharged on Eliquis and amiodarone.  When seen for follow up she was in NSR.    She has since been off amiodarone. She was unable to take Crestor even at 5 mg daily. States it made her sick. Her husband has been ill and had major abdominal surgery in April. This has been stressful for her. She denies any angina or dyspnea. Rare palpitations mostly at night.  She was seen in the lipid clinic. Lipitor 10 mg was recommended and if unable to tolerate then begin Repatha. She has been on lipitor now  for 6 months. Has some nausea otherwise tolerating OK  She is planning to have TKR and needs surgical clearance. She has been inactive and gained 8 lbs.     Past Medical History:  Diagnosis Date   Anxiety    Arthritis    knees   Closed fracture of neck of left femur ([place]) 08/15/2018   Coronary artery disease    Hyperlipidemia    Hypertension    Pneumonia     Past Surgical History:  Procedure Laterality Date   APPENDECTOMY     CARDIAC CATHETERIZATION     CATARACT EXTRACTION, BILATERAL     CHOLECYSTECTOMY     CORONARY ARTERY BYPASS GRAFT N/A 08/15/2020   Procedure: CORONARY ARTERY BYPASS GRAFTING (CABG)X4. USING BILATERAL MAMMARY ARTERIES AND RIGHT GREATER SAPHENOUS VEIN HARVESTED ENDOSCOPICALLY.;  Surgeon: Gaye Pollack, MD;  Location: [facility];  Service: Open Heart Surgery;  Laterality: N/A;   KNEE SURGERY     LEFT HEART CATH AND CORONARY ANGIOGRAPHY N/A 07/27/2020   Procedure: LEFT HEART CATH AND CORONARY ANGIOGRAPHY;  Surgeon: Martinique, Josephus Harriger M, MD;  Location: [facility];  Service: Cardiovascular;  Laterality: N/A;   TEE WITHOUT CARDIOVERSION N/A 08/15/2020   Procedure: TRANSESOPHAGEAL ECHOCARDIOGRAM (TEE);  Surgeon: Gaye Pollack, MD;  Location: [facility];  Service: Open Heart Surgery;  Laterality: N/A;   TOTAL HIP ARTHROPLASTY Left 08/15/2018   Procedure: TOTAL HIP ARTHROPLASTY ANTERIOR  APPROACH;  Surgeon: Mcarthur Rossetti, MD;  Location: [facility];  Service: Orthopedics;  Laterality: Left;     Current Outpatient Medications  Medication Sig Dispense Refill   acetaminophen (TYLENOL) 650 MG CR tablet Take 1,300 mg by mouth 2 (two) times daily as needed for pain.     acetaminophen-codeine (TYLENOL #3) 300-30 MG tablet Take 1-2 tablets by mouth every 8 (eight) hours as needed for moderate pain. 30 tablet 0   amLODipine (NORVASC) 5 MG tablet TAKE 1 TABLET (5 MG TOTAL) BY MOUTH DAILY. 90 tablet 3   aspirin EC 81 MG tablet Take 1 tablet (81 mg total) by mouth daily. Swallow  whole. 30 tablet 11   atorvastatin (LIPITOR) 10 MG tablet TAKE 1 TABLET BY MOUTH EVERY DAY 90 tablet 1   Cholecalciferol (VITAMIN D) 50 MCG (2000 UT) tablet Take 2,000 Units by mouth daily.     hydrochlorothiazide (MICROZIDE) 12.5 MG capsule TAKE 1 CAPSULE BY MOUTH EVERY DAY 90 capsule 3   MAGNESIUM OXIDE PO Take 500 mg by mouth daily.     SODIUM FLUORIDE 5000 PPM 1.1 % PSTE SMARTSIG:Sparingly By Mouth     Current Facility-Administered Medications  Medication Dose Route Frequency Provider Last Rate Last Admin   sodium chloride flush (NS) 0.9 % injection 3 mL  3 mL Intravenous Q12H Martinique, Sailor Hevia M, MD        Allergies:   Ephedrine and Ace inhibitors    Social History:  The patient  reports that she quit smoking about 8 years ago. Her smoking use included cigarettes. She has never used smokeless tobacco. She reports current alcohol use. She reports that she does not use drugs.   Family History:  The patient's family history includes Cancer in her sister; Heart attack in her brother, brother, and father; Heart disease in her brother, brother, sister, and sister.    ROS:  Please see the history of present illness.   Otherwise, review of systems are positive for none.   All other systems are reviewed and negative.    PHYSICAL EXAM: VS:  BP (!) 140/68   Pulse 90   Ht 5\' 5"  (1.651 m)   Wt 159 lb 12.8 oz (72.5 kg)   SpO2 99%   BMI 26.59 kg/m  , BMI Body mass index is 26.59 kg/m. GEN: Well nourished, well developed, in no acute distress  HEENT: normal  Neck: no JVD, carotid bruits, or masses Cardiac: RRR; no murmurs, rubs, or gallops,no edema  Respiratory:  clear to auscultation bilaterally, normal work of breathing GI: soft, nontender, nondistended, + BS MS: no deformity or atrophy  Skin: warm and dry, no rash Neuro:  Strength and sensation are intact Psych: euthymic mood, full affect   EKG:  EKG is ordered today. The ekg ordered today NSR rate 90. One PAC. Otherwise  Normal. I  have personally reviewed and interpreted this study.    Recent Labs: 01/05/2022: ALT 12; BUN 23; Creatinine, Ser 0.89; Potassium 4.5; Sodium 140    Lipid Panel    Component Value Date/Time   CHOL 232 (H) 01/15/2022 0944   TRIG 61 01/15/2022 0944   HDL 73 01/15/2022 0944   CHOLHDL 3.2 01/15/2022 0944   CHOLHDL 4.0 08/15/2018 1502   VLDL 12 08/15/2018 1502   LDLCALC 149 (H) 01/15/2022 0944    Labs dated 03/29/21: cholesterol 169, triglycerides 74, HDL 71, LDL 84. CMET normal.  Dated 04/27/21: CBC normal.  Wt Readings from Last 3 Encounters:  09/03/22 159 lb  12.8 oz (72.5 kg)  01/05/22 151 lb 12.8 oz (68.9 kg)  06/09/21 149 lb 12.8 oz (67.9 kg)      Other studies Reviewed: Additional studies/ records that were reviewed today include:   Echocardiogram 08/16/18   Study Conclusions   - Left ventricle: The cavity size was normal. There was mild    concentric hypertrophy. Systolic function was normal. The    estimated ejection fraction was in the range of 60% to 65%. Wall    motion was normal; there were no regional wall motion    abnormalities. Doppler parameters are consistent with abnormal    left ventricular relaxation (grade 1 diastolic dysfunction).    There was no evidence of elevated ventricular filling pressure by    Doppler parameters.  - Aortic valve: Valve area (VTI): 2.27 cm^2. Valve area (Vmax): 2.1    cm^2. Valve area (Vmean): 2.24 cm^2.  - Mitral valve: Valve area by pressure half-time: 2.44 cm^2.  - Right ventricle: Systolic function was normal.  - Right atrium: The atrium was normal in size.  - Tricuspid valve: There was no regurgitation.  - Pulmonary arteries: Systolic pressure was within the normal    range.  - Inferior vena cava: The vessel was normal in size.  - Pericardium, extracardiac: There was no pericardial effusion.   Carotid Dopplers 08/11/2018   Summary:  Right Carotid: The extracranial vessels were near-normal with only minimal  wall                  thickening or plaque.   Left Carotid: Velocities in the left ICA are consistent with a 40-59%  stenosis.  Vertebrals:  Right vertebral artery demonstrates antegrade flow. Left  vertebral               - dampened.  Subclavians: Normal flow hemodynamics were seen in bilateral subclavian               arteries.   Right ABI: Resting right ankle-brachial index is within normal range. No  evidence of significant right lower extremity arterial disease.  Left ABI: Resting left ankle-brachial index is within normal range. No  evidence of significant left lower extremity arterial disease.  Right Upper Extremity: Doppler waveforms remain within normal limits with  right radial compression. Doppler waveforms decrease 50% with right ulnar  compression.  Left Upper Extremity: Doppler waveforms remain within normal limits with  left radial compression. Doppler waveform obliterate with left ulnar  compression.   Cardiac cath 07/27/20:  LEFT HEART CATH AND CORONARY ANGIOGRAPHY    Conclusion    Mid LM to Dist LM lesion is 70% stenosed. Ost LAD to Prox LAD lesion is 50% stenosed. Prox LAD to Mid LAD lesion is 70% stenosed. 1st Mrg lesion is 50% stenosed. Dist Cx lesion is 30% stenosed with 30% stenosed side branch in LPAV. Ost RCA to Prox RCA lesion is 90% stenosed. The left ventricular systolic function is normal. LV end diastolic pressure is normal. The left ventricular ejection fraction is 55-65% by visual estimate.   1. 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 Normal LV function 3. Normal LVEDP   Plan: patient has high risk anatomy that is not suitable for PCI. Recommend CT surgery evaluation for CABG.    Diagnostic Dominance: Co-dominant    Intervention    ASSESSMENT AND PLAN:  1. Coronary artery disease-  Status post CABG x4 by Dr. Laneta Simmers on 08/15/2020.  Continue aspirin No beta blocker due to history of  bradycardia.  Heart healthy low-sodium diet She is cleared for TKR from a cardiac standpoint.  No SBE prophylaxis required.   2. Atrial fibrillation- post op CABG   resolved. No recurrence   3. Hyperlipidemia- unable to tolerate even low dose Crestor. Now on lipitor 10 mg daily. Will update labs. If not at goal consider PCSK 9 inhibitor.   4. Carotid artery stenosis-carotid Doppler showed normal right carotid artery with left 40-59% carotid stenosis. Continue rosuvastatin, aspirin Repeat carotid Dopplers 1/24 showed no change Follow yearly  5. HTN -elevated today but has been well controlled at home    Disposition:   FU with me in 6 months   Signed, Tailor Lucking Martinique, MD  09/03/2022 10:39 AM    [facility] [address], [place], [place], [address] Phone [phone], Fax [phone]

## 2022-09-03 ENCOUNTER — Ambulatory Visit: Payer: HMO | Attending: Cardiology | Admitting: Cardiology

## 2022-09-03 ENCOUNTER — Encounter: Payer: Self-pay | Admitting: Cardiology

## 2022-09-03 VITALS — BP 140/68 | HR 90 | Ht 65.0 in | Wt 159.8 lb

## 2022-09-03 DIAGNOSIS — E7800 Pure hypercholesterolemia, unspecified: Secondary | ICD-10-CM

## 2022-09-03 DIAGNOSIS — I2510 Atherosclerotic heart disease of native coronary artery without angina pectoris: Secondary | ICD-10-CM | POA: Diagnosis not present

## 2022-09-03 DIAGNOSIS — E7849 Other hyperlipidemia: Secondary | ICD-10-CM | POA: Diagnosis not present

## 2022-09-03 DIAGNOSIS — I10 Essential (primary) hypertension: Secondary | ICD-10-CM | POA: Diagnosis not present

## 2022-09-03 NOTE — Patient Instructions (Addendum)
Medication Instructions:  No Changes *If you need a refill on your cardiac medications before your next appointment, please call your pharmacy*   Lab Work: Your physician recommends that you have labs done TODAY. If you have labs (blood work) drawn today and your tests are completely normal, you will receive your results only by: [facility] (if you have MyChart) OR A paper copy in the mail If you have any lab test that is abnormal or we need to change your treatment, we will call you to review the results.   Testing/Procedures: None Ordered  Follow-Up: At [facility], you and your health needs are our priority.  As part of our continuing mission to provide you with exceptional heart care, we have created designated Provider Care Teams.  These Care Teams include your primary Cardiologist (physician) and Advanced Practice Providers (APPs -  Physician Assistants and Nurse Practitioners) who all work together to provide you with the care you need, when you need it.  Your next appointment:   1 year(s)  Provider:   Peter Martinique, MD   Other Instructions

## 2022-09-04 LAB — LIPID PANEL
Chol/HDL Ratio: 2.6 ratio (ref 0.0–4.4)
Cholesterol, Total: 155 mg/dL (ref 100–199)
HDL: 60 mg/dL (ref >39)
LDL Chol Calc (NIH): 81 mg/dL (ref 0–99)
Triglycerides: 69 mg/dL (ref 0–149)
VLDL Cholesterol Cal: 14 mg/dL (ref 5–40)

## 2022-09-04 LAB — COMPREHENSIVE METABOLIC PANEL
ALT: 14 IU/L (ref 0–32)
AST: 20 IU/L (ref 0–40)
Albumin/Globulin Ratio: 1.8 (ref 1.2–2.2)
Albumin: 4.6 g/dL (ref 3.8–4.8)
Alkaline Phosphatase: 76 IU/L (ref 44–121)
BUN/Creatinine Ratio: 28 (ref 12–28)
BUN: 21 mg/dL (ref 8–27)
Bilirubin Total: 0.7 mg/dL (ref 0.0–1.2)
CO2: 26 mmol/L (ref 20–29)
Calcium: 9.5 mg/dL (ref 8.7–10.3)
Chloride: 102 mmol/L (ref 96–106)
Creatinine, Ser: 0.76 mg/dL (ref 0.57–1.00)
Globulin, Total: 2.5 g/dL (ref 1.5–4.5)
Glucose: 109 mg/dL — ABNORMAL HIGH (ref 70–99)
Potassium: 4 mmol/L (ref 3.5–5.2)
Sodium: 140 mmol/L (ref 134–144)
Total Protein: 7.1 g/dL (ref 6.0–8.5)
eGFR: 82 mL/min/{1.73_m2} (ref >59)

## 2022-09-13 ENCOUNTER — Other Ambulatory Visit: Payer: Self-pay

## 2022-09-20 ENCOUNTER — Other Ambulatory Visit: Payer: Self-pay | Admitting: Physician Assistant

## 2022-09-20 DIAGNOSIS — Z01818 Encounter for other preprocedural examination: Secondary | ICD-10-CM

## 2022-09-25 NOTE — Progress Notes (Signed)
Surgical Instructions    Your procedure is scheduled on Tuesday, 10/02/22.  Report to [facility] Main Entrance "A" at 10:00 A.M., then check in with the Admitting office.  Call this number if you have problems the morning of surgery:  [phone]   If you have any questions prior to your surgery date call [phone]: Open Monday-Friday 8am-4pm If you experience any cold or flu symptoms such as cough, fever, chills, shortness of breath, etc. between now and your scheduled surgery, please notify us at the above number     Remember:  Do not eat after midnight the night before your surgery  You may drink clear liquids until 9:00am the morning of your surgery.   Clear liquids allowed are: Water, Non-Citrus Juices (without pulp), Carbonated Beverages, Clear Tea, Black Coffee ONLY (NO MILK, CREAM OR POWDERED CREAMER of any kind), and Gatorade  Patient Instructions  The night before surgery:  No food after midnight. ONLY clear liquids after midnight  The day of surgery (if you do NOT have diabetes):  Drink ONE (1) Pre-Surgery Clear Ensure by 9:00am the morning of surgery. Drink in one sitting. Do not sip.  This drink was given to you during your hospital  pre-op appointment visit. Nothing else to drink after completing the  Pre-Surgery Clear Ensure.           If you have questions, please contact your surgeon's office.     Take these medicines the morning of surgery with A SIP OF WATER:  amLODipine (NORVASC)  atorvastatin (LIPITOR)   IF NEEDED: acetaminophen (TYLENOL)  acetaminophen-codeine (TYLENOL #3)   As of today, STOP taking any Aspirin (unless otherwise instructed by your surgeon) Aleve, Naproxen, Ibuprofen, Motrin, Advil, Goody's, BC's, all herbal medications, fish oil, and all vitamins.           Do not wear jewelry or makeup. Do not wear lotions, powders, perfumes or deodorant. Do not shave 48 hours prior to surgery.   Do not bring valuables to the hospital. Do not  wear nail polish, gel polish, artificial nails, or any other type of covering on natural nails (fingers and toes) If you have artificial nails or gel coating that need to be removed by a nail salon, please have this removed prior to surgery. Artificial nails or gel coating may interfere with anesthesia's ability to adequately monitor your vital signs.  [facility] is not responsible for any belongings or valuables.    Do NOT Smoke (Tobacco/Vaping)  24 hours prior to your procedure  If you use a CPAP at night, you may bring your mask for your overnight stay.   Contacts, glasses, hearing aids, dentures or partials may not be worn into surgery, please bring cases for these belongings   For patients admitted to the hospital, discharge time will be determined by your treatment team.   Patients discharged the day of surgery will not be allowed to drive home, and someone needs to stay with them for 24 hours.   SURGICAL WAITING ROOM VISITATION Patients having surgery or a procedure may have no more than 2 support people in the waiting area - these visitors may rotate.   Children under the age of 68 must have an adult with them who is not the patient. If the patient needs to stay at the hospital during part of their recovery, the visitor guidelines for inpatient rooms apply. Pre-op nurse will coordinate an appropriate time for 1 support person to accompany patient in pre-op.  This support  person may not rotate.   Please refer to [email or web address] for the visitor guidelines for Inpatients (after your surgery is over and you are in a regular room).    Special instructions:    Oral Hygiene is also important to reduce your risk of infection.  Remember - BRUSH YOUR TEETH THE MORNING OF SURGERY WITH YOUR REGULAR TOOTHPASTE   [facility]- Preparing For Surgery  Before surgery, you can play an important role. Because skin is not sterile, your skin  needs to be as free of germs as possible. You can reduce the number of germs on your skin by washing with CHG (chlorahexidine gluconate) Soap before surgery.  CHG is an antiseptic cleaner which kills germs and bonds with the skin to continue killing germs even after washing.     Please do not use if you have an allergy to CHG or antibacterial soaps. If your skin becomes reddened/irritated stop using the CHG.  Do not shave (including legs and underarms) for at least 48 hours prior to first CHG shower. It is OK to shave your face.  Please follow these instructions carefully.     Shower the NIGHT BEFORE SURGERY and the MORNING OF SURGERY with CHG Soap.   If you chose to wash your hair, wash your hair first as usual with your normal shampoo. After you shampoo, rinse your hair and body thoroughly to remove the shampoo.  Then [employer] and genitals (private parts) with your normal soap and rinse thoroughly to remove soap.  After that Use CHG Soap as you would any other liquid soap. You can apply CHG directly to the skin and wash gently with a scrungie or a clean washcloth.   Apply the CHG Soap to your body ONLY FROM THE NECK DOWN.  Do not use on open wounds or open sores. Avoid contact with your eyes, ears, mouth and genitals (private parts). Wash Face and genitals (private parts)  with your normal soap.   Wash thoroughly, paying special attention to the area where your surgery will be performed.  Thoroughly rinse your body with warm water from the neck down.  DO NOT shower/wash with your normal soap after using and rinsing off the CHG Soap.  Pat yourself dry with a CLEAN TOWEL.  Wear CLEAN PAJAMAS to bed the night before surgery  Place CLEAN SHEETS on your bed the night before your surgery  DO NOT SLEEP WITH PETS.   Day of Surgery: Take a shower with CHG soap. Wear Clean/Comfortable clothing the morning of surgery Do not apply any deodorants/lotions.   Remember to brush your teeth WITH  YOUR REGULAR TOOTHPASTE.    If you received a COVID test during your pre-op visit, it is requested that you wear a mask when out in public, stay away from anyone that may not be feeling well, and notify your surgeon if you develop symptoms. If you have been in contact with anyone that has tested positive in the last 10 days, please notify your surgeon.    Please read over the following fact sheets that you were given.

## 2022-09-26 ENCOUNTER — Encounter (HOSPITAL_COMMUNITY): Payer: Self-pay

## 2022-09-26 ENCOUNTER — Other Ambulatory Visit: Payer: Self-pay

## 2022-09-26 ENCOUNTER — Encounter (HOSPITAL_COMMUNITY)
Admission: RE | Admit: 2022-09-26 | Discharge: 2022-09-26 | Disposition: A | Discharge status: Home / Self Care | Payer: HMO | Source: Ambulatory Visit | Attending: Orthopaedic Surgery | Admitting: Orthopaedic Surgery

## 2022-09-26 VITALS — BP 145/60 | HR 60 | Temp 98.2°F | Resp 17 | Ht 65.0 in | Wt 156.0 lb

## 2022-09-26 DIAGNOSIS — R001 Bradycardia, unspecified: Secondary | ICD-10-CM | POA: Diagnosis not present

## 2022-09-26 DIAGNOSIS — Z7982 Long term (current) use of aspirin: Secondary | ICD-10-CM | POA: Diagnosis not present

## 2022-09-26 DIAGNOSIS — I2510 Atherosclerotic heart disease of native coronary artery without angina pectoris: Secondary | ICD-10-CM | POA: Diagnosis not present

## 2022-09-26 DIAGNOSIS — Z01818 Encounter for other preprocedural examination: Secondary | ICD-10-CM | POA: Insufficient documentation

## 2022-09-26 DIAGNOSIS — E876 Hypokalemia: Secondary | ICD-10-CM | POA: Diagnosis not present

## 2022-09-26 DIAGNOSIS — I10 Essential (primary) hypertension: Secondary | ICD-10-CM | POA: Diagnosis not present

## 2022-09-26 DIAGNOSIS — E785 Hyperlipidemia, unspecified: Secondary | ICD-10-CM | POA: Insufficient documentation

## 2022-09-26 DIAGNOSIS — Z951 Presence of aortocoronary bypass graft: Secondary | ICD-10-CM | POA: Insufficient documentation

## 2022-09-26 LAB — TYPE AND SCREEN
ABO/RH(D): O POS
Antibody Screen: NEGATIVE

## 2022-09-26 LAB — COMPREHENSIVE METABOLIC PANEL
ALT: 14 U/L (ref 0–44)
AST: 19 U/L (ref 15–41)
Albumin: 4.1 g/dL (ref 3.5–5.0)
Alkaline Phosphatase: 61 U/L (ref 38–126)
Anion gap: 11 (ref 5–15)
BUN: 18 mg/dL (ref 8–23)
CO2: 28 mmol/L (ref 22–32)
Calcium: 9.4 mg/dL (ref 8.9–10.3)
Chloride: 103 mmol/L (ref 98–111)
Creatinine, Ser: 1.01 mg/dL — ABNORMAL HIGH (ref 0.44–1.00)
GFR, Estimated: 58 mL/min — ABNORMAL LOW (ref >60)
Glucose, Bld: 105 mg/dL — ABNORMAL HIGH (ref 70–99)
Potassium: 3.2 mmol/L — ABNORMAL LOW (ref 3.5–5.1)
Sodium: 142 mmol/L (ref 135–145)
Total Bilirubin: 1 mg/dL (ref 0.3–1.2)
Total Protein: 7.1 g/dL (ref 6.5–8.1)

## 2022-09-26 LAB — CBC
HCT: 37.4 % (ref 36.0–46.0)
Hemoglobin: 12.5 g/dL (ref 12.0–15.0)
MCH: 30.6 pg (ref 26.0–34.0)
MCHC: 33.4 g/dL (ref 30.0–36.0)
MCV: 91.4 fL (ref 80.0–100.0)
Platelets: 261 10*3/uL (ref 150–400)
RBC: 4.09 MIL/uL (ref 3.87–5.11)
RDW: 13.4 % (ref 11.5–15.5)
WBC: 6.5 10*3/uL (ref 4.0–10.5)
nRBC: 0 % (ref 0.0–0.2)

## 2022-09-26 LAB — SURGICAL PCR SCREEN
MRSA, PCR: NEGATIVE
Staphylococcus aureus: NEGATIVE

## 2022-09-26 NOTE — Progress Notes (Signed)
PCP - Harrison Mons Cardiologist - Peter Martinique  PPM/ICD - denies   Chest x-ray - n/a EKG - 09/03/22 Stress Test - denies ECHO - 08/16/18 Cardiac Cath - 07/27/20  Sleep Study - denies   ERAS Protcol -yes PRE-SURGERY Ensure or G2- ensure ordered and given  COVID TEST- not needed   Anesthesia review: cardiac clearance note 09/03/22  Patient denies shortness of breath, fever, cough and chest pain at PAT appointment   All instructions explained to the patient, with a verbal understanding of the material. Patient agrees to go over the instructions while at home for a better understanding. Patient also instructed to self quarantine after being tested for COVID-19. The opportunity to ask questions was provided.

## 2022-09-27 NOTE — Progress Notes (Signed)
Anesthesia Chart Review:  Follow with cardiology for hx of HTN, HLD, Afib (post CABG, resolved), CAD s/p CABG x 4 08/05/2020. Seen by Dr. Martinique 09/03/22 for preop eval. Per note, "Coronary artery disease-  Status post CABG x4 by Dr. [name] on 08/15/2020.   Continue aspirin. No beta blocker due to history of bradycardia. Heart healthy low-sodium diet. She is cleared for TKR from a cardiac standpoint. No SBE prophylaxis required."  Preop labs reviewed, mild hypokalemia potassium 3.2, otherwise unremarkable.   EKG 09/03/22: NSR with PAC. Rate 90.   Carotid duplex 08/08/22: Summary:  Right Carotid: Velocities in the right ICA are consistent with a 1-39% stenosis.  Left Carotid: Velocities in the left ICA are consistent with a 40-59% stenosis.  Vertebrals: Bilateral vertebral arteries demonstrate antegrade flow.  Subclavians: Normal flow hemodynamics were seen in bilateral subclavian arteries.   TEE (intraoperative CABG) 08/15/20: POST-OP IMPRESSIONS  - Left Ventricle: The left ventricle is unchanged from pre-bypass. has  normal  systolic function, with an ejection fraction of 65%. The cavity size was  normal.  The wall motion is normal.  - Right Ventricle: The right ventricle appears unchanged from pre-bypass.  - Aorta: The aorta appears unchanged from pre-bypass.  - Left Atrium: The left atrium appears unchanged from pre-bypass.  - Left Atrial Appendage: The left atrial appendage appears unchanged from  pre-bypass.  - Aortic Valve: The aortic valve appears unchanged from pre-bypass.  - Mitral Valve: The mitral valve appears unchanged from pre-bypass.  - Tricuspid Valve: There is Trivial regurgitation.  - Interatrial Septum: The interatrial septum appears unchanged from  pre-bypass.  - Interventricular Septum: The interventricular septum appears unchanged  from  pre-bypass.  - Pericardium: The pericardium appears unchanged from pre-bypass.     Debbie Cardenas [facility] Short Stay  Center/Anesthesiology Phone [phone] 09/27/2022 4:24 PM

## 2022-09-27 NOTE — Anesthesia Preprocedure Evaluation (Addendum)
Anesthesia Evaluation  Patient identified by MRN, date of birth, ID band Patient awake    Reviewed: Allergy & Precautions, NPO status , Patient's Chart, lab work & pertinent test results  Airway Mallampati: II  TM Distance: >3 FB Neck ROM: Full    Dental  (+) Upper Dentures, Partial Upper   Pulmonary former smoker   Pulmonary exam normal breath sounds clear to auscultation       Cardiovascular hypertension, Pt. on medications + CAD and + CABG  + dysrhythmias Atrial Fibrillation  Rhythm:Regular Rate:Normal     Neuro/Psych  PSYCHIATRIC DISORDERS Anxiety     negative neurological ROS     GI/Hepatic negative GI ROS, Neg liver ROS,,,  Endo/Other  negative endocrine ROS    Renal/GU negative Renal ROS     Musculoskeletal  (+) Arthritis , Osteoarthritis,    Abdominal   Peds  Hematology negative hematology ROS (+) Plt 261k    Anesthesia Other Findings Day of surgery medications reviewed with the patient.  Reproductive/Obstetrics                             Anesthesia Physical Anesthesia Plan  ASA: 3  Anesthesia Plan: Spinal   Post-op Pain Management: Tylenol PO (pre-op)* and Regional block*   Induction: Intravenous  PONV Risk Score and Plan: 2 and TIVA and Treatment may vary due to age or medical condition  Airway Management Planned: Natural Airway and Simple Face Mask  Additional Equipment:   Intra-op Plan:   Post-operative Plan:   Informed Consent: I have reviewed the patients History and Physical, chart, labs and discussed the procedure including the risks, benefits and alternatives for the proposed anesthesia with the patient or authorized representative who has indicated his/her understanding and acceptance.     Dental advisory given  Plan Discussed with: CRNA, Anesthesiologist and Surgeon  Anesthesia Plan Comments: (PAT note by Karoline Caldwell, PA-C: Follow with cardiology  for hx of HTN, HLD, Afib (post CABG, resolved), CAD s/p CABG x 4 08/05/2020. Seen by Dr. Martinique 09/03/22 for preop eval. Per note, "Coronary artery disease-Status post CABG x4 by Dr. Ross Marcus 08/15/2020. Continueaspirin. No beta blocker due tohistory ofbradycardia. Heart healthy low-sodium diet. She is cleared for TKR from a cardiac standpoint. No SBE prophylaxis required."  Preop labs reviewed, mild hypokalemia potassium 3.2, otherwise unremarkable.   EKG 09/03/22: NSR with PAC. Rate 90.   Carotid duplex 08/08/22: Summary:  Right Carotid: Velocities in the right ICA are consistent with a 1-39% stenosis.  Left Carotid: Velocities in the left ICA are consistent with a 40-59% stenosis.  Vertebrals:Bilateral vertebral arteries demonstrate antegrade flow.  Subclavians: Normal flow hemodynamics were seen in bilateral subclavian arteries.   TEE (intraoperative CABG) 08/15/20: POST-OP IMPRESSIONS  - Left Ventricle: The left ventricle is unchanged from pre-bypass. has  normal  systolic function, with an ejection fraction of 65%. The cavity size was  normal.  The wall motion is normal.  - Right Ventricle: The right ventricle appears unchanged from pre-bypass.  - Aorta: The aorta appears unchanged from pre-bypass.  - Left Atrium: The left atrium appears unchanged from pre-bypass.  - Left Atrial Appendage: The left atrial appendage appears unchanged from  pre-bypass.  - Aortic Valve: The aortic valve appears unchanged from pre-bypass.  - Mitral Valve: The mitral valve appears unchanged from pre-bypass.  - Tricuspid Valve: There is Trivial regurgitation.  - Interatrial Septum: The interatrial septum appears unchanged from  pre-bypass.  -  Interventricular Septum: The interventricular septum appears unchanged  from  pre-bypass.  - Pericardium: The pericardium appears unchanged from pre-bypass.    )        Anesthesia Quick Evaluation

## 2022-10-01 NOTE — H&P (Signed)
TOTAL KNEE ADMISSION H&P  Patient is being admitted for right total knee arthroplasty.  Subjective:  Chief Complaint:right knee pain.  HPI: Debbie Cardenas, 77 y.o. female, has a history of pain and functional disability in the right knee due to arthritis and has failed non-surgical conservative treatments for greater than 12 weeks to includeNSAID's and/or analgesics, corticosteriod injections, viscosupplementation injections, flexibility and strengthening excercises, use of assistive devices, and activity modification.  Onset of symptoms was gradual, starting 3 years ago with gradually worsening course since that time. The patient noted no past surgery on the right knee(s).  Patient currently rates pain in the right knee(s) at 10 out of 10 with activity. Patient has night pain, worsening of pain with activity and weight bearing, pain that interferes with activities of daily living, pain with passive range of motion, crepitus, and joint swelling.  Patient has evidence of subchondral sclerosis, periarticular osteophytes, and joint space narrowing by imaging studies. There is no active infection.  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   Coronary artery disease 08/15/2020   Coronary artery 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 ([place]) 07/27/2020   HTN (hypertension) 07/27/2020   Hyperlipidemia 07/27/2020   Unilateral primary osteoarthritis, right knee 12/30/2019   Unilateral primary osteoarthritis, left knee 03/26/2019   Status post total replacement of left hip 08/28/2018   Hip fracture ([place]) 08/15/2018   Closed fracture of neck of left femur ([place]) 08/15/2018   Past Medical History:  Diagnosis Date   Anxiety    Arthritis    knees   Closed fracture of neck of left femur ([place]) 08/15/2018   Coronary artery disease    Hyperlipidemia    Hypertension    Pneumonia     Past Surgical History:  Procedure Laterality  Date   APPENDECTOMY     CARDIAC CATHETERIZATION     CATARACT EXTRACTION, BILATERAL     CHOLECYSTECTOMY     CORONARY ARTERY BYPASS GRAFT N/A 08/15/2020   Procedure: CORONARY ARTERY BYPASS GRAFTING (CABG)X4. USING BILATERAL MAMMARY ARTERIES AND RIGHT GREATER SAPHENOUS VEIN HARVESTED ENDOSCOPICALLY.;  Surgeon: Gaye Pollack, MD;  Location: [facility];  Service: Open Heart Surgery;  Laterality: N/A;   EYE SURGERY     bilateral cataract removal   KNEE SURGERY     LEFT HEART CATH AND CORONARY ANGIOGRAPHY N/A 07/27/2020   Procedure: LEFT HEART CATH AND CORONARY ANGIOGRAPHY;  Surgeon: Martinique, Peter M, MD;  Location: [facility];  Service: Cardiovascular;  Laterality: N/A;   TEE WITHOUT CARDIOVERSION N/A 08/15/2020   Procedure: TRANSESOPHAGEAL ECHOCARDIOGRAM (TEE);  Surgeon: Gaye Pollack, MD;  Location: [facility];  Service: Open Heart Surgery;  Laterality: N/A;   TOTAL HIP ARTHROPLASTY Left 08/15/2018   Procedure: TOTAL HIP ARTHROPLASTY ANTERIOR APPROACH;  Surgeon: Mcarthur Rossetti, MD;  Location: [facility];  Service: Orthopedics;  Laterality: Left;    Current Facility-Administered Medications  Medication Dose Route Frequency Provider Last Rate Last Admin   sodium chloride flush (NS) 0.9 % injection 3 mL  3 mL Intravenous Q12H Martinique, Peter M, MD       Current Outpatient Medications  Medication Sig Dispense Refill Last Dose   acetaminophen (TYLENOL) 650 MG CR tablet Take 1,300 mg by mouth every 8 (eight) hours as needed for pain.      acetaminophen-codeine (TYLENOL #3) 300-30 MG tablet Take 1-2 tablets by mouth every 8 (eight) hours as needed for moderate  pain. 30 tablet 0    amLODipine (NORVASC) 5 MG tablet TAKE 1 TABLET (5 MG TOTAL) BY MOUTH DAILY. 90 tablet 3    aspirin EC 81 MG tablet Take 1 tablet (81 mg total) by mouth daily. Swallow whole. 30 tablet 11    atorvastatin (LIPITOR) 10 MG tablet TAKE 1 TABLET BY MOUTH EVERY DAY 90 tablet 1    Cholecalciferol (VITAMIN D) 50 MCG (2000 UT)  tablet Take 2,000 Units by mouth daily.      hydrochlorothiazide (MICROZIDE) 12.5 MG capsule TAKE 1 CAPSULE BY MOUTH EVERY DAY 90 capsule 3    MAGNESIUM OXIDE PO Take 500 mg by mouth daily.      SODIUM FLUORIDE 5000 PPM 1.1 % PSTE Place 1 Application onto teeth at bedtime.      Allergies  Allergen Reactions   Tramadol Itching    Pt itches the day after taking but can tolerate it   Ephedrine     "My dentist told me to never let anyone give me this"   Ace Inhibitors Cough    Social History   Tobacco Use   Smoking status: Former    Types: Cigarettes    Quit date: 06/13/2014    Years since quitting: 8.3   Smokeless tobacco: Never  Substance Use Topics   Alcohol use: Yes    Comment: Occasionally    Family History  Problem Relation Age of Onset   Cancer Sister    Heart disease Sister    Heart attack Brother    Heart disease Brother        s/p CABG   Heart attack Father    Heart disease Sister    Heart attack Brother    Heart disease Brother      Review of Systems  Objective:  Physical Exam Vitals reviewed.  Constitutional:      Appearance: Normal appearance. She is normal weight.  HENT:     Head: Normocephalic.  Eyes:     Extraocular Movements: Extraocular movements intact.     Pupils: Pupils are equal, round, and reactive to light.  Cardiovascular:     Rate and Rhythm: Normal rate.  Pulmonary:     Effort: Pulmonary effort is normal.     Breath sounds: Normal breath sounds.  Abdominal:     Palpations: Abdomen is soft.  Musculoskeletal:     Cervical back: Normal range of motion and neck supple.     Right knee: Effusion, bony tenderness and crepitus present. Decreased range of motion. Tenderness present over the medial joint line and lateral joint line. Abnormal alignment.  Neurological:     Mental Status: She is alert and oriented to person, place, and time.  Psychiatric:        Behavior: Behavior normal.     Vital signs in last 24 hours:     Labs:   Estimated body mass index is 25.96 kg/m as calculated from the following:   Height as of 09/26/22: '5\' 5"'$  (1.651 m).   Weight as of 09/26/22: 70.8 kg.   Imaging Review Plain radiographs demonstrate severe degenerative joint disease of the right knee(s). The overall alignment ismild varus. The bone quality appears to be good for age and reported activity level.      Assessment/Plan:  End stage arthritis, right knee   The patient history, physical examination, clinical judgment of the provider and imaging studies are consistent with end stage degenerative joint disease of the right knee(s) and total knee arthroplasty is deemed medically  necessary. The treatment options including medical management, injection therapy arthroscopy and arthroplasty were discussed at length. The risks and benefits of total knee arthroplasty were presented and reviewed. The risks due to aseptic loosening, infection, stiffness, patella tracking problems, thromboembolic complications and other imponderables were discussed. The patient acknowledged the explanation, agreed to proceed with the plan and consent was signed. Patient is being admitted for inpatient treatment for surgery, pain control, PT, OT, prophylactic antibiotics, VTE prophylaxis, progressive ambulation and ADL's and discharge planning. The patient is planning to be discharged home with home health services

## 2022-10-02 ENCOUNTER — Observation Stay (HOSPITAL_COMMUNITY)
Admission: RE | Admit: 2022-10-02 | Discharge: 2022-10-03 | Disposition: A | Discharge status: Home Health | Payer: HMO | Attending: Orthopaedic Surgery | Admitting: Orthopaedic Surgery

## 2022-10-02 ENCOUNTER — Observation Stay (HOSPITAL_COMMUNITY): Payer: HMO

## 2022-10-02 ENCOUNTER — Other Ambulatory Visit: Payer: Self-pay

## 2022-10-02 ENCOUNTER — Ambulatory Visit (HOSPITAL_COMMUNITY): Payer: HMO | Admitting: Physician Assistant

## 2022-10-02 ENCOUNTER — Encounter (HOSPITAL_COMMUNITY)
Admission: RE | Disposition: A | Discharge status: Home Health | Payer: Self-pay | Source: Home / Self Care | Attending: Orthopaedic Surgery

## 2022-10-02 ENCOUNTER — Ambulatory Visit (HOSPITAL_BASED_OUTPATIENT_CLINIC_OR_DEPARTMENT_OTHER): Payer: HMO | Admitting: Anesthesiology

## 2022-10-02 ENCOUNTER — Encounter (HOSPITAL_COMMUNITY): Payer: Self-pay | Admitting: Orthopaedic Surgery

## 2022-10-02 DIAGNOSIS — I2510 Atherosclerotic heart disease of native coronary artery without angina pectoris: Secondary | ICD-10-CM

## 2022-10-02 DIAGNOSIS — Z96642 Presence of left artificial hip joint: Secondary | ICD-10-CM | POA: Insufficient documentation

## 2022-10-02 DIAGNOSIS — I10 Essential (primary) hypertension: Secondary | ICD-10-CM

## 2022-10-02 DIAGNOSIS — Z7982 Long term (current) use of aspirin: Secondary | ICD-10-CM | POA: Diagnosis not present

## 2022-10-02 DIAGNOSIS — M1711 Unilateral primary osteoarthritis, right knee: Principal | ICD-10-CM | POA: Insufficient documentation

## 2022-10-02 DIAGNOSIS — Z87891 Personal history of nicotine dependence: Secondary | ICD-10-CM

## 2022-10-02 DIAGNOSIS — Z79899 Other long term (current) drug therapy: Secondary | ICD-10-CM | POA: Diagnosis not present

## 2022-10-02 DIAGNOSIS — Z96651 Presence of right artificial knee joint: Secondary | ICD-10-CM

## 2022-10-02 DIAGNOSIS — F419 Anxiety disorder, unspecified: Secondary | ICD-10-CM

## 2022-10-02 HISTORY — PX: TOTAL KNEE ARTHROPLASTY: SHX125

## 2022-10-02 SURGERY — ARTHROPLASTY, KNEE, TOTAL
Anesthesia: Spinal | Site: Knee | Laterality: Right

## 2022-10-02 MED ORDER — CEFAZOLIN SODIUM-DEXTROSE 2-4 GM/100ML-% IV SOLN
2.0000 g | INTRAVENOUS | Status: AC
  Administered 2022-10-02: 2 g via INTRAVENOUS
  Filled 2022-10-02: qty 100

## 2022-10-02 MED ORDER — ACETAMINOPHEN 500 MG PO TABS
1000.0000 mg | ORAL_TABLET | Freq: Once | ORAL | Status: AC
  Administered 2022-10-02: 1000 mg via ORAL
  Filled 2022-10-02: qty 2

## 2022-10-02 MED ORDER — SODIUM CHLORIDE 0.9 % IR SOLN
Status: DC | PRN
  Administered 2022-10-02: 1000 mL

## 2022-10-02 MED ORDER — ACETAMINOPHEN 325 MG PO TABS: 325.0000 mg | ORAL_TABLET | Freq: Four times a day (QID) | ORAL | Status: DC | PRN

## 2022-10-02 MED ORDER — BUPIVACAINE-EPINEPHRINE 0.25% -1:200000 IJ SOLN
INTRAMUSCULAR | Status: DC | PRN
  Administered 2022-10-02: 30 mL

## 2022-10-02 MED ORDER — BUPIVACAINE IN DEXTROSE 0.75-8.25 % IT SOLN
INTRATHECAL | Status: DC | PRN
  Administered 2022-10-02: 1.6 mL via INTRATHECAL

## 2022-10-02 MED ORDER — SODIUM CHLORIDE 0.9 % IV SOLN: INTRAVENOUS | Status: DC

## 2022-10-02 MED ORDER — MENTHOL 3 MG MT LOZG
1.0000 | LOZENGE | OROMUCOSAL | Status: DC | PRN
  Administered 2022-10-02: 3 mg via ORAL
  Filled 2022-10-02: qty 9

## 2022-10-02 MED ORDER — LACTATED RINGERS IV SOLN: INTRAVENOUS | Status: DC

## 2022-10-02 MED ORDER — MIDAZOLAM HCL 2 MG/2ML IJ SOLN
INTRAMUSCULAR | Status: AC
  Filled 2022-10-02: qty 2

## 2022-10-02 MED ORDER — METHOCARBAMOL 500 MG PO TABS
500.0000 mg | ORAL_TABLET | Freq: Four times a day (QID) | ORAL | Status: DC | PRN
  Administered 2022-10-02 – 2022-10-03 (×2): 500 mg via ORAL
  Filled 2022-10-02: qty 1

## 2022-10-02 MED ORDER — ONDANSETRON HCL 4 MG/2ML IJ SOLN: 4.0000 mg | Freq: Four times a day (QID) | INTRAMUSCULAR | Status: DC | PRN

## 2022-10-02 MED ORDER — ONDANSETRON HCL 4 MG/2ML IJ SOLN
INTRAMUSCULAR | Status: AC
  Filled 2022-10-02: qty 2

## 2022-10-02 MED ORDER — FENTANYL CITRATE (PF) 100 MCG/2ML IJ SOLN
INTRAMUSCULAR | Status: AC
  Administered 2022-10-02: 50 ug via INTRAVENOUS
  Filled 2022-10-02: qty 2

## 2022-10-02 MED ORDER — CHLORHEXIDINE GLUCONATE 0.12 % MT SOLN
15.0000 mL | Freq: Once | OROMUCOSAL | Status: AC
  Administered 2022-10-02: 15 mL via OROMUCOSAL
  Filled 2022-10-02: qty 15

## 2022-10-02 MED ORDER — OXYCODONE HCL 5 MG PO TABS
10.0000 mg | ORAL_TABLET | ORAL | Status: DC | PRN
  Filled 2022-10-02: qty 2

## 2022-10-02 MED ORDER — PHENYLEPHRINE HCL-NACL 20-0.9 MG/250ML-% IV SOLN
INTRAVENOUS | Status: AC
  Filled 2022-10-02: qty 250

## 2022-10-02 MED ORDER — FENTANYL CITRATE (PF) 100 MCG/2ML IJ SOLN
25.0000 ug | INTRAMUSCULAR | Status: DC | PRN
  Administered 2022-10-02: 50 ug via INTRAVENOUS

## 2022-10-02 MED ORDER — TRANEXAMIC ACID-NACL 1000-0.7 MG/100ML-% IV SOLN
1000.0000 mg | INTRAVENOUS | Status: AC
  Administered 2022-10-02: 1000 mg via INTRAVENOUS
  Filled 2022-10-02: qty 100

## 2022-10-02 MED ORDER — PHENOL 1.4 % MT LIQD: 1.0000 | OROMUCOSAL | Status: DC | PRN

## 2022-10-02 MED ORDER — PHENYLEPHRINE 80 MCG/ML (10ML) SYRINGE FOR IV PUSH (FOR BLOOD PRESSURE SUPPORT)
PREFILLED_SYRINGE | INTRAVENOUS | Status: AC
  Filled 2022-10-02: qty 10

## 2022-10-02 MED ORDER — ORAL CARE MOUTH RINSE: 15.0000 mL | Freq: Once | OROMUCOSAL | Status: AC

## 2022-10-02 MED ORDER — ONDANSETRON HCL 4 MG/2ML IJ SOLN
INTRAMUSCULAR | Status: DC | PRN
  Administered 2022-10-02: 4 mg via INTRAVENOUS

## 2022-10-02 MED ORDER — PROPOFOL 1000 MG/100ML IV EMUL
INTRAVENOUS | Status: AC
  Filled 2022-10-02: qty 200

## 2022-10-02 MED ORDER — 0.9 % SODIUM CHLORIDE (POUR BTL) OPTIME
TOPICAL | Status: DC | PRN
  Administered 2022-10-02: 1000 mL

## 2022-10-02 MED ORDER — ONDANSETRON HCL 4 MG PO TABS: 4.0000 mg | ORAL_TABLET | Freq: Four times a day (QID) | ORAL | Status: DC | PRN

## 2022-10-02 MED ORDER — METHOCARBAMOL 500 MG PO TABS
ORAL_TABLET | ORAL | Status: AC
  Filled 2022-10-02: qty 1

## 2022-10-02 MED ORDER — METOCLOPRAMIDE HCL 5 MG/ML IJ SOLN: 5.0000 mg | Freq: Three times a day (TID) | INTRAMUSCULAR | Status: DC | PRN

## 2022-10-02 MED ORDER — DEXAMETHASONE SODIUM PHOSPHATE 10 MG/ML IJ SOLN
INTRAMUSCULAR | Status: AC
  Filled 2022-10-02: qty 1

## 2022-10-02 MED ORDER — PHENYLEPHRINE HCL (PRESSORS) 10 MG/ML IV SOLN
INTRAVENOUS | Status: DC | PRN
  Administered 2022-10-02 (×3): 80 ug via INTRAVENOUS

## 2022-10-02 MED ORDER — HYDROMORPHONE HCL 1 MG/ML IJ SOLN
0.5000 mg | INTRAMUSCULAR | Status: DC | PRN
  Administered 2022-10-02: 1 mg via INTRAVENOUS
  Filled 2022-10-02 (×2): qty 1

## 2022-10-02 MED ORDER — METHOCARBAMOL 1000 MG/10ML IJ SOLN: 500.0000 mg | Freq: Four times a day (QID) | INTRAVENOUS | Status: DC | PRN

## 2022-10-02 MED ORDER — OXYCODONE HCL 5 MG PO TABS
ORAL_TABLET | ORAL | Status: AC
  Filled 2022-10-02: qty 1

## 2022-10-02 MED ORDER — POVIDONE-IODINE 10 % EX SWAB
2.0000 | Freq: Once | CUTANEOUS | Status: AC
  Administered 2022-10-02: 2 via TOPICAL

## 2022-10-02 MED ORDER — ONDANSETRON HCL 4 MG/2ML IJ SOLN: 4.0000 mg | Freq: Once | INTRAMUSCULAR | Status: DC | PRN

## 2022-10-02 MED ORDER — PROPOFOL 500 MG/50ML IV EMUL: INTRAVENOUS | Status: DC | PRN

## 2022-10-02 MED ORDER — CEFAZOLIN SODIUM-DEXTROSE 1-4 GM/50ML-% IV SOLN
1.0000 g | Freq: Four times a day (QID) | INTRAVENOUS | Status: AC
  Administered 2022-10-02 – 2022-10-03 (×2): 1 g via INTRAVENOUS
  Filled 2022-10-02 (×2): qty 50

## 2022-10-02 MED ORDER — ROPIVACAINE HCL 5 MG/ML IJ SOLN
INTRAMUSCULAR | Status: DC | PRN
  Administered 2022-10-02: 20 mL via PERINEURAL

## 2022-10-02 MED ORDER — EPHEDRINE 5 MG/ML INJ
INTRAVENOUS | Status: AC
  Filled 2022-10-02: qty 5

## 2022-10-02 MED ORDER — OXYCODONE HCL 5 MG PO TABS
5.0000 mg | ORAL_TABLET | ORAL | Status: DC | PRN
  Administered 2022-10-02: 5 mg via ORAL

## 2022-10-02 MED ORDER — VITAMIN D 25 MCG (1000 UNIT) PO TABS
2000.0000 [IU] | ORAL_TABLET | Freq: Every day | ORAL | Status: DC
  Administered 2022-10-03: 2000 [IU] via ORAL
  Filled 2022-10-02: qty 2

## 2022-10-02 MED ORDER — AMLODIPINE BESYLATE 5 MG PO TABS
5.0000 mg | ORAL_TABLET | Freq: Every day | ORAL | Status: DC
  Administered 2022-10-03: 5 mg via ORAL
  Filled 2022-10-02: qty 1

## 2022-10-02 MED ORDER — METOCLOPRAMIDE HCL 5 MG PO TABS: 5.0000 mg | ORAL_TABLET | Freq: Three times a day (TID) | ORAL | Status: DC | PRN

## 2022-10-02 MED ORDER — HYDROCHLOROTHIAZIDE 12.5 MG PO TABS
12.5000 mg | ORAL_TABLET | Freq: Every day | ORAL | Status: DC
  Administered 2022-10-02 – 2022-10-03 (×2): 12.5 mg via ORAL
  Filled 2022-10-02 (×2): qty 1

## 2022-10-02 MED ORDER — DEXAMETHASONE SODIUM PHOSPHATE 10 MG/ML IJ SOLN
INTRAMUSCULAR | Status: DC | PRN
  Administered 2022-10-02: 4 mg via INTRAVENOUS

## 2022-10-02 MED ORDER — PANTOPRAZOLE SODIUM 40 MG PO TBEC
40.0000 mg | DELAYED_RELEASE_TABLET | Freq: Every day | ORAL | Status: DC
  Administered 2022-10-02 – 2022-10-03 (×2): 40 mg via ORAL
  Filled 2022-10-02 (×2): qty 1

## 2022-10-02 MED ORDER — FENTANYL CITRATE (PF) 100 MCG/2ML IJ SOLN: 50.0000 ug | Freq: Once | INTRAMUSCULAR | Status: AC

## 2022-10-02 MED ORDER — ATORVASTATIN CALCIUM 10 MG PO TABS
10.0000 mg | ORAL_TABLET | Freq: Every day | ORAL | Status: DC
  Administered 2022-10-03: 10 mg via ORAL
  Filled 2022-10-02: qty 1

## 2022-10-02 MED ORDER — LIDOCAINE HCL (CARDIAC) PF 100 MG/5ML IV SOSY
PREFILLED_SYRINGE | INTRAVENOUS | Status: DC | PRN
  Administered 2022-10-02: 20 mg via INTRAVENOUS
  Administered 2022-10-02: 40 mg via INTRAVENOUS

## 2022-10-02 MED ORDER — DEXAMETHASONE SODIUM PHOSPHATE 10 MG/ML IJ SOLN
INTRAMUSCULAR | Status: DC | PRN
  Administered 2022-10-02: 10 mg

## 2022-10-02 MED ORDER — DOCUSATE SODIUM 100 MG PO CAPS
100.0000 mg | ORAL_CAPSULE | Freq: Two times a day (BID) | ORAL | Status: DC
  Administered 2022-10-02 – 2022-10-03 (×2): 100 mg via ORAL
  Filled 2022-10-02 (×2): qty 1

## 2022-10-02 MED ORDER — DIPHENHYDRAMINE HCL 12.5 MG/5ML PO ELIX: 12.5000 mg | ORAL_SOLUTION | ORAL | Status: DC | PRN

## 2022-10-02 MED ORDER — FENTANYL CITRATE (PF) 100 MCG/2ML IJ SOLN
INTRAMUSCULAR | Status: AC
  Filled 2022-10-02: qty 2

## 2022-10-02 MED ORDER — BUPIVACAINE-EPINEPHRINE (PF) 0.25% -1:200000 IJ SOLN
INTRAMUSCULAR | Status: AC
  Filled 2022-10-02: qty 30

## 2022-10-02 MED ORDER — ASPIRIN 81 MG PO CHEW
81.0000 mg | CHEWABLE_TABLET | Freq: Two times a day (BID) | ORAL | Status: DC
  Administered 2022-10-02 – 2022-10-03 (×2): 81 mg via ORAL
  Filled 2022-10-02 (×2): qty 1

## 2022-10-02 MED ORDER — ALUM & MAG HYDROXIDE-SIMETH 200-200-20 MG/5ML PO SUSP: 30.0000 mL | ORAL | Status: DC | PRN

## 2022-10-02 MED ORDER — PROPOFOL 500 MG/50ML IV EMUL
INTRAVENOUS | Status: DC | PRN
  Administered 2022-10-02: 75 ug/kg/min via INTRAVENOUS

## 2022-10-02 SURGICAL SUPPLY — 80 items
BAG COUNTER SPONGE SURGICOUNT (BAG) ×1 IMPLANT
BAG SPNG CNTER NS LX DISP (BAG) ×1
BANDAGE ESMARK 6X9 LF (GAUZE/BANDAGES/DRESSINGS) ×1 IMPLANT
BLADE SAG 18X100X1.27 (BLADE) ×1 IMPLANT
BNDG CMPR 9X6 STRL LF SNTH (GAUZE/BANDAGES/DRESSINGS) ×1
BNDG CMPR MED 10X6 ELC LF (GAUZE/BANDAGES/DRESSINGS) ×1
BNDG ELASTIC 6X10 VLCR STRL LF (GAUZE/BANDAGES/DRESSINGS) IMPLANT
BNDG ELASTIC 6X5.8 VLCR STR LF (GAUZE/BANDAGES/DRESSINGS) ×2 IMPLANT
BNDG ESMARK 6X9 LF (GAUZE/BANDAGES/DRESSINGS) ×1
BOWL SMART MIX CTS (DISPOSABLE) IMPLANT
CATH FOLEY LATEX FREE 14FR (CATHETERS) ×1
CATH FOLEY LF 14FR (CATHETERS) IMPLANT
CEMENT BONE R 1X40 (Cement) IMPLANT
COMP FEM CMT PERSONA SZ9 RT (Joint) ×1 IMPLANT
COMP PATELLA 32 STD 8.5 THK (Orthopedic Implant) IMPLANT
COMP TIB PS KNEE E 0D RT (Joint) ×1 IMPLANT
COMPONENT FEM CMT PRSONA SZ9RT (Joint) IMPLANT
COMPONET TIB PS KNEE E 0D RT (Joint) IMPLANT
COOLER ICEMAN CLASSIC (MISCELLANEOUS) ×1 IMPLANT
COVER SURGICAL LIGHT HANDLE (MISCELLANEOUS) ×1 IMPLANT
CUFF TOURN SGL QUICK 34 (TOURNIQUET CUFF) ×1
CUFF TOURN SGL QUICK 42 (TOURNIQUET CUFF) IMPLANT
CUFF TRNQT CYL 34X4.125X (TOURNIQUET CUFF) ×1 IMPLANT
DRAPE EXTREMITY T 121X128X90 (DISPOSABLE) ×1 IMPLANT
DRAPE HALF SHEET 40X57 (DRAPES) ×1 IMPLANT
DRAPE U-SHAPE 47X51 STRL (DRAPES) ×1 IMPLANT
DRSG XEROFORM 1X8 (GAUZE/BANDAGES/DRESSINGS) IMPLANT
DURAPREP 26ML APPLICATOR (WOUND CARE) ×1 IMPLANT
ELECT CAUTERY BLADE 6.4 (BLADE) ×1 IMPLANT
ELECT REM PT RETURN 9FT ADLT (ELECTROSURGICAL) ×1
ELECTRODE REM PT RTRN 9FT ADLT (ELECTROSURGICAL) ×1 IMPLANT
FACESHIELD WRAPAROUND (MASK) ×2 IMPLANT
FACESHIELD WRAPAROUND OR TEAM (MASK) ×2 IMPLANT
GAUZE PAD ABD 8X10 STRL (GAUZE/BANDAGES/DRESSINGS) ×1 IMPLANT
GAUZE SPONGE 4X4 12PLY STRL (GAUZE/BANDAGES/DRESSINGS) ×1 IMPLANT
GAUZE SPONGE 4X4 12PLY STRL LF (GAUZE/BANDAGES/DRESSINGS) IMPLANT
GAUZE XEROFORM 1X8 LF (GAUZE/BANDAGES/DRESSINGS) ×1 IMPLANT
GLOVE BIOGEL PI IND STRL 8 (GLOVE) ×2 IMPLANT
GLOVE ORTHO TXT STRL SZ7.5 (GLOVE) ×1 IMPLANT
GLOVE SURG ORTHO 8.0 STRL STRW (GLOVE) ×1 IMPLANT
GOWN STRL REUS W/ TWL LRG LVL3 (GOWN DISPOSABLE) IMPLANT
GOWN STRL REUS W/ TWL XL LVL3 (GOWN DISPOSABLE) ×2 IMPLANT
GOWN STRL REUS W/TWL LRG LVL3 (GOWN DISPOSABLE)
GOWN STRL REUS W/TWL XL LVL3 (GOWN DISPOSABLE) ×2
HANDPIECE INTERPULSE COAX TIP (DISPOSABLE) ×1
HDLS TROCR DRIL PIN KNEE 75 (PIN) ×1
IMMOBILIZER KNEE 22 UNIV (SOFTGOODS) ×1 IMPLANT
INSERT TIB ARTISURF SZ8-11X12 (Insert) IMPLANT
IV NS 1000ML (IV SOLUTION) ×1
IV NS 1000ML BAXH (IV SOLUTION) ×1 IMPLANT
KIT BASIN OR (CUSTOM PROCEDURE TRAY) ×1 IMPLANT
KIT TURNOVER KIT B (KITS) ×1 IMPLANT
MANIFOLD NEPTUNE II (INSTRUMENTS) ×1 IMPLANT
NDL 18GX1X1/2 (RX/OR ONLY) (NEEDLE) IMPLANT
NEEDLE 18GX1X1/2 (RX/OR ONLY) (NEEDLE) IMPLANT
NS IRRIG 1000ML POUR BTL (IV SOLUTION) ×1 IMPLANT
PACK TOTAL JOINT (CUSTOM PROCEDURE TRAY) ×1 IMPLANT
PAD ARMBOARD 7.5X6 YLW CONV (MISCELLANEOUS) ×1 IMPLANT
PAD COLD SHLDR WRAP-ON (PAD) ×1 IMPLANT
PADDING CAST COTTON 6X4 STRL (CAST SUPPLIES) ×1 IMPLANT
PATELLA ZIMMER 32MM (Orthopedic Implant) ×1 IMPLANT
PIN DRILL HDLS TROCAR 75 4PK (PIN) IMPLANT
SCREW FEMALE HEX FIX 25X2.5 (ORTHOPEDIC DISPOSABLE SUPPLIES) IMPLANT
SET HNDPC FAN SPRY TIP SCT (DISPOSABLE) ×1 IMPLANT
SET PAD KNEE POSITIONER (MISCELLANEOUS) ×1 IMPLANT
STAPLER VISISTAT 35W (STAPLE) ×1 IMPLANT
SUCTION FRAZIER HANDLE 10FR (MISCELLANEOUS) ×1
SUCTION TUBE FRAZIER 10FR DISP (MISCELLANEOUS) ×1 IMPLANT
SUT VIC AB 0 CT1 27 (SUTURE) ×1
SUT VIC AB 0 CT1 27XBRD ANBCTR (SUTURE) ×1 IMPLANT
SUT VIC AB 1 CT1 27 (SUTURE) ×3
SUT VIC AB 1 CT1 27XBRD ANBCTR (SUTURE) ×2 IMPLANT
SUT VIC AB 2-0 CT1 27 (SUTURE) ×2
SUT VIC AB 2-0 CT1 TAPERPNT 27 (SUTURE) ×2 IMPLANT
SYR 50ML LL SCALE MARK (SYRINGE) IMPLANT
TOWEL GREEN STERILE (TOWEL DISPOSABLE) ×1 IMPLANT
TOWEL GREEN STERILE FF (TOWEL DISPOSABLE) ×1 IMPLANT
TRAY CATH 16FR W/PLASTIC CATH (SET/KITS/TRAYS/PACK) IMPLANT
TRAY FOL W/BAG SLVR 16FR STRL (SET/KITS/TRAYS/PACK) IMPLANT
TRAY FOLEY W/BAG SLVR 16FR LF (SET/KITS/TRAYS/PACK) ×1

## 2022-10-02 NOTE — Op Note (Signed)
Operative Note  Date of operation: 10/02/2022 Preoperative diagnosis: Right knee primary osteoarthritis Postoperative diagnosis: Same  Procedure: Right cemented total knee arthroplasty  Implants: Biomet/Zimmer persona knee system Implant Name Type Inv. Item Serial No. Manufacturer Lot No. LRB No. Used Action  CEMENT BONE R 1X40 - [id number] Cement CEMENT BONE R 1X40  ZIMMER [employer]ON(ORTH,TRAU,BIO,SG) [id number] Right 1 Implanted  CEMENT BONE R 1X40 - [id number] Cement CEMENT BONE R 1X40  [employer]IMMER [employer]ON(ORTH,TRAU,BIO,SG) [id number] Right 1 Implanted  INSERT TIB ARTISURF [id number] - [id number] Insert INSERT TIB ARTISURF [id number]  ZIMMER RECON(ORTH,TRAU,BIO,SG) [id number] Right 1 Implanted  COMP FEM CMT PERSONA SZ9 RT - [id number] Joint COMP FEM CMT PERSONA SZ9 RT  ZIMMER [employer]ON(ORTH,TRAU,BIO,SG) [id number] Right 1 Implanted  Persona 0" Keel Right cemented Tibia   [id number]  [id number] Right 1 Implanted  NexGen Complete knee solution All Poly Patella   [id number]  [id number] Right 1 Implanted   Surgeon: Lind Guest. Ninfa Linden, MD Assistant: Benita Stabile, PA-C  Anesthesia: #1 right lower extremity adductor canal block, #2 spinal, #3 local Antibiotics: IV Ancef Tourniquet time: Just under 1 hour EBL: Less than 50 cc Complications: None  Indications: The patient is a 77 year old female well-known to me.  She has debilitating arthritis that is end-stage of both her knees.  She has tried and failed conservative treatment for many years now.  At this point her knee pain is daily and it is detrimentally affecting her mobility, her quality of life, and her actives day living to the point she wishes to proceed with a knee replacement and we agree with this as well.  She would like to have her right knee replaced today and then at some point in the future proceed with a left knee replacement given the severity of arthritis of both her knees.  She understands there is risk of acute blood loss anemia, nerve or  vessel injury, fracture, infection, DVT, implant failure and wound healing issues.  She understands her goals are hopefully decrease pain, improve mobility, and improve quality of life.  Procedure description: After informed consent was obtained and the appropriate right knee was marked, anesthesia obtained a right adductor canal block in the holding room and the patient was brought to the operating room and set up on the stretcher where spinal anesthesia was obtained.  She was then laid in supine position on the stretcher and a Foley catheter was placed.  A nonsterile tourniquet is placed around her upper right thigh and her right thigh, knee, leg, ankle and foot were prepped and draped with DuraPrep and sterile drapes.  A timeout was called and she was identified as the correct patient the correct right knee.  An Esmarch was then used to wrap out the leg and the tourniquet was inflated to 300 mm of pressure.  I then made a direct midline incision over the telling carried this proximally distally.  Dissection was carried down to the knee joint and a medial parapatellar arthrotomy was made.  With the knee in a flexed position we found osteophytes in all 3 compartments of the knee as well as 1 cartilage in all 3 compartments.  Remnants of the ACL as well as medial lateral meniscus were removed.  We placed our extramedullary cutting guide first for making her proximal tibia cut correction varus and valgus and a 7 degree slope.  We set this cut to take 2 mm off the low side.  We made this cut without difficulty but then backed down the cut 2 more millimeters.  We then used a intramedullary cutting guide for distal femoral cut setting this for right knee at 5 degrees externally rotated and a 10 mm distal femoral cut.  We made this cut without difficulty and brought the knee back down to full extension and she slightly hyperextended with a 10 mm extension block.  We then went back to the femur and put a femoral sizing  guide based off the epicondylar axis.  Based off of this we chose a size 9 femur.  We put a 4-in-1 cutting block for a size 9 femur and made our anterior and posterior cuts followed by her chamfer cuts.  We then went back to the tibia and chose a size E tibial tray for coverage over the tibial plateau setting the rotation off the tibial tubercle and the femur.  We made this cut without difficulty in terms of the keel and the drill hole.  We then trialed our size E right tibia followed by our size 9 right CR standard femur.  We placed a 10 mm medial congruent fixed-bearing right polyethylene insert and went up to 12 mm insert and we are pleased with range of motion and stability with a 12 mm insert.  We then made a patella cut and drilled 3 holes for a size 32 patella button.  With all trial instrumentation and in the knee again we put her through several cycles of motion and we are pleased with range of motion and stability.  We then removed all transportation of the knee and irrigate the knee with normal saline solution using pulsatile lavage.  We dried the knee roll well and then placed our Marcaine with epinephrine around the arthrotomy.  The cement was then mixed with the knee in a flexed position we cemented our Biomet/Zimmer persona tibial tray for right knee size E followed by cementing our size 9 right CR standard femur.  We placed our 12 mm right medial congruent fixed-bearing polythene insert and cemented our size 32 patella button.  We then held the knee fully extended while the cement hardened and we compressed the knee.  Once the cemented hardened the tourniquet was let down.  We removed excess cement debris from the knee and put her through several cycles of motion again.  Hemostasis was obtained with electrocautery.  The arthrotomy was closed with interrupted #1 Vicryl suture followed by 0 Vicryl close the deep tissue and 2-0 Vicryl to close the subcutaneous tissue.  Skin was closed with staples.  Benita Stabile PA-C did assist during the entire case from beginning and his assistance was crucial medically necessary for soft tissue management and retraction, helping guide implant placement and a layered closure of the wound.

## 2022-10-02 NOTE — Anesthesia Procedure Notes (Signed)
Anesthesia Regional Block: Adductor canal block   Pre-Anesthetic Checklist: , timeout performed,  Correct Patient, Correct Site, Correct Laterality,  Correct Procedure, Correct Position, site marked,  Risks and benefits discussed,  Surgical consent,  Pre-op evaluation,  At surgeon's request and post-op pain management  Laterality: Right  Prep: chloraprep       Needles:  Injection technique: Single-shot  Needle Type: Echogenic Needle     Needle Length: 9cm  Needle Gauge: 21     Additional Needles:   Procedures:,,,, ultrasound used (permanent image in chart),,    Narrative:  Start time: 10/02/2022 10:52 AM End time: 10/02/2022 11:00 AM Injection made incrementally with aspirations every 5 mL.  Performed by: Personally  Anesthesiologist: Santa Lighter, MD  Additional Notes: No pain on injection. No increased resistance to injection. Injection made in 5cc increments.  Good needle visualization.  Patient tolerated procedure well.

## 2022-10-02 NOTE — Anesthesia Postprocedure Evaluation (Signed)
Anesthesia Post Note  Patient: Debbie Cardenas  Procedure(s) Performed: RIGHT TOTAL KNEE ARTHROPLASTY (Right: Knee)     Patient location during evaluation: PACU Anesthesia Type: Spinal Level of consciousness: awake, awake and alert and oriented Pain management: pain level controlled Vital Signs Assessment: post-procedure vital signs reviewed and stable Respiratory status: spontaneous breathing, nonlabored ventilation and respiratory function stable Cardiovascular status: blood pressure returned to baseline and stable Postop Assessment: no headache, no backache, spinal receding and no apparent nausea or vomiting Anesthetic complications: no   No notable events documented.  Last Vitals:  Vitals:   10/02/22 1515 10/02/22 1530  BP: 134/72 (!) 146/63  Pulse: 78 79  Resp: 12 18  Temp: 36.6 C   SpO2: 96% 98%    Last Pain:  Vitals:   10/02/22 1515  TempSrc:   PainSc: [place]

## 2022-10-02 NOTE — Evaluation (Signed)
Physical Therapy Evaluation Patient Details Name: Debbie Cardenas MRN: [id number] DOB: August 16, 1945 Today's Date: 10/02/2022  History of Present Illness  77 y.o. female presents to [facility] hospital on 10/02/2022 for elective R TKA. PMH includes CABG x 4, HTN, HLD, OA, anxiety.  Clinical Impression  Pt presents to PT with deficits in functional mobility, gait, balance, strength, power, ROM. Pt is able to transfer and ambulate with use of RW and without physical assistance at this time. PT provides education on the need for extension of RLE when resting, encourages use of knee immobilizer to maintain knee extension when resting. PT also provides education on HEP. Acute PT will follow up tomorrow for further gait and stair training.       Recommendations for follow up therapy are one component of a multi-disciplinary discharge planning process, led by the attending physician.  Recommendations may be updated based on patient status, additional functional criteria and insurance authorization.  Follow Up Recommendations Follow physician's recommendations for discharge plan and follow up therapies      Assistance Recommended at Discharge Intermittent Supervision/Assistance  Patient can return home with the following  A little help with walking and/or transfers;A little help with bathing/dressing/bathroom;Assistance with cooking/housework;Assist for transportation;Help with stairs or ramp for entrance    Equipment Recommendations None recommended by PT (may need to confirm pt has RW and not rollator)  Recommendations for Other Services       Functional Status Assessment Patient has had a recent decline in their functional status and demonstrates the ability to make significant improvements in function in a reasonable and predictable amount of time.     Precautions / Restrictions Precautions Precautions: Knee;Fall Precaution Booklet Issued: Yes (comment) Required Braces or Orthoses: Knee Immobilizer -  Right Restrictions Weight Bearing Restrictions: Yes RLE Weight Bearing: Weight bearing as tolerated      Mobility  Bed Mobility Overal bed mobility: Needs Assistance Bed Mobility: Supine to Sit     Supine to sit: Supervision, HOB elevated          Transfers Overall transfer level: Needs assistance Equipment used: Rolling walker (2 wheels) Transfers: Sit to/from Stand Sit to Stand: Min guard                Ambulation/Gait Ambulation/Gait assistance: Min guard Gait Distance (Feet): 30 Feet Assistive device: Rolling walker (2 wheels) Gait Pattern/deviations: Step-through pattern Gait velocity: reduced Gait velocity interpretation: <1.31 ft/sec, indicative of household ambulator   General Gait Details: slowed step-through gait, reduced stance time on RLE  Stairs            Wheelchair Mobility    Modified Rankin (Stroke Patients Only)       Balance Overall balance assessment: Needs assistance Sitting-balance support: No upper extremity supported, Feet supported Sitting balance-Leahy Scale: Good     Standing balance support: Bilateral upper extremity supported, Reliant on assistive device for balance Standing balance-Leahy Scale: Poor                               Pertinent Vitals/Pain Pain Assessment Pain Assessment: 0-10 Pain Score: 5  Pain Location: R knee Pain Descriptors / Indicators: Aching Pain Intervention(s): Patient requesting pain meds-RN notified    Home Living Family/patient expects to be discharged to:: Private residence Living Arrangements: Spouse/significant other Available Help at Discharge: Family;Available 24 hours/day Type of Home: House Home Access: Stairs to enter Entrance Stairs-Rails: Can reach both Entrance Stairs-Number of Steps: 5  Home Layout: One level Home Equipment: Conservation officer, nature (2 wheels);Cane - single point;BSC/3in1      Prior Function Prior Level of Function : Independent/Modified  Independent;Driving                     Hand Dominance   Dominant Hand: Left    Extremity/Trunk Assessment   Upper Extremity Assessment Upper Extremity Assessment: Overall WFL for tasks assessed    Lower Extremity Assessment Lower Extremity Assessment: RLE deficits/detail RLE Deficits / Details: post-op ROM restrictions as expected post TKA, pt is able to perform SLR without lag, ankle strength WFL    Cervical / Trunk Assessment Cervical / Trunk Assessment: Normal  Communication   Communication: No difficulties  Cognition Arousal/Alertness: Awake/alert Behavior During Therapy: WFL for tasks assessed/performed Overall Cognitive Status: Within Functional Limits for tasks assessed                                          General Comments General comments (skin integrity, edema, etc.): VSS on RA    Exercises     Assessment/Plan    PT Assessment Patient needs continued PT services  PT Problem List Decreased strength;Decreased range of motion;Decreased activity tolerance;Decreased balance;Decreased mobility;Decreased knowledge of use of DME;Pain       PT Treatment Interventions DME instruction;Gait training;Stair training;Functional mobility training;Therapeutic activities;Therapeutic exercise;Balance training;Neuromuscular re-education;Patient/family education    PT Goals (Current goals can be found in the Care Plan section)  Acute Rehab PT Goals Patient Stated Goal: to go home, return to independence PT Goal Formulation: With patient Time For Goal Achievement: 10/06/22 Potential to Achieve Goals: Good    Frequency 7X/week     Co-evaluation               AM-PAC PT "6 Clicks" Mobility  Outcome Measure Help needed turning from your back to your side while in a flat bed without using bedrails?: A Little Help needed moving from lying on your back to sitting on the side of a flat bed without using bedrails?: A Little Help needed moving  to and from a bed to a chair (including a wheelchair)?: A Little Help needed standing up from a chair using your arms (e.g., wheelchair or bedside chair)?: A Little Help needed to walk in hospital room?: A Little Help needed climbing 3-5 steps with a railing? : A Lot 6 Click Score: 17    End of Session Equipment Utilized During Treatment: Right knee immobilizer;Gait belt Activity Tolerance: Patient tolerated treatment well Patient left: in chair;with call bell/phone within reach;with family/visitor present Nurse Communication: Mobility status PT Visit Diagnosis: Other abnormalities of gait and mobility (R26.89);Muscle weakness (generalized) (M62.81);Pain Pain - Right/Left: Right Pain - part of body: Knee    Time: [id number] PT Time Calculation (min) (ACUTE ONLY): 35 min   Charges:   PT Evaluation $PT Eval Low Complexity: 1 Low          Zenaida Niece, PT, DPT Acute Rehabilitation Office [phone]   Zenaida Niece 10/02/2022, 4:35 PM

## 2022-10-02 NOTE — Anesthesia Procedure Notes (Addendum)
Spinal  Patient location during procedure: OR Start time: 10/02/2022 12:24 PM End time: 10/02/2022 12:27 PM Reason for block: surgical anesthesia Staffing Performed: anesthesiologist  Anesthesiologist: Santa Lighter, MD Performed by: Santa Lighter, MD Authorized by: Santa Lighter, MD   Preanesthetic Checklist Completed: patient identified, IV checked, risks and benefits discussed, surgical consent, monitors and equipment checked, pre-op evaluation and timeout performed Spinal Block Patient position: sitting Prep: DuraPrep and site prepped and draped Patient monitoring: continuous pulse ox and blood pressure Approach: midline Location: L3-4 Injection technique: single-shot Needle Needle type: Pencan  Needle gauge: 24 G Assessment Events: CSF return Additional Notes Functioning IV was confirmed and monitors were applied. Sterile prep and drape, including hand hygiene, mask and sterile gloves were used. The patient was positioned and the spine was prepped. The skin was anesthetized with lidocaine.  Free flow of clear CSF was obtained prior to injecting local anesthetic into the CSF.  The spinal needle aspirated freely following injection.  The needle was carefully withdrawn.  The patient tolerated the procedure well. Consent was obtained prior to procedure with all questions answered and concerns addressed. Risks including but not limited to bleeding, infection, nerve damage, paralysis, failed block, inadequate analgesia, allergic reaction, high spinal, itching and headache were discussed and the patient wished to proceed.   Hoy Morn, MD

## 2022-10-02 NOTE — Interval H&P Note (Signed)
History and Physical Interval Note: The patient understands that she is here today for a right knee replacement to treat her severe right knee arthritis.  There has been no acute or interval change in her medical status.  See H&P.  The risks and benefits of surgery have been discussed in detail and informed consent is obtained.  The right operative knee has been marked.  10/02/2022 11:37 AM  Debbie Cardenas  has presented today for surgery, with the diagnosis of OSTEOARTHRITIS / [place].  The various methods of treatment have been discussed with the patient and family. After consideration of risks, benefits and other options for treatment, the patient has consented to  Procedure(s): RIGHT TOTAL KNEE ARTHROPLASTY (Right) as a surgical intervention.  The patient's history has been reviewed, patient examined, no change in status, stable for surgery.  I have reviewed the patient's chart and labs.  Questions were answered to the patient's satisfaction.     Mcarthur Rossetti

## 2022-10-02 NOTE — Transfer of Care (Signed)
Immediate Anesthesia Transfer of Care Note  Patient: Debbie Cardenas  Procedure(s) Performed: RIGHT TOTAL KNEE ARTHROPLASTY (Right: Knee)  Patient Location: PACU  Anesthesia Type:Spinal  Level of Consciousness: awake, alert , and oriented  Airway & Oxygen Therapy: Patient Spontanous Breathing  Post-op Assessment: Report given to RN and Post -op Vital signs reviewed and stable  Post vital signs: Reviewed and stable  Last Vitals:  Vitals Value Taken Time  BP 126/60 10/02/22 1430  Temp    Pulse 87 10/02/22 1433  Resp 24 10/02/22 1433  SpO2 99 % 10/02/22 1433  Vitals shown include unvalidated device data.  Last Pain:  Vitals:   10/02/22 1430  TempSrc:   PainSc: 0-No pain         Complications: No notable events documented.

## 2022-10-03 DIAGNOSIS — M1711 Unilateral primary osteoarthritis, right knee: Secondary | ICD-10-CM | POA: Diagnosis not present

## 2022-10-03 LAB — CBC
HCT: 34.2 % — ABNORMAL LOW (ref 36.0–46.0)
Hemoglobin: 11.9 g/dL — ABNORMAL LOW (ref 12.0–15.0)
MCH: 30.7 pg (ref 26.0–34.0)
MCHC: 34.8 g/dL (ref 30.0–36.0)
MCV: 88.4 fL (ref 80.0–100.0)
Platelets: 257 10*3/uL (ref 150–400)
RBC: 3.87 MIL/uL (ref 3.87–5.11)
RDW: 13.5 % (ref 11.5–15.5)
WBC: 15.6 10*3/uL — ABNORMAL HIGH (ref 4.0–10.5)
nRBC: 0 % (ref 0.0–0.2)

## 2022-10-03 LAB — BASIC METABOLIC PANEL
Anion gap: 10 (ref 5–15)
BUN: 20 mg/dL (ref 8–23)
CO2: 23 mmol/L (ref 22–32)
Calcium: 9.5 mg/dL (ref 8.9–10.3)
Chloride: 108 mmol/L (ref 98–111)
Creatinine, Ser: 0.9 mg/dL (ref 0.44–1.00)
GFR, Estimated: >60 mL/min (ref >60)
Glucose, Bld: 159 mg/dL — ABNORMAL HIGH (ref 70–99)
Potassium: 3.3 mmol/L — ABNORMAL LOW (ref 3.5–5.1)
Sodium: 141 mmol/L (ref 135–145)

## 2022-10-03 MED ORDER — METHOCARBAMOL 500 MG PO TABS: 500.0000 mg | ORAL_TABLET | Freq: Four times a day (QID) | ORAL | 1 refills | Status: DC | PRN

## 2022-10-03 MED ORDER — HYDROCODONE-ACETAMINOPHEN 5-325 MG PO TABS
1.0000 | ORAL_TABLET | ORAL | Status: DC | PRN
  Administered 2022-10-03: 2 via ORAL
  Filled 2022-10-03: qty 2

## 2022-10-03 MED ORDER — HYDROCODONE-ACETAMINOPHEN 5-325 MG PO TABS: 1.0000 | ORAL_TABLET | ORAL | 0 refills | Status: DC | PRN

## 2022-10-03 MED ORDER — ASPIRIN 81 MG PO CHEW: 81.0000 mg | CHEWABLE_TABLET | Freq: Two times a day (BID) | ORAL | 0 refills | Status: DC

## 2022-10-03 NOTE — TOC Transition Note (Signed)
Transition of Care [facility]) - CM/SW Discharge Note   Patient Details  Name: Debbie Cardenas MRN: [id number] Date of Birth: 10-22-1945  [facility] CM/SW Contact:  Sharin Mons, RN Phone Number: 10/03/2022, 11:18 AM   Clinical Narrative:    Patient will DC to: home Anticipated DC date: 10/03/2022 Family notified: yes Transport by: car        - Status post total right knee replacement ,3/5 Per MD patient ready for DC today. RN, patient, patient's husband, and [place] aware of DC plan. Husband to assist with care once discharge. Pt states without DME needs. States already has RW, [facility] @ home. Pt without RX med concerns. Post hospital f/u noted on AVS. Husband to provide transportation to home.  RNCM will sign off for now as intervention is no longer needed. Please consult [place] again if new needs arise.    Final next level of care: [place] Barriers to Discharge: No Barriers Identified   Patient Goals and CMS Choice      Discharge Placement                         Discharge Plan and Services Additional resources added to the After Visit Summary for                                       Social Determinants of Health (SDOH) Interventions SDOH Screenings   Depression (PHQ2-9): Low Risk  (11/15/2020)  Tobacco Use: Medium Risk (10/02/2022)     Readmission Risk Interventions     No data to display

## 2022-10-03 NOTE — Progress Notes (Signed)
Debbie Cardenas to be D/C'd Home with home health per MD order.  Discussed with the patient and all questions fully answered.  IV catheter discontinued intact. Site without signs and symptoms of complications. Dressing and pressure applied.  An After Visit Summary was printed and given to the patient. Patient prescriptions sent to her pharmacy.  D/c education completed with patient/family including follow up instructions, medication list, d/c activities limitations if indicated, with other d/c instructions as indicated by MD - patient able to verbalize understanding, all questions fully answered.   Patient instructed to return to ED, call 911, or call MD for any changes in condition.   Patient escorted via [place], and D/C home via private auto.  Debbie Cardenas 10/03/2022 12:27 PM

## 2022-10-03 NOTE — Discharge Summary (Signed)
Patient ID: Debbie Cardenas MRN: [id number] DOB/AGE: [id number] y.o.  Admit date: 10/02/2022 Discharge date: 10/03/2022  Admission Diagnoses:  Principal Problem:   Unilateral primary osteoarthritis, right knee Active Problems:   Status post total right knee replacement   Discharge Diagnoses:  Same  Past Medical History:  Diagnosis Date   Anxiety    Arthritis    knees   Closed fracture of neck of left femur ([place]) 08/15/2018   Coronary artery disease    Hyperlipidemia    Hypertension    Pneumonia     Surgeries: Procedure(s): RIGHT TOTAL KNEE ARTHROPLASTY on 10/02/2022   Consultants:   Discharged Condition: Improved  Hospital Course: Debbie Cardenas is an 77 y.o. female who was admitted 10/02/2022 for operative treatment ofUnilateral primary osteoarthritis, right knee. Patient has severe unremitting pain that affects sleep, daily activities, and work/hobbies. After pre-op clearance the patient was taken to the operating room on 10/02/2022 and underwent  Procedure(s): RIGHT TOTAL KNEE ARTHROPLASTY.    Patient was given perioperative antibiotics:  Anti-infectives (From admission, onward)    Start     Dose/Rate Route Frequency Ordered Stop   10/02/22 1800  ceFAZolin (ANCEF) IVPB 1 g/50 mL premix        1 g 100 mL/hr over 30 Minutes Intravenous Every 6 hours 10/02/22 1531 10/03/22 0127   10/02/22 1015  ceFAZolin (ANCEF) IVPB 2g/100 mL premix        2 g 200 mL/hr over 30 Minutes Intravenous On call to O.R. 10/02/22 1000 10/02/22 1217        Patient was given sequential compression devices, early ambulation, and chemoprophylaxis to prevent DVT.  Patient benefited maximally from hospital stay and there were no complications.    Recent vital signs: Patient Vitals for the past 24 hrs:  BP Temp Temp src Pulse Resp SpO2 Height Weight  10/03/22 0808 136/65 98.3 F (36.8 C) Oral 81 18 98 % -- --  10/03/22 0436 (!) 151/50 98.1 F (36.7 C) Oral 94 18 97 % -- --  10/02/22 2004  137/64 98.2 F (36.8 C) -- 80 18 97 % -- --  10/02/22 1530 (!) 146/63 -- -- 79 18 98 % -- --  10/02/22 1515 134/72 97.9 F (36.6 C) -- 78 12 96 % -- --  10/02/22 1500 134/78 -- -- 77 11 96 % -- --  10/02/22 1445 132/65 -- -- 82 14 100 % -- --  10/02/22 1430 126/60 97.9 F (36.6 C) -- 80 15 97 % -- --  10/02/22 1055 (!) 156/62 -- -- (!) 59 11 98 % -- --  10/02/22 1050 (!) 166/53 -- -- 62 13 98 % -- --  10/02/22 1045 (!) 152/58 -- -- (!) 59 11 97 % -- --  10/02/22 1010 -- -- -- 62 -- 99 % -- --  10/02/22 1008 (!) 148/57 98 F (36.7 C) Oral 63 18 99 % '5\' 5"'$  (1.651 m) 70.3 kg  10/02/22 1005 (!) 148/57 -- -- 65 -- 98 % -- --     Recent laboratory studies:  Recent Labs    10/03/22 0433  WBC 15.6*  HGB 11.9*  HCT 34.2*  PLT 257  NA 141  K 3.3*  CL 108  CO2 23  BUN 20  CREATININE 0.90  GLUCOSE 159*  CALCIUM 9.5     Discharge Medications:   Allergies as of 10/03/2022       Reactions   Tramadol Itching   Pt itches the day after taking  but can tolerate it   Ephedrine    "My dentist told me to never let anyone give me this"   Ace Inhibitors Cough        Medication List     STOP taking these medications    acetaminophen 650 MG CR tablet Commonly known as: TYLENOL   acetaminophen-codeine 300-30 MG tablet Commonly known as: TYLENOL #3   aspirin EC 81 MG tablet Replaced by: aspirin 81 MG chewable tablet       TAKE these medications    amLODipine 5 MG tablet Commonly known as: NORVASC TAKE 1 TABLET (5 MG TOTAL) BY MOUTH DAILY.   aspirin 81 MG chewable tablet Chew 1 tablet (81 mg total) by mouth 2 (two) times daily. Replaces: aspirin EC 81 MG tablet   atorvastatin 10 MG tablet Commonly known as: LIPITOR TAKE 1 TABLET BY MOUTH EVERY DAY   hydrochlorothiazide 12.5 MG capsule Commonly known as: MICROZIDE TAKE 1 CAPSULE BY MOUTH EVERY DAY   HYDROcodone-acetaminophen 5-325 MG tablet Commonly known as: NORCO/VICODIN Take 1-2 tablets by mouth every 4 (four)  hours as needed for moderate pain.   MAGNESIUM OXIDE PO Take 500 mg by mouth daily.   methocarbamol 500 MG tablet Commonly known as: ROBAXIN Take 1 tablet (500 mg total) by mouth every 6 (six) hours as needed for muscle spasms.   Sodium Fluoride 5000 PPM 1.1 % Pste Generic drug: Sodium Fluoride Place 1 Application onto teeth at bedtime.   Vitamin D 50 MCG (2000 UT) tablet Take 2,000 Units by mouth daily.               Durable Medical Equipment  (From admission, onward)           Start     Ordered   10/02/22 1532  DME 3 n 1  Once        10/02/22 1531   10/02/22 1532  DME Walker rolling  Once       Question Answer Comment  Walker: With 5 Inch Wheels   Patient needs a walker to treat with the following condition Status post total right knee replacement      10/02/22 1531            Diagnostic Studies: DG Knee Right Port  Result Date: 10/02/2022 CLINICAL DATA:  Status post right hip replacement. EXAM: PORTABLE RIGHT KNEE - 1-2 VIEW COMPARISON:  Preoperative radiograph 08/08/2022 FINDINGS: Right knee arthroplasty in expected alignment. No periprosthetic lucency or fracture. There has been patellar resurfacing. Recent postsurgical change includes air and edema in the soft tissues and joint space. Anterior skin staples in place. IMPRESSION: Right knee arthroplasty without immediate postoperative complication. Electronically Signed   By: Keith Rake M.D.   On: 10/02/2022 15:08    Disposition: Discharge disposition: 06-Home-Health Care Svc          Follow-up Information     Mcarthur Rossetti, MD Follow up in 2 week(s).   Specialty: Orthopedic Surgery Contact information: [address] [place] [address] [phone]                  Signed: Erskine Emery 10/03/2022, 8:39 AM

## 2022-10-03 NOTE — Progress Notes (Signed)
Subjective: 1 Day Post-Op Procedure(s) (LRB): RIGHT TOTAL KNEE ARTHROPLASTY (Right) Patient reports pain as mild.  States she had a rough night between pain meds that she feels may have been too strong and issues with night staff. Comfortable now and wanting to go home later today.   Objective: Vital signs in last 24 hours: Temp:  [97.9 F (36.6 C)-98.3 F (36.8 C)] 98.3 F (36.8 C) (03/06 0808) Pulse Rate:  [59-94] 81 (03/06 0808) Resp:  [11-18] 18 (03/06 0808) BP: (126-166)/(50-78) 136/65 (03/06 0808) SpO2:  [96 %-100 %] 98 % (03/06 0808) Weight:  [70.3 kg] 70.3 kg (03/05 1008)  Intake/Output from previous day: 03/05 0701 - 03/06 0700 In: 1462.5 [I.V.:1412.5; IV Piggyback:50] Out: [id number] [Urine:970; Blood:100] Intake/Output this shift: No intake/output data recorded.  Recent Labs    10/03/22 0433  HGB 11.9*   Recent Labs    10/03/22 0433  WBC 15.6*  RBC 3.87  HCT 34.2*  PLT 257   Recent Labs    10/03/22 0433  NA 141  K 3.3*  CL 108  CO2 23  BUN 20  CREATININE 0.90  GLUCOSE 159*  CALCIUM 9.5   No results for input(s): "LABPT", "INR" in the last 72 hours.  Sensation intact distally Intact pulses distally Incision: dressing C/D/I Compartment soft   Assessment/Plan: 1 Day Post-Op Procedure(s) (LRB): RIGHT TOTAL KNEE ARTHROPLASTY (Right) Up with therapy Discharge home with home health Possible discharge to home later today if remains stable and progresses well with PT.     Debbie Cardenas 10/03/2022, 8:15 AM

## 2022-10-03 NOTE — Discharge Instructions (Signed)

## 2022-10-03 NOTE — Progress Notes (Signed)
Physical Therapy Treatment Patient Details Name: Debbie Cardenas MRN: [id number] DOB: [birth year] Today's Date: 10/03/2022   History of Present Illness 77 y.o. female presents to [facility] on 10/02/2022 for elective R TKA. PMH includes CABG x 4, HTN, HLD, OA, anxiety.    PT Comments    Pt greeted supine in bed and agreeable to session with good progress towards cute goals. Pt with some noted anxiety throughout session, with tremor and and pt stated nervousness, however resolving with breathing techniques and increased encouragement. Pt requiring grossly min guard assist for functional transfers and gait with RW support with pt progressing from step-to pattern to step-through pattern with increased distance. Pt able to ascend/descend 5 steps in stairwell without fault and min assist to steady and cues for sequencing with pt able to recall at end of session. Pt spouse present and supportive throughout session. Pt educated re; HEP and compliance, continued walker use to maximize functional independence, safety, and decrease risk for falls, importance of continued mobility and ice with pt and pt spouse verbalizing understanding. Anticipate safe discharge, with assist level outlined below, once medically cleared, will continue to follow acutely.     Recommendations for follow up therapy are one component of a multi-disciplinary discharge planning process, led by the attending physician.  Recommendations may be updated based on patient status, additional functional criteria and insurance authorization.  Follow Up Recommendations  Follow physician's recommendations for discharge plan and follow up therapies     Assistance Recommended at Discharge Intermittent Supervision/Assistance  Patient can return home with the following A little help with walking and/or transfers;A little help with bathing/dressing/bathroom;Assistance with cooking/housework;Assist for transportation;Help with stairs or ramp for  entrance   Equipment Recommendations  None recommended by PT    Recommendations for Other Services       Precautions / Restrictions Precautions Precautions: Knee;Fall Precaution Booklet Issued: Yes (comment) Required Braces or Orthoses: Knee Immobilizer - Right Restrictions Weight Bearing Restrictions: Yes RLE Weight Bearing: Weight bearing as tolerated     Mobility  Bed Mobility Overal bed mobility: Needs Assistance Bed Mobility: Supine to Sit, Sit to Supine     Supine to sit: HOB elevated, Min assist Sit to supine: Min assist   General bed mobility comments: light asssit to bring RLE to and off EOB and to return to bed    Transfers Overall transfer level: Needs assistance Equipment used: Rolling walker (2 wheels) Transfers: Sit to/from Stand Sit to Stand: Min guard           General transfer comment: min guard for safety, good hand placement on bed    Ambulation/Gait Ambulation/Gait assistance: Min guard Gait Distance (Feet): 350 Feet Assistive device: Rolling walker (2 wheels) Gait Pattern/deviations: Step-through pattern Gait velocity: reduced     General Gait Details: antalgic like gait intitially with step-to pattern progresing to step-through pattern with increased distance, some drifting to R, however anticipate result of bari-RW use   Stairs Stairs: Yes Stairs assistance: Min assist Stair Management: One rail Right, Step to pattern, Forwards Number of Stairs: 5 General stair comments: up/down steps in stairwell with cues for sequencing with pt able to recall at end of session, no LOB, pt with some noted anxiety with stair negotitation resolving with breathing techniques and encouragement   Wheelchair Mobility    Modified Rankin (Stroke Patients Only)       Balance Overall balance assessment: Needs assistance Sitting-balance support: No upper extremity supported, Feet supported Sitting balance-Leahy Scale: Good  Standing balance  support: Bilateral upper extremity supported, Reliant on assistive device for balance Standing balance-Leahy Scale: Poor Standing balance comment: reliance on UE support                            Cognition Arousal/Alertness: Awake/alert Behavior During Therapy: WFL for tasks assessed/performed Overall Cognitive Status: Within Functional Limits for tasks assessed                                          Exercises General Exercises - Lower Extremity Ankle Circles/Pumps: Right, Left, 10 reps, Supine Quad Sets: Right, 5 reps Heel Slides: AAROM, Right, 5 reps Hip ABduction/ADduction: AROM, Right, 5 reps Straight Leg Raises: AROM, AAROM, Right, 5 reps    General Comments General comments (skin integrity, edema, etc.): VSS on RA, spouse present and support throughotu session      Pertinent Vitals/Pain Pain Assessment Pain Assessment: Faces Faces Pain Scale: Hurts little more Pain Location: R knee Pain Descriptors / Indicators: Aching, Burning Pain Intervention(s): Monitored during session, Limited activity within patient's tolerance, Repositioned    Home Living                          Prior Function            PT Goals (current goals can now be found in the care plan section) Acute Rehab PT Goals PT Goal Formulation: With patient Time For Goal Achievement: 10/06/22 Progress towards PT goals: Progressing toward goals    Frequency    7X/week      PT Plan      Co-evaluation              AM-PAC PT "6 Clicks" Mobility   Outcome Measure  Help needed turning from your back to your side while in a flat bed without using bedrails?: A Little Help needed moving from lying on your back to sitting on the side of a flat bed without using bedrails?: A Little Help needed moving to and from a bed to a chair (including a wheelchair)?: A Little Help needed standing up from a chair using your arms (e.g., wheelchair or bedside chair)?:  A Little Help needed to walk in hospital room?: A Little Help needed climbing 3-5 steps with a railing? : A Lot 6 Click Score: 17    End of Session Equipment Utilized During Treatment: Right knee immobilizer;Gait belt Activity Tolerance: Patient tolerated treatment well Patient left: with call bell/phone within reach;with family/visitor present;in bed Nurse Communication: Mobility status;Other (comment) (no need for PM session) PT Visit Diagnosis: Other abnormalities of gait and mobility (R26.89);Muscle weakness (generalized) (M62.81);Pain Pain - Right/Left: Right Pain - part of body: Knee     Time: [id number] PT Time Calculation (min) (ACUTE ONLY): 36 min  Charges:  $Gait Training: 8-22 mins $Therapeutic Exercise: 8-22 mins                     Terry Bolotin R. PTA Acute Rehabilitation Services Office: [place] 10/03/2022, 11:25 AM

## 2022-10-04 ENCOUNTER — Encounter (HOSPITAL_COMMUNITY): Payer: Self-pay | Admitting: Orthopaedic Surgery

## 2022-10-04 ENCOUNTER — Encounter: Payer: Self-pay | Admitting: Radiology

## 2022-10-09 ENCOUNTER — Observation Stay (HOSPITAL_COMMUNITY)
Admission: EM | Admit: 2022-10-09 | Discharge: 2022-10-11 | Disposition: A | Discharge status: Home Health | Payer: HMO | Attending: Student | Admitting: Student

## 2022-10-09 ENCOUNTER — Other Ambulatory Visit: Payer: Self-pay

## 2022-10-09 ENCOUNTER — Emergency Department (HOSPITAL_COMMUNITY): Payer: HMO

## 2022-10-09 DIAGNOSIS — I480 Paroxysmal atrial fibrillation: Principal | ICD-10-CM | POA: Insufficient documentation

## 2022-10-09 DIAGNOSIS — I10 Essential (primary) hypertension: Secondary | ICD-10-CM | POA: Diagnosis not present

## 2022-10-09 DIAGNOSIS — Z87891 Personal history of nicotine dependence: Secondary | ICD-10-CM | POA: Insufficient documentation

## 2022-10-09 DIAGNOSIS — E785 Hyperlipidemia, unspecified: Secondary | ICD-10-CM | POA: Diagnosis present

## 2022-10-09 DIAGNOSIS — Z96651 Presence of right artificial knee joint: Secondary | ICD-10-CM

## 2022-10-09 DIAGNOSIS — I700 Atherosclerosis of aorta: Secondary | ICD-10-CM | POA: Diagnosis not present

## 2022-10-09 DIAGNOSIS — R079 Chest pain, unspecified: Secondary | ICD-10-CM | POA: Diagnosis not present

## 2022-10-09 DIAGNOSIS — R0789 Other chest pain: Secondary | ICD-10-CM | POA: Diagnosis present

## 2022-10-09 DIAGNOSIS — D649 Anemia, unspecified: Secondary | ICD-10-CM | POA: Diagnosis not present

## 2022-10-09 DIAGNOSIS — R413 Other amnesia: Secondary | ICD-10-CM | POA: Insufficient documentation

## 2022-10-09 DIAGNOSIS — I447 Left bundle-branch block, unspecified: Secondary | ICD-10-CM | POA: Insufficient documentation

## 2022-10-09 DIAGNOSIS — Z79899 Other long term (current) drug therapy: Secondary | ICD-10-CM | POA: Diagnosis not present

## 2022-10-09 DIAGNOSIS — I25119 Atherosclerotic heart disease of native coronary artery with unspecified angina pectoris: Secondary | ICD-10-CM | POA: Diagnosis present

## 2022-10-09 DIAGNOSIS — R7989 Other specified abnormal findings of blood chemistry: Secondary | ICD-10-CM | POA: Diagnosis not present

## 2022-10-09 DIAGNOSIS — I4891 Unspecified atrial fibrillation: Secondary | ICD-10-CM

## 2022-10-09 DIAGNOSIS — I25118 Atherosclerotic heart disease of native coronary artery with other forms of angina pectoris: Secondary | ICD-10-CM | POA: Diagnosis not present

## 2022-10-09 DIAGNOSIS — E7849 Other hyperlipidemia: Secondary | ICD-10-CM

## 2022-10-09 DIAGNOSIS — E876 Hypokalemia: Secondary | ICD-10-CM | POA: Diagnosis not present

## 2022-10-09 DIAGNOSIS — R410 Disorientation, unspecified: Secondary | ICD-10-CM | POA: Insufficient documentation

## 2022-10-09 DIAGNOSIS — E669 Obesity, unspecified: Secondary | ICD-10-CM | POA: Insufficient documentation

## 2022-10-09 DIAGNOSIS — R0689 Other abnormalities of breathing: Secondary | ICD-10-CM | POA: Diagnosis not present

## 2022-10-09 DIAGNOSIS — Z951 Presence of aortocoronary bypass graft: Secondary | ICD-10-CM | POA: Diagnosis not present

## 2022-10-09 DIAGNOSIS — Z96653 Presence of artificial knee joint, bilateral: Secondary | ICD-10-CM | POA: Insufficient documentation

## 2022-10-09 DIAGNOSIS — Z7982 Long term (current) use of aspirin: Secondary | ICD-10-CM | POA: Diagnosis not present

## 2022-10-09 DIAGNOSIS — J810 Acute pulmonary edema: Secondary | ICD-10-CM

## 2022-10-09 DIAGNOSIS — J811 Chronic pulmonary edema: Secondary | ICD-10-CM | POA: Insufficient documentation

## 2022-10-09 DIAGNOSIS — I159 Secondary hypertension, unspecified: Secondary | ICD-10-CM

## 2022-10-09 LAB — LIPID PANEL
Cholesterol: 144 mg/dL (ref 0–200)
HDL: 44 mg/dL (ref >40)
LDL Cholesterol: 83 mg/dL (ref 0–99)
Total CHOL/HDL Ratio: 3.3 RATIO
Triglycerides: 84 mg/dL (ref <150)
VLDL: 17 mg/dL (ref 0–40)

## 2022-10-09 LAB — BRAIN NATRIURETIC PEPTIDE: B Natriuretic Peptide: 185.5 pg/mL — ABNORMAL HIGH (ref 0.0–100.0)

## 2022-10-09 LAB — BASIC METABOLIC PANEL
Anion gap: 12 (ref 5–15)
BUN: 24 mg/dL — ABNORMAL HIGH (ref 8–23)
CO2: 24 mmol/L (ref 22–32)
Calcium: 9.1 mg/dL (ref 8.9–10.3)
Chloride: 105 mmol/L (ref 98–111)
Creatinine, Ser: 0.88 mg/dL (ref 0.44–1.00)
GFR, Estimated: >60 mL/min (ref >60)
Glucose, Bld: 129 mg/dL — ABNORMAL HIGH (ref 70–99)
Potassium: 3 mmol/L — ABNORMAL LOW (ref 3.5–5.1)
Sodium: 141 mmol/L (ref 135–145)

## 2022-10-09 LAB — CBC WITH DIFFERENTIAL/PLATELET
Abs Immature Granulocytes: 0.07 10*3/uL (ref 0.00–0.07)
Basophils Absolute: 0.1 10*3/uL (ref 0.0–0.1)
Basophils Relative: 1 %
Eosinophils Absolute: 0.1 10*3/uL (ref 0.0–0.5)
Eosinophils Relative: 2 %
HCT: 29.6 % — ABNORMAL LOW (ref 36.0–46.0)
Hemoglobin: 10.1 g/dL — ABNORMAL LOW (ref 12.0–15.0)
Immature Granulocytes: 1 %
Lymphocytes Relative: 18 %
Lymphs Abs: 1.6 10*3/uL (ref 0.7–4.0)
MCH: 31 pg (ref 26.0–34.0)
MCHC: 34.1 g/dL (ref 30.0–36.0)
MCV: 90.8 fL (ref 80.0–100.0)
Monocytes Absolute: 0.9 10*3/uL (ref 0.1–1.0)
Monocytes Relative: 10 %
Neutro Abs: 6.1 10*3/uL (ref 1.7–7.7)
Neutrophils Relative %: 68 %
Platelets: 309 10*3/uL (ref 150–400)
RBC: 3.26 MIL/uL — ABNORMAL LOW (ref 3.87–5.11)
RDW: 13.7 % (ref 11.5–15.5)
WBC: 8.8 10*3/uL (ref 4.0–10.5)
nRBC: 0 % (ref 0.0–0.2)

## 2022-10-09 LAB — TSH: TSH: 0.591 u[IU]/mL (ref 0.350–4.500)

## 2022-10-09 LAB — TROPONIN I (HIGH SENSITIVITY)
Troponin I (High Sensitivity): 10 ng/L (ref <18)
Troponin I (High Sensitivity): 58 ng/L — ABNORMAL HIGH (ref <18)

## 2022-10-09 MED ORDER — APIXABAN 5 MG PO TABS: 5.0000 mg | ORAL_TABLET | Freq: Two times a day (BID) | ORAL | Status: DC

## 2022-10-09 MED ORDER — HYDROCODONE-ACETAMINOPHEN 5-325 MG PO TABS: 1.0000 | ORAL_TABLET | ORAL | Status: DC | PRN

## 2022-10-09 MED ORDER — ATORVASTATIN CALCIUM 10 MG PO TABS
10.0000 mg | ORAL_TABLET | Freq: Every day | ORAL | Status: DC
  Administered 2022-10-09 – 2022-10-11 (×3): 10 mg via ORAL
  Filled 2022-10-09 (×3): qty 1

## 2022-10-09 MED ORDER — DILTIAZEM HCL-DEXTROSE 125-5 MG/125ML-% IV SOLN (PREMIX)
5.0000 mg/h | INTRAVENOUS | Status: DC
  Administered 2022-10-09: 5 mg/h via INTRAVENOUS
  Filled 2022-10-09: qty 125

## 2022-10-09 MED ORDER — SODIUM CHLORIDE 0.9% FLUSH
3.0000 mL | Freq: Two times a day (BID) | INTRAVENOUS | Status: DC
  Administered 2022-10-09 – 2022-10-10 (×3): 3 mL via INTRAVENOUS

## 2022-10-09 MED ORDER — ACETAMINOPHEN 650 MG RE SUPP: 650.0000 mg | Freq: Four times a day (QID) | RECTAL | Status: DC | PRN

## 2022-10-09 MED ORDER — IOHEXOL 350 MG/ML SOLN
75.0000 mL | Freq: Once | INTRAVENOUS | Status: AC | PRN
  Administered 2022-10-09: 75 mL via INTRAVENOUS

## 2022-10-09 MED ORDER — ONDANSETRON HCL 4 MG/2ML IJ SOLN: 4.0000 mg | Freq: Four times a day (QID) | INTRAMUSCULAR | Status: DC | PRN

## 2022-10-09 MED ORDER — FUROSEMIDE 10 MG/ML IJ SOLN
40.0000 mg | Freq: Once | INTRAMUSCULAR | Status: AC
  Administered 2022-10-09: 40 mg via INTRAVENOUS
  Filled 2022-10-09: qty 4

## 2022-10-09 MED ORDER — ALBUTEROL SULFATE (2.5 MG/3ML) 0.083% IN NEBU: 2.5000 mg | INHALATION_SOLUTION | Freq: Four times a day (QID) | RESPIRATORY_TRACT | Status: DC | PRN

## 2022-10-09 MED ORDER — ACETAMINOPHEN 325 MG PO TABS: 650.0000 mg | ORAL_TABLET | Freq: Four times a day (QID) | ORAL | Status: DC | PRN

## 2022-10-09 MED ORDER — POTASSIUM CHLORIDE CRYS ER 20 MEQ PO TBCR
40.0000 meq | EXTENDED_RELEASE_TABLET | ORAL | Status: AC
  Administered 2022-10-09: 40 meq via ORAL
  Filled 2022-10-09: qty 2

## 2022-10-09 MED ORDER — POTASSIUM CHLORIDE CRYS ER 20 MEQ PO TBCR
40.0000 meq | EXTENDED_RELEASE_TABLET | Freq: Once | ORAL | Status: AC
  Administered 2022-10-09: 40 meq via ORAL
  Filled 2022-10-09: qty 2

## 2022-10-09 MED ORDER — ONDANSETRON HCL 4 MG PO TABS: 4.0000 mg | ORAL_TABLET | Freq: Four times a day (QID) | ORAL | Status: DC | PRN

## 2022-10-09 MED ORDER — MAGNESIUM SULFATE 2 GM/50ML IV SOLN
2.0000 g | Freq: Once | INTRAVENOUS | Status: AC
  Administered 2022-10-09: 2 g via INTRAVENOUS
  Filled 2022-10-09: qty 50

## 2022-10-09 MED ORDER — HEPARIN BOLUS VIA INFUSION
4000.0000 [IU] | Freq: Once | INTRAVENOUS | Status: AC
  Administered 2022-10-09: 4000 [IU] via INTRAVENOUS
  Filled 2022-10-09: qty 4000

## 2022-10-09 MED ORDER — LORAZEPAM 0.5 MG PO TABS: 0.5000 mg | ORAL_TABLET | Freq: Every day | ORAL | Status: DC

## 2022-10-09 MED ORDER — AMLODIPINE BESYLATE 5 MG PO TABS
5.0000 mg | ORAL_TABLET | Freq: Every day | ORAL | Status: DC
  Administered 2022-10-10 – 2022-10-11 (×2): 5 mg via ORAL
  Filled 2022-10-09 (×2): qty 1

## 2022-10-09 MED ORDER — SODIUM CHLORIDE 0.9 % IV BOLUS
1000.0000 mL | Freq: Once | INTRAVENOUS | Status: AC
  Administered 2022-10-09: 1000 mL via INTRAVENOUS

## 2022-10-09 MED ORDER — LORAZEPAM 0.5 MG PO TABS: 0.5000 mg | ORAL_TABLET | Freq: Four times a day (QID) | ORAL | Status: DC | PRN

## 2022-10-09 MED ORDER — LORAZEPAM 0.5 MG PO TABS
0.5000 mg | ORAL_TABLET | Freq: Four times a day (QID) | ORAL | Status: DC | PRN
  Administered 2022-10-09: 0.5 mg via ORAL
  Filled 2022-10-09: qty 1

## 2022-10-09 MED ORDER — METOPROLOL TARTRATE 12.5 MG HALF TABLET
12.5000 mg | ORAL_TABLET | Freq: Two times a day (BID) | ORAL | Status: DC
  Administered 2022-10-09 – 2022-10-11 (×5): 12.5 mg via ORAL
  Filled 2022-10-09 (×5): qty 1

## 2022-10-09 MED ORDER — HEPARIN (PORCINE) 25000 UT/250ML-% IV SOLN
1000.0000 [IU]/h | INTRAVENOUS | Status: DC
  Administered 2022-10-09: 1000 [IU]/h via INTRAVENOUS
  Filled 2022-10-09: qty 250

## 2022-10-09 MED ORDER — APIXABAN 5 MG PO TABS
5.0000 mg | ORAL_TABLET | Freq: Two times a day (BID) | ORAL | Status: DC
  Administered 2022-10-09 – 2022-10-11 (×4): 5 mg via ORAL
  Filled 2022-10-09 (×4): qty 1

## 2022-10-09 NOTE — Consult Note (Addendum)
Cardiology Consultation   Patient ID: Debbie Cardenas MRN: [id number]; DOB: 01-10-1946  Admit date: 10/09/2022 Date of Consult: 10/09/2022  PCP:  Debbie Cardenas, [place] [facility] Cardiologist:  Peter Martinique, MD   {    Patient Profile:   Debbie Cardenas is a 77 y.o. female with a hx of atrial fibrillation, CAD, hypertension, hyperlipidemia, strong family history of CAD, degenerative arthritis of both knees who is being seen 10/09/2022 for the evaluation of new onset of chest pain accompanied with atrial fibrillation at the request of Dr. Tamala Cardenas.  History of Present Illness:   Ms. Debbie Cardenas has cardiac history that involves a coronary CTA that found a CAC score of over 1000. She was deemed high risk so a cardiac catherization was performed 07/27/2020 which showed 70% mid-distal left main stenosis.  50% ostial-proximal LAD stenosis and 70% proximal mid LAD stenosis, moderate size diagonal branch was noted off the area of stenosis, first marginal was noted to have 50% stenosis, ostium of RCA had 90% stenosis, and LV EF was 55-60% with normal LVEDP.  She was noted to have complex anatomy and was not a viable candidate for a stent so instead she underwent CABG x4 by Dr. Cyndia Cardenas on 08/05/2020 and developed post operative A-fibon day 5 and d/c on eliquis and amio. TEE during procedure showed an ejection fraction of 65% with no wall motion abnormalities or evidence of RV or LV dysfunction. On follow up she was found to be back in NSR. Since then she has been off amio and had complained of rare palpations mostly at night.  Additionally, patient has history of being intolerant to statins. She has been unable to take Crestor even at low-dose 5 mg, but has been able to tolerate lipitor '10mg'$  with minor complaints of nausea.   Last night patient states that she was sleeping and then woke up to 8 out of 10 sharp chest pain that was radiating to both shoulders.  She says the pain is more  localized in her right upper quadrant but states she had accompanied tingling mouth sensation and diaphoresis.  Patient states that she normally does not have chest pain and it has been several years since she has felt something like that.  During the same night she said she could feel her heart racing and started to have heart palpitations. She also states that she has history of panic disorder but has not had panic attacks for several years. Patient denies any current chest pain, SOB, or palpiations and is feeling fine otherwise.  During exam patient began to be somewhat tearful and frustrated with her memory loss that has been progressive.  She says she struggles to articulate what she wants to say and often forgets things.  Patient says she is been through a lot lately and worried about her health.  ED workup: K+ 3.0, BNP 185.5, increasing trops from 10-58, normal TSH, neg CXR for acute disease but noted cardiomegaly, neg PE, mild pulmonary edema, pending echo. Initial EKG showed Afib with RVR rate of 150 and she was started on IV diltiazem and then was later stopped after converting spontaneously to NSR with controlled rate of 58 on new EKG. She is also on heparin and IV potassium and given IV lasix '40mg'$ .    Past Medical History:  Diagnosis Date   Anxiety    Arthritis    knees   Closed fracture of neck of left femur ([place]) 08/15/2018   Coronary artery  disease    Hyperlipidemia    Hypertension    Pneumonia     Past Surgical History:  Procedure Laterality Date   APPENDECTOMY     CARDIAC CATHETERIZATION     CATARACT EXTRACTION, BILATERAL     CHOLECYSTECTOMY     CORONARY ARTERY BYPASS GRAFT N/A 08/15/2020   Procedure: CORONARY ARTERY BYPASS GRAFTING (CABG)X4. USING BILATERAL MAMMARY ARTERIES AND RIGHT GREATER SAPHENOUS VEIN HARVESTED ENDOSCOPICALLY.;  Surgeon: Debbie Pollack, MD;  Location: [place] [facility];  Service: Open Heart Surgery;  Laterality: N/A;   EYE SURGERY     bilateral cataract removal    KNEE SURGERY     LEFT HEART CATH AND CORONARY ANGIOGRAPHY N/A 07/27/2020   Procedure: LEFT HEART CATH AND CORONARY ANGIOGRAPHY;  Surgeon: Martinique, Peter M, MD;  Location: [facility];  Service: Cardiovascular;  Laterality: N/A;   TEE WITHOUT CARDIOVERSION N/A 08/15/2020   Procedure: TRANSESOPHAGEAL ECHOCARDIOGRAM (TEE);  Surgeon: Debbie Pollack, MD;  Location: [place];  Service: Open Heart Surgery;  Laterality: N/A;   TOTAL HIP ARTHROPLASTY Left 08/15/2018   Procedure: TOTAL HIP ARTHROPLASTY ANTERIOR APPROACH;  Surgeon: Debbie Rossetti, MD;  Location: [facility];  Service: Orthopedics;  Laterality: Left;   TOTAL KNEE ARTHROPLASTY Right 10/02/2022   Procedure: RIGHT TOTAL KNEE ARTHROPLASTY;  Surgeon: Debbie Rossetti, MD;  Location: [place];  Service: Orthopedics;  Laterality: Right;    Inpatient Medications: Scheduled Meds:  atorvastatin  10 mg Oral Daily   sodium chloride flush  3 mL Intravenous Q12H   sodium chloride flush  3 mL Intravenous Q12H   Continuous Infusions:  heparin 1,000 Units/hr (10/09/22 1350)   PRN Meds: acetaminophen **OR** acetaminophen, albuterol, HYDROcodone-acetaminophen, ondansetron **OR** ondansetron (ZOFRAN) IV  Allergies:    Allergies  Allergen Reactions   Tramadol Itching    Pt itches the day after taking but can tolerate it   Ephedrine     "My dentist told me to never let anyone give me this"   Ace Inhibitors Cough    Social History:   Social History   Socioeconomic History   Marital status: Married    Spouse name: Not on file   Number of children: 3   Years of education: Not on file   Highest education level: Not on file  Occupational History   Occupation: Retired  Tobacco Use   Smoking status: Former    Types: Cigarettes    Quit date: 06/13/2014    Years since quitting: 8.3   Smokeless tobacco: Never  Vaping Use   Vaping Use: Former  Substance and Sexual Activity   Alcohol use: Yes    Comment: Occasionally   Drug  use: Never   Sexual activity: Not on file  Other Topics Concern   Not on file  Social History Narrative   Not on file   Social Determinants of Health   Financial Resource Strain: Not on file  Food Insecurity: Not on file  Transportation Needs: Not on file  Physical Activity: Not on file  Stress: Not on file  Social Connections: Not on file  Intimate Partner Violence: Not on file    Family History:   Family History  Problem Relation Age of Onset   Cancer Sister    Heart disease Sister    Heart attack Brother    Heart disease Brother        s/p CABG   Heart attack Father    Heart disease Sister    Heart attack Brother  Heart disease Brother      ROS:  Please see the history of present illness.  All other ROS reviewed and negative.     Physical Exam/Data:   Vitals:   10/09/22 0930 10/09/22 0942 10/09/22 0945 10/09/22 1400  BP: (!) 133/58  (!) 115/57 (!) 128/53  Pulse: 69  62 62  Resp: 15  17 (!) 25  Temp:  99.2 F (37.3 C)  98 F (36.7 C)  TempSrc:  Oral    SpO2: 98%  95% 96%  Weight:      Height:        Intake/Output Summary (Last 24 hours) at 10/09/2022 1442 Last data filed at 10/09/2022 0645 Gross per 24 hour  Intake 1050 ml  Output 1000 ml  Net 50 ml      10/09/2022    1:27 AM 10/02/2022   10:08 AM 09/26/2022   12:53 PM  Last 3 Weights  Weight (lbs) 155 lb 155 lb 156 lb  Weight (kg) 70.308 kg 70.308 kg 70.761 kg     Body mass index is 25.79 kg/m.  General:  Well nourished, well developed, in no acute distress HEENT: normal Neck: no JVD Vascular: No carotid bruits; Distal pulses 2+ bilaterally Cardiac:  normal S1, S2; RRR; no murmur  Lungs:  clear to auscultation bilaterally, no wheezing, rhonchi or rales  Abd: soft, nontender, no hepatomegaly  Ext: no edema Musculoskeletal:  No deformities, BUE and BLE strength normal and equal.  Right leg swelling with incisional scar on right leg  Skin: warm and dry  Neuro:  CNs 2-12 intact, no focal  abnormalities noted Psych:  Normal affect   EKG:  The EKG was personally reviewed and demonstrates:  First EKG shows atrial fibrillation with a rate of 150 Subsequent EKG shows normal sinus rhythm with a rate of 58 Telemetry:  Telemetry was personally reviewed and demonstrates: Normal sinus rhythm rate of 60  Relevant CV Studies:  Echo pending  Cardiac catheterization 07/27/2020  Mid LM to Dist LM lesion is 70% stenosed. Ost LAD to Prox LAD lesion is 50% stenosed. Prox LAD to Mid LAD lesion is 70% stenosed. 1st Mrg lesion is 50% stenosed. Dist Cx lesion is 30% stenosed with 30% stenosed side branch in LPAV. Ost RCA to Prox RCA lesion is 90% stenosed. The left ventricular systolic function is normal. LV end diastolic pressure is normal. The left ventricular ejection fraction is 55-65% by visual estimate.   1. 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 Normal LV function 3. Normal LVEDP  Laboratory Data:  High Sensitivity Troponin:   Recent Labs  Lab 10/09/22 0125 10/09/22 0510  TROPONINIHS 10 58*     Chemistry Recent Labs  Lab 10/03/22 0433 10/09/22 0125  NA 141 141  K 3.3* 3.0*  CL 108 105  CO2 23 24  GLUCOSE 159* 129*  BUN 20 24*  CREATININE 0.90 0.88  CALCIUM 9.5 9.1  GFRNONAA >60 >60  ANIONGAP 10 12    No results for input(s): "PROT", "ALBUMIN", "AST", "ALT", "ALKPHOS", "BILITOT" in the last 168 hours. Lipids  Recent Labs  Lab 10/09/22 0125  CHOL 144  TRIG 84  HDL 44  LDLCALC 83  CHOLHDL 3.3    Hematology Recent Labs  Lab 10/03/22 0433 10/09/22 0125  WBC 15.6* 8.8  RBC 3.87 3.26*  HGB 11.9* 10.1*  HCT 34.2* 29.6*  MCV 88.4 90.8  MCH 30.7 31.0  MCHC 34.8 34.1  RDW 13.5 13.7  PLT 257 309   Thyroid  Recent Labs  Lab 10/09/22 0125  TSH 0.591    BNP Recent Labs  Lab 10/09/22 0125  BNP 185.5*    DDimer No results for input(s): "DDIMER" in the last 168 hours.   Radiology/Studies:   CT Angio Chest PE W and/or Wo Contrast  Result Date: 10/09/2022 CLINICAL DATA:  Syncope/presyncope, cerebrovascular cause suspected. Left-sided chest pain radiating to the left arm and palpitations EXAM: CT ANGIOGRAPHY CHEST WITH CONTRAST TECHNIQUE: Multidetector CT imaging of the chest was performed using the standard protocol during bolus administration of intravenous contrast. Multiplanar CT image reconstructions and MIPs were obtained to evaluate the vascular anatomy. RADIATION DOSE REDUCTION: This exam was performed according to the departmental dose-optimization program which includes automated exposure control, adjustment of the mA and/or kV according to patient size and/or use of iterative reconstruction technique. CONTRAST:  69m OMNIPAQUE IOHEXOL 350 MG/ML SOLN COMPARISON:  CT chest heart morphology 07/08/2020 and radiographs 10/09/2022 FINDINGS: Cardiovascular: Satisfactory opacification of the pulmonary arteries to the segmental level. No evidence of pulmonary embolism. Cardiomegaly. No pericardial effusion. Coronary artery and aortic atherosclerotic calcification. Sternotomy and CABG. The main pulmonary artery is mildly dilated measuring 35 mm. Mediastinum/Nodes: 9 mm right thyroid nodule. No follow-up recommended. Unremarkable esophagus. No thoracic adenopathy. Lungs/Pleura: Diffuse interlobular septal thickening with mild patchy ground-glass opacities. No pleural effusion or pneumothorax. The central airways are patent. Upper Abdomen: No acute abnormality.  Cholecystectomy. Musculoskeletal: Unchanged compression deformity of T11. No acute fracture. Review of the MIP images confirms the above findings. IMPRESSION: 1. No evidence of pulmonary embolism. 2. Diffuse interlobular septal thickening with mild patchy ground-glass opacities, favored to represent mild pulmonary edema versus atypical infection. 3. Dilated main pulmonary artery can be seen with pulmonary hypertension. Aortic Atherosclerosis  (ICD10-I70.0). Electronically Signed   By: TPlacido SouM.D.   On: 10/09/2022 02:48   DG Chest Portable 1 View  Result Date: 10/09/2022 CLINICAL DATA:  Pain. EXAM: PORTABLE CHEST 1 VIEW COMPARISON:  09/21/2020. FINDINGS: The heart is enlarged and the mediastinal contour is within normal limits. No consolidation, effusion, or pneumothorax. Sternotomy wires are present over the midline. No acute osseous abnormality. IMPRESSION: Cardiomegaly with no active disease. Electronically Signed   By: LBrett FairyM.D.   On: 10/09/2022 01:46     Assessment and Plan:   Demand ischemia with history of CAD s/p CABG x 4 (2022) Presented with chest pain in the setting of Afib with RVR. She has mildly elevated troponin's of 58 increased from 10, but overall unremarkable labs/imaging with non ischemic changes on EKG thus far. Patient does have a strong family history of heart disease and recent MVD requiring CABG surgery. However, this occurrence of chest pain has been in the setting of recurrent A-Fib with RVR. Seem consistent with demand ischemia considering resolution of symptoms since being back in NSR. If she has continuation of anginal symptoms with normal sinus rhythm, can consider further ischemic evaluation.  Post operative atrial fibrillation s/p TKA 10/02/2022 Patient with history of post operative atrial fibrillation that occurred after CABG in 2022 procedure with no recurrence until now s/p TKA. In the ED she was started on IV diltiazem but later converted back to normal sinus rhythm shortly after.  Overall, patient infrequently has any symptoms related to her atrial fibrillation.  However she does report having palpitations last night.  Previously after CABG surgery she had atrial fibrillation and was placed on amio and Eliquis however she  has stopped that since.  Considering her recurring episodes of atrial fibrillation and elevated CHA2DS2-VASc score of 4, a DOAC is recommended. At home she is on  amlodipine and would also benefit from rate controlling medication such as low dose and short acting metoprolol with added BP/CAD benefit.  - continue heparin until echo results come back. If normal heart function can transition to eliquis.  - will start low dose lopressor 12.'5mg'$  BID given HR 60-70 in sinus rhythm  Elevated BNP with pulmonary edema  Previous intraoperative TEE in 2022 during CABG procedure demonstrated a 60 to 65% EF with normal functioning RV and LV.  She has an elevated BNP of 185 with chest x-ray showing cardiomegaly and CTA noting mild pulmonary edema.  On physical exam she is not noted to be volume overloaded.  Echo is pending and can help assess if there is evidence of heart failure. She was started on IV lasix '40mg'$  in ED. Appears euvolemic now and recommend holding off on additional diuretics.  - Recommendations pending evaluation of echocardiogram.  HTN Continue home amlodipine and add on low dose lopressor  HLD Continue atorvastatin '10mg'$   Risk Assessment/Risk Scores:    CHA2DS2-VASc Score = 4  This indicates a 4.8% annual risk of stroke. The patient's score is based upon: CHF History: 0 HTN History: 1 Diabetes History: 0 Stroke History: 0 Vascular Disease History: 0 Age Score: 2 Gender Score: 1   For questions or updates, please contact [place] Please consult [email or web address] for contact info under    Signed, Bonnee Quin, PA-C  10/09/2022 2:42 PM

## 2022-10-09 NOTE — Progress Notes (Signed)
Patient currently back in a sinus rhythm and has been seen by cardiology with recommendations to start Eliquis and adding on metoprolol 12.5 mg twice daily.  Patient's son made note of concern of the patient to be actively hallucinating seeing doors that were not there and stating that he was trying to get her killed.  He reported that she had been having some confusion after the procedure, but not like what she had been doing today.  No focal weakness is noted on physical exam.  Son reports that she had gotten out of bed and had ripped off all of the EKG leads.  Questions cause for her being acutely altered as this is not how she normally acts.  Orders for bed alarm placed.

## 2022-10-09 NOTE — Progress Notes (Signed)
ANTICOAGULATION CONSULT NOTE - Follow Up Consult  Pharmacy Consult for Heparin to Eliquis  Indication: atrial fibrillation  Allergies  Allergen Reactions   Tramadol Itching    Pt itches the day after taking but can tolerate it   Ephedrine     "My dentist told me to never let anyone give me this"   Ace Inhibitors Cough    Patient Measurements: Height: '5\' 5"'$  (165.1 cm) Weight: 70.3 kg (155 lb) IBW/kg (Calculated) : 57 Heparin Dosing Weight: 70.3 kg  Vital Signs: Temp: 98 F (36.7 C) (03/12 1400) Temp Source: Oral (03/12 0942) BP: 128/53 (03/12 1400) Pulse Rate: 62 (03/12 1400)  Labs: Recent Labs    10/09/22 0125 10/09/22 0510  HGB 10.1*  --   HCT 29.6*  --   PLT 309  --   CREATININE 0.88  --   TROPONINIHS 10 58*     Estimated Creatinine Clearance: 54.3 mL/min (by C-G formula based on SCr of 0.88 mg/dL).   Medical History: Past Medical History:  Diagnosis Date   Anxiety    Arthritis    knees   Closed fracture of neck of left femur ([place]) 08/15/2018   Coronary artery disease    Hyperlipidemia    Hypertension    Pneumonia     Medications:   Assessment: 46 YOF with PMH of HLD, HTN, Afib, CAD presented on 3/12 with Afib with RVR and pulmonary edema. Not on anticoagulation prior to admission. Chronic anemia stable, platelets normal. Renal function stable.   Goal of Therapy:  Heparin level 0.3-0.7 units/ml Monitor platelets by anticoagulation protocol: Yes   Plan:  Orders to transition from heparin to Eliquis starting now.  Stop heparin, start Eliqius '5mg'$  BID. (Age < 87, weight > 60kg, Scr < 1.5)  Monitor for s/sx of bleeding.  Price check pending.   Esmeralda Arthur, PharmD, BCCCP  10/09/2022,3:58 PM

## 2022-10-09 NOTE — ED Notes (Signed)
Admission team at bedside.

## 2022-10-09 NOTE — H&P (Addendum)
History and Physical    Patient: Debbie Cardenas [id number] DOB: 03-18-1946 DOA: 10/09/2022 DOS: the patient was seen and examined on 10/09/2022 PCP: Harrison Mons, [place]  Patient coming from: Home (Lives with husband) via EMS  Chief Complaint:  Chief Complaint  Patient presents with   Chest Pain   HPI: Debbie Cardenas is a 77 y.o. 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  Patient is somewhat of a poor historian and additional information obtained from review of records and her husband over the phone.  Patient reports woken out of her sleep at around 1 AM this morning with reports of left-sided chest pain that radiated into her left shoulder and down her arm.  She reports pain is severe 10 out of 10 that was sharp and achy.  She tried to burp without improvement in symptoms.  Noted associated symptoms of palpitations, right leg pain. Patient recently underwent right total knee arthroplasty with Dr. Ninfa Linden on 3/5.  Denied having any recent nausea, vomiting, abdominal pain, nausea, vomiting, or diarrhea symptoms.  Patient reports that she previously had went into atrial fibrillation in the past after CABG surgery and had temporarily been on amiodarone and Eliquis.  She is followed by Dr. Martinique who had stopped the medications due to patient being in a sinus rhythm.  In the emergency department patient was noted to be afebrile with heart rates elevated into the 160s in atrial fibrillation with RVR, blood pressures relatively maintained, and O2 saturation maintained on room air.  Labs significant for hemoglobin 10.1, potassium 3, BUN 24, creatinine 0.88, high-sensitivity troponin 10->58.  Given recent surgery CT angiogram of the chest was performed and noted diffuse interlobar septal thickening with mild patchy  groundglass opacities favoring mild pulmonary edema versus atypical infection, and no signs of a pulmonary embolism.  Patient has been given 1 L of normal saline IV fluids, equivalents of potassium chloride,  magnesium sulfate 2 g IV, Lasix 40 mg IV, and started on a Cardizem drip.  Review of Systems: As mentioned in the history of present illness. All other systems reviewed and are negative. Past Medical History:  Diagnosis Date   Anxiety    Arthritis    knees   Closed fracture of neck of left femur ([place]) 08/15/2018   Coronary artery disease    Hyperlipidemia    Hypertension    Pneumonia    Past Surgical History:  Procedure Laterality Date   APPENDECTOMY     CARDIAC CATHETERIZATION     CATARACT EXTRACTION, BILATERAL     CHOLECYSTECTOMY     CORONARY ARTERY BYPASS GRAFT N/A 08/15/2020   Procedure: CORONARY ARTERY BYPASS GRAFTING (CABG)X4. USING BILATERAL MAMMARY ARTERIES AND RIGHT GREATER SAPHENOUS VEIN HARVESTED ENDOSCOPICALLY.;  Surgeon: Gaye Pollack, MD;  Location: [facility];  Service: Open Heart Surgery;  Laterality: N/A;   EYE SURGERY     bilateral cataract removal   KNEE SURGERY     LEFT HEART CATH AND CORONARY ANGIOGRAPHY N/A 07/27/2020   Procedure: LEFT HEART CATH AND CORONARY ANGIOGRAPHY;  Surgeon: Martinique, Peter M, MD;  Location: [facility];  Service: Cardiovascular;  Laterality: N/A;   TEE WITHOUT CARDIOVERSION N/A 08/15/2020   Procedure: TRANSESOPHAGEAL ECHOCARDIOGRAM (TEE);  Surgeon: Gaye Pollack, MD;  Location: [facility];  Service: Open Heart Surgery;  Laterality: N/A;   TOTAL HIP ARTHROPLASTY Left  08/15/2018   Procedure: TOTAL HIP ARTHROPLASTY ANTERIOR APPROACH;  Surgeon: Mcarthur Rossetti, MD;  Location: [facility];  Service: Orthopedics;  Laterality: Left;   TOTAL KNEE ARTHROPLASTY Right 10/02/2022   Procedure: RIGHT TOTAL KNEE ARTHROPLASTY;  Surgeon: Mcarthur Rossetti, MD;  Location: [facility];  Service: Orthopedics;  Laterality: Right;   Social History:   reports that she quit smoking about 8 years ago. Her smoking use included cigarettes. She has never used smokeless tobacco. She reports current alcohol use. She reports that she does not use drugs.  Allergies  Allergen Reactions   Tramadol Itching    Pt itches the day after taking but can tolerate it   Ephedrine     "My dentist told me to never let anyone give me this"   Ace Inhibitors Cough    Family History  Problem Relation Age of Onset   Cancer Sister    Heart disease Sister    Heart attack Brother    Heart disease Brother        s/p CABG   Heart attack Father    Heart disease Sister    Heart attack Brother    Heart disease Brother     Prior to Admission medications   Medication Sig Start Date End Date Taking? Authorizing Provider  amLODipine (NORVASC) 5 MG tablet TAKE 1 TABLET (5 MG TOTAL) BY MOUTH DAILY. 07/16/22   Martinique, Peter M, MD  aspirin 81 MG chewable tablet Chew 1 tablet (81 mg total) by mouth 2 (two) times daily. 10/03/22   Pete Pelt, PA-C  atorvastatin (LIPITOR) 10 MG tablet TAKE 1 TABLET BY MOUTH EVERY DAY 04/16/22   Martinique, Peter M, MD  Cholecalciferol (VITAMIN D) 50 MCG (2000 UT) tablet Take 2,000 Units by mouth daily.    [provider]  hydrochlorothiazide (MICROZIDE) 12.5 MG capsule TAKE 1 CAPSULE BY MOUTH EVERY DAY 08/20/22   Martinique, Peter M, MD  HYDROcodone-acetaminophen (NORCO/VICODIN) 5-325 MG tablet Take 1-2 tablets by mouth every 4 (four) hours as needed for moderate pain. 10/03/22   Pete Pelt, PA-C  MAGNESIUM OXIDE PO Take 500 mg by mouth daily.    [provider]  methocarbamol (ROBAXIN) 500 MG tablet Take 1 tablet (500 mg total) by mouth every 6 (six) hours as needed for muscle spasms. 10/03/22   Pete Pelt, PA-C  SODIUM FLUORIDE 5000 PPM 1.1 % PSTE Place 1 Application onto teeth at bedtime. 08/27/22   [provider]    Physical Exam: Vitals:   10/09/22 0405 10/09/22 0415 10/09/22 0500 10/09/22 0517  BP:  117/66 103/66 (!) 106/52   Pulse: (!) 139 (!) 141 60   Resp: '14 15 13   '$ Temp:    97.7 F (36.5 C)  SpO2: 96% 97% 95%   Weight:      Height:        Constitutional: Elderly female currently in no acute distress. Eyes: PERRL, lids and conjunctivae normal ENMT: Mucous membranes are moist. Posterior pharynx clear of any exudate or lesions.  Neck: normal, supple Respiratory: clear to auscultation bilaterally, no wheezing, no crackles. Normal respiratory effort.  O2 saturations maintained on room air Cardiovascular: Regular rate and rhythm, no murmurs / rubs / gallops.  No significant lower extremity edema. Abdomen: no tenderness, no masses palpated.  Bowel sounds positive.  Musculoskeletal: no clubbing / cyanosis. No joint deformity upper and lower extremities. Good ROM, no contractures. Normal muscle tone.  Skin: Postoperative wound noted of the right knee with  surrounding bruising appreciated. Neurologic: CN 2-12 grossly intact.   Strength 5/5 in all 4.  Psychiatric: Mild recent memory impairment.  Alert and oriented x 3.  Anxious mood.   Data Reviewed:  EKG revealed atrial fibrillation with RVR and 150 bpm.  Reviewed labs, imaging, and pertinent records as noted above in HPI.   Assessment and Plan:  Chest pain  Elevated troponin CAD status post CABG Patient presented with complaints of chest pain..  Initial high-sensitivity troponin 19 with repeat  58.  Patient with a prior history of CABG in 2022 after left heart cath from 06/2020 and noted left main with two-vessel obstructive CAD involving proximal to mid LAD and ostial RCA. -Admit to a progressive bed -Check lipid panel and continue to trend high-sensitivity troponin -Check echocardiogram -Cardiology consulted, we will follow-up for any further recommendations  Atrial fibrillation with RVR Acute.  Patient noted to be in atrial fibrillation with RVR with heart rates into the 160s. Prior history of atrial fibrillation post CABG  in 2022.  At that time she had been treated with Eliquis and amiodarone at that time, but had been noted to be in sinus rhythm on follow-up for which medications were ultimately discontinued following up with cardiology.  CHA2DS2-VASc score = at least 5-6 based off age, sex, HTN, vascular disease history, CHF history.  Patient initially been placed on a Cardizem drip. -Discontinue Cardizem drip due to soft blood pressures.  Patient appears to be back in sinus rhythm.  Will consider amiodarone if patient goes back into atrial fibrillation -Check TSH -Heparin drip per pharmacy with plan to transition to Eliquis if cardiology agrees. -Goal potassium at least 4 and magnesium at least 2  Pulmonary edema Acute.   Patient denies any complaints of shortness of breath.  No significant crackles noted on physical exam at this time.  Seen on CT angiogram of the chest.   Thought secondary to rapid A-fib with RVR.  She had been given Lasix 40 mg IV x 1 dose. -Continue to monitor O2 saturations and is on oxygen as needed.  Hypokalemia Acute.  Initial potassium noted to be 3.  Patient had been given 40 mEq of potassium chloride p.o. -Given additional 40 mEq of potassium chloride p.o. -Continue to monitor and replace as needed  Normocytic anemia Hemoglobin 10.1, but has been 11.9 postop day 1 from total knee replacement on 3/6.  Possibly related to revision surgery. -Continue to monitor H&H  Essential hypertension Blood pressures have initially been soft.  Home blood pressure regimen appears to include hydrochlorothiazide 12.5 mg daily and amlodipine 5 mg daily. -Held home blood pressure regimen.  Reevaluate when medically appropriate to resume  S/p right knee replacement Patient underwent right total knee replacement with Dr. Ninfa Linden on 3/5. -Hydrocodone as needed for pain -Continue previous.  Recommendations  Hyperlipidemia -Continue atorvastatin  Memory loss Patient notes mild short-term memory  loss.  Long-term memory is intact. -Delirium precautions  Med reconciliation performed based off recent fill dates of prescriptions  DVT prophylaxis: Heparin Advance Care Planning:   Code Status: Full Code   Consults: Cardiology  Family Communication: Patient's husband updated over the phone  Severity of Illness: The appropriate patient status for this patient is OBSERVATION. Observation status is judged to be reasonable and necessary in order to provide the required intensity of service to ensure the patient's safety. The patient's presenting symptoms, physical exam findings, and initial radiographic and laboratory data in the context of their medical condition is felt to  place them at decreased risk for further clinical deterioration. Furthermore, it is anticipated that the patient will be medically stable for discharge from the hospital within 2 midnights of admission.   Author: Norval Morton, MD 10/09/2022 7:34 AM  For on call review [email or web address].

## 2022-10-09 NOTE — Progress Notes (Addendum)
ANTICOAGULATION CONSULT NOTE - Initial Consult  Pharmacy Consult for Heparin Indication: atrial fibrillation  Allergies  Allergen Reactions   Tramadol Itching    Pt itches the day after taking but can tolerate it   Ephedrine     "My dentist told me to never let anyone give me this"   Ace Inhibitors Cough    Patient Measurements: Height: '5\' 5"'$  (165.1 cm) Weight: 70.3 kg (155 lb) IBW/kg (Calculated) : 57 Heparin Dosing Weight: 70.3 kg  Vital Signs: Temp: 97.7 F (36.5 C) (03/12 0517) BP: 122/63 (03/12 0730) Pulse Rate: 114 (03/12 0730)  Labs: Recent Labs    10/09/22 0125 10/09/22 0510  HGB 10.1*  --   HCT 29.6*  --   PLT 309  --   CREATININE 0.88  --   TROPONINIHS 10 58*    Estimated Creatinine Clearance: 54.3 mL/min (by C-G formula based on SCr of 0.88 mg/dL).   Medical History: Past Medical History:  Diagnosis Date   Anxiety    Arthritis    knees   Closed fracture of neck of left femur ([place]) 08/15/2018   Coronary artery disease    Hyperlipidemia    Hypertension    Pneumonia     Medications:   Assessment: 2 YOF with PMH of HLD, HTN, Afib, CAD presented on 3/12 with Afib with RVR and pulmonary edema. Not on anticoagulation prior to admission. Chronic anemia stable, platelets normal. Renal function stable.   Goal of Therapy:  Heparin level 0.3-0.7 units/ml Monitor platelets by anticoagulation protocol: Yes   Plan:  Give 4000 units bolus x 1 Start heparin infusion at 1000 units/hr Check 8-hr heparin level then daily heparin/CBC.   Keyri Salberg 10/09/2022,8:08 AM

## 2022-10-09 NOTE — ED Provider Notes (Signed)
[place] Provider Note   CSN: [id number] Arrival date & time: 10/09/22  0117     History  Chief Complaint  Patient presents with   Chest Pain    Debbie Cardenas is a 77 y.o. female.  The history is provided by the patient.  Chest Pain Pain location:  L chest Pain radiates to:  L arm Pain severity:  Moderate Timing:  Constant Progression:  Unchanged Chronicity:  New Context: at rest   Relieved by:  Nothing Worsened by:  Nothing Ineffective treatments:  None tried Associated symptoms: no cough, no fever and no shortness of breath   Risk factors: coronary artery disease   Patient with CP s/p TKR on 3/5 presents with L CP with radiation to the LUE at rest.      Past Medical History:  Diagnosis Date   Anxiety    Arthritis    knees   Closed fracture of neck of left femur ([place]) 08/15/2018   Coronary artery disease    Hyperlipidemia    Hypertension    Pneumonia      Home Medications Prior to Admission medications   Medication Sig Start Date End Date Taking? Authorizing Provider  amLODipine (NORVASC) 5 MG tablet TAKE 1 TABLET (5 MG TOTAL) BY MOUTH DAILY. 07/16/22   Martinique, Peter M, MD  aspirin 81 MG chewable tablet Chew 1 tablet (81 mg total) by mouth 2 (two) times daily. 10/03/22   Pete Pelt, PA-C  atorvastatin (LIPITOR) 10 MG tablet TAKE 1 TABLET BY MOUTH EVERY DAY 04/16/22   Martin[place], Peter M, MD  Cholecalciferol (VITAMIN D) 50 MCG (2000 UT) tablet Take 2,000 Units by mouth daily.    [provider]  hydrochlorothiazide (MICROZIDE) 12.5 MG capsule TAKE 1 CAPSULE BY MOUTH EVERY DAY 08/20/22   [place], Peter M, MD  HYDROcodone-acetaminophen (NORCO/VICODIN) 5-325 MG tablet Take 1-2 tablets by mouth every 4 (four) hours as needed for moderate pain. 10/03/22   Pete Pelt, PA-C  MAGNESIUM OXIDE PO Take 500 mg by mouth daily.    [provider]  methocarbamol (ROBAXIN) 500 MG tablet Take 1 tablet (500 mg  total) by mouth every 6 (six) hours as needed for muscle spasms. 10/03/22   Pete Pelt, PA-C  SODIUM FLUORIDE 5000 PPM 1.1 % PSTE Place 1 Application onto teeth at bedtime. 08/27/22   [provider]      Allergies    Tramadol, Ephedrine, and Ace inhibitors    Review of Systems   Review of Systems  Constitutional:  Negative for fever.  HENT:  Negative for facial swelling.   Eyes:  Negative for redness.  Respiratory:  Negative for cough and shortness of breath.   Cardiovascular:  Positive for chest pain and leg swelling.  All other systems reviewed and are negative.   Physical Exam Updated Vital Signs BP 127/71   Pulse 60   Temp 97.7 F (36.5 C)   Resp 14   Ht '5\' 5"'$  (1.651 m)   Wt 70.3 kg   SpO2 97%   BMI 25.79 kg/m  Physical Exam Vitals and nursing note reviewed.  Constitutional:      General: She is not in acute distress.    Appearance: She is well-developed.  HENT:     Head: Normocephalic and atraumatic.     Nose: Nose normal.  Eyes:     Pupils: Pupils are equal, round, and reactive to light.  Cardiovascular:  Rate and Rhythm: Tachycardia present. Rhythm irregular.     Pulses: Normal pulses.     Heart sounds: Normal heart sounds.  Pulmonary:     Effort: Pulmonary effort is normal. No respiratory distress.     Breath sounds: Normal breath sounds. No stridor.  Abdominal:     General: Bowel sounds are normal. There is no distension.     Palpations: Abdomen is soft.     Tenderness: There is no abdominal tenderness. There is no guarding or rebound.  Genitourinary:    Vagina: No vaginal discharge.  Musculoskeletal:        General: Normal range of motion.     Cervical back: Neck supple.       Legs:  Skin:    General: Skin is warm and dry.     Capillary Refill: Capillary refill takes less than 2 seconds.     Findings: No erythema or rash.  Neurological:     General: No focal deficit present.     Deep Tendon Reflexes: Reflexes normal.   Psychiatric:        Mood and Affect: Mood normal.     ED Results / Procedures / Treatments   Labs (all labs ordered are listed, but only abnormal results are displayed) Results for orders placed or performed during the hospital encounter of 10/09/22  CBC with Differential  Result Value Ref Range   WBC 8.8 4.0 - 10.5 K/uL   RBC 3.26 (L) 3.87 - 5.11 MIL/uL   Hemoglobin 10.1 (L) 12.0 - 15.0 g/dL   HCT 29.6 (L) 36.0 - 46.0 %   MCV 90.8 80.0 - 100.0 fL   MCH 31.0 26.0 - 34.0 pg   MCHC 34.1 30.0 - 36.0 g/dL   RDW 13.7 11.5 - 15.5 %   Platelets 309 150 - 400 K/uL   nRBC 0.0 0.0 - 0.2 %   Neutrophils Relative % 68 %   Neutro Abs 6.1 1.7 - 7.7 K/uL   Lymphocytes Relative 18 %   Lymphs Abs 1.6 0.7 - 4.0 K/uL   Monocytes Relative 10 %   Monocytes Absolute 0.9 0.1 - 1.0 K/uL   Eosinophils Relative 2 %   Eosinophils Absolute 0.1 0.0 - 0.5 K/uL   Basophils Relative 1 %   Basophils Absolute 0.1 0.0 - 0.1 K/uL   Immature Granulocytes 1 %   Abs Immature Granulocytes 0.07 0.00 - 0.07 K/uL  Basic metabolic panel  Result Value Ref Range   Sodium 141 135 - 145 mmol/L   Potassium 3.0 (L) 3.5 - 5.1 mmol/L   Chloride 105 98 - 111 mmol/L   CO2 24 22 - 32 mmol/L   Glucose, Bld 129 (H) 70 - 99 mg/dL   BUN 24 (H) 8 - 23 mg/dL   Creatinine, Ser 0.88 0.44 - 1.00 mg/dL   Calcium 9.1 8.9 - 10.3 mg/dL   GFR, Estimated >60 >60 mL/min   Anion gap 12 5 - 15  Troponin I (High Sensitivity)  Result Value Ref Range   Troponin I (High Sensitivity) 10 <18 ng/L   CT Angio Chest PE W and/or Wo Contrast  Result Date: 10/09/2022 CLINICAL DATA:  Syncope/presyncope, cerebrovascular cause suspected. Left-sided chest pain radiating to the left arm and palpitations EXAM: CT ANGIOGRAPHY CHEST WITH CONTRAST TECHNIQUE: Multidetector CT imaging of the chest was performed using the standard protocol during bolus administration of intravenous contrast. Multiplanar CT image reconstructions and MIPs were obtained to  evaluate the vascular anatomy. RADIATION DOSE REDUCTION: This exam  was performed according to the departmental dose-optimization program which includes automated exposure control, adjustment of the mA and/or kV according to patient size and/or use of iterative reconstruction technique. CONTRAST:  10m OMNIPAQUE IOHEXOL 350 MG/ML SOLN COMPARISON:  CT chest heart morphology 07/08/2020 and radiographs 10/09/2022 FINDINGS: Cardiovascular: Satisfactory opacification of the pulmonary arteries to the segmental level. No evidence of pulmonary embolism. Cardiomegaly. No pericardial effusion. Coronary artery and aortic atherosclerotic calcification. Sternotomy and CABG. The main pulmonary artery is mildly dilated measuring 35 mm. Mediastinum/Nodes: 9 mm right thyroid nodule. No follow-up recommended. Unremarkable esophagus. No thoracic adenopathy. Lungs/Pleura: Diffuse interlobular septal thickening with mild patchy ground-glass opacities. No pleural effusion or pneumothorax. The central airways are patent. Upper Abdomen: No acute abnormality.  Cholecystectomy. Musculoskeletal: Unchanged compression deformity of T11. No acute fracture. Review of the MIP images confirms the above findings. IMPRESSION: 1. No evidence of pulmonary embolism. 2. Diffuse interlobular septal thickening with mild patchy ground-glass opacities, favored to represent mild pulmonary edema versus atypical infection. 3. Dilated main pulmonary artery can be seen with pulmonary hypertension. Aortic Atherosclerosis (ICD10-I70.0). Electronically Signed   By: TPlacido SouM.D.   On: 10/09/2022 02:48   DG Chest Portable 1 View  Result Date: 10/09/2022 CLINICAL DATA:  Pain. EXAM: PORTABLE CHEST 1 VIEW COMPARISON:  09/21/2020. FINDINGS: The heart is enlarged and the mediastinal contour is within normal limits. No consolidation, effusion, or pneumothorax. Sternotomy wires are present over the midline. No acute osseous abnormality. IMPRESSION: Cardiomegaly  with no active disease. Electronically Signed   By: LBrett FairyM.D.   On: 10/09/2022 01:46   DG Knee Right Port  Result Date: 10/02/2022 CLINICAL DATA:  Status post right hip replacement. EXAM: PORTABLE RIGHT KNEE - 1-2 VIEW COMPARISON:  Preoperative radiograph 08/08/2022 FINDINGS: Right knee arthroplasty in expected alignment. No periprosthetic lucency or fracture. There has been patellar resurfacing. Recent postsurgical change includes air and edema in the soft tissues and joint space. Anterior skin staples in place. IMPRESSION: Right knee arthroplasty without immediate postoperative complication. Electronically Signed   By: MKeith RakeM.D.   On: 10/02/2022 15:08     EKG EKG Interpretation  Date/Time:  Tuesday October 09 2022 01:20:19 EDT Ventricular Rate:  150 PR Interval:    QRS Duration: 104 QT Interval:  323 QTC Calculation: 511 R Axis:   34 Text Interpretation: Atrial fibrillation with rapid V-rate Repolarization abnormality, prob rate related Confirmed by PRandal Buba Ziad Maye ([id number]) on 10/09/2022 1:29:52 AM  Radiology CT Angio Chest PE W and/or Wo Contrast  Result Date: 10/09/2022 CLINICAL DATA:  Syncope/presyncope, cerebrovascular cause suspected. Left-sided chest pain radiating to the left arm and palpitations EXAM: CT ANGIOGRAPHY CHEST WITH CONTRAST TECHNIQUE: Multidetector CT imaging of the chest was performed using the standard protocol during bolus administration of intravenous contrast. Multiplanar CT image reconstructions and MIPs were obtained to evaluate the vascular anatomy. RADIATION DOSE REDUCTION: This exam was performed according to the departmental dose-optimization program which includes automated exposure control, adjustment of the mA and/or kV according to patient size and/or use of iterative reconstruction technique. CONTRAST:  798mOMNIPAQUE IOHEXOL 350 MG/ML SOLN COMPARISON:  CT chest heart morphology 07/08/2020 and radiographs 10/09/2022 FINDINGS: Cardiovascular:  Satisfactory opacification of the pulmonary arteries to the segmental level. No evidence of pulmonary embolism. Cardiomegaly. No pericardial effusion. Coronary artery and aortic atherosclerotic calcification. Sternotomy and CABG. The main pulmonary artery is mildly dilated measuring 35 mm. Mediastinum/Nodes: 9 mm right thyroid nodule. No follow-up recommended. Unremarkable esophagus. No thoracic adenopathy. Lungs/Pleura: Diffuse  interlobular septal thickening with mild patchy ground-glass opacities. No pleural effusion or pneumothorax. The central airways are patent. Upper Abdomen: No acute abnormality.  Cholecystectomy. Musculoskeletal: Unchanged compression deformity of T11. No acute fracture. Review of the MIP images confirms the above findings. IMPRESSION: 1. No evidence of pulmonary embolism. 2. Diffuse interlobular septal thickening with mild patchy ground-glass opacities, favored to represent mild pulmonary edema versus atypical infection. 3. Dilated main pulmonary artery can be seen with pulmonary hypertension. Aortic Atherosclerosis (ICD10-I70.0). Electronically Signed   By: Placido Sou M.D.   On: 10/09/2022 02:48   DG Chest Portable 1 View  Result Date: 10/09/2022 CLINICAL DATA:  Pain. EXAM: PORTABLE CHEST 1 VIEW COMPARISON:  09/21/2020. FINDINGS: The heart is enlarged and the mediastinal contour is within normal limits. No consolidation, effusion, or pneumothorax. Sternotomy wires are present over the midline. No acute osseous abnormality. IMPRESSION: Cardiomegaly with no active disease. Electronically Signed   By: Brett Fairy M.D.   On: 10/09/2022 01:46    Procedures Procedures    Medications Ordered in ED Medications  diltiazem (CARDIZEM) 125 mg in dextrose 5% 125 mL (1 mg/mL) infusion (12.5 mg/hr Intravenous Rate/Dose Change 10/09/22 0421)  magnesium sulfate IVPB 2 g 50 mL (has no administration in time range)  potassium chloride SA (KLOR-CON M) CR tablet 40 mEq (has no  administration in time range)  sodium chloride 0.9 % bolus 1,000 mL (0 mLs Intravenous Stopped 10/09/22 0305)  iohexol (OMNIPAQUE) 350 MG/ML injection 75 mL (75 mLs Intravenous Contrast Given 10/09/22 0236)  furosemide (LASIX) injection 40 mg (40 mg Intravenous Given 10/09/22 0415)     ED Course/ Medical Decision Making/ A&P                             Medical Decision Making Patient with chest pain and LUW no SOB had TKR on 3/5  Amount and/or Complexity of Data Reviewed Independent Historian: spouse    Details: See above  External Data Reviewed: notes.    Details: Previous notes  reviewed  Labs: ordered.    Details: All labs reviewed: first troponin 10, normal.  Normal white count 8.8 , hemoglobin low 10.1, normal platelet count.  Normal sodium 141, potassium low 3.0, normal creatinine .12  Radiology: ordered. Discussion of management or test interpretation with external provider(s): Case d/w on call for Dr. Ninfa Linden of orthopedics.  Patient to be admitted to medicine orthop to consult   Risk Prescription drug management. Decision regarding hospitalization. Risk Details: AFIB and potassium treated in the ED.    Critical Care Total time providing critical care: 60 minutes (Diltiazem drip )   CRITICAL CARE Performed by: Rashi Granier K Carrie Schoonmaker-Rasch Total critical care time: 60  minutes Critical care time was exclusive of separately billable procedures and treating other patients. Critical care was necessary to treat or prevent imminent or life-threatening deterioration. Critical care was time spent personally by me on the following activities: development of treatment plan with patient and/or surrogate as well as nursing, discussions with consultants, evaluation of patient's response to treatment, examination of patient, obtaining history from patient or surrogate, ordering and performing treatments and interventions, ordering and review of laboratory studies, ordering and review of  radiographic studies, pulse oximetry and re-evaluation of patient's condition.   Final Clinical Impression(s) / ED Diagnoses Final diagnoses:  Atrial fibrillation, unspecified type ([place])  Chest pain, unspecified type   The patient appears reasonably stabilized for admission considering the current resources, flow,  and capabilities available in the ED at this time, and I doubt any other [facility] requiring further screening and/or treatment in the ED prior to admission.  Rx / DC Orders ED Discharge Orders     None         Lakenya Riendeau, MD 10/09/22 [id number]

## 2022-10-09 NOTE — ED Triage Notes (Signed)
Patient reports waking up at aprox 0100 with palpitations and left side CP that radiated to left arm, knee replacement on 10/03/22, she denies [facility]

## 2022-10-09 NOTE — Consult Note (Signed)
  Patient 77 year old female with recent right total knee arthroplasty 10/02/2022.  Asked to see patient in the ER where she is being observed and treated for acute chest pain and atrial fibrillation with RVR.  The patient states that she is having very little pain in the knee.  She is concerned about the fact that she is not getting therapy for her knee while in the ER.  Physical exam right knee she has full extension flexion to approximately 60 to 70 degrees.  Staples well-approximated the incision.  There is moderate amount of blood on the incision bandage but no active bleeding.  Calf supple.  She has significant ecchymosis about the knee.   Plan: Will continue to follow patient while she is hospitalized.  Once she is cleared by cardiology/medicine we can resume her physical therapy.  She is weightbearing as tolerated right lower extremity.  Right knee dressing was changed today.  Dressings should remain intact.

## 2022-10-09 NOTE — Progress Notes (Signed)
This is a 77 year old female past medical history of hyperlipidemia, hypertension, anxiety, Afib and CAD.  10/02/22 patient had a TKA.  Today patient presents to the ER with A-fib with RVR, and pulmonary edema.  Patient is a poor historian.  Currently she is on a diltiazem drip.  CTA positive pulmonary edema.  EDP secure chat Dr. Ninfa Linden orthopedic surgeon

## 2022-10-10 ENCOUNTER — Observation Stay (HOSPITAL_COMMUNITY): Payer: HMO

## 2022-10-10 ENCOUNTER — Observation Stay (HOSPITAL_BASED_OUTPATIENT_CLINIC_OR_DEPARTMENT_OTHER): Payer: HMO

## 2022-10-10 ENCOUNTER — Other Ambulatory Visit (HOSPITAL_COMMUNITY): Payer: Self-pay

## 2022-10-10 DIAGNOSIS — I4891 Unspecified atrial fibrillation: Secondary | ICD-10-CM

## 2022-10-10 DIAGNOSIS — I10 Essential (primary) hypertension: Secondary | ICD-10-CM | POA: Diagnosis not present

## 2022-10-10 DIAGNOSIS — R410 Disorientation, unspecified: Secondary | ICD-10-CM | POA: Insufficient documentation

## 2022-10-10 DIAGNOSIS — E669 Obesity, unspecified: Secondary | ICD-10-CM | POA: Insufficient documentation

## 2022-10-10 DIAGNOSIS — Z951 Presence of aortocoronary bypass graft: Secondary | ICD-10-CM

## 2022-10-10 DIAGNOSIS — R079 Chest pain, unspecified: Secondary | ICD-10-CM | POA: Diagnosis not present

## 2022-10-10 DIAGNOSIS — Z96651 Presence of right artificial knee joint: Secondary | ICD-10-CM | POA: Diagnosis not present

## 2022-10-10 LAB — CBC
HCT: 29.6 % — ABNORMAL LOW (ref 36.0–46.0)
Hemoglobin: 9.6 g/dL — ABNORMAL LOW (ref 12.0–15.0)
MCH: 30.5 pg (ref 26.0–34.0)
MCHC: 32.4 g/dL (ref 30.0–36.0)
MCV: 94 fL (ref 80.0–100.0)
Platelets: 330 10*3/uL (ref 150–400)
RBC: 3.15 MIL/uL — ABNORMAL LOW (ref 3.87–5.11)
RDW: 13.9 % (ref 11.5–15.5)
WBC: 8.8 10*3/uL (ref 4.0–10.5)
nRBC: 0 % (ref 0.0–0.2)

## 2022-10-10 LAB — IRON AND TIBC
Iron: 52 ug/dL (ref 28–170)
Saturation Ratios: 22 % (ref 10.4–31.8)
TIBC: 241 ug/dL — ABNORMAL LOW (ref 250–450)
UIBC: 189 ug/dL

## 2022-10-10 LAB — ECHOCARDIOGRAM COMPLETE
AR max vel: 2.95 cm2
AV Area VTI: 2.7 cm2
AV Area mean vel: 2.86 cm2
AV Mean grad: 7 mmHg
AV Peak grad: 12.8 mmHg
Ao pk vel: 1.79 m/s
Area-P 1/2: 3.27 cm2
Height: 65 in
S' Lateral: 2.7 cm
Weight: 2462.1 oz

## 2022-10-10 LAB — BASIC METABOLIC PANEL
Anion gap: 9 (ref 5–15)
BUN: 18 mg/dL (ref 8–23)
CO2: 24 mmol/L (ref 22–32)
Calcium: 8.9 mg/dL (ref 8.9–10.3)
Chloride: 105 mmol/L (ref 98–111)
Creatinine, Ser: 0.78 mg/dL (ref 0.44–1.00)
GFR, Estimated: >60 mL/min (ref >60)
Glucose, Bld: 102 mg/dL — ABNORMAL HIGH (ref 70–99)
Potassium: 3.5 mmol/L (ref 3.5–5.1)
Sodium: 138 mmol/L (ref 135–145)

## 2022-10-10 LAB — RETICULOCYTES
Immature Retic Fract: 28.7 % — ABNORMAL HIGH (ref 2.3–15.9)
RBC.: 3.3 MIL/uL — ABNORMAL LOW (ref 3.87–5.11)
Retic Count, Absolute: 115.5 10*3/uL (ref 19.0–186.0)
Retic Ct Pct: 3.5 % — ABNORMAL HIGH (ref 0.4–3.1)

## 2022-10-10 LAB — FERRITIN: Ferritin: 231 ng/mL (ref 11–307)

## 2022-10-10 LAB — AMMONIA: Ammonia: 21 umol/L (ref 9–35)

## 2022-10-10 LAB — TSH: TSH: 0.326 u[IU]/mL — ABNORMAL LOW (ref 0.350–4.500)

## 2022-10-10 LAB — T4, FREE: Free T4: 1.24 ng/dL — ABNORMAL HIGH (ref 0.61–1.12)

## 2022-10-10 LAB — VITAMIN B12: Vitamin B-12: 218 pg/mL (ref 180–914)

## 2022-10-10 LAB — MAGNESIUM: Magnesium: 2.1 mg/dL (ref 1.7–2.4)

## 2022-10-10 LAB — FOLATE: Folate: 21.7 ng/mL (ref >5.9)

## 2022-10-10 MED ORDER — CYANOCOBALAMIN 1000 MCG/ML IJ SOLN
1000.0000 ug | Freq: Every day | INTRAMUSCULAR | Status: DC
  Administered 2022-10-11: 1000 ug via INTRAMUSCULAR
  Filled 2022-10-10: qty 1

## 2022-10-10 MED ORDER — VITAMIN B-12 1000 MCG PO TABS: 1000.0000 ug | ORAL_TABLET | Freq: Every day | ORAL | Status: DC

## 2022-10-10 MED ORDER — POTASSIUM CHLORIDE CRYS ER 20 MEQ PO TBCR
40.0000 meq | EXTENDED_RELEASE_TABLET | Freq: Once | ORAL | Status: AC
  Administered 2022-10-10: 40 meq via ORAL
  Filled 2022-10-10: qty 2

## 2022-10-10 MED ORDER — ACETAMINOPHEN 325 MG PO TABS
650.0000 mg | ORAL_TABLET | Freq: Four times a day (QID) | ORAL | Status: DC
  Administered 2022-10-10 – 2022-10-11 (×5): 650 mg via ORAL
  Filled 2022-10-10 (×5): qty 2

## 2022-10-10 NOTE — Progress Notes (Signed)
Rounding Note    Patient Name: Debbie Cardenas Date of Encounter: 10/10/2022  [place] Cardiologist: Peter Martinique, MD   Subjective   Patient is very confused this morning. She does not know where she is and cannot even tell me what states she lives in. She keeps asking where her family is. Per note from Dr. Fuller Plan last night at 6:21pm, patient's son reported that patient had been actively hallucinating (seeing doors that were not there) and stating that he was trying to get her killed. Seems like confusion started after recent knee surgery. No head imaging has been done yet. She denies any cardiac complaints this morning. No chest pain, shortness of breath, or palpitations.   Inpatient Medications    Scheduled Meds:  acetaminophen  650 mg Oral Q6H WA   amLODipine  5 mg Oral Daily   apixaban  5 mg Oral BID   atorvastatin  10 mg Oral Daily   metoprolol tartrate  12.5 mg Oral BID   potassium chloride  40 mEq Oral Once   sodium chloride flush  3 mL Intravenous Q12H   Continuous Infusions:  PRN Meds: albuterol, ondansetron **OR** ondansetron (ZOFRAN) IV   Vital Signs    Vitals:   10/10/22 0226 10/10/22 0400 10/10/22 0527 10/10/22 0827  BP: (!) 150/55  (!) 148/55 (!) 129/51  Pulse: 74  69 73  Resp: '16  16 17  '$ Temp: 98 F (36.7 C)  98.3 F (36.8 C) 98.3 F (36.8 C)  TempSrc: Oral  Oral Oral  SpO2: 100%  98% 99%  Weight:  96.4 kg    Height:  '5\' 5"'$  (1.651 m)      Intake/Output Summary (Last 24 hours) at 10/10/2022 0845 Last data filed at 10/10/2022 0417 Gross per 24 hour  Intake 72.42 ml  Output --  Net 72.42 ml      10/10/2022    4:00 AM 10/09/2022    1:27 AM 10/02/2022   10:08 AM  Last 3 Weights  Weight (lbs) 212 lb 8.4 oz 155 lb 155 lb  Weight (kg) 96.4 kg 70.308 kg 70.308 kg      Telemetry    Sinus rhythm with rates in the 60s to 70s.  - Personally Reviewed  ECG    No new ECG tracing today.  - Personally Reviewed  Physical Exam    GEN: No acute distress.   Neck: No JVD. Cardiac: RRR. No murmurs, rubs, or gallops.  Respiratory: Clear to auscultation bilaterally. No wheezing, ronchi, or rales. GI: Soft, non-distended, and non-tender.  MS: Edema of right knee after recent surgery. No deformity. Skin: Warm and dry. Neuro:  Very confused. No facial asymmetry. No gross neuro deficits.  Psych: Very confused.  Labs    High Sensitivity Troponin:   Recent Labs  Lab 10/09/22 0125 10/09/22 0510  TROPONINIHS 10 58*     Chemistry Recent Labs  Lab 10/09/22 0125 10/10/22 0105  NA 141 138  K 3.0* 3.5  CL 105 105  CO2 24 24  GLUCOSE 129* 102*  BUN 24* 18  CREATININE 0.88 0.78  CALCIUM 9.1 8.9  MG  --  2.1  GFRNONAA >60 >60  ANIONGAP 12 9    Lipids  Recent Labs  Lab 10/09/22 0125  CHOL 144  TRIG 84  HDL 44  LDLCALC 83  CHOLHDL 3.3    Hematology Recent Labs  Lab 10/09/22 0125 10/10/22 0105  WBC 8.8 8.8  RBC 3.26* 3.15*  HGB 10.1* 9.6*  HCT 29.6* 29.6*  MCV 90.8 94.0  MCH 31.0 30.5  MCHC 34.1 32.4  RDW 13.7 13.9  PLT 309 330   Thyroid  Recent Labs  Lab 10/09/22 0125  TSH 0.591    BNP Recent Labs  Lab 10/09/22 0125  BNP 185.5*    DDimer No results for input(s): "DDIMER" in the last 168 hours.   Radiology    CT Angio Chest PE W and/or Wo Contrast  Result Date: 10/09/2022 CLINICAL DATA:  Syncope/presyncope, cerebrovascular cause suspected. Left-sided chest pain radiating to the left arm and palpitations EXAM: CT ANGIOGRAPHY CHEST WITH CONTRAST TECHNIQUE: Multidetector CT imaging of the chest was performed using the standard protocol during bolus administration of intravenous contrast. Multiplanar CT image reconstructions and MIPs were obtained to evaluate the vascular anatomy. RADIATION DOSE REDUCTION: This exam was performed according to the departmental dose-optimization program which includes automated exposure control, adjustment of the mA and/or kV according to patient size  and/or use of iterative reconstruction technique. CONTRAST:  36m OMNIPAQUE IOHEXOL 350 MG/ML SOLN COMPARISON:  CT chest heart morphology 07/08/2020 and radiographs 10/09/2022 FINDINGS: Cardiovascular: Satisfactory opacification of the pulmonary arteries to the segmental level. No evidence of pulmonary embolism. Cardiomegaly. No pericardial effusion. Coronary artery and aortic atherosclerotic calcification. Sternotomy and CABG. The main pulmonary artery is mildly dilated measuring 35 mm. Mediastinum/Nodes: 9 mm right thyroid nodule. No follow-up recommended. Unremarkable esophagus. No thoracic adenopathy. Lungs/Pleura: Diffuse interlobular septal thickening with mild patchy ground-glass opacities. No pleural effusion or pneumothorax. The central airways are patent. Upper Abdomen: No acute abnormality.  Cholecystectomy. Musculoskeletal: Unchanged compression deformity of T11. No acute fracture. Review of the MIP images confirms the above findings. IMPRESSION: 1. No evidence of pulmonary embolism. 2. Diffuse interlobular septal thickening with mild patchy ground-glass opacities, favored to represent mild pulmonary edema versus atypical infection. 3. Dilated main pulmonary artery can be seen with pulmonary hypertension. Aortic Atherosclerosis (ICD10-I70.0). Electronically Signed   By: TPlacido SouM.D.   On: 10/09/2022 02:48   DG Chest Portable 1 View  Result Date: 10/09/2022 CLINICAL DATA:  Pain. EXAM: PORTABLE CHEST 1 VIEW COMPARISON:  09/21/2020. FINDINGS: The heart is enlarged and the mediastinal contour is within normal limits. No consolidation, effusion, or pneumothorax. Sternotomy wires are present over the midline. No acute osseous abnormality. IMPRESSION: Cardiomegaly with no active disease. Electronically Signed   By: LBrett FairyM.D.   On: 10/09/2022 01:46    Cardiac Studies   Left Cardiac Catheterization 07/27/2020: Mid LM to Dist LM lesion is 70% stenosed. Ost LAD to Prox LAD lesion is 50%  stenosed. Prox LAD to Mid LAD lesion is 70% stenosed. 1st Mrg lesion is 50% stenosed. Dist Cx lesion is 30% stenosed with 30% stenosed side branch in LPAV. Ost RCA to Prox RCA lesion is 90% stenosed. The left ventricular systolic function is normal. LV end diastolic pressure is normal. The left ventricular ejection fraction is 55-65% by visual estimate.   1. 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 Normal LV function 3. Normal LVEDP   Plan: patient has high risk anatomy that is not suitable for PCI. Recommend CT surgery evaluation for CABG.   _______________  Intraoperative TEE 08/15/2020: Post-Op Impressions: - Left Ventricle: The left ventricle is unchanged from pre-bypass. has  normal  systolic function, with an ejection fraction of 65%. The cavity size was  normal.  The wall motion is normal.  - Right Ventricle: The right ventricle  appears unchanged from pre-bypass.  - Aorta: The aorta appears unchanged from pre-bypass.  - Left Atrium: The left atrium appears unchanged from pre-bypass.  - Left Atrial Appendage: The left atrial appendage appears unchanged from  pre-bypass.  - Aortic Valve: The aortic valve appears unchanged from pre-bypass.  - Mitral Valve: The mitral valve appears unchanged from pre-bypass.  - Tricuspid Valve: There is Trivial regurgitation.  - Interatrial Septum: The interatrial septum appears unchanged from  pre-bypass.  - Interventricular Septum: The interventricular septum appears unchanged  from  pre-bypass.  - Pericardium: The pericardium appears unchanged from pre-bypass.    Patient Profile     77 y.o. female with a history of CAD s/p CABG in 07/2020, postop atrial fibrillation following CABG no longer on Amiodarone or anticoagulation, hypertension, hyperlipidemia, and arthritis admitted on 10/09/2022 for atrial fibrillation with RVR after presenting with chest pain and palpitations.  Assessment &  Plan    Atrial Fibrillation with RVR History of Post-Op Atrial Fibrillation Patient has a history of postop atrial fibrillation following CABG in 07/2020.  She was initially started on amiodarone and Eliquis but these were able to be stopped due to no recurrence.  She now presents with chest pain and palpitations and was found to be in atrial fibrillation with RVR.  Of note she recently had knee surgery last week. Potassium was mildly low and this was repleted. Magnesium and TSH were within normal limits.  She was started on IV Diltiazem and thankfully converted back to sinus rhythm shortly after. - Maintaining sinus rhythm.  - Echo pending. - Continue Lopressor 12.'5mg'$  twice daily.  - CHA2DS2-VASc = 5 (CAD, HTN, age x2, female). Given recurrence of atrial fibrillation, she will need chronic anticoagulation and was started back on Eliquis '5mg'$  twice daily. Continue.  Demand Ischemica CAD s/p CABG History of CAD with CABG x4 in 07/2020. She presented with chest pain in the setting of atrial fibrillation. High-sensitivity troponin 10 >> 58.  - No recurrent chest pain with restoration of sinus rhythm.  - Aspirin stopped with initiation of Eliquis. - Continue statin. - Chest pain and mild troponin elevation felt to be due to demand ischemia from rapid atrial fibrillation. Echo pending but no ischemic evaluation planned at this time. If she has recurrent chest pain while in sinus rhythm, can reconsider.   Elevated BNP BNP was mildly elevated at 185. Chest x-ray showed cardiomegaly but no active disease. Chest CTA showed some diffuse interlobular septal thickening with mild patchy ground glass opacities felt to favor mild pulmonary edema. She was given one dose of IV Lasix in the ED with good urine output. - She has some edema of right knee after recent knee surgery but otherwise euvolemic.  - No need for additional diuretics as this time. - Echo pending.  Hypertension BP mildly elevated. - Continue  Amlodipine '5mg'$  daily. - Continue Lopressor 12.'5mg'$  twice daily.  - Patient is very confused this morning so wonder if mildly elevated BP may be partly due to some agitation. Would continue current medications for now and adjust as needed.   Hyperlipidemia - Continue home Lipitor '10mg'$  daily.  Altered Mental Status Patient is very confused this morning. It sounds like she has had some confusion since her knee surgery last week but got worse yesterday. Son told primary team yesterday that she was hallucinating.  - No facial asymmetry or gross neuro deficits.  - Discussed with RN and asked her to reach out to primary team to see earlier rather  than later. She may need head CT but will defer to primary team.   For questions or updates, please contact [place] Please consult [email or web address] for contact info under        Signed, Darreld Mclean, PA-C  10/10/2022, 8:45 AM

## 2022-10-10 NOTE — Evaluation (Signed)
Physical Therapy Evaluation Patient Details Name: Debbie Cardenas MRN: [id number] DOB: 01/17/1946 Today's Date: 10/10/2022  History of Present Illness  77 y.o. female presents to [facility] hospital on 10/02/2022 for elective R TKA. PMH includes CABG x 4, HTN, HLD, OA, anxiety.  patient in the ER where she is being observed and treated for acute chest pain and atrial fibrillation with RVR  Clinical Impression  Pt presents with admitting diagnosis above. Pt tolerated treatment well today. Per RN pt had some confusion and hallucinations overnight however pt was A&O x4 during session. Pt was able to ambulate in hallway with RW at supervision level however did require min guard for navigating obstacles. Pt moves fairly well and is anticipating discharge either today or tomorrow. Recommend that patient resume OPPT once discharged. Pt would benefit from continued functional mobility during acute stay to improve knee ROM.     Recommendations for follow up therapy are one component of a multi-disciplinary discharge planning process, led by the attending physician.  Recommendations may be updated based on patient status, additional functional criteria and insurance authorization.  Follow Up Recommendations Outpatient PT (Resume OPPT)      Assistance Recommended at Discharge Intermittent Supervision/Assistance  Patient can return home with the following  A little help with walking and/or transfers;A little help with bathing/dressing/bathroom;Assistance with cooking/housework;Assist for transportation;Help with stairs or ramp for entrance    Equipment Recommendations None recommended by PT (Has needed DME)  Recommendations for Other Services       Functional Status Assessment Patient has had a recent decline in their functional status and demonstrates the ability to make significant improvements in function in a reasonable and predictable amount of time.     Precautions / Restrictions  Precautions Precautions: Knee;Fall Precaution Booklet Issued: No Restrictions Weight Bearing Restrictions: Yes RLE Weight Bearing: Weight bearing as tolerated      Mobility  Bed Mobility Overal bed mobility: Modified Independent       Supine to sit: Modified independent (Device/Increase time), HOB elevated          Transfers Overall transfer level: Needs assistance Equipment used: Rolling walker (2 wheels) Transfers: Sit to/from Stand Sit to Stand: Min guard           General transfer comment: Min guard for safety. Pt required cues for hand placement    Ambulation/Gait Ambulation/Gait assistance: Supervision, Min guard Gait Distance (Feet): 350 Feet Assistive device: Rolling walker (2 wheels) Gait Pattern/deviations: Step-through pattern, Drifts right/left, Decreased stance time - left Gait velocity: decreased     General Gait Details: Pt was overall supervision however required min guard and verbal cues for navigating obstacles  Stairs            Wheelchair Mobility    Modified Rankin (Stroke Patients Only)       Balance Overall balance assessment: Mild deficits observed, not formally tested                                           Pertinent Vitals/Pain Pain Assessment Pain Assessment: No/denies pain    Home Living Family/patient expects to be discharged to:: Private residence Living Arrangements: Spouse/significant other Available Help at Discharge: Family;Available 24 hours/day Type of Home: House Home Access: Stairs to enter Entrance Stairs-Rails: Can reach both Entrance Stairs-Number of Steps: 5   Home Layout: One level Home Equipment: Conservation officer, nature (2 wheels);Cane - single  point;BSC/3in1      Prior Function Prior Level of Function : Independent/Modified Independent;Driving             Mobility Comments: Ind ADLs Comments: Ind     Hand Dominance   Dominant Hand: Left    Extremity/Trunk Assessment    Upper Extremity Assessment Upper Extremity Assessment: Generalized weakness    Lower Extremity Assessment Lower Extremity Assessment: Defer to PT evaluation    Cervical / Trunk Assessment Cervical / Trunk Assessment: Normal  Communication   Communication: No difficulties  Cognition Arousal/Alertness: Awake/alert Behavior During Therapy: WFL for tasks assessed/performed Overall Cognitive Status: Within Functional Limits for tasks assessed                                 General Comments: Per RN pt had been confused overnight and into AM however pt was A&O x4 during session.        General Comments General comments (skin integrity, edema, etc.): VSS on RA. Pt in NAD    Exercises     Assessment/Plan    PT Assessment    PT Problem List         PT Treatment Interventions DME instruction;Gait training;Stair training;Functional mobility training;Therapeutic activities;Therapeutic exercise;Balance training;Neuromuscular re-education;Patient/family education    PT Goals (Current goals can be found in the Care Plan section)  Acute Rehab PT Goals Patient Stated Goal: to go home, return to independence PT Goal Formulation: With patient Time For Goal Achievement: 10/24/22 Potential to Achieve Goals: Good    Frequency Min 3X/week     Co-evaluation               AM-PAC PT "6 Clicks" Mobility  Outcome Measure Help needed turning from your back to your side while in a flat bed without using bedrails?: None Help needed moving from lying on your back to sitting on the side of a flat bed without using bedrails?: None Help needed moving to and from a bed to a chair (including a wheelchair)?: A Little Help needed standing up from a chair using your arms (e.g., wheelchair or bedside chair)?: A Little Help needed to walk in hospital room?: A Little Help needed climbing 3-5 steps with a railing? : A Lot 6 Click Score: 19    End of Session Equipment Utilized  During Treatment: Gait belt Activity Tolerance: Patient tolerated treatment well Patient left: in chair;with call bell/phone within reach;with chair alarm set;with family/visitor present Nurse Communication: Mobility status      Time: [id number] PT Time Calculation (min) (ACUTE ONLY): 31 min   Charges:   PT Evaluation $PT Eval Low Complexity: 1 Low PT Treatments $Gait Training: 8-22 mins $Therapeutic Activity: 8-22 mins        Shelby Mattocks, PT, DPT Acute Rehab Services [id number]   Viann Shove 10/10/2022, 12:42 PM

## 2022-10-10 NOTE — Discharge Instructions (Signed)

## 2022-10-10 NOTE — Progress Notes (Signed)
Echocardiogram 2D Echocardiogram has been performed.  [name] 10/10/2022, 3:36 PM

## 2022-10-10 NOTE — Evaluation (Addendum)
Occupational Therapy Evaluation Patient Details Name: Debbie Cardenas MRN: [id number] DOB: June 01, 1946 Today's Date: 10/10/2022   History of Present Illness 77 y.o. female presents to [facility] hospital on 10/02/2022 for elective R TKA. PMH includes CABG x 4, HTN, HLD, OA, anxiety.  patient in the ER where she is being observed and treated for acute chest pain and atrial fibrillation with RVR   Clinical Impression   Pt presents with decline in function and safety with ADLs and ADL mobility with impaired strength, endurance, balance and cognition (impaired safety awareness, confusion). PTA pt lived at home with her husband and was Ind with ADLs/selfcare, home mgt and was driving. Pt currently requires min A with LB ADLs and min guard A with mobility using RW; pt with some confusion and requiring verbal and physical cues for sequencing and safety. Pt will benefit from acute OT services to address impairments to maximize level of function and safety     Recommendations for follow up therapy are one component of a multi-disciplinary discharge planning process, led by the attending physician.  Recommendations may be updated based on patient status, additional functional criteria and insurance authorization.   Follow Up Recommendations  No Follow up OT    Assistance Recommended at Discharge Frequent or constant Supervision/Assistance  Patient can return home with the following A little help with bathing/dressing/bathroom;A little help with walking and/or transfers;Help with stairs or ramp for entrance;Assist for transportation;Direct supervision/assist for medications management    Functional Status Assessment  Patient has had a recent decline in their functional status and demonstrates the ability to make significant improvements in function in a reasonable and predictable amount of time.  Equipment Recommendations  None recommended by OT    Recommendations for Other Services       Precautions /  Restrictions Precautions Precautions: Knee;Fall Precaution Booklet Issued: No Restrictions Weight Bearing Restrictions: Yes RLE Weight Bearing: Weight bearing as tolerated      Mobility Bed Mobility               General bed mobility comments: pt in recliner upon arrival    Transfers Overall transfer level: Needs assistance Equipment used: Rolling walker (2 wheels) Transfers: Sit to/from Stand Sit to Stand: Min guard           General transfer comment: Min guard for safety. Pt required cues for hand placement and to use RW correctly      Balance Overall balance assessment: Needs assistance Sitting-balance support: Feet supported, No upper extremity supported       Standing balance support: Bilateral upper extremity supported, During functional activity Standing balance-Leahy Scale: Poor                             ADL either performed or assessed with clinical judgement   ADL Overall ADL's : Needs assistance/impaired Eating/Feeding: Independent   Grooming: Wash/dry hands;Wash/dry face;Min guard;Standing   Upper Body Bathing: Set up;Supervision/ safety;Sitting   Lower Body Bathing: Minimal assistance   Upper Body Dressing : Set up;Supervision/safety;Sitting   Lower Body Dressing: Minimal assistance   Toilet Transfer: Min guard;Ambulation;Rolling walker (2 wheels);Cueing for sequencing;Cueing for safety;Regular Toilet;Grab bars   Toileting- Clothing Manipulation and Hygiene: Minimal assistance;Sit to/from stand       Functional mobility during ADLs: Min guard;Cueing for safety;Cueing for sequencing;Rolling walker (2 wheels)       Vision Ability to See in Adequate Light: 0 Adequate Patient Visual Report: No change from  baseline       Perception     Praxis      Pertinent Vitals/Pain Pain Assessment Pain Assessment: No/denies pain Pain Score: 0-No pain Pain Intervention(s): Monitored during session, Repositioned     Hand  Dominance Left   Extremity/Trunk Assessment Upper Extremity Assessment Upper Extremity Assessment: Generalized weakness   Lower Extremity Assessment Lower Extremity Assessment: Defer to PT evaluation   Cervical / Trunk Assessment Cervical / Trunk Assessment: Normal   Communication Communication Communication: No difficulties   Cognition Arousal/Alertness: Awake/alert Behavior During Therapy: WFL for tasks assessed/performed Overall Cognitive Status: Impaired/Different from baseline                                 General Comments: pt with some confusion and required cues for sequencing and safety for using RW and to follow 2 step instructions correctly     General Comments  VSS on RA. Pt in NAD    Exercises     Shoulder Instructions      Home Living Family/patient expects to be discharged to:: Private residence Living Arrangements: Spouse/significant other Available Help at Discharge: Family;Available 24 hours/day Type of Home: House Home Access: Stairs to enter [employer] of Steps: 5 Entrance Stairs-Rails: Can reach both Home Layout: One level     Bathroom Shower/Tub: Occupational psychologist: Standard Bathroom Accessibility: Yes   Home Equipment: Conservation officer, nature (2 wheels);Cane - single point;BSC/3in1          Prior Functioning/Environment Prior Level of Function : Independent/Modified Independent;Driving             Mobility Comments: Ind ADLs Comments: Ind        OT Problem List: Decreased strength;Decreased activity tolerance;Decreased cognition;Impaired balance (sitting and/or standing);Decreased coordination      OT Treatment/Interventions: Self-care/ADL training;Therapeutic exercise;DME and/or AE instruction;Therapeutic activities;Patient/family education;Balance training    OT Goals(Current goals can be found in the care plan section) Acute Rehab OT Goals Patient Stated Goal: go home OT Goal Formulation:  With patient/family Time For Goal Achievement: 10/24/22 Potential to Achieve Goals: Good ADL Goals Pt Will Perform Grooming: with set-up;with modified independence Pt Will Perform Upper Body Bathing: with set-up;with modified independence Pt Will Perform Lower Body Bathing: with min guard assist;with supervision;with caregiver independent in assisting Pt Will Perform Upper Body Dressing: with set-up;with modified independence Pt Will Perform Lower Body Dressing: with min guard assist;with supervision;with caregiver independent in assisting Pt Will Transfer to Toilet: with supervision;with modified independence;ambulating Pt Will Perform Toileting - Clothing Manipulation and hygiene: with supervision;with modified independence;with caregiver independent in assisting;sit to/from stand Pt Will Perform Tub/Shower Transfer: with min guard assist;with supervision;rolling walker;shower seat;3 in 1;with caregiver independent in assisting  OT Frequency: Min 2X/week    Co-evaluation              AM-PAC OT "6 Clicks" Daily Activity     Outcome Measure Help from another person eating meals?: None Help from another person taking care of personal grooming?: A Little Help from another person toileting, which includes using toliet, bedpan, or urinal?: A Little Help from another person bathing (including washing, rinsing, drying)?: A Little Help from another person to put on and taking off regular upper body clothing?: A Little Help from another person to put on and taking off regular lower body clothing?: A Little 6 Click Score: 19   End of Session Equipment Utilized During Treatment: Gait belt;Rolling walker (  2 wheels)  Activity Tolerance: Patient tolerated treatment well Patient left: in chair;with call bell/phone within reach;with chair alarm set;with family/visitor present;with nursing/sitter in room  OT Visit Diagnosis: Unsteadiness on feet (R26.81);Other abnormalities of gait and mobility  (R26.89);Muscle weakness (generalized) (M62.81);Pain;Other symptoms and signs involving cognitive function                Time: 1031-1059 OT Time Calculation (min): 28 min Charges:  OT General Charges $OT Visit: 1 Visit OT Evaluation $OT Eval Moderate Complexity: 1 Mod OT Treatments $Therapeutic Activity: 8-22 mins   Britt Bottom 10/10/2022, 12:35 PM

## 2022-10-10 NOTE — TOC Initial Note (Signed)
Transition of Care [facility]) - Initial/Assessment Note    Patient Details  Name: Debbie Cardenas MRN: [id number] Date of Birth: 03/22/1946  Transition of Care [facility]) CM/SW Contact:    Bethena Roys, RN Phone Number: 10/10/2022, 3:21 PM  Clinical Narrative: Patient presented for chest pain. PTA patient was from home with spouse. Patient is currently active with [facility] for home health physical therapy. Case Manager spoke with [facility] and they will resume services-if patient remains observation no resumption orders needed. Case Manager spoke with spouse and patient has DME rolling walker and bedside commode. Spouse will provide transportation home. No further needs identified at this time.                  Expected Discharge Plan: [place] Barriers to Discharge: Continued Medical Work up   Patient Goals and CMS Choice Patient states their goals for this hospitalization and ongoing recovery are:: to return home.   Expected Discharge Plan and Services In-house Referral: NA Discharge Planning Services: CM Consult Post Acute Care Choice: Home Health, Resumption of Svcs/PTA Provider Living arrangements for the past 2 months: Single Family Home                   DME Agency: NA   HH Arranged: PT HH Agency: [place] Date [place]: 10/10/22 Time [place]: 1520 Representative spoke with at [place]: [place] Arrangements/Services Living arrangements for the past 2 months: [place] with:: Spouse Patient language and need for interpreter reviewed:: Yes Do you feel safe going back to the place where you live?: Yes      Need for Family Participation in Patient Care: Yes (Comment) Care giver support system in place?: Yes (comment) Current home services: DME (rolling walker and bedside commode.) Criminal Activity/Legal Involvement Pertinent to Current Situation/Hospitalization: No -  Comment as needed  Activities of Daily Living Home Assistive Devices/Equipment: Dentures (specify type) ADL Screening (condition at time of admission) Patient's cognitive ability adequate to safely complete daily activities?: Yes Is the patient deaf or have difficulty hearing?: No Does the patient have difficulty seeing, even when wearing glasses/contacts?: No Does the patient have difficulty concentrating, remembering, or making decisions?: No Patient able to express need for assistance with ADLs?: Yes Does the patient have difficulty dressing or bathing?: No Independently performs ADLs?: Yes (appropriate for developmental age) Does the patient have difficulty walking or climbing stairs?: No Weakness of Legs: Right Weakness of Arms/Hands: None  Permission Sought/Granted Permission sought to share information with : Family Supports, Customer service manager, Case Production designer, theatre/television/film granted to share info w AGENCY: [place]        Emotional Assessment Appearance:: Appears stated age     Orientation: : Oriented to Self Alcohol / Substance Use: Not Applicable Psych Involvement: No (comment)  Admission diagnosis:  Hypokalemia [E87.6] Acute pulmonary edema ([place]) [J81.0] Atrial fibrillation with RVR ([place]) [I48.91] Chest pain [R07.9] Atrial fibrillation, unspecified type ([place]) [I48.91] Chest pain, unspecified type [R07.9] Patient Active Problem List   Diagnosis Date Noted   Delirium 10/10/2022   Obesity (BMI 30-39.9) 10/10/2022   Atrial fibrillation with RVR ([place]) 10/09/2022   Chest pain 10/09/2022   Elevated troponin 10/09/2022   Pulmonary edema 10/09/2022   Hypokalemia 10/09/2022   Normocytic anemia 10/09/2022   Memory loss 10/09/2022   Status post total right knee replacement 10/02/2022   S/P CABG  x 4 08/15/2020   Coronary artery disease 08/15/2020   Coronary artery 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  ([place]) 07/27/2020   HTN (hypertension) 07/27/2020   Hyperlipidemia 07/27/2020   Unilateral primary osteoarthritis, right knee 12/30/2019   Unilateral primary osteoarthritis, left knee 03/26/2019   Status post total replacement of left hip 08/28/2018   Hip fracture ([place]) 08/15/2018   Closed fracture of neck of left femur ([place]) 08/15/2018   PCP:  Harrison Mons, PA Pharmacy:   [id number] [place] [place]Phone: [phone]: [phone] [facility] [address]Phone: [phone]: [phone]    Social Determinants of Health (SDOH) Social History: SDOH Screenings   Food Insecurity: No Food Insecurity (10/09/2022)  Housing: Low Risk  (10/09/2022)  Transportation Needs: No Transportation Needs (10/09/2022)  Utilities: Not At Risk (10/09/2022)  Depression (PHQ2-9): Low Risk  (11/15/2020)  Tobacco Use: Medium Risk (10/04/2022)   SDOH Interventions:     Readmission Risk Interventions     No data to display

## 2022-10-10 NOTE — ED Notes (Signed)
ED TO INPATIENT HANDOFF REPORT  ED Nurse Name and Phone #: Gregary Signs [phone]  S Name/Age/Gender Debbie Cardenas 77 y.o. female Room/Bed: 040C/040C  Code Status   Code Status: Full Code  Home/SNF/Other Home Patient oriented to: self Is this baseline? No   Triage Complete: Triage complete  Chief Complaint Atrial fibrillation with RVR ([place]) [I48.91] Chest pain [R07.9]  Triage Note Patient reports waking up at aprox 0100 with palpitations and left side CP that radiated to left arm, knee replacement on 10/03/22, she denies [facility]   Allergies Allergies  Allergen Reactions   Tramadol Itching    Pt itches the day after taking but can tolerate it   Ephedrine     "My dentist told me to never let anyone give me this"   Ace Inhibitors Cough    Level of Care/Admitting Diagnosis ED Disposition     ED Disposition  Admit   Condition  --   [place]: [place] [[id number]]  Level of Care: Telemetry Medical [104]  May place patient in observation at [facility] or [place] if equivalent level of care is available:: No  Covid Evaluation: Asymptomatic - no recent exposure (last 10 days) testing not required  Diagnosis: Chest pain [id number]  Admitting Physician: Norval Morton [id number]  Attending Physician: Norval Morton [id number]          B Medical/Surgery History Past Medical History:  Diagnosis Date   Anxiety    Arthritis    knees   Closed fracture of neck of left femur ([place]) 08/15/2018   Coronary artery disease    Hyperlipidemia    Hypertension    Pneumonia    Past Surgical History:  Procedure Laterality Date   APPENDECTOMY     CARDIAC CATHETERIZATION     CATARACT EXTRACTION, BILATERAL     CHOLECYSTECTOMY     CORONARY ARTERY BYPASS GRAFT N/A 08/15/2020   Procedure: CORONARY ARTERY BYPASS GRAFTING (CABG)X4. USING BILATERAL MAMMARY ARTERIES AND RIGHT GREATER SAPHENOUS VEIN HARVESTED ENDOSCOPICALLY.;  Surgeon: Gaye Pollack, MD;   Location: [place] OR;  Service: Open Heart Surgery;  Laterality: N/A;   EYE SURGERY     bilateral cataract removal   KNEE SURGERY     LEFT HEART CATH AND CORONARY ANGIOGRAPHY N/A 07/27/2020   Procedure: LEFT HEART CATH AND CORONARY ANGIOGRAPHY;  Surgeon: Martinique, Peter M, MD;  Location: [facility] CV LAB;  Service: Cardiovascular;  Laterality: N/A;   TEE WITHOUT CARDIOVERSION N/A 08/15/2020   Procedure: TRANSESOPHAGEAL ECHOCARDIOGRAM (TEE);  Surgeon: Gaye Pollack, MD;  Location: [facility];  Service: Open Heart Surgery;  Laterality: N/A;   TOTAL HIP ARTHROPLASTY Left 08/15/2018   Procedure: TOTAL HIP ARTHROPLASTY ANTERIOR APPROACH;  Surgeon: Mcarthur Rossetti, MD;  Location: [facility];  Service: Orthopedics;  Laterality: Left;   TOTAL KNEE ARTHROPLASTY Right 10/02/2022   Procedure: RIGHT TOTAL KNEE ARTHROPLASTY;  Surgeon: Mc[place]ur Rossetti, MD;  Location: [facility];  Service: Orthopedics;  Laterality: Right;     A IV Location/Drains/Wounds Patient Lines/Drains/Airways Status     Active Line/Drains/Airways     Name Placement date Placement time Site Days   Peripheral IV 10/09/22 20 G 1" Left Antecubital 10/09/22  0040  Antecubital  1   Peripheral IV 10/09/22 18 G 1" Right Antecubital 10/09/22  0155  Antecubital  1            Intake/Output Last 24 hours  Intake/Output Summary (Last 24 hours) at 10/10/2022 [id number] Last data  filed at 10/09/2022 0645 Gross per 24 hour  Intake 1050 ml  Output 1000 ml  Net 50 ml    Labs/Imaging Results for orders placed or performed during the hospital encounter of 10/09/22 (from the past 48 hour(s))  CBC with Differential     Status: Abnormal   Collection Time: 10/09/22  1:25 AM  Result Value Ref Range   WBC 8.8 4.0 - 10.5 K/uL   RBC 3.26 (L) 3.87 - 5.11 MIL/uL   Hemoglobin 10.1 (L) 12.0 - 15.0 g/dL   HCT 29.6 (L) 36.0 - 46.0 %   MCV 90.8 80.0 - 100.0 fL   MCH 31.0 26.0 - 34.0 pg   MCHC 34.1 30.0 - 36.0 g/dL   RDW 13.7 11.5 - 15.5 %   Platelets  309 150 - 400 K/uL   nRBC 0.0 0.0 - 0.2 %   Neutrophils Relative % 68 %   Neutro Abs 6.1 1.7 - 7.7 K/uL   Lymphocytes Relative 18 %   Lymphs Abs 1.6 0.7 - 4.0 K/uL   Monocytes Relative 10 %   Monocytes Absolute 0.9 0.1 - 1.0 K/uL   Eosinophils Relative 2 %   Eosinophils Absolute 0.1 0.0 - 0.5 K/uL   Basophils Relative 1 %   Basophils Absolute 0.1 0.0 - 0.1 K/uL   Immature Granulocytes 1 %   Abs Immature Granulocytes 0.07 0.00 - 0.07 K/uL    Comment: Performed at [facility], [address] [place], [place] [id number]  Basic metabolic panel     Status: Abnormal   Collection Time: 10/09/22  1:25 AM  Result Value Ref Range   Sodium 141 135 - 145 mmol/L   Potassium 3.0 (L) 3.5 - 5.1 mmol/L   Chloride 105 98 - 111 mmol/L   CO2 24 22 - 32 mmol/L   Glucose, Bld 129 (H) 70 - 99 mg/dL    Comment: Glucose reference range applies only to samples taken after fasting for at least 8 hours.   BUN 24 (H) 8 - 23 mg/dL   Creatinine, Ser 0.88 0.44 - 1.00 mg/dL   Calcium 9.1 8.9 - 10.3 mg/dL   GFR, Estimated >60 >60 mL/min    Comment: (NOTE) Calculated using the CKD-EPI Creatinine Equation (2021)    Anion gap 12 5 - 15    Comment: Performed at [facility] [address], [place], [place] [address]  Troponin I (High Sensitivity)     Status: None   Collection Time: 10/09/22  1:25 AM  Result Value Ref Range   Troponin I (High Sensitivity) 10 <18 ng/L    Comment: (NOTE) Elevated high sensitivity troponin I (hsTnI) values and significant  changes across serial measurements may suggest ACS but many other  chronic and acute conditions are known to elevate hsTnI results.  Refer to the "Links" section for chest pain algorithms and additional  guidance. Performed at [facility], [place] [address] [place], [place] [address]   Lipid panel     Status: None   Collection Time: 10/09/22  1:25 AM  Result Value Ref Range   Cholesterol 144 0 - 200 mg/dL   Triglycerides 84 <150 mg/dL    HDL 44 >40 mg/dL   Total CHOL/HDL Ratio 3.3 RATIO   VLDL 17 0 - 40 mg/dL   LDL Cholesterol 83 0 - 99 mg/dL    Comment:        Total Cholesterol/HDL:CHD Risk Coronary Heart Disease Risk Table  Men   Women  1/2 Average Risk   3.4   3.3  Average Risk       5.0   4.4  2 X Average Risk   9.6   7.1  3 X Average Risk  23.4   11.0        Use the calculated Patient Ratio above and the CHD Risk Table to determine the patient's CHD Risk.        ATP III CLASSIFICATION (LDL):  <100     mg/dL   Optimal  100-129  mg/dL   Near or Above                    Optimal  130-159  mg/dL   Borderline  160-189  mg/dL   High  >190     mg/dL   Very High Performed at [facility] [address] [place], [place] [address]   TSH     Status: None   Collection Time: 10/09/22  1:25 AM  Result Value Ref Range   TSH 0.591 0.350 - 4.500 uIU/mL    Comment: Performed by a 3rd Generation assay with a functional sensitivity of <=0.01 uIU/mL. Performed at [facility], [place] [address] [place], [place] [address]   Brain natriuretic peptide     Status: Abnormal   Collection Time: 10/09/22  1:25 AM  Result Value Ref Range   B Natriuretic Peptide 185.5 (H) 0.0 - 100.0 pg/mL    Comment: Performed at [facility] [address] [place], [place] [address]  Troponin I (High Sensitivity)     Status: Abnormal   Collection Time: 10/09/22  5:10 AM  Result Value Ref Range   Troponin I (High Sensitivity) 58 (H) <18 ng/L    Comment: RESULT CALLED TO, READ BACK BY AND VERIFIED WITH M.JACKSON,RN. [id number] 10/09/22. LPAIT (NOTE) Elevated high sensitivity troponin I (hsTnI) values and significant  changes across serial measurements may suggest ACS but many other  chronic and acute conditions are known to elevate hsTnI results.  Refer to the "Links" section for chest pain algorithms and additional  guidance. Performed at [facility], [place] [address], [place], [place] [address]   CBC      Status: Abnormal   Collection Time: 10/10/22  1:05 AM  Result Value Ref Range   WBC 8.8 4.0 - 10.5 K/uL   RBC 3.15 (L) 3.87 - 5.11 MIL/uL   Hemoglobin 9.6 (L) 12.0 - 15.0 g/dL   HCT 29.6 (L) 36.0 - 46.0 %   MCV 94.0 80.0 - 100.0 fL   MCH 30.5 26.0 - 34.0 pg   MCHC 32.4 30.0 - 36.0 g/dL   RDW 13.9 11.5 - 15.5 %   Platelets 330 150 - 400 K/uL   nRBC 0.0 0.0 - 0.2 %    Comment: Performed at [facility], [place] [address] [place], [place] [address]   CT Angio Chest PE W and/or Wo Contrast  Result Date: 10/09/2022 CLINICAL DATA:  Syncope/presyncope, cerebrovascular cause suspected. Left-sided chest pain radiating to the left arm and palpitations EXAM: CT ANGIOGRAPHY CHEST WITH CONTRAST TECHNIQUE: Multidetector CT imaging of the chest was performed using the standard protocol during bolus administration of intravenous contrast. Multiplanar CT image reconstructions and MIPs were obtained to evaluate the vascular anatomy. RADIATION DOSE REDUCTION: This exam was performed according to the departmental dose-optimization program which includes automated exposure control, adjustment of the mA and/or kV according to  patient size and/or use of iterative reconstruction technique. CONTRAST:  42m OMNIPAQUE IOHEXOL 350 MG/ML SOLN COMPARISON:  CT chest heart morphology 07/08/2020 and radiographs 10/09/2022 FINDINGS: Cardiovascular: Satisfactory opacification of the pulmonary arteries to the segmental level. No evidence of pulmonary embolism. Cardiomegaly. No pericardial effusion. Coronary artery and aortic atherosclerotic calcification. Sternotomy and CABG. The main pulmonary artery is mildly dilated measuring 35 mm. Mediastinum/Nodes: 9 mm right thyroid nodule. No follow-up recommended. Unremarkable esophagus. No thoracic adenopathy. Lungs/Pleura: Diffuse interlobular septal thickening with mild patchy ground-glass opacities. No pleural effusion or pneumothorax. The central airways are patent. Upper Abdomen:  No acute abnormality.  Cholecystectomy. Musculoskeletal: Unchanged compression deformity of T11. No acute fracture. Review of the MIP images confirms the above findings. IMPRESSION: 1. No evidence of pulmonary embolism. 2. Diffuse interlobular septal thickening with mild patchy ground-glass opacities, favored to represent mild pulmonary edema versus atypical infection. 3. Dilated main pulmonary artery can be seen with pulmonary hypertension. Aortic Atherosclerosis (ICD10-I70.0). Electronically Signed   By: TPlacido SouM.D.   On: 10/09/2022 02:48   DG Chest Portable 1 View  Result Date: 10/09/2022 CLINICAL DATA:  Pain. EXAM: PORTABLE CHEST 1 VIEW COMPARISON:  09/21/2020. FINDINGS: The heart is enlarged and the mediastinal contour is within normal limits. No consolidation, effusion, or pneumothorax. Sternotomy wires are present over the midline. No acute osseous abnormality. IMPRESSION: Cardiomegaly with no active disease. Electronically Signed   By: LBrett FairyM.D.   On: 10/09/2022 01:46    Pending Labs Unresulted Labs (From admission, onward)     Start     Ordered   10/10/22 0500  Magnesium  Tomorrow morning,   R        10/09/22 0752   10/10/22 [id number] Basic metabolic panel  Tomorrow morning,   R        10/09/22 0752   10/09/22 1748  Urinalysis, Routine w reflex microscopic -Urine, Clean Catch  Once,   R       Question:  Specimen Source  Answer:  Urine, Clean Catch   10/09/22 1747            Vitals/Pain Today's Vitals   10/09/22 1830 10/09/22 1857 10/09/22 2200 10/09/22 2214  BP: (!) 134/53  (!) 144/58 (!) 144/50  Pulse: 60   66  Resp: '16  20 16  '$ Temp:  99 F (37.2 C)  98.7 F (37.1 C)  TempSrc:  Oral  Oral  SpO2: 94%   95%  Weight:      Height:      PainSc:        Isolation Precautions No active isolations  Medications Medications  sodium chloride flush (NS) 0.9 % injection 3 mL (3 mLs Intravenous Given 10/09/22 2217)  acetaminophen (TYLENOL) tablet 650 mg (has no  administration in time range)    Or  acetaminophen (TYLENOL) suppository 650 mg (has no administration in time range)  albuterol (PROVENTIL) (2.5 MG/3ML) 0.083% nebulizer solution 2.5 mg (has no administration in time range)  ondansetron (ZOFRAN) tablet 4 mg (has no administration in time range)    Or  ondansetron (ZOFRAN) injection 4 mg (has no administration in time range)  atorvastatin (LIPITOR) tablet 10 mg (10 mg Oral Given 10/09/22 1206)  metoprolol tartrate (LOPRESSOR) tablet 12.5 mg (12.5 mg Oral Given 10/09/22 2217)  apixaban (ELIQUIS) tablet 5 mg (5 mg Oral Given 10/09/22 1606)  HYDROcodone-acetaminophen (NORCO/VICODIN) 5-325 MG per tablet 1 tablet (has no administration in time range)  amLODipine (NORVASC) tablet 5 mg (5  mg Oral Not Given 10/09/22 2100)  LORazepam (ATIVAN) tablet 0.5 mg (0.5 mg Oral Given 10/09/22 2221)  sodium chloride 0.9 % bolus 1,000 mL (0 mLs Intravenous Stopped 10/09/22 0305)  iohexol (OMNIPAQUE) 350 MG/ML injection 75 mL (75 mLs Intravenous Contrast Given 10/09/22 0236)  furosemide (LASIX) injection 40 mg (40 mg Intravenous Given 10/09/22 0415)  magnesium sulfate IVPB 2 g 50 mL (0 g Intravenous Stopped 10/09/22 0645)  potassium chloride SA (KLOR-CON M) CR tablet 40 mEq (40 mEq Oral Given 10/09/22 0534)  potassium chloride SA (KLOR-CON M) CR tablet 40 mEq (40 mEq Oral Given 10/09/22 0831)  heparin bolus via infusion 4,000 Units (4,000 Units Intravenous Bolus from Bag 10/09/22 0852)    Mobility walks with person assist     Focused Assessments Cardiac Assessment Handoff:  Cardiac Rhythm: Normal sinus rhythm No results found for: "CKTOTAL", "CKMB", "CKMBINDEX", "TROPONINI" No results found for: "DDIMER" Does the Patient currently have chest pain? No    R Recommendations: See Admitting Provider Note  Report given to:   Additional Notes: pt confused oriented only to self, this is a change from baseline. Had rt knee surgery 6 days ago.

## 2022-10-10 NOTE — TOC Benefit Eligibility Note (Signed)
Patient Advocate Encounter  Insurance verification completed.    The patient is currently admitted and upon discharge could be taking Eliquis 5 mg.  The current 30 day co-pay is $47.00.   The patient is insured through [employer] Part D   Oria Klimas, CPHT Pharmacy Patient Advocate Specialist [facility] Patient Advocate Team Direct Number: [phone]  Fax: [phone]       

## 2022-10-10 NOTE — Progress Notes (Signed)
PROGRESS NOTE  Debbie Cardenas [id number] DOB: Jul 10, 1946   PCP: Harrison Mons, PA  Patient is from: Home. Lives with huband  DOA: 10/09/2022 LOS: 0  Chief complaints Chief Complaint  Patient presents with   Chest Pain     Brief Narrative / Interim history: 77 year old F with PMH of CAD/CABG in 2022, paroxysmal A-fib, HTN, HLD, bilateral knee arthritis s/p right TKA by Dr. Ninfa Linden on 3/5 presenting with left-sided chest pain and admitted for A-fib with RVR with HR in 160s.  Cardiology consulted.  CTA chest negative for PE but concerning for CHF.  She had a slightly elevated troponin which was felt to be demand ischemia in the setting of A-fib with RVR.  She was started on IV Cardizem and admitted.  Hospital course complicated by delirium with visual hallucination.    Subjective: Seen and examined earlier this morning.  Patient is somewhat anxious.  She knows she is in the hospital and likes to go home and be with her dog and her husband.  At times, her speech does not make sense, and off topic.   Objective: Vitals:   10/10/22 0400 10/10/22 0527 10/10/22 0827 10/10/22 1251  BP:  (!) 148/55 (!) 129/51 (!) 140/57  Pulse:  69 73 64  Resp:  '16 17 15  '$ Temp:  98.3 F (36.8 C) 98.3 F (36.8 C) 99.5 F (37.5 C)  TempSrc:  Oral Oral Axillary  SpO2:  98% 99% 100%  Weight: 96.4 kg     Height: '5\' 5"'$  (1.651 m)       Examination:  GENERAL: No apparent distress.  Nontoxic. HEENT: MMM.  Vision and hearing grossly intact.  NECK: Supple.  No apparent JVD.  RESP:  No IWOB.  Fair aeration bilaterally. CVS:  RRR. Heart sounds normal.  ABD/GI/GU: BS+. Abd soft, NTND.  MSK/EXT:  Moves extremities. No apparent deformity. No edema.  SKIN: no apparent skin lesion or wound NEURO: Awake, alert and oriented to self and place but not time.  No apparent focal neuro deficit. PSYCH: Calm. Normal affect.   Procedures:  None  Microbiology summarized: None  Assessment and  plan: Principal Problem:   Atrial fibrillation with RVR (HCC) Active Problems:   Coronary 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)   Chest pain   Elevated troponin   Pulmonary edema   Hypokalemia   Normocytic anemia   HTN (hypertension)   Status post total right knee replacement   Hyperlipidemia   S/P CABG x 4   Memory loss   Delirium   Obesity (BMI 30-39.9)  Chest pain/elevated troponin/history of CAD/CABG:Elevated troponin felt to be demand ischemia in the setting of A-fib with RVR.  CTA chest negative for PE but suggestive of pulmonary edema.  She was given a dose of IV Lasix in ED.  Good urine output.  Appears euvolemic on exam.  Chest pain resolved. -Follow echocardiogram -Continue statin. -Aspirin discontinued while on Eliquis.   Atrial fibrillation with RVR: RVR resolved.  Now in sinus rhythm.  CHA2DS2-VASc score 5-6 -On Lopressor 12.5 mg twice daily -On Eliquis for anticoagulation -Follow echocardiogram -Optimize electrolytes   Pulmonary edema/elevated BNP: Appears euvolemic now.  Received IV Lasix in ED. -Follow echocardiogram  Delirium: History of short-term memory loss.  Has been confused since knee surgery.  Confusion worse overnight.  Was hallucinating.  Received IV Ativan earlier last night.  She was on Robaxin and Norco at home after surgery.  No focal neurodeficit.  TSH and B12 slightly low.  Ammonia normal.  No fever or leukocytosis to suggest infectious process. -Follow CT. -Vitamin B12 injection x 2 followed by p.o. -Reorientation and delirium precaution -Monitor for bowel or bladder habit change. -Schedule Tylenol while awake for pain -Hold opiates and muscle relaxers. -Needs formal evaluation for dementia outpatient   Normocytic anemia: H&H relatively stable.  Anemia panel suggests anemia of chronic disease Recent Labs    09/26/22 1350 10/03/22 0433 10/09/22 0125 10/10/22 0105  HGB 12.5 11.9* 10.1*  9.6*  -Monitor   Essential hypertension: Normotensive for most part -Continue low-dose Lopressor and amlodipine   S/p right knee replacement: Right TKA by Dr. Ninfa Linden on 3/5. -Pain control as above.   Hyperlipidemia -Continue atorvastatin  Hypokalemia -P.o. KCl 40 x 1   Obesity: Body mass index is 35.37 kg/m.          DVT prophylaxis:  Place TED hose Start: 10/10/22 1414 apixaban (ELIQUIS) tablet 5 mg  Code Status: Full code Family Communication: None at bedside Level of care: Telemetry Medical Status is: Observation The patient will require care spanning > 2 midnights and should be moved to inpatient because: Delirium and A-fib with RVR   Final disposition: TBD Consultants:  Cardiology  55 minutes with more than 50% spent in reviewing records, counseling patient/family and coordinating care.   Sch Meds:  Scheduled Meds:  acetaminophen  650 mg Oral Q6H WA   amLODipine  5 mg Oral Daily   apixaban  5 mg Oral BID   atorvastatin  10 mg Oral Daily   [START ON 10/11/2022] cyanocobalamin  1,000 mcg Intramuscular Daily   Followed by   Derrill Memo ON 10/13/2022] vitamin B-12  1,000 mcg Oral Daily   metoprolol tartrate  12.5 mg Oral BID   sodium chloride flush  3 mL Intravenous Q12H   Continuous Infusions: PRN Meds:.albuterol, ondansetron **OR** ondansetron (ZOFRAN) IV  Antimicrobials: Anti-infectives (From admission, onward)    None        I have personally reviewed the following labs and images: CBC: Recent Labs  Lab 10/09/22 0125 10/10/22 0105  WBC 8.8 8.8  NEUTROABS 6.1  --   HGB 10.1* 9.6*  HCT 29.6* 29.6*  MCV 90.8 94.0  PLT 309 330   BMP &GFR Recent Labs  Lab 10/09/22 0125 10/10/22 0105  NA 141 138  K 3.0* 3.5  CL 105 105  CO2 24 24  GLUCOSE 129* 102*  BUN 24* 18  CREATININE 0.88 0.78  CALCIUM 9.1 8.9  MG  --  2.1   Estimated Creatinine Clearance: 69.8 mL/min (by C-G formula based on SCr of 0.78 mg/dL). Liver & Pancreas: No results  for input(s): "AST", "ALT", "ALKPHOS", "BILITOT", "PROT", "ALBUMIN" in the last 168 hours. No results for input(s): "LIPASE", "AMYLASE" in the last 168 hours. Recent Labs  Lab 10/10/22 1113  AMMONIA 21   Diabetic: No results for input(s): "HGBA1C" in the last 72 hours. No results for input(s): "GLUCAP" in the last 168 hours. Cardiac Enzymes: No results for input(s): "CKTOTAL", "CKMB", "CKMBINDEX", "TROPONINI" in the last 168 hours. No results for input(s): "PROBNP" in the last 8760 hours. Coagulation Profile: No results for input(s): "INR", "PROTIME" in the last 168 hours. Thyroid Function Tests: Recent Labs    10/10/22 1113  TSH 0.326*   Lipid Profile: Recent Labs    10/09/22 0125  CHOL 144  HDL 44  LDLCALC 83  TRIG 84  CHOLHDL 3.3   Anemia Panel: Recent Labs  10/10/22 1113  VITAMINB12 218  FOLATE 21.7  FERRITIN 231  TIBC 241*  IRON 52  RETICCTPCT 3.5*   Urine analysis:    Component Value Date/Time   COLORURINE AMBER (A) 08/11/2020 0959   APPEARANCEUR CLOUDY (A) 08/11/2020 0959   LABSPEC 1.021 08/11/2020 0959   PHURINE 5.0 08/11/2020 0959   GLUCOSEU NEGATIVE 08/11/2020 0959   HGBUR NEGATIVE 08/11/2020 0959   BILIRUBINUR NEGATIVE 08/11/2020 0959   KETONESUR NEGATIVE 08/11/2020 0959   PROTEINUR 100 (A) 08/11/2020 0959   NITRITE NEGATIVE 08/11/2020 0959   LEUKOCYTESUR NEGATIVE 08/11/2020 0959   Sepsis Labs: Invalid input(s): "PROCALCITONIN", "LACTICIDVEN"  Microbiology: No results found for this or any previous visit (from the past 240 hour(s)).  Radiology Studies: No results found.    Rhianon Zabawa T. [place]  If 7PM-7AM, please contact night-coverage [email or web address] 10/10/2022, 2:16 PM

## 2022-10-10 NOTE — Progress Notes (Signed)
Patient ID: Debbie Cardenas, female   DOB: 25-Aug-1945, 77 y.o.   MRN: [id number]  Patient remains confused but  recognizes family members in room. Thinks she is at home. Denies any pain in right knee at rest. Walked  350 feet with PT today.   General:Awake and alert  Psych:  Remains confused Right knee : Decreased edema compared to yesterday . Range of motion improved with passive flexion now closer to 80 degrees. Dressing clean and dry.    Ct scan of head done results pending.  Appreciate physical therapy working with her today. Will make sure Home therapy is re-established at discharge. Compression hose bilateral lower extremities. Appreciate Cardiology and  Hospitalist care for patient.

## 2022-10-11 ENCOUNTER — Other Ambulatory Visit (HOSPITAL_COMMUNITY): Payer: Self-pay

## 2022-10-11 ENCOUNTER — Encounter (HOSPITAL_COMMUNITY): Payer: Self-pay | Admitting: Internal Medicine

## 2022-10-11 DIAGNOSIS — I4891 Unspecified atrial fibrillation: Secondary | ICD-10-CM | POA: Diagnosis not present

## 2022-10-11 DIAGNOSIS — Z951 Presence of aortocoronary bypass graft: Secondary | ICD-10-CM | POA: Diagnosis not present

## 2022-10-11 DIAGNOSIS — J811 Chronic pulmonary edema: Secondary | ICD-10-CM

## 2022-10-11 DIAGNOSIS — R413 Other amnesia: Secondary | ICD-10-CM | POA: Diagnosis not present

## 2022-10-11 DIAGNOSIS — R410 Disorientation, unspecified: Secondary | ICD-10-CM | POA: Diagnosis not present

## 2022-10-11 LAB — CBC
HCT: 28.2 % — ABNORMAL LOW (ref 36.0–46.0)
Hemoglobin: 9.8 g/dL — ABNORMAL LOW (ref 12.0–15.0)
MCH: 31.5 pg (ref 26.0–34.0)
MCHC: 34.8 g/dL (ref 30.0–36.0)
MCV: 90.7 fL (ref 80.0–100.0)
Platelets: 349 10*3/uL (ref 150–400)
RBC: 3.11 MIL/uL — ABNORMAL LOW (ref 3.87–5.11)
RDW: 13.9 % (ref 11.5–15.5)
WBC: 8.6 10*3/uL (ref 4.0–10.5)
nRBC: 0 % (ref 0.0–0.2)

## 2022-10-11 LAB — RENAL FUNCTION PANEL
Albumin: 3.2 g/dL — ABNORMAL LOW (ref 3.5–5.0)
Anion gap: 9 (ref 5–15)
BUN: 18 mg/dL (ref 8–23)
CO2: 22 mmol/L (ref 22–32)
Calcium: 8.9 mg/dL (ref 8.9–10.3)
Chloride: 108 mmol/L (ref 98–111)
Creatinine, Ser: 0.77 mg/dL (ref 0.44–1.00)
GFR, Estimated: >60 mL/min (ref >60)
Glucose, Bld: 95 mg/dL (ref 70–99)
Phosphorus: 3.3 mg/dL (ref 2.5–4.6)
Potassium: 3.8 mmol/L (ref 3.5–5.1)
Sodium: 139 mmol/L (ref 135–145)

## 2022-10-11 LAB — CK: Total CK: 133 U/L (ref 38–234)

## 2022-10-11 LAB — MAGNESIUM: Magnesium: 2.2 mg/dL (ref 1.7–2.4)

## 2022-10-11 MED ORDER — CYANOCOBALAMIN 1000 MCG PO TABS
1000.0000 ug | ORAL_TABLET | Freq: Every day | ORAL | 1 refills | Status: AC
  Filled 2022-10-11: qty 90, 90d supply, fill #0

## 2022-10-11 MED ORDER — METOPROLOL TARTRATE 25 MG PO TABS
12.5000 mg | ORAL_TABLET | Freq: Two times a day (BID) | ORAL | 1 refills | Status: DC
  Filled 2022-10-11: qty 90, 90d supply, fill #0

## 2022-10-11 MED ORDER — APIXABAN 5 MG PO TABS
5.0000 mg | ORAL_TABLET | Freq: Two times a day (BID) | ORAL | 2 refills | Status: DC
  Filled 2022-10-11: qty 60, 30d supply, fill #0

## 2022-10-11 MED ORDER — ACETAMINOPHEN 325 MG PO TABS: 650.0000 mg | ORAL_TABLET | Freq: Four times a day (QID) | ORAL | Status: AC | PRN

## 2022-10-11 NOTE — Progress Notes (Signed)
   Plan noted for discharge. Patient no longer on telemetry but data prior to cessation showed NSR with HRs 50s-70s, no recurrent afib. Per IM, no need for cardiology to see today. Did alert Dr. Cyndia Skeeters to abnormal thyroid function raising question of hyperthyroidism which may be contributing to presentation of a-fib. Dr. Cyndia Skeeters recommends OP f/u for this.  Otherwise cardiology f/u has been scheduled 10/18/22 with APP, appt placed on AVS. Please call with questions.  Charlie Pitter, PA-C

## 2022-10-11 NOTE — Progress Notes (Signed)
Physical Therapy Treatment Patient Details Name: Debbie Cardenas MRN: [id number] DOB: [birth year] Today's Date: 10/11/2022   History of Present Illness 77 y.o. female presents to [facility] hospital on 10/02/2022 for elective R TKA. PMH includes CABG x 4, HTN, HLD, OA, anxiety.  patient in the ER where she is being observed and treated for acute chest pain and atrial fibrillation with RVR    PT Comments    Pt tolerated treatment well today. Pt was able to ambulate in hallway Mod I RW and navigate stairs. Pt required cues for proper sequencing and demonstrated good carryover. Pt is anticipating DC today. Recommend pt resume HHPT upon DC. PT will continue follow.    Recommendations for follow up therapy are one component of a multi-disciplinary discharge planning process, led by the attending physician.  Recommendations may be updated based on patient status, additional functional criteria and insurance authorization.  Follow Up Recommendations  Home health PT (Resume HHPT)     Assistance Recommended at Discharge Set up Supervision/Assistance  Patient can return home with the following A little help with walking and/or transfers;A little help with bathing/dressing/bathroom;Assistance with cooking/housework;Assist for transportation;Help with stairs or ramp for entrance   Equipment Recommendations  None recommended by PT (Has needed DME)    Recommendations for Other Services       Precautions / Restrictions Precautions Precautions: Knee;Fall Precaution Booklet Issued: No Restrictions Weight Bearing Restrictions: Yes RLE Weight Bearing: Weight bearing as tolerated     Mobility  Bed Mobility               General bed mobility comments: Pt standing up at sink upon arrival.    Transfers                   General transfer comment: Pt already standing without AD upon arrival    Ambulation/Gait Ambulation/Gait assistance: Modified independent (Device/Increase time) Gait  Distance (Feet): 300 Feet Assistive device: Rolling walker (2 wheels) Gait Pattern/deviations: Step-through pattern, Drifts right/left, Decreased stance time - left Gait velocity: decreased     General Gait Details: Pt required cues for staying within walker   Stairs Stairs: Yes Stairs assistance: Min guard Stair Management: Two rails, Step to pattern, Forwards Number of Stairs: 5 General stair comments: up/down steps in stairwell with cues for sequencing with pt and husband able to recall at end of session. No LOB noted   Wheelchair Mobility    Modified Rankin (Stroke Patients Only)       Balance Overall balance assessment: No apparent balance deficits (not formally assessed)                                          Cognition Arousal/Alertness: Awake/alert Behavior During Therapy: WFL for tasks assessed/performed Overall Cognitive Status: Within Functional Limits for tasks assessed                                 General Comments: Pt appeared to be much more alert and oriented today compared to yesterday.        Exercises      General Comments General comments (skin integrity, edema, etc.): VSS on RA      Pertinent Vitals/Pain Pain Assessment Pain Assessment: No/denies pain    Home Living  Prior Function            PT Goals (current goals can now be found in the care plan section) Progress towards PT goals: Progressing toward goals    Frequency    Min 3X/week      PT Plan Current plan remains appropriate    Co-evaluation              AM-PAC PT "6 Clicks" Mobility   Outcome Measure  Help needed turning from your back to your side while in a flat bed without using bedrails?: None Help needed moving from lying on your back to sitting on the side of a flat bed without using bedrails?: None Help needed moving to and from a bed to a chair (including a wheelchair)?: None Help  needed standing up from a chair using your arms (e.g., wheelchair or bedside chair)?: None Help needed to walk in hospital room?: None Help needed climbing 3-5 steps with a railing? : A Little 6 Click Score: 23    End of Session Equipment Utilized During Treatment: Gait belt Activity Tolerance: Patient tolerated treatment well Patient left: in bed;with call bell/phone within reach;with family/visitor present Nurse Communication: Mobility status PT Visit Diagnosis: Other abnormalities of gait and mobility (R26.89);Muscle weakness (generalized) (M62.81);Pain Pain - Right/Left: Right Pain - part of body: Knee     Time: [id number] PT Time Calculation (min) (ACUTE ONLY): 13 min  Charges:  $Gait Training: 8-22 mins                     Shelby Mattocks, PT, DPT Acute Rehab Services [id number]    Viann Shove 10/11/2022, 10:06 AM

## 2022-10-11 NOTE — Discharge Summary (Signed)
Physician Discharge Summary  Debbie Cardenas [id number] DOB: 14-Dec-1945 DOA: 10/09/2022  PCP: Harrison Mons, PA  Admit date: 10/09/2022 Discharge date: 10/11/2022 Admitted From: Home Disposition: Home Recommendations for Outpatient Follow-up:  Follow up with PCP in 1 to 2 weeks Cardiology to arrange outpatient follow-up CMP and CBC at follow-up Recommend repeat thyroid panel including TSH and free T4 in 3 to 4 weeks Please follow up on the following pending results: None  Home Health: Patient has home health from previous hospitalization Equipment/Devices: None  Discharge Condition: Stable CODE STATUS: Full code  Follow-up Information     [facility] [place] Follow up.   Specialty: Home Health Services Why: [place] to call with visit times. Contact information: [address] [place] [address] [phone]         Harrison Mons, PA. Schedule an appointment as soon as possible for a visit in 1 week(s).   Specialty: Family Medicine Contact information: [place] [address] [phone]         Debbie Pelton, NP Follow up.   Specialty: Cardiology Why: Debbie Cardenas - [facility] location - cardiology follow-up scheduled on Thursday Oct 18, 2022 10:55 AM (Arrive by 10:40 AM). Denyse Amass is one of the nurse practitioners that works with Dr. Martinique. Contact information: [address] [place] [address] [phone]                 Hospital course 77 year old F with PMH of CAD/CABG in 2022, paroxysmal A-fib, HTN, HLD, bilateral knee arthritis s/p right TKA by Dr. Ninfa Linden on 3/5 presenting with left-sided chest pain and admitted for A-fib with RVR with HR in 160s.  Cardiology consulted.  CTA chest negative for PE but concerning for CHF.  She had a slightly elevated troponin which was felt to be demand ischemia in the setting of A-fib with RVR.  She was started on IV  Cardizem and admitted.  The next day, evaluated by cardiology and started on low-dose metoprolol and Eliquis.  TTE without significant finding.  Thyroid panel with slightly low TSH and slightly elevated free T4.  Recommend repeat thyroid panel outpatient.   Hospital course complicated by delirium with visual hallucination that has resolved.  Home Robaxin and Norco discontinued.  See individual problem list below for more.   Problems addressed during this hospitalization Principal Problem:   Atrial fibrillation with RVR (HCC) Active Problems:   Coronary 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)   Chest pain   Elevated troponin   Pulmonary edema   Hypokalemia   Normocytic anemia   HTN (hypertension)   Status post total right knee replacement   Hyperlipidemia   S/P CABG x 4   Memory loss   Delirium   Chest pain/elevated troponin/history of CAD/CABG:Elevated troponin felt to be demand ischemia in the setting of A-fib with RVR.  CTA chest negative for PE but suggestive of pulmonary edema.  She was given a dose of IV Lasix in ED.  Good urine output.  Appears euvolemic on exam.  Chest pain resolved.  TTE without significant finding (see for reading below). -Continue statin.  Aspirin discontinued since she is a started on Eliquis.   Atrial fibrillation with RVR: RVR resolved.  Now in sinus rhythm.  CHA2DS2-VASc score 5-6.  TSH slightly low.  Free T4 slightly elevated. -Started on Lopressor 12.5 mg twice daily and Eliquis 5 mg twice daily. -  Outpatient follow-up with cardiology -Recheck thyroid panel in 4 to 6 weeks   Pulmonary edema/elevated BNP: Appears euvolemic now.  Received IV Lasix in ED. TTE without significant finding.   Delirium: Likely iatrogenic from pain medication after knee surgery.  CT head without acute finding.  No signs of infection.  B12 and TSH slightly low.  Free T4 slightly high.  Ammonia within normal.  Received vitamin  B12 injection x 1.  Delirium resolved.  Oriented x 4. -Discontinued home Norco and Robaxin -Continue p.o. vitamin B12 and recheck in about a months -Check thyroid panel outpatient.   Normocytic anemia: H&H relatively stable.  Anemia panel suggests anemia of chronic disease -P.o. vitamin B12   Essential hypertension: Normotensive for most part -Continue low-dose Lopressor and amlodipine -Discontinued HCTZ.   S/p right knee replacement: Right TKA by Dr. Ninfa Linden on 3/5.  Right knee dressing change on 3/12. -Outpatient follow-up with orthopedic surgery -Patient has home health.   Hyperlipidemia -Continue atorvastatin   Hypokalemia: Resolved.             Vital signs Vitals:   10/10/22 2030 10/10/22 2355 10/11/22 0404 10/11/22 0845  BP: (!) 135/43 (!) 153/64 (!) 147/47 (!) 158/57  Pulse: 63 64 (!) 55 60  Temp: 97.6 F (36.4 C) 98.3 F (36.8 C) 98.4 F (36.9 C) 98 F (36.7 C)  Resp: '13 18 17 17  '$ Height:      Weight:   68.7 kg   SpO2: 96% 94% 99% 96%  TempSrc: Oral Oral Axillary Oral  BMI (Calculated):   25.2      Discharge exam  GENERAL: No apparent distress.  Nontoxic. HEENT: MMM.  Vision and hearing grossly intact.  NECK: Supple.  No apparent JVD.  RESP:  No IWOB.  Fair aeration bilaterally. CVS:  RRR. Heart sounds normal.  ABD/GI/GU: BS+. Abd soft, NTND.  MSK/EXT:  Moves extremities.  Dressing over right knee DCI.  No signs of infection. SKIN: no apparent skin lesion or wound NEURO: Awake and alert. Oriented x 4.  No apparent focal neuro deficit. PSYCH: Calm. Normal affect.   Discharge Instructions Discharge Instructions     Call MD for:  difficulty breathing, headache or visual disturbances   Complete by: As directed    Call MD for:  persistant dizziness or light-headedness   Complete by: As directed    Call MD for:  redness, tenderness, or signs of infection (pain, swelling, redness, odor or green/yellow discharge around incision site)   Complete by: As  directed    Call MD for:  severe uncontrolled pain   Complete by: As directed    Diet - low sodium heart healthy   Complete by: As directed    Discharge instructions   Complete by: As directed    It has been a pleasure taking care of you!  You were hospitalized due to atrial fibrillation (irregular and fast heart rate) and confusion.  You have been started on medication for you atrial fibrillation to prevent stroke.  Confusion is likely from pain medications that we have stopped.  You may use Tylenol as needed for pain.  Please review your new medication list and the directions on your medications before you take them.  Follow-up with your primary care doctor in 1 to 2 weeks or sooner if needed.  Avoid any over-the-counter pain medication other than plain Tylenol while taking Eliquis.   Take care,   Increase activity slowly   Complete by: As directed  Allergies as of 10/11/2022       Reactions   Tramadol Itching   Pt itches the day after taking but can tolerate it   Ephedrine    "My dentist told me to never let anyone give me this"   Ace Inhibitors Cough        Medication List     STOP taking these medications    aspirin 81 MG chewable tablet   hydrochlorothiazide 12.5 MG capsule Commonly known as: MICROZIDE   HYDROcodone-acetaminophen 5-325 MG tablet Commonly known as: NORCO/VICODIN   MAGNESIUM OXIDE PO   methocarbamol 500 MG tablet Commonly known as: ROBAXIN       TAKE these medications    acetaminophen 325 MG tablet Commonly known as: TYLENOL Take 2 tablets (650 mg total) by mouth every 6 (six) hours as needed.   amLODipine 5 MG tablet Commonly known as: NORVASC TAKE 1 TABLET (5 MG TOTAL) BY MOUTH DAILY.   atorvastatin 10 MG tablet Commonly known as: LIPITOR TAKE 1 TABLET BY MOUTH EVERY DAY   cyanocobalamin 1000 MCG tablet Commonly known as: VITAMIN B12 Take 1 tablet (1,000 mcg total) by mouth daily. Start taking on: October 13, 2022    Eliquis 5 MG Tabs tablet Generic drug: apixaban Take 1 tablet (5 mg total) by mouth 2 (two) times daily.   metoprolol tartrate 25 MG tablet Commonly known as: LOPRESSOR Take 0.5 tablets (12.5 mg total) by mouth 2 (two) times daily.   Sodium Fluoride 5000 PPM 1.1 % Pste Generic drug: Sodium Fluoride Place 1 Application onto teeth at bedtime.   Vitamin D 50 MCG (2000 UT) tablet Take 2,000 Units by mouth daily.        Consultations: Cardiology  Procedures/Studies:   ECHOCARDIOGRAM COMPLETE  Result Date: 10/10/2022    ECHOCARDIOGRAM REPORT   Patient Name:   Debbie Cardenas Date of Exam: 10/10/2022 Medical Rec #:  [id number]        Height:       65.0 in Accession #:    [id number]       Weight:       153.9 lb Date of Birth:  [birth year]        BSA:          1.770 m Patient Age:    61 years         BP:           140/57 mmHg Patient Gender: F                HR:           65 bpm. Exam Location:  Inpatient Procedure: 2D Echo, Cardiac Doppler and Color Doppler Indications:    Atrial Fibrillation I48.91  History:        Patient has prior history of Echocardiogram examinations, most                 recent 08/16/2018. CAD, Prior CABG, Arrythmias:Atrial                 Fibrillation, Signs/Symptoms:Chest Pain; Risk                 Factors:Hypertension and Dyslipidemia.  Sonographer:    [name] Referring Phys: [id number] RONDELL A SMITH IMPRESSIONS  1. Left ventricular ejection fraction, by estimation, is 60 to 65%. The left ventricle has normal function. The left ventricle has no regional wall motion abnormalities. Left ventricular diastolic parameters were grossly normal.  2. Right ventricular systolic function is  normal. The right ventricular size is normal. Tricuspid regurgitation signal is inadequate for assessing PA pressure.  3. The mitral valve is normal in structure. Trivial mitral valve regurgitation. No evidence of mitral stenosis.  4. The aortic valve is normal in structure. Aortic valve  regurgitation is not visualized. No aortic stenosis is present.  5. The inferior vena cava is dilated in size with >50% respiratory variability, suggesting right atrial pressure of 8 mmHg. FINDINGS  Left Ventricle: Left ventricular ejection fraction, by estimation, is 60 to 65%. The left ventricle has normal function. The left ventricle has no regional wall motion abnormalities. The left ventricular internal cavity size was normal in size. There is  no left ventricular hypertrophy. Abnormal (paradoxical) septal motion consistent with post-operative status. Left ventricular diastolic parameters were normal. Right Ventricle: The right ventricular size is normal. No increase in right ventricular wall thickness. Right ventricular systolic function is normal. Tricuspid regurgitation signal is inadequate for assessing PA pressure. Left Atrium: Left atrial size was normal in size. Right Atrium: Right atrial size was normal in size. Pericardium: There is no evidence of pericardial effusion. Mitral Valve: The mitral valve is normal in structure. Trivial mitral valve regurgitation. No evidence of mitral valve stenosis. Tricuspid Valve: The tricuspid valve is normal in structure. Tricuspid valve regurgitation is trivial. No evidence of tricuspid stenosis. Aortic Valve: The aortic valve is normal in structure. Aortic valve regurgitation is not visualized. No aortic stenosis is present. Aortic valve mean gradient measures 7.0 mmHg. Aortic valve peak gradient measures 12.8 mmHg. Aortic valve area, by VTI measures 2.70 cm. Pulmonic Valve: The pulmonic valve was normal in structure. Pulmonic valve regurgitation is trivial. No evidence of pulmonic stenosis. Aorta: The aortic root is normal in size and structure. Venous: The inferior vena cava is dilated in size with greater than 50% respiratory variability, suggesting right atrial pressure of 8 mmHg. IAS/Shunts: No atrial level shunt detected by color flow Doppler.  LEFT VENTRICLE  PLAX 2D LVIDd:         4.70 cm   Diastology LVIDs:         2.70 cm   LV e' medial:    7.42 cm/s LV PW:         1.10 cm   LV E/e' medial:  14.0 LVOT diam:     2.10 cm   LV e' lateral:   9.30 cm/s LV SV:         110       LV E/e' lateral: 11.2 LV SV Index:   62 LVOT Area:     3.46 cm  RIGHT VENTRICLE             IVC RV S prime:     13.70 cm/s  IVC diam: 2.30 cm TAPSE (M-mode): 1.6 cm LEFT ATRIUM           Index        RIGHT ATRIUM           Index LA diam:      3.80 cm 2.15 cm/m   RA Area:     20.60 cm LA Vol (A2C): 54.7 ml 30.91 ml/m  RA Volume:   63.00 ml  35.60 ml/m LA Vol (A4C): 52.6 ml 29.72 ml/m  AORTIC VALVE                     PULMONIC VALVE AV Area (Vmax):    2.95 cm      PR End Diast Vel: 3.02  msec AV Area (Vmean):   2.86 cm AV Area (VTI):     2.70 cm AV Vmax:           179.00 cm/s AV Vmean:          119.000 cm/s AV VTI:            0.409 m AV Peak Grad:      12.8 mmHg AV Mean Grad:      7.0 mmHg LVOT Vmax:         152.33 cm/s LVOT Vmean:        98.167 cm/s LVOT VTI:          0.319 m LVOT/AV VTI ratio: 0.78  AORTA Ao Root diam: 3.10 cm Ao Asc diam:  3.60 cm MITRAL VALVE MV Area (PHT): 3.27 cm     SHUNTS MV Decel Time: 232 msec     Systemic VTI:  0.32 m MV E velocity: 104.00 cm/s  Systemic Diam: 2.10 cm MV A velocity: 101.00 cm/s MV E/A ratio:  1.03 Cherlynn Kaiser MD Electronically signed by Cherlynn Kaiser MD Signature Date/Time: 10/10/2022/5:15:15 PM    Final    CT HEAD WO CONTRAST (5MM)  Result Date: 10/10/2022 CLINICAL DATA:  Mental status change, unknown cause EXAM: CT HEAD WITHOUT CONTRAST TECHNIQUE: Contiguous axial images were obtained from the base of the skull through the vertex without intravenous contrast. RADIATION DOSE REDUCTION: This exam was performed according to the departmental dose-optimization program which includes automated exposure control, adjustment of the mA and/or kV according to patient size and/or use of iterative reconstruction technique. COMPARISON:  None Available.  FINDINGS: Brain: No evidence of acute infarction, hemorrhage, hydrocephalus, extra-axial collection or mass lesion/mass effect. Vascular: No hyperdense vessel. Skull: No acute fracture. Sinuses/Orbits: Clear sinuses.  No acute bowel findings. Other: No mastoid effusions. IMPRESSION: No acute intracranial abnormality. Electronically Signed   By: Margaretha Sheffield M.D.   On: 10/10/2022 14:55   CT Angio Chest PE W and/or Wo Contrast  Result Date: 10/09/2022 CLINICAL DATA:  Syncope/presyncope, cerebrovascular cause suspected. Left-sided chest pain radiating to the left arm and palpitations EXAM: CT ANGIOGRAPHY CHEST WITH CONTRAST TECHNIQUE: Multidetector CT imaging of the chest was performed using the standard protocol during bolus administration of intravenous contrast. Multiplanar CT image reconstructions and MIPs were obtained to evaluate the vascular anatomy. RADIATION DOSE REDUCTION: This exam was performed according to the departmental dose-optimization program which includes automated exposure control, adjustment of the mA and/or kV according to patient size and/or use of iterative reconstruction technique. CONTRAST:  1m OMNIPAQUE IOHEXOL 350 MG/ML SOLN COMPARISON:  CT chest heart morphology 07/08/2020 and radiographs 10/09/2022 FINDINGS: Cardiovascular: Satisfactory opacification of the pulmonary arteries to the segmental level. No evidence of pulmonary embolism. Cardiomegaly. No pericardial effusion. Coronary artery and aortic atherosclerotic calcification. Sternotomy and CABG. The main pulmonary artery is mildly dilated measuring 35 mm. Mediastinum/Nodes: 9 mm right thyroid nodule. No follow-up recommended. Unremarkable esophagus. No thoracic adenopathy. Lungs/Pleura: Diffuse interlobular septal thickening with mild patchy ground-glass opacities. No pleural effusion or pneumothorax. The central airways are patent. Upper Abdomen: No acute abnormality.  Cholecystectomy. Musculoskeletal: Unchanged  compression deformity of T11. No acute fracture. Review of the MIP images confirms the above findings. IMPRESSION: 1. No evidence of pulmonary embolism. 2. Diffuse interlobular septal thickening with mild patchy ground-glass opacities, favored to represent mild pulmonary edema versus atypical infection. 3. Dilated main pulmonary artery can be seen with pulmonary hypertension. Aortic Atherosclerosis (ICD10-I70.0). Electronically Signed   By: TPlacido Sou  M.D.   On: 10/09/2022 02:48   DG Chest Portable 1 View  Result Date: 10/09/2022 CLINICAL DATA:  Pain. EXAM: PORTABLE CHEST 1 VIEW COMPARISON:  09/21/2020. FINDINGS: The heart is enlarged and the mediastinal contour is within normal limits. No consolidation, effusion, or pneumothorax. Sternotomy wires are present over the midline. No acute osseous abnormality. IMPRESSION: Cardiomegaly with no active disease. Electronically Signed   By: Brett Fairy M.D.   On: 10/09/2022 01:46   DG Knee Right Port  Result Date: 10/02/2022 CLINICAL DATA:  Status post right hip replacement. EXAM: PORTABLE RIGHT KNEE - 1-2 VIEW COMPARISON:  Preoperative radiograph 08/08/2022 FINDINGS: Right knee arthroplasty in expected alignment. No periprosthetic lucency or fracture. There has been patellar resurfacing. Recent postsurgical change includes air and edema in the soft tissues and joint space. Anterior skin staples in place. IMPRESSION: Right knee arthroplasty without immediate postoperative complication. Electronically Signed   By: Keith Rake M.D.   On: 10/02/2022 15:08       The results of significant diagnostics from this hospitalization (including imaging, microbiology, ancillary and laboratory) are listed below for reference.     Microbiology: No results found for this or any previous visit (from the past 240 hour(s)).   Labs:  CBC: Recent Labs  Lab 10/09/22 0125 10/10/22 0105 10/11/22 0159  WBC 8.8 8.8 8.6  NEUTROABS 6.1  --   --   HGB 10.1* 9.6*  9.8*  HCT 29.6* 29.6* 28.2*  MCV 90.8 94.0 90.7  PLT 309 330 349   BMP &GFR Recent Labs  Lab 10/09/22 0125 10/10/22 0105 10/11/22 0159  NA 141 138 139  K 3.0* 3.5 3.8  CL 105 105 108  CO2 '24 24 22  '$ GLUCOSE 129* 102* 95  BUN 24* 18 18  CREATININE 0.88 0.78 0.77  CALCIUM 9.1 8.9 8.9  MG  --  2.1 2.2  PHOS  --   --  3.3   Estimated Creatinine Clearance: 59.2 mL/min (by C-G formula based on SCr of 0.77 mg/dL). Liver & Pancreas: Recent Labs  Lab 10/11/22 0159  ALBUMIN 3.2*   No results for input(s): "LIPASE", "AMYLASE" in the last 168 hours. Recent Labs  Lab 10/10/22 1113  AMMONIA 21   Diabetic: No results for input(s): "HGBA1C" in the last 72 hours. No results for input(s): "GLUCAP" in the last 168 hours. Cardiac Enzymes: Recent Labs  Lab 10/11/22 0159  CKTOTAL 133   No results for input(s): "PROBNP" in the last 8760 hours. Coagulation Profile: No results for input(s): "INR", "PROTIME" in the last 168 hours. Thyroid Function Tests: Recent Labs    10/10/22 1113  TSH 0.326*  FREET4 1.24*   Lipid Profile: Recent Labs    10/09/22 0125  CHOL 144  HDL 44  LDLCALC 83  TRIG 84  CHOLHDL 3.3   Anemia Panel: Recent Labs    10/10/22 1113  VITAMINB12 218  FOLATE 21.7  FERRITIN 231  TIBC 241*  IRON 52  RETICCTPCT 3.5*   Urine analysis:    Component Value Date/Time   COLORURINE AMBER (A) 08/11/2020 0959   APPEARANCEUR CLOUDY (A) 08/11/2020 0959   LABSPEC 1.021 08/11/2020 0959   PHURINE 5.0 08/11/2020 0959   GLUCOSEU NEGATIVE 08/11/2020 0959   HGBUR NEGATIVE 08/11/2020 0959   BILIRUBINUR NEGATIVE 08/11/2020 0959   KETONESUR NEGATIVE 08/11/2020 0959   PROTEINUR 100 (A) 08/11/2020 0959   NITRITE NEGATIVE 08/11/2020 0959   LEUKOCYTESUR NEGATIVE 08/11/2020 0959   Sepsis Labs: Invalid input(s): "PROCALCITONIN", "LACTICIDVEN"   SIGNED:  Mercy Riding, MD  [facility] 10/11/2022, 5:28 PM

## 2022-10-13 ENCOUNTER — Other Ambulatory Visit: Payer: Self-pay | Admitting: Physician Assistant

## 2022-10-13 MED ORDER — ACETAMINOPHEN-CODEINE 300-30 MG PO TABS: 1.0000 | ORAL_TABLET | Freq: Three times a day (TID) | ORAL | 0 refills | Status: DC | PRN

## 2022-10-15 ENCOUNTER — Ambulatory Visit (INDEPENDENT_AMBULATORY_CARE_PROVIDER_SITE_OTHER): Payer: HMO | Admitting: Orthopaedic Surgery

## 2022-10-15 ENCOUNTER — Encounter: Payer: Self-pay | Admitting: Orthopaedic Surgery

## 2022-10-15 ENCOUNTER — Other Ambulatory Visit: Payer: Self-pay

## 2022-10-15 DIAGNOSIS — Z96651 Presence of right artificial knee joint: Secondary | ICD-10-CM

## 2022-10-15 MED ORDER — TIZANIDINE HCL 2 MG PO TABS: 2.0000 mg | ORAL_TABLET | Freq: Four times a day (QID) | ORAL | 0 refills | Status: DC | PRN

## 2022-10-15 MED ORDER — OXYCODONE HCL 5 MG PO TABS: 5.0000 mg | ORAL_TABLET | Freq: Four times a day (QID) | ORAL | 0 refills | Status: DC | PRN

## 2022-10-15 NOTE — Progress Notes (Signed)
The patient comes in for first postoperative visit status post a right total knee arthroplasty.  She is 77 years old.  She was readmitted to the hospital last week with A-fib with RVR.  She has a history of A-fib but did go into RVR requiring hospitalization.  She is on Eliquis but was on this previous prior to her operation.  Her right knee looks good in terms of the incision.  The staples are removed and Steri-Strips applied.  There is moderate swelling to be expected.  Her calf is soft.  She lacks full extension by 5 degrees and I can flex her to about 80 degrees.  She has notes from home therapy as well and does understand that we need to transition her to outpatient therapy.  She is having quite considerable pain and is tearful in the office.  I will send in some oxycodone and Zanaflex which I think will help.  We will set her up for outpatient physical therapy and I would like to see her back in 4 weeks for repeat exam but no x-rays are needed.

## 2022-10-16 NOTE — Progress Notes (Unsigned)
Cardiology Clinic Note   Patient Name: Debbie Cardenas Date of Encounter: 10/18/2022  Primary Care Provider:  Harrison Mons, PA Primary Cardiologist:  Peter Martinique, MD  Patient Profile    Debbie Cardenas 77 year old female presents to the clinic today for follow-up evaluation of her coronary artery disease, atrial fibrillation, and hypertension.  Past Medical History    Past Medical History:  Diagnosis Date   Anxiety    Arthritis    knees   Closed fracture of neck of left femur ([place]) 08/15/2018   Coronary artery disease    Hyperlipidemia    Hypertension    Pneumonia    Past Surgical History:  Procedure Laterality Date   APPENDECTOMY     CARDIAC CATHETERIZATION     CATARACT EXTRACTION, BILATERAL     CHOLECYSTECTOMY     CORONARY ARTERY BYPASS GRAFT N/A 08/15/2020   Procedure: CORONARY ARTERY BYPASS GRAFTING (CABG)X4. USING BILATERAL MAMMARY ARTERIES AND RIGHT GREATER SAPHENOUS VEIN HARVESTED ENDOSCOPICALLY.;  Surgeon: Gaye Pollack, MD;  Location: [facility];  Service: Open Heart Surgery;  Laterality: N/A;   EYE SURGERY     bilateral cataract removal   KNEE SURGERY     LEFT HEART CATH AND CORONARY ANGIOGRAPHY N/A 07/27/2020   Procedure: LEFT HEART CATH AND CORONARY ANGIOGRAPHY;  Surgeon: Martinique, Peter M, MD;  Location: [facility];  Service: Cardiovascular;  Laterality: N/A;   TEE WITHOUT CARDIOVERSION N/A 08/15/2020   Procedure: TRANSESOPHAGEAL ECHOCARDIOGRAM (TEE);  Surgeon: Gaye Pollack, MD;  Location: [facility];  Service: Open Heart Surgery;  Laterality: N/A;   TOTAL HIP ARTHROPLASTY Left 08/15/2018   Procedure: TOTAL HIP ARTHROPLASTY ANTERIOR APPROACH;  Surgeon: Mcarthur Rossetti, MD;  Location: [facility];  Service: Orthopedics;  Laterality: Left;   TOTAL KNEE ARTHROPLASTY Right 10/02/2022   Procedure: RIGHT TOTAL KNEE ARTHROPLASTY;  Surgeon: Mcarthur Rossetti, MD;  Location: [facility];  Service: Orthopedics;  Laterality: Right;    Allergies  Allergies   Allergen Reactions   Tramadol Itching    Pt itches the day after taking but can tolerate it   Ephedrine     "My dentist told me to never let anyone give me this"   Ace Inhibitors Cough    History of Present Illness    Debbie MORMAN Ms. Seth has a PMH of hypertension, hyperlipidemia, degenerative arthritis of both knees, and a family history of coronary artery disease.  She is a former smoker.  She underwent coronary CTA which showed a calcium score over 1000 and stenosis that was flow-limiting in the RCA and LAD.  It was felt to be high risk and.  She had a cardiac event monitor which showed normal sinus rhythm.  She underwent cardiac catheterization 07/08/2020 which showed 70% mid-distal left main stenosis.  50% ostial-proximal LAD stenosis and 70% proximal mid LAD stenosis, moderate size diagonal branch was noted off the area of stenosis, first marginal was noted to have 50% stenosis, ostium of RCA had 90% stenosis, and LV EF was 55-60% with normal LVEDP.  Her husband had bypass surgery by Dr. Servando Snare.  She reported a right knee injury from when a dog ran into her which has limited her physical activity.   She discussed coronary artery bypass graft with Dr. [name].  Benefits and risk of the surgery were discussed and she agreed to proceed with Surgery.  Her preoperative carotid duplex showed no significant right ICA stenosis and 40-59% left ICA stenosis.  She underwent successful  CABG times 4 08/05/20.  She was discharged 08/24/2020.  She developed postoperative atrial fibrillation on day 5.  She was discharged on Eliquis and amiodarone.  Blood pressure 110/56 with pulse of 59.   She presented the clinic to 822 for follow-up evaluation stated she felt well.  She continued to notice some swelling in her right lower leg.  She was limited in her physical activity due to her bilateral knee pain.  She reported that she has been washing clothes and doing some cooking.  She did continue to follow her  sternal precautions and was walking a minimal amount.  She also reported that she had intermittent episodes of rapid heartbeat.  EKG  showed normal sinus rhythm 62 bpm.  I will gave her the [name] support stocking sheet, had her elevate her lower extremities when  inactive, gave her the salty 6 sheet, repeated her fasting lipids and LFTs, and planned follow-up with Dr. Martinique in 3 months.   She contacted the nurse triage line on 09/12/2020 and reported that home health nurse had taken her blood pressure.  Her readings were 190/80 and 210/80.  Her pulse was 48 and 54.  Amlodipine 5 mg was added to her medication regimen.  She was instructed to avoid sodium.   She presented to the clinic 10/11/20 for follow-up evaluation stated her blood pressures better controlled with the addition of amlodipine.  However she did note bilateral generalized lower extremity edema.  She reports that she had been trying to maintain a low-sodium diet and had been increasing her physical activity slowly.  She also reported that she had been wearing lower extremity support stockings.  She continued to notice occasional palpitations/flutter.  She reported these were very brief and that she was mainly noticing them in the evening.  She reported that she would start cardiac rehab in April.  I oedered a BMP and CBC  to be drawn the next week.    She was seen in follow-up by Dr. Martinique on 09/03/2022.  During that time she had been off of amiodarone.  She reported she was unable to take rosuvastatin.  She reported that the medication made her sick.  Her husband had been ill and had major abdominal surgery.  This was stressful for her.  She denied angina and dyspnea.  She did note rare palpitations that were mainly apparent at night.  She had followed up with the lipid clinic.  It was recommended that she take atorvastatin 10 mg and if she was unable to tolerate to begin Repatha.  She had been taking Lipitor for 6 months.  She did note some  nausea but was otherwise okay.  She was preparing for TKA and needed preoperative cardiac evaluation.  She had been an active and had gained 8 pounds.  Follow-up was planned for 6 months.  She was admitted to the hospital on 10/10/2022 and discharged on 10/11/2022.  She reported that she had been sleeping and woke up with 8 out of 10 chest discomfort.  The pain was radiating to both of her shoulders.  She indicated that she had not had that type of chest discomfort and several years.  She also noted that her heart was racing.  She reported that she had a history of panic disorder but it also not had a panic attack for several years.  She denied chest pain at the time of evaluation, shortness of breath, and palpitations.  During exam she was noted to have some memory loss  which she was frustrated with.  Her potassium was slightly low at 3.0.  BNP was 185.5.  Her high-sensitivity troponins were 10 and 58.  Her TSH was normal.  Chest x-ray was negative for acute abnormalities.  Her EKG showed A-fib with RVR with a rate of 150.  She was started on IV diltiazem and converted to sinus rhythm with a rate of 58.  She received supplemental potassium and 40 mg of IV furosemide.  Cardiology was consulted.  She reported that she had undergone knee surgery the week prior.  She was maintaining sinus rhythm.  Echocardiogram is pending.  Her metoprolol 12.5 mg twice daily was continued.  Her CHA2DS2-VASc score was noted to be 5 for CAD, HTN, and age x 2, female.  She was placed on apixaban 5 mg daily.  It was felt that her elevated troponins were due to demand ischemia in the setting of atrial fibrillation with RVR.  TSH repeated on 10/10/2022 noted to be 0.326.  Internal medicine notified.   She presents to the clinic today for follow-up evaluation and states she is having more pain than expected with her recent knee surgery.  We reviewed her recent emergency department visit and atrial fibrillation.  She expressed  understanding.  She reports that she has had brief flutters that last for seconds and dissipate on their own without intervention.  Her EKG today shows sinus bradycardia 56 bpm.  We will continue her current medication regimen and plan follow-up in 4 months.  Today she denies chest pain, shortness of breath, lower extremity edema, fatigue, palpitations, melena, hematuria, hemoptysis, diaphoresis, weakness, presyncope, syncope, orthopnea, and PND.    Home Medications    Prior to Admission medications   Medication Sig Start Date End Date Taking? Authorizing Provider  acetaminophen (TYLENOL) 325 MG tablet Take 2 tablets (650 mg total) by mouth every 6 (six) hours as needed. 10/11/22   Mercy Riding, MD  amLODipine (NORVASC) 5 MG tablet TAKE 1 TABLET (5 MG TOTAL) BY MOUTH DAILY. 07/16/22   Martinique, Peter M, MD  apixaban (ELIQUIS) 5 MG TABS tablet Take 1 tablet (5 mg total) by mouth 2 (two) times daily. 10/11/22   Mercy Riding, MD  atorvastatin (LIPITOR) 10 MG tablet TAKE 1 TABLET BY MOUTH EVERY DAY 04/16/22   Martinique, Peter M, MD  Cholecalciferol (VITAMIN D) 50 MCG (2000 UT) tablet Take 2,000 Units by mouth daily.    [provider]  cyanocobalamin 1000 MCG tablet Take 1 tablet (1,000 mcg total) by mouth daily. 10/13/22   Mercy Riding, MD  metoprolol tartrate (LOPRESSOR) 25 MG tablet Take 0.5 tablets (12.5 mg total) by mouth 2 (two) times daily. 10/11/22 04/09/23  Mercy Riding, MD  oxyCODONE (ROXICODONE) 5 MG immediate release tablet Take 1 tablet (5 mg total) by mouth every 6 (six) hours as needed for severe pain. 10/15/22   Mcarthur Rossetti, MD  SODIUM FLUORIDE 5000 PPM 1.1 % PSTE Place 1 Application onto teeth at bedtime. 08/27/22   [provider]  tiZANidine (ZANAFLEX) 2 MG tablet Take 1 tablet (2 mg total) by mouth every 6 (six) hours as needed for muscle spasms. 10/15/22   Mcarthur Rossetti, MD    Family History    Family History  Problem Relation Age of Onset    Cancer Sister    Heart disease Sister    Heart attack Brother    Heart disease Brother        s/p CABG  Heart attack Father    Heart disease Sister    Heart attack Brother    Heart disease Brother    She indicated that her mother is deceased. She indicated that her father is deceased. She indicated that both of her sisters are alive. She indicated that only one of her two brothers is alive.  Social History    Social History   Socioeconomic History   Marital status: Married    Spouse name: Not on file   Number of children: 3   Years of education: Not on file   Highest education level: Not on file  Occupational History   Occupation: Retired  Tobacco Use   Smoking status: Former    Types: Cigarettes    Quit date: 06/13/2014    Years since quitting: 8.3   Smokeless tobacco: Never  Vaping Use   Vaping Use: Former  Substance and Sexual Activity   Alcohol use: Yes    Comment: Occasionally   Drug use: Never   Sexual activity: Not on file  Other Topics Concern   Not on file  Social History Narrative   Not on file   Social Determinants of Health   Financial Resource Strain: Not on file  Food Insecurity: No Food Insecurity (10/09/2022)   Hunger Vital Sign    Worried About Running Out of Food in the Last Year: Never true    [place] in the Last Year: Never true  Transportation Needs: No Transportation Needs (10/09/2022)   PRAPARE - Hydrologist (Medical): No    Lack of Transportation (Non-Medical): No  Physical Activity: Not on file  Stress: Not on file  Social Connections: Not on file  Intimate Partner Violence: Not At Risk (10/09/2022)   Humiliation, Afraid, Rape, and Kick questionnaire    Fear of Current or Ex-Partner: No    Emotionally Abused: No    Physically Abused: No    Sexually Abused: No     Review of Systems    General:  No chills, fever, night sweats or weight changes.  Cardiovascular:  No chest pain, dyspnea on  exertion, edema, orthopnea, palpitations, paroxysmal nocturnal dyspnea. Dermatological: No rash, lesions/masses Respiratory: No cough, dyspnea Urologic: No hematuria, dysuria Abdominal:   No nausea, vomiting, diarrhea, bright red blood per rectum, melena, or hematemesis Neurologic:  No visual changes, wkns, changes in mental status. All other systems reviewed and are otherwise negative except as noted above.  Physical Exam    VS:  BP 122/64 (BP Location: Left Arm, Patient Position: Sitting, Cuff Size: Normal)   Pulse 63   Ht 5\' 5"  (1.651 m)   Wt 150 lb (68 kg)   SpO2 97%   BMI 24.96 kg/m  , BMI Body mass index is 24.96 kg/m. GEN: Well nourished, well developed, in no acute distress. HEENT: normal. Neck: Supple, no JVD, carotid bruits, or masses. Cardiac: RRR, no murmurs, rubs, or gallops. No clubbing, cyanosis, edema.  Radials/DP/PT 2+ and equal bilaterally.  Respiratory:  Respirations regular and unlabored, clear to auscultation bilaterally. GI: Soft, nontender, nondistended, BS + x 4. MS: no deformity or atrophy. Skin: warm and dry, no rash. Neuro:  Strength and sensation are intact. Psych: Normal affect.  Accessory Clinical Findings    Recent Labs: 09/26/2022: ALT 14 10/09/2022: B Natriuretic Peptide 185.5 10/10/2022: TSH 0.326 10/11/2022: BUN 18; Creatinine, Ser 0.77; Hemoglobin 9.8; Magnesium 2.2; Platelets 349; Potassium 3.8; Sodium 139   Recent Lipid Panel    Component  Value Date/Time   CHOL 144 10/09/2022 0125   CHOL 155 09/03/2022 1055   TRIG 84 10/09/2022 0125   HDL 44 10/09/2022 0125   HDL 60 09/03/2022 1055   CHOLHDL 3.3 10/09/2022 0125   VLDL 17 10/09/2022 0125   LDLCALC 83 10/09/2022 0125   LDLCALC 81 09/03/2022 1055         ECG personally reviewed by me today-sinus bradycardia 56 bpm- No acute changes  EKG 09/06/2020 normal sinus rhythm T wave abnormality consider anterior ischemia prolonged QT 62 bpm- No acute changes   Echocardiogram 08/16/18    Study Conclusions   - Left ventricle: The cavity size was normal. There was mild    concentric hypertrophy. Systolic function was normal. The    estimated ejection fraction was in the range of 60% to 65%. Wall    motion was normal; there were no regional wall motion    abnormalities. Doppler parameters are consistent with abnormal    left ventricular relaxation (grade 1 diastolic dysfunction).    There was no evidence of elevated ventricular filling pressure by    Doppler parameters.  - Aortic valve: Valve area (VTI): 2.27 cm^2. Valve area (Vmax): 2.1    cm^2. Valve area (Vmean): 2.24 cm^2.  - Mitral valve: Valve area by pressure half-time: 2.44 cm^2.  - Right ventricle: Systolic function was normal.  - Right atrium: The atrium was normal in size.  - Tricuspid valve: There was no regurgitation.  - Pulmonary arteries: Systolic pressure was within the normal    range.  - Inferior vena cava: The vessel was normal in size.  - Pericardium, extracardiac: There was no pericardial effusion.   Carotid Dopplers 08/11/2018   Summary:  Right Carotid: The extracranial vessels were near-normal with only minimal  wall                 thickening or plaque.   Left Carotid: Velocities in the left ICA are consistent with a 40-59%  stenosis.  Vertebrals:  Right vertebral artery demonstrates antegrade flow. Left  vertebral               - dampened.  Subclavians: Normal flow hemodynamics were seen in bilateral subclavian               arteries.   Right ABI: Resting right ankle-brachial index is within normal range. No  evidence of significant right lower extremity arterial disease.  Left ABI: Resting left ankle-brachial index is within normal range. No  evidence of significant left lower extremity arterial disease.  Right Upper Extremity: Doppler waveforms remain within normal limits with  right radial compression. Doppler waveforms decrease 50% with right ulnar  compression.  Left Upper  Extremity: Doppler waveforms remain within normal limits with  left radial compression. Doppler waveform obliterate with left ulnar  compression.   Echocardiogram 10/10/2022  IMPRESSIONS     1. Left ventricular ejection fraction, by estimation, is 60 to 65%. The  left ventricle has normal function. The left ventricle has no regional  wall motion abnormalities. Left ventricular diastolic parameters were  grossly normal.   2. Right ventricular systolic function is normal. The right ventricular  size is normal. Tricuspid regurgitation signal is inadequate for assessing  PA pressure.   3. The mitral valve is normal in structure. Trivial mitral valve  regurgitation. No evidence of mitral stenosis.   4. The aortic valve is normal in structure. Aortic valve regurgitation is  not visualized. No aortic stenosis is present.  5. The inferior vena cava is dilated in size with >50% respiratory  variability, suggesting right atrial pressure of 8 mmHg.   FINDINGS   Left Ventricle: Left ventricular ejection fraction, by estimation, is 60  to 65%. The left ventricle has normal function. The left ventricle has no  regional wall motion abnormalities. The left ventricular internal cavity  size was normal in size. There is   no left ventricular hypertrophy. Abnormal (paradoxical) septal motion  consistent with post-operative status. Left ventricular diastolic  parameters were normal.   Right Ventricle: The right ventricular size is normal. No increase in  right ventricular wall thickness. Right ventricular systolic function is  normal. Tricuspid regurgitation signal is inadequate for assessing PA  pressure.   Left Atrium: Left atrial size was normal in size.   Right Atrium: Right atrial size was normal in size.   Pericardium: There is no evidence of pericardial effusion.   Mitral Valve: The mitral valve is normal in structure. Trivial mitral  valve regurgitation. No evidence of mitral valve  stenosis.   Tricuspid Valve: The tricuspid valve is normal in structure. Tricuspid  valve regurgitation is trivial. No evidence of tricuspid stenosis.   Aortic Valve: The aortic valve is normal in structure. Aortic valve  regurgitation is not visualized. No aortic stenosis is present. Aortic  valve mean gradient measures 7.0 mmHg. Aortic valve peak gradient measures  12.8 mmHg. Aortic valve area, by VTI  measures 2.70 cm.   Pulmonic Valve: The pulmonic valve was normal in structure. Pulmonic valve  regurgitation is trivial. No evidence of pulmonic stenosis.   Aorta: The aortic root is normal in size and structure.   Venous: The inferior vena cava is dilated in size with greater than 50%  respiratory variability, suggesting right atrial pressure of 8 mmHg.   IAS/Shunts: No atrial level shunt detected by color flow Doppler.    Assessment & Plan   1.  Atrial fibrillation-EKG today shows sinus bradycardia.  Reports compliance with Eliquis.  Denies bleeding issues.   Presented to the emergency department with chest discomfort and found to be in atrial fibrillation with RVR at a rate of 150.  She was 1 week post TKR.  Converted on IV diltiazem.  CHA2DS2-VASc score 5 (CAD, HTN, age x 2, female).  TSH was low at 0.326 on 10/10/2022.  (Managed by PCP) echocardiogram 10/10/2022 showed normal LVEF trivial mitral valve regurgitation and no other significant abnormalities. Continue  apixaban, metoprolol Heart healthy low-sodium diet-salty 6 given Increase physical activity as tolerated   Essential hypertension-BP today 122/64   Continue metoprolol, amlodipine Heart healthy low-sodium diet-salty 6 reviewed Increase physical activity as tolerated Maintain blood pressure log    Coronary artery disease, demand ischemia-no chest pain today.  Noted to have elevated troponins due to demand ischemia in the setting of A-fib RVR.  Post CABG x4 by Dr. [name] on 08/15/2020.   Continue aspirin, metoprolol,  atorvastatin Heart healthy low-sodium diet-salty 6 given Increase physical activity as tolerated, maintain sternal precautions.   Hyperlipidemia-LDL 83 on 10/09/22. Continue atorvastatin, aspirin Heart healthy low-sodium high-fiber diet Increase physical activity as tolerated   Carotid artery stenosis-carotid Doppler showed normal right carotid artery with left 40-59% carotid stenosis. Continue rosuvastatin, aspirin Repeat carotid Dopplers 1/25   Altered mental status-patient alert and oriented x 4.  Neurologic fully intact.  Was previously noted to have confusion during hospital admission.  Disposition: Follow-up with Dr. Martinique in 4 months.   Jossie Ng. Alonzo Loving NP-C  10/18/2022, 11:12 AM [address] Office [phone] Fax [phone]    I spent 14 minutes examining this patient, reviewing medications, and using patient centered shared decision making involving her cardiac care.  Prior to her visit I spent greater than 20 minutes reviewing her past medical history,  medications, and prior cardiac tests.

## 2022-10-17 ENCOUNTER — Ambulatory Visit (INDEPENDENT_AMBULATORY_CARE_PROVIDER_SITE_OTHER): Payer: HMO | Admitting: Rehabilitative and Restorative Service Providers"

## 2022-10-17 ENCOUNTER — Encounter: Payer: Self-pay | Admitting: Rehabilitative and Restorative Service Providers"

## 2022-10-17 DIAGNOSIS — M25661 Stiffness of right knee, not elsewhere classified: Secondary | ICD-10-CM

## 2022-10-17 DIAGNOSIS — R600 Localized edema: Secondary | ICD-10-CM

## 2022-10-17 DIAGNOSIS — R262 Difficulty in walking, not elsewhere classified: Secondary | ICD-10-CM | POA: Diagnosis not present

## 2022-10-17 DIAGNOSIS — M6281 Muscle weakness (generalized): Secondary | ICD-10-CM

## 2022-10-17 DIAGNOSIS — M25561 Pain in right knee: Secondary | ICD-10-CM

## 2022-10-17 NOTE — Therapy (Signed)
OUTPATIENT PHYSICAL THERAPY LOWER EXTREMITY EVALUATION   Patient Name: Debbie Cardenas MRN: [id number] DOB:Jun 29, 1946, 77 y.o., female Today's Date: 10/17/2022  END OF SESSION:  PT End of Session - 10/17/22 1733     Visit Number 1    Date for PT Re-Evaluation 12/12/22    Progress Note Due on Visit 10    PT Start Time 1515    PT Stop Time 1602    PT Time Calculation (min) 47 min    Activity Tolerance Patient tolerated treatment well;No increased pain;Patient limited by pain    Behavior During Therapy [facility] for tasks assessed/performed             Past Medical History:  Diagnosis Date   Anxiety    Arthritis    knees   Closed fracture of neck of left femur ([place]) 08/15/2018   Coronary artery disease    Hyperlipidemia    Hypertension    Pneumonia    Past Surgical History:  Procedure Laterality Date   APPENDECTOMY     CARDIAC CATHETERIZATION     CATARACT EXTRACTION, BILATERAL     CHOLECYSTECTOMY     CORONARY ARTERY BYPASS GRAFT N/A 08/15/2020   Procedure: CORONARY ARTERY BYPASS GRAFTING (CABG)X4. USING BILATERAL MAMMARY ARTERIES AND RIGHT GREATER SAPHENOUS VEIN HARVESTED ENDOSCOPICALLY.;  Surgeon: Gaye Pollack, MD;  Location: [place] [facility];  Service: Open Heart Surgery;  Laterality: N/A;   EYE SURGERY     bilateral cataract removal   KNEE SURGERY     LEFT HEART CATH AND CORONARY ANGIOGRAPHY N/A 07/27/2020   Procedure: LEFT HEART CATH AND CORONARY ANGIOGRAPHY;  Surgeon: Martinique, Peter M, MD;  Location: [place] [facility];  Service: Cardiovascular;  Laterality: N/A;   TEE WITHOUT CARDIOVERSION N/A 08/15/2020   Procedure: TRANSESOPHAGEAL ECHOCARDIOGRAM (TEE);  Surgeon: Gaye Pollack, MD;  Location: [place];  Service: Open Heart Surgery;  Laterality: N/A;   TOTAL HIP ARTHROPLASTY Left 08/15/2018   Procedure: TOTAL HIP ARTHROPLASTY ANTERIOR APPROACH;  Surgeon: Mcarthur Rossetti, MD;  [facility];  Service: Orthopedics;  Laterality: Left;   TOTAL KNEE ARTHROPLASTY Right  10/02/2022   Procedure: RIGHT TOTAL KNEE ARTHROPLASTY;  Surgeon: Mcarthur Rossetti, MD;  Location: [place];  Service: Orthopedics;  Laterality: Right;   Patient Active Problem List   Diagnosis Date Noted   Delirium 10/10/2022   Atrial fibrillation with RVR (HCC) 10/09/2022   Chest pain 10/09/2022   Elevated troponin 10/09/2022   Pulmonary edema 10/09/2022   Hypokalemia 10/09/2022   Normocytic anemia 10/09/2022   Memory loss 10/09/2022   Status post total right knee replacement 10/02/2022   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   Coronary artery disease 08/15/2020   Coronary artery 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 ([place]) 07/27/2020   HTN (hypertension) 07/27/2020   Hyperlipidemia 07/27/2020   Unilateral primary osteoarthritis, left knee 03/26/2019   Status post total replacement of left hip 08/28/2018   Hip fracture ([place]) 08/15/2018   Closed fracture of neck of left femur ([place]) 08/15/2018    PCP: Daphane Shepherd, PA  REFERRING PROVIDER: Mcarthur Rossetti, MD  REFERRING DIAG:  Diagnosis  [phone] (ICD-10-CM) - Status post total right knee replacement    THERAPY DIAG:  Difficulty in walking, not elsewhere classified - Plan: PT plan of care cert/re-cert  Muscle weakness (generalized) - Plan: PT plan of care cert/re-cert  Localized edema - Plan: PT plan of care cert/re-cert  Stiffness of right knee, not elsewhere classified -  Plan: PT plan of care cert/re-cert  Right knee pain, unspecified chronicity - Plan: PT plan of care cert/re-cert  Rationale for Evaluation and Treatment: Rehabilitation  ONSET DATE: October 02, 2022  SUBJECTIVE:   SUBJECTIVE STATEMENT: March 5th surgery for right TKA.  [name] had a bad reaction to some medications and had to go back to the hospital for 3 days due to the bad medication reactions.  She was unable to do any exercises while in the hospital and presents today in need of outpatient physical therapy status  post right TKA.  PERTINENT HISTORY: CABG x 4, previous left femur fracture in 2020, HTN, HLD, OA, anxiety, left THA in 2020  PAIN:  Are you having pain? Yes: NPRS scale: 4-8/10 Pain location: Right knee Pain description: Ache, tight, sore and throbbing Aggravating factors: Sleeping, prolonged postures and hurts all the time Relieving factors: Ice and exercise  PRECAUTIONS: None  WEIGHT BEARING RESTRICTIONS: No  FALLS:  Has patient fallen in last 6 months? No  LIVING ENVIRONMENT: Lives with: lives with their family and lives with their spouse Lives in: House/apartment Stairs:  Does OK with handrail Has following equipment at home: Single point cane, Environmental consultant - 2 wheeled, and Grab bars  OCCUPATION: Retired, return to going to concerts  PLOF: [place]: Return to normal household activities without pain or use of an assistive device.  NEXT MD VISIT: 6 months  OBJECTIVE:   DIAGNOSTIC FINDINGS: 2 views of the left knee show tricompartment arthritis with bone-on-bone  wear of all 3 compartments.  There are large osteophytes in all 3  compartments and significant joint space narrowing.  2 views of the right knee show severe tricompartment arthritis.  There are  osteophytes in all 3 compartments, a mild effusion and severe joint space  narrowing.  PATIENT SURVEYS:  FOTO Check visit 2  COGNITION: Overall cognitive status: Within functional limits for tasks assessed     SENSATION: No complaints of peripheral pain or paresthesias  EDEMA: Noted and not objectively assessed.  LOWER EXTREMITY ROM:  Active ROM in degrees Left/Right 10/17/2022   Hip flexion    Hip extension    Hip abduction    Hip adduction    Hip internal rotation    Hip external rotation    Knee flexion 112/63   Knee extension 0/-5   Ankle dorsiflexion    Ankle plantarflexion    Ankle inversion    Ankle eversion     (Blank rows = not tested)  LOWER EXTREMITY STRENGTH:  STRENGTH  IN POUNDS Left/Right 10/17/2022   Hip flexion    Hip extension    Hip abduction    Hip adduction    Hip internal rotation    Hip external rotation    Knee flexion    Knee extension 36.9/18.3   Ankle dorsiflexion    Ankle plantarflexion    Ankle inversion    Ankle eversion     (Blank rows = not tested)   GAIT: Distance walked: Within the clinic Assistive device utilized: Walker - 2 wheeled Level of assistance: Complete Independence Comments: Yukie was assistive device free before surgery   TODAY'S TREATMENT:  DATE: 10/17/2022 Quadriceps sets with right heel prop 10 x 5 seconds Tailgate knee flexion 1 minute Seated AAROM knee flexion (left pushes right into flexion) 5X 10 seconds  Vaso right knee 10 minutes Medium 34*   PATIENT EDUCATION:  Education details: Discussed the importance of returning full knee extension AROM, flexion to 110 degrees, controlling edema and improving quadriceps strength before advancing into gait, balance and functional activities Person educated: Patient and Spouse Education method: Explanation, Demonstration, Tactile cues, Verbal cues, and Handouts Education comprehension: verbalized understanding, returned demonstration, verbal cues required, tactile cues required, and needs further education  HOME EXERCISE PROGRAM: Access Code: [email or web address] URL: [email or web address] Date: 10/17/2022 Prepared by: Vista Mink  Exercises - Seated Knee Flexion AAROM  - 5 x daily - 7 x weekly - 1 sets - 1 reps - 3 minutes hold - Supine Quadricep Sets  - 5 x daily - 7 x weekly - 2 sets - 10 reps - 5 second hold  ASSESSMENT:  CLINICAL IMPRESSION: Patient is a 77 y.o. female who was seen today for physical therapy evaluation and treatment for status post right knee TKA.  Achante has limited AROM, quadricep strength, increased  right knee edema and requires use of a wheeled walker with gait.  Early emphasis of rehabilitation will be to restore normal joint AROM from 0 to 110 degrees and improve quadricep strength to the point she is safe without an assistive device.  Her prognosis to meet the below listed goals is good with supervised physical therapy.  OBJECTIVE IMPAIRMENTS: Abnormal gait, decreased activity tolerance, decreased endurance, decreased knowledge of condition, difficulty walking, decreased ROM, decreased strength, increased edema, and pain.   ACTIVITY LIMITATIONS: bending, sitting, standing, squatting, sleeping, stairs, and locomotion level  PARTICIPATION LIMITATIONS: cleaning, driving, shopping, and community activity  PERSONAL FACTORS: CABG x 4, previous left femur fracture in 2020, HTN, HLD, OA, anxiety, left THA in 2020 are also affecting patient's functional outcome.   REHAB POTENTIAL: Good  CLINICAL DECISION MAKING: Stable/uncomplicated  EVALUATION COMPLEXITY: Low   GOALS: Goals reviewed with patient? Yes  SHORT TERM GOALS: Target date: 11/14/2022 [name] will improve her right knee AROM to 2-0-90 degrees Baseline: 5-0-63 degrees Goal status: INITIAL  2.  [name] will be independent with her day 1 home exercise program Baseline: Started 10/17/2022 Goal status: INITIAL   LONG TERM GOALS: Target date: 12/12/2022  [name] will improve her right knee AROM to 0-110 degrees Baseline: 5-0-63 degrees Goal status: INITIAL  2.  Improve right quadriceps strength to at least 40 pounds Baseline: 18 pounds Goal status: INITIAL  3.  Deshawnda will be independent and comfortable without the use of an assistive device Baseline: Using a wheeled walker at evaluation Goal status: INITIAL  4.  Improve FOTO to recommended value by discharge Baseline: Will be completed visit 2 Goal status: INITIAL  5.  [name] will be independent and compliant with a long-term home exercise program at discharge Baseline: Started  10/17/2022 Goal status: INITIAL   PLAN:  PT FREQUENCY: 2-3x/week  PT DURATION: 8 weeks  PLANNED INTERVENTIONS: Therapeutic exercises, Therapeutic activity, Neuromuscular re-education, Balance training, Gait training, Patient/Family education, Self Care, Joint mobilization, Stair training, Cryotherapy, Vasopneumatic device, and Manual therapy  PLAN FOR NEXT SESSION: Review day 1 home exercise program and continue to progress active range of motion for both flexion and extension, quadriceps strength and continue to manage edema.   Farley Ly, PT, MPT 10/17/2022, 5:52 PM

## 2022-10-18 ENCOUNTER — Ambulatory Visit: Payer: HMO | Attending: General Practice | Admitting: General Practice

## 2022-10-18 ENCOUNTER — Encounter: Payer: Self-pay | Admitting: General Practice

## 2022-10-18 VITALS — BP 122/64 | HR 63 | Ht 65.0 in | Wt 150.0 lb

## 2022-10-18 DIAGNOSIS — E7800 Pure hypercholesterolemia, unspecified: Secondary | ICD-10-CM

## 2022-10-18 DIAGNOSIS — I4891 Unspecified atrial fibrillation: Secondary | ICD-10-CM | POA: Diagnosis not present

## 2022-10-18 DIAGNOSIS — I10 Essential (primary) hypertension: Secondary | ICD-10-CM | POA: Diagnosis not present

## 2022-10-18 DIAGNOSIS — I2510 Atherosclerotic heart disease of native coronary artery without angina pectoris: Secondary | ICD-10-CM

## 2022-10-18 DIAGNOSIS — I6522 Occlusion and stenosis of left carotid artery: Secondary | ICD-10-CM | POA: Diagnosis not present

## 2022-10-18 NOTE — Patient Instructions (Signed)
Medication Instructions:  The current medical regimen is effective;  continue present plan and medications as directed. Please refer to the Current Medication list given to you today.  *If you need a refill on your cardiac medications before your next appointment, please call your pharmacy*  Lab Work: NONE If you have labs (blood work) drawn today and your tests are completely normal, you will receive your results only by: [place] (if you have MyChart) OR  A paper copy in the mail If you have any lab test that is abnormal or we need to change your treatment, we will call you to review the results.   Testing/Procedures: NONE   Follow-Up: At [facility], you and your health needs are our priority.  As part of our continuing mission to provide you with exceptional heart care, we have created designated Provider Care Teams.  These Care Teams include your primary Cardiologist (physician) and Advanced Practice Providers (APPs -  Physician Assistants and Nurse Practitioners) who all work together to provide you with the care you need, when you need it.  We recommend signing up for the patient portal called "MyChart".  Sign up information is provided on this After Visit Summary.  MyChart is used to connect with patients for Virtual Visits (Telemedicine).  Patients are able to view lab/test results, encounter notes, upcoming appointments, etc.  Non-urgent messages can be sent to your provider as well.   To learn more about what you can do with MyChart, go to [email or web address].    Your next appointment:   4 month(s)  Provider:   Peter Martinique, MD  or Coletta Memos, FNP        Other Instructions

## 2022-10-22 ENCOUNTER — Ambulatory Visit (INDEPENDENT_AMBULATORY_CARE_PROVIDER_SITE_OTHER): Payer: HMO | Admitting: Physical Therapy

## 2022-10-22 DIAGNOSIS — R262 Difficulty in walking, not elsewhere classified: Secondary | ICD-10-CM

## 2022-10-22 DIAGNOSIS — M25661 Stiffness of right knee, not elsewhere classified: Secondary | ICD-10-CM

## 2022-10-22 DIAGNOSIS — M25561 Pain in right knee: Secondary | ICD-10-CM

## 2022-10-22 DIAGNOSIS — R600 Localized edema: Secondary | ICD-10-CM | POA: Diagnosis not present

## 2022-10-22 DIAGNOSIS — M6281 Muscle weakness (generalized): Secondary | ICD-10-CM | POA: Diagnosis not present

## 2022-10-22 NOTE — Therapy (Signed)
OUTPATIENT PHYSICAL THERAPY TREATMENT  Patient Name: Debbie Cardenas MRN: [id number] DOB:[birth year], 77 y.o., female Today's Date: 10/22/2022  END OF SESSION:  PT End of Session - 10/22/22 1606     Visit Number 2    Date for PT Re-Evaluation 12/12/22    Progress Note Due on Visit 10    PT Start Time 1600    PT Stop Time 1645    PT Time Calculation (min) 45 min    Activity Tolerance Patient tolerated treatment well    Behavior During Therapy [facility] for tasks assessed/performed              Past Medical History:  Diagnosis Date   Anxiety    Arthritis    knees   Closed fracture of neck of left femur ([place]) 08/15/2018   Coronary artery disease    Hyperlipidemia    Hypertension    Pneumonia    Past Surgical History:  Procedure Laterality Date   APPENDECTOMY     CARDIAC CATHETERIZATION     CATARACT EXTRACTION, BILATERAL     CHOLECYSTECTOMY     CORONARY ARTERY BYPASS GRAFT N/A 08/15/2020   Procedure: CORONARY ARTERY BYPASS GRAFTING (CABG)X4. USING BILATERAL MAMMARY ARTERIES AND RIGHT GREATER SAPHENOUS VEIN HARVESTED ENDOSCOPICALLY.;  Surgeon: Gaye Pollack, MD;  Location: [place] [facility];  Service: Open Heart Surgery;  Laterality: N/A;   EYE SURGERY     bilateral cataract removal   KNEE SURGERY     LEFT HEART CATH AND CORONARY ANGIOGRAPHY N/A 07/27/2020   Procedure: LEFT HEART CATH AND CORONARY ANGIOGRAPHY;  Surgeon: Martinique, Peter M, MD;  Location: [place] [facility];  Service: Cardiovascular;  Laterality: N/A;   TEE WITHOUT CARDIOVERSION N/A 08/15/2020   Procedure: TRANSESOPHAGEAL ECHOCARDIOGRAM (TEE);  Surgeon: Gaye Pollack, MD;  Location: [place];  Service: Open Heart Surgery;  Laterality: N/A;   TOTAL HIP ARTHROPLASTY Left 08/15/2018   Procedure: TOTAL HIP ARTHROPLASTY ANTERIOR APPROACH;  Surgeon: Mcarthur Rossetti, MD;  [facility];  Service: Orthopedics;  Laterality: Left;   TOTAL KNEE ARTHROPLASTY Right 10/02/2022   Procedure: RIGHT TOTAL KNEE ARTHROPLASTY;   Surgeon: Mcarthur Rossetti, MD;  Location: [place];  Service: Orthopedics;  Laterality: Right;   Patient Active Problem List   Diagnosis Date Noted   Delirium 10/10/2022   Atrial fibrillation with RVR (HCC) 10/09/2022   Chest pain 10/09/2022   Elevated troponin 10/09/2022   Pulmonary edema 10/09/2022   Hypokalemia 10/09/2022   Normocytic anemia 10/09/2022   Memory loss 10/09/2022   Status post total right knee replacement 10/02/2022   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   Coronary artery disease 08/15/2020   Coronary artery 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 ([place]) 07/27/2020   HTN (hypertension) 07/27/2020   Hyperlipidemia 07/27/2020   Unilateral primary osteoarthritis, left knee 03/26/2019   Status post total replacement of left hip 08/28/2018   Hip fracture ([place]) 08/15/2018   Closed fracture of neck of left femur ([place]) 08/15/2018    PCP: Daphane Shepherd, PA  REFERRING PROVIDER: Mcarthur Rossetti, MD  REFERRING DIAG:  Diagnosis  [phone] (ICD-10-CM) - Status post total right knee replacement    THERAPY DIAG:  Difficulty in walking, not elsewhere classified  Muscle weakness (generalized)  Localized edema  Stiffness of right knee, not elsewhere classified  Right knee pain, unspecified chronicity  Rationale for Evaluation and Treatment: Rehabilitation  ONSET DATE: October 02, 2022  SUBJECTIVE:   SUBJECTIVE STATEMENT: States pain is somewhere  in the middle today  PERTINENT HISTORY: CABG x 4, previous left femur fracture in 2020, HTN, HLD, OA, anxiety, left THA in 2020  PAIN:  Are you having pain? Yes: NPRS scale: 4-5/10 Pain location: Right knee Pain description: Ache, tight, sore and throbbing Aggravating factors: Sleeping, prolonged postures and hurts all the time Relieving factors: Ice and exercise  PRECAUTIONS: None  WEIGHT BEARING RESTRICTIONS: No  FALLS:  Has patient fallen in last 6 months? No  LIVING  ENVIRONMENT: Lives with: lives with their family and lives with their spouse Lives in: House/apartment Stairs:  Does OK with handrail Has following equipment at home: Single point cane, Environmental consultant - 2 wheeled, and Grab bars  OCCUPATION: Retired, return to going to concerts  PLOF: [place]: Return to normal household activities without pain or use of an assistive device.  NEXT MD VISIT: 6 months  OBJECTIVE:   DIAGNOSTIC FINDINGS: 2 views of the left knee show tricompartment arthritis with bone-on-bone  wear of all 3 compartments.  There are large osteophytes in all 3  compartments and significant joint space narrowing.  2 views of the right knee show severe tricompartment arthritis.  There are  osteophytes in all 3 compartments, a mild effusion and severe joint space  narrowing.  PATIENT SURVEYS:  FOTO Check visit 2  COGNITION: Overall cognitive status: Within functional limits for tasks assessed     SENSATION: No complaints of peripheral pain or paresthesias  EDEMA: Noted and not objectively assessed.  LOWER EXTREMITY ROM:  Active ROM in degrees Left/Right 10/17/2022   Hip flexion    Hip extension    Hip abduction    Hip adduction    Hip internal rotation    Hip external rotation    Knee flexion 112/63   Knee extension 0/-5   Ankle dorsiflexion    Ankle plantarflexion    Ankle inversion    Ankle eversion     (Blank rows = not tested)  LOWER EXTREMITY STRENGTH:  STRENGTH IN POUNDS Left/Right 10/17/2022   Hip flexion    Hip extension    Hip abduction    Hip adduction    Hip internal rotation    Hip external rotation    Knee flexion    Knee extension 36.9/18.3   Ankle dorsiflexion    Ankle plantarflexion    Ankle inversion    Ankle eversion     (Blank rows = not tested)   GAIT: Distance walked: Within the clinic Assistive device utilized: Walker - 2 wheeled Level of assistance: Complete Independence Comments: Debbie Cardenas was assistive  device free before surgery   TODAY'S TREATMENT:                                                                                                                              DATE:  10/22/22 Nu step L5 X 5 min seat #5 Standing gastroc stretch 2 X 30 sec Standing heel and toe raises X  15 bilat Step ups onto 4 inch step leading with Rt X 10 reps with one UE support Seated knee flexion AAROM stretch 5 sec X 10 Seated LAQ 2-3 sec hold X 10 Sit to stands with UE support, slow lower X 10 Supine quad set 5 sec X 10  Manual: Rt knee PROM to tolerance with overpressure  -Vasopnuematic device X 10 min, low compression, 34 deg to Rt knee   10/17/2022 Quadriceps sets with right heel prop 10 x 5 seconds Tailgate knee flexion 1 minute Seated AAROM knee flexion (left pushes right into flexion) 5X 10 seconds  Vaso right knee 10 minutes Medium 34*   PATIENT EDUCATION:  Education details: Discussed the importance of returning full knee extension AROM, flexion to 110 degrees, controlling edema and improving quadriceps strength before advancing into gait, balance and functional activities Person educated: Patient and Spouse Education method: Explanation, Demonstration, Tactile cues, Verbal cues, and Handouts Education comprehension: verbalized understanding, returned demonstration, verbal cues required, tactile cues required, and needs further education  HOME EXERCISE PROGRAM: Access Code: [email or web address] URL: [email or web address] Date: 10/17/2022 Prepared by: Vista Mink  Exercises - Seated Knee Flexion AAROM  - 5 x daily - 7 x weekly - 1 sets - 1 reps - 3 minutes hold - Supine Quadricep Sets  - 5 x daily - 7 x weekly - 2 sets - 10 reps - 5 second hold  ASSESSMENT:  CLINICAL IMPRESSION: Session focused on HEP review and she does need several cues for technique, how long to hold, and how many reps as she states she "forgets what she is supposed to be doing". We worked to improve her  Rt knee ROM and strength as tolerated and she will continue to benefit from skilled PT.  OBJECTIVE IMPAIRMENTS: Abnormal gait, decreased activity tolerance, decreased endurance, decreased knowledge of condition, difficulty walking, decreased ROM, decreased strength, increased edema, and pain.   ACTIVITY LIMITATIONS: bending, sitting, standing, squatting, sleeping, stairs, and locomotion level  PARTICIPATION LIMITATIONS: cleaning, driving, shopping, and community activity  PERSONAL FACTORS: CABG x 4, previous left femur fracture in 2020, HTN, HLD, OA, anxiety, left THA in 2020 are also affecting patient's functional outcome.   REHAB POTENTIAL: Good  CLINICAL DECISION MAKING: Stable/uncomplicated  EVALUATION COMPLEXITY: Low   GOALS: Goals reviewed with patient? Yes  SHORT TERM GOALS: Target date: 11/14/2022 [name] will improve her right knee AROM to 2-0-90 degrees Baseline: 5-0-63 degrees Goal status: INITIAL  2.  [name] will be independent with her day 1 home exercise program Baseline: Started 10/17/2022 Goal status: INITIAL   LONG TERM GOALS: Target date: 12/12/2022  [name] will improve her right knee AROM to 0-110 degrees Baseline: 5-0-63 degrees Goal status: INITIAL  2.  Improve right quadriceps strength to at least 40 pounds Baseline: 18 pounds Goal status: INITIAL  3.  [place]enne will be independent and comfortable without the use of an assistive device Baseline: Using a wheeled walker at evaluation Goal status: INITIAL  4.  Improve FOTO to recommended value by discharge Baseline: Will be completed visit 2 Goal status: INITIAL  5.  [name] will be independent and compliant with a long-term home exercise program at discharge Baseline: Started 10/17/2022 Goal status: INITIAL   PLAN:  PT FREQUENCY: 2-3x/week  PT DURATION: 8 weeks  PLANNED INTERVENTIONS: Therapeutic exercises, Therapeutic activity, Neuromuscular re-education, Balance training, Gait training, Patient/Family  education, Self Care, Joint mobilization, Stair training, Cryotherapy, Vasopneumatic device, and Manual therapy  PLAN FOR NEXT SESSION: continue to review home exercise program and  continue to progress active range of motion for both flexion and extension, quadriceps strength and continue to manage edema.   Elsie Ra, PT, DPT 10/22/22 4:06 PM

## 2022-10-23 NOTE — Addendum Note (Signed)
Addended by: Waylan Rocher on: 10/23/2022 07:20 AM   Modules accepted: Orders

## 2022-10-25 ENCOUNTER — Encounter: Payer: Self-pay | Admitting: Rehabilitative and Restorative Service Providers"

## 2022-10-25 ENCOUNTER — Telehealth: Payer: Self-pay | Admitting: Orthopaedic Surgery

## 2022-10-25 ENCOUNTER — Ambulatory Visit (INDEPENDENT_AMBULATORY_CARE_PROVIDER_SITE_OTHER): Payer: HMO | Admitting: Rehabilitative and Restorative Service Providers"

## 2022-10-25 ENCOUNTER — Other Ambulatory Visit: Payer: Self-pay | Admitting: Orthopaedic Surgery

## 2022-10-25 DIAGNOSIS — M25661 Stiffness of right knee, not elsewhere classified: Secondary | ICD-10-CM | POA: Diagnosis not present

## 2022-10-25 DIAGNOSIS — R600 Localized edema: Secondary | ICD-10-CM | POA: Diagnosis not present

## 2022-10-25 DIAGNOSIS — M25561 Pain in right knee: Secondary | ICD-10-CM

## 2022-10-25 DIAGNOSIS — R262 Difficulty in walking, not elsewhere classified: Secondary | ICD-10-CM

## 2022-10-25 DIAGNOSIS — M6281 Muscle weakness (generalized): Secondary | ICD-10-CM | POA: Diagnosis not present

## 2022-10-25 MED ORDER — OXYCODONE HCL 5 MG PO TABS: 5.0000 mg | ORAL_TABLET | Freq: Four times a day (QID) | ORAL | 0 refills | Status: DC | PRN

## 2022-10-25 NOTE — Telephone Encounter (Signed)
Patient called in requesting hydrocodone refill please advise Post op TKA 10/02/22

## 2022-10-25 NOTE — Therapy (Signed)
OUTPATIENT PHYSICAL THERAPY TREATMENT  Patient Name: Debbie Cardenas MRN: [id number] DOB:Jan 20, 1946, 77 y.o., female Today's Date: 10/25/2022  END OF SESSION:  PT End of Session - 10/25/22 1544     Visit Number 3    Date for PT Re-Evaluation 12/12/22    Authorization Type Health Team $10 copay    Progress Note Due on Visit 10    PT Start Time [id number]    PT Stop Time 1633    PT Time Calculation (min) 49 min    Activity Tolerance Patient tolerated treatment well    Behavior During Therapy [facility] for tasks assessed/performed               Past Medical History:  Diagnosis Date   Anxiety    Arthritis    knees   Closed fracture of neck of left femur ([place]) 08/15/2018   Coronary artery disease    Hyperlipidemia    Hypertension    Pneumonia    Past Surgical History:  Procedure Laterality Date   APPENDECTOMY     CARDIAC CATHETERIZATION     CATARACT EXTRACTION, BILATERAL     CHOLECYSTECTOMY     CORONARY ARTERY BYPASS GRAFT N/A 08/15/2020   Procedure: CORONARY ARTERY BYPASS GRAFTING (CABG)X4. USING BILATERAL MAMMARY ARTERIES AND RIGHT GREATER SAPHENOUS VEIN HARVESTED ENDOSCOPICALLY.;  Surgeon: Gaye Pollack, MD;  Location: [place] [facility];  Service: Open Heart Surgery;  Laterality: N/A;   EYE SURGERY     bilateral cataract removal   KNEE SURGERY     LEFT HEART CATH AND CORONARY ANGIOGRAPHY N/A 07/27/2020   Procedure: LEFT HEART CATH AND CORONARY ANGIOGRAPHY;  Surgeon: Martinique, Peter M, MD;  Location: [place] [facility];  Service: Cardiovascular;  Laterality: N/A;   TEE WITHOUT CARDIOVERSION N/A 08/15/2020   Procedure: TRANSESOPHAGEAL ECHOCARDIOGRAM (TEE);  Surgeon: Gaye Pollack, MD;  Location: [place];  Service: Open Heart Surgery;  Laterality: N/A;   TOTAL HIP ARTHROPLASTY Left 08/15/2018   Procedure: TOTAL HIP ARTHROPLASTY ANTERIOR APPROACH;  Surgeon: Mcarthur Rossetti, MD;  Location[facility];  Service: Orthopedics;  Laterality: Left;   TOTAL KNEE ARTHROPLASTY Right 10/02/2022    Procedure: RIGHT TOTAL KNEE ARTHROPLASTY;  Surgeon: Mcarthur Rossetti, MD;  Location: [place];  Service: Orthopedics;  Laterality: Right;   Patient Active Problem List   Diagnosis Date Noted   Delirium 10/10/2022   Atrial fibrillation with RVR (H[facility]) 10/09/2022   Chest pain 10/09/2022   Elevated troponin 10/09/2022   Pulmonary edema 10/09/2022   Hypokalemia 10/09/2022   Normocytic anemia 10/09/2022   Memory loss 10/09/2022   Status post total right knee replacement 10/02/2022   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   Coronary artery disease 08/15/2020   Coronary artery 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 ([place]) 07/27/2020   HTN (hypertension) 07/27/2020   Hyperlipidemia 07/27/2020   Unilateral primary osteoarthritis, left knee 03/26/2019   Status post total replacement of left hip 08/28/2018   Hip fracture ([place]) 08/15/2018   Closed fracture of neck of left femur ([place]) 08/15/2018    PCP: Daphane Shepherd, PA  REFERRING PROVIDER: Mcarthur Rossetti, MD  REFERRING DIAG:  Diagnosis  [phone] (ICD-10-CM) - Status post total right knee replacement    THERAPY DIAG:  Difficulty in walking, not elsewhere classified  Muscle weakness (generalized)  Localized edema  Stiffness of right knee, not elsewhere classified  Right knee pain, unspecified chronicity  Rationale for Evaluation and Treatment: Rehabilitation  ONSET DATE: October 02, 2022  SUBJECTIVE:   SUBJECTIVE STATEMENT: She indicated 4/10 consistent all day and at night.  Stiffness  PERTINENT HISTORY: CABG x 4, previous left femur fracture in 2020, HTN, HLD, OA, anxiety, left THA in 2020  PAIN:  NPRS scale: 4/10 Pain location: Right knee Pain description: Ache, tight, sore and throbbing Aggravating factors: Sleeping, prolonged postures and hurts all the time Relieving factors: Ice and exercise  PRECAUTIONS: None  WEIGHT BEARING RESTRICTIONS: No  FALLS:  Has patient fallen in  last 6 months? No  LIVING ENVIRONMENT: Lives with: lives with their family and lives with their spouse Lives in: House/apartment Stairs:  Does OK with handrail Has following equipment at home: Single point cane, Environmental consultant - 2 wheeled, and Grab bars  OCCUPATION: Retired, return to going to concerts  PLOF: [place]: Return to normal household activities without pain or use of an assistive device.   OBJECTIVE:   DIAGNOSTIC FINDINGS: 10/17/2022 review of chart:  2 views of the left knee show tricompartment arthritis with bone-on-bone  wear of all 3 compartments.  There are large osteophytes in all 3  compartments and significant joint space narrowing.  2 views of the right knee show severe tricompartment arthritis.  There are  osteophytes in all 3 compartments, a mild effusion and severe joint space  narrowing.  PATIENT SURVEYS:  10/17/2022 FOTO was not recorded on first visit or 2nd visit so deferred  COGNITION: 10/17/2022 Overall cognitive status: Within functional limits for tasks assessed     SENSATION: 10/17/2022 No complaints of peripheral pain or paresthesias  EDEMA:  3/20/2024Noted and not objectively assessed.  LOWER EXTREMITY ROM:  Active ROM in degrees Left/Right 10/17/2022 Right 10/25/2022 AROM  Hip flexion    Hip extension    Hip abduction    Hip adduction    Hip internal rotation    Hip external rotation    Knee flexion 112/63 70 deg in supine heel slide  Knee extension 0/-5 -18 AROM in seated LAQ    Ankle dorsiflexion    Ankle plantarflexion    Ankle inversion    Ankle eversion     (Blank rows = not tested)  LOWER EXTREMITY STRENGTH:  STRENGTH IN POUNDS Left/Right 10/17/2022   Hip flexion    Hip extension    Hip abduction    Hip adduction    Hip internal rotation    Hip external rotation    Knee flexion    Knee extension 36.9/18.3   Ankle dorsiflexion    Ankle plantarflexion    Ankle inversion    Ankle eversion      (Blank rows = not tested)   GAIT: 10/25/2022: SPC use.  10/17/2022 Distance walked: Within the clinic Assistive device utilized: Walker - 2 wheeled Level of assistance: Complete Independence Comments: Debbie Cardenas was assistive device free before surgery   TODAY'S TREATMENT:                                            DATE: 10/25/2022 Therex: Nustep Lvl 5 8 mins UE/LE for ROM Incline gastroc stretch 30 sec x 3 bilateral Seated Rt knee LAQ c pause in end ranges x 15  Supine heel slide AROM 5 sec hold x 5 Seated alternating isometric holds to tolerance 5 sec extension, 5 sec flexion x 1 min x 2  Neuro Re-ed: Tandem stance 30 sec x 2 Lt posterior, unable  to do Rt - performed c SBA   Manual: Seated Rt knee flexion mobilization c movement with IR/distraction for mobility gains.   Vasopneumatic Device: X 10 min, low compression, 34 deg to Rt knee   TODAY'S TREATMENT:                                            DATE: 10/22/2022 Nu step L5 X 5 min seat #5 Standing gastroc stretch 2 X 30 sec Standing heel and toe raises X 15 bilat Step ups onto 4 inch step leading with Rt X 10 reps with one UE support Seated knee flexion AAROM stretch 5 sec X 10 Seated LAQ 2-3 sec hold X 10 Sit to stands with UE support, slow lower X 10 Supine quad set 5 sec X 10  Manual: Rt knee PROM to tolerance with overpressure  -Vasopnuematic device X 10 min, low compression, 34 deg to Rt knee   TODAY'S TREATMENT:                                            DATE: 10/17/2022 Quadriceps sets with right heel prop 10 x 5 seconds Tailgate knee flexion 1 minute Seated AAROM knee flexion (left pushes right into flexion) 5X 10 seconds  Vaso right knee 10 minutes Medium 34*   PATIENT EDUCATION:  10/25/2022 Education details: HEP update Person educated: Patient  Education method: Consulting civil engineer, Demonstration, Verbal cues, and Handouts Education comprehension: verbalized understanding, returned demonstration, verbal cues  required  HOME EXERCISE PROGRAM: Access Code: [email or web address] URL: [email or web address] Date: 10/25/2022 Prepared by: Scot Jun  Exercises - Seated Knee Flexion AAROM  - 5 x daily - 7 x weekly - 1 sets - 1 reps - 3 minutes hold - Supine Quadricep Sets  - 5 x daily - 7 x weekly - 2 sets - 10 reps - 5 second hold - Seated Long Arc Quad  - 3-5 x daily - 7 x weekly - 1-2 sets - 10 reps - 2 hold - Supine Knee Extension Mobilization with Weight  - 3-5 x daily - 7 x weekly - 1 sets - 1 reps - to tolerance up to 15 mins hold  ASSESSMENT:  CLINICAL IMPRESSION:  Noted restriction and symptoms at end ranges for flexion and extension.  ROM primary concern at this time due to lack of general mobility.  Improvements in mobility to help progress in strengthening activity as well.  Continued skilled PT services indicated.    OBJECTIVE IMPAIRMENTS: Abnormal gait, decreased activity tolerance, decreased endurance, decreased knowledge of condition, difficulty walking, decreased ROM, decreased strength, increased edema, and pain.   ACTIVITY LIMITATIONS: bending, sitting, standing, squatting, sleeping, stairs, and locomotion level  PARTICIPATION LIMITATIONS: cleaning, driving, shopping, and community activity  PERSONAL FACTORS: CABG x 4, previous left femur fracture in 2020, HTN, HLD, OA, anxiety, left THA in 2020 are also affecting patient's functional outcome.   REHAB POTENTIAL: Good  CLINICAL DECISION MAKING: Stable/uncomplicated  EVALUATION COMPLEXITY: Low   GOALS: Goals reviewed with patient? Yes  SHORT TERM GOALS: Target date: 11/14/2022 Debbie Cardenas will improve her right knee AROM to 2-0-90 degrees  Goal status: on going 10/25/2022  2.  Debbie Cardenas will be independent with her day 1 home exercise program  Goal status: on going 10/25/2022  LONG TERM GOALS: Target date: 12/12/2022  Debbie Cardenas will improve her right knee AROM to 0-110 degrees Baseline: 5-0-63 degrees Goal status: INITIAL  2.   Improve right quadriceps strength to at least 40 pounds Baseline: 18 pounds Goal status: INITIAL  3.  Debbie Cardenas will be independent and comfortable without the use of an assistive device Baseline: Using a wheeled walker at evaluation Goal status: INITIAL  4.  Improve FOTO to recommended value by discharge Baseline: Will be completed visit 2 Goal status: INITIAL  5.  Debbie Cardenas will be independent and compliant with a long-term home exercise program at discharge Baseline: Started 10/17/2022 Goal status: INITIAL   PLAN:  PT FREQUENCY: 2-3x/week  PT DURATION: 8 weeks  PLANNED INTERVENTIONS: Therapeutic exercises, Therapeutic activity, Neuromuscular re-education, Balance training, Gait training, Patient/Family education, Self Care, Joint mobilization, Stair training, Cryotherapy, Vasopneumatic device, and Manual therapy  PLAN FOR NEXT SESSION: Progressive strengthening, static balance, manual for ROM gains.    Scot Jun, PT, DPT, OCS, ATC 10/25/22  4:25 PM

## 2022-10-31 ENCOUNTER — Encounter: Payer: Self-pay | Admitting: Rehabilitative and Restorative Service Providers"

## 2022-10-31 ENCOUNTER — Ambulatory Visit (INDEPENDENT_AMBULATORY_CARE_PROVIDER_SITE_OTHER): Payer: HMO | Admitting: Rehabilitative and Restorative Service Providers"

## 2022-10-31 DIAGNOSIS — R600 Localized edema: Secondary | ICD-10-CM

## 2022-10-31 DIAGNOSIS — R262 Difficulty in walking, not elsewhere classified: Secondary | ICD-10-CM | POA: Diagnosis not present

## 2022-10-31 DIAGNOSIS — M25661 Stiffness of right knee, not elsewhere classified: Secondary | ICD-10-CM | POA: Diagnosis not present

## 2022-10-31 DIAGNOSIS — M6281 Muscle weakness (generalized): Secondary | ICD-10-CM

## 2022-10-31 DIAGNOSIS — M25561 Pain in right knee: Secondary | ICD-10-CM

## 2022-10-31 NOTE — Therapy (Signed)
OUTPATIENT PHYSICAL THERAPY TREATMENT  Patient Name: Debbie Cardenas MRN: [id number] DOB:01/25/1946, 77 y.o., female Today's Date: 10/31/2022  END OF SESSION:  PT End of Session - 10/31/22 1719     Visit Number 4    Date for PT Re-Evaluation 12/12/22    Authorization Type Health Team $10 copay    Progress Note Due on Visit 10    PT Start Time 1432    PT Stop Time 1520    PT Time Calculation (min) 48 min    Activity Tolerance Patient tolerated treatment well;No increased pain;Patient limited by pain    Behavior During Therapy [facility] for tasks assessed/performed             Past Medical History:  Diagnosis Date   Anxiety    Arthritis    knees   Closed fracture of neck of left femur 08/15/2018   Coronary artery disease    Hyperlipidemia    Hypertension    Pneumonia    Past Surgical History:  Procedure Laterality Date   APPENDECTOMY     CARDIAC CATHETERIZATION     CATARACT EXTRACTION, BILATERAL     CHOLECYSTECTOMY     CORONARY ARTERY BYPASS GRAFT N/A 08/15/2020   Procedure: CORONARY ARTERY BYPASS GRAFTING (CABG)X4. USING BILATERAL MAMMARY ARTERIES AND RIGHT GREATER SAPHENOUS VEIN HARVESTED ENDOSCOPICALLY.;  Surgeon: Gaye Pollack, MD;  Location: [place] [facility];  Service: Open Heart Surgery;  Laterality: N/A;   EYE SURGERY     bilateral cataract removal   KNEE SURGERY     LEFT HEART CATH AND CORONARY ANGIOGRAPHY N/A 07/27/2020   Procedure: LEFT HEART CATH AND CORONARY ANGIOGRAPHY;  Surgeon: Martinique, Peter M, MD;  Location: [place] [facility];  Service: Cardiovascular;  Laterality: N/A;   TEE WITHOUT CARDIOVERSION N/A 08/15/2020   Procedure: TRANSESOPHAGEAL ECHOCARDIOGRAM (TEE);  Surgeon: Gaye Pollack, MD;  Location: [place];  Service: Open Heart Surgery;  Laterality: N/A;   TOTAL HIP ARTHROPLASTY Left 08/15/2018   Procedure: TOTAL HIP ARTHROPLASTY ANTERIOR APPROACH;  Surgeon: Mcarthur Rossetti, MD;  [facility];  Service: Orthopedics;  Laterality: Left;   TOTAL KNEE  ARTHROPLASTY Right 10/02/2022   Procedure: RIGHT TOTAL KNEE ARTHROPLASTY;  Surgeon: Mcarthur Rossetti, MD;  Location: [place];  Service: Orthopedics;  Laterality: Right;   Patient Active Problem List   Diagnosis Date Noted   Delirium 10/10/2022   Atrial fibrillation with RVR 10/09/2022   Chest pain 10/09/2022   Elevated troponin 10/09/2022   Pulmonary edema 10/09/2022   Hypokalemia 10/09/2022   Normocytic anemia 10/09/2022   Memory loss 10/09/2022   Status post total right knee replacement 10/02/2022   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   Coronary artery disease 08/15/2020   Coronary artery 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 07/27/2020   HTN (hypertension) 07/27/2020   Hyperlipidemia 07/27/2020   Unilateral primary osteoarthritis, left knee 03/26/2019   Status post total replacement of left hip 08/28/2018   Hip fracture 08/15/2018   Closed fracture of neck of left femur 08/15/2018    PCP: Daphane Shepherd, PA  REFERRING PROVIDER: Mcarthur Rossetti, MD  REFERRING DIAG:  Diagnosis  [phone] (ICD-10-CM) - Status post total right knee replacement    THERAPY DIAG:  Difficulty in walking, not elsewhere classified  Muscle weakness (generalized)  Localized edema  Stiffness of right knee, not elsewhere classified  Right knee pain, unspecified chronicity  Rationale for Evaluation and Treatment: Rehabilitation  ONSET DATE: October 02, 2022  SUBJECTIVE:  SUBJECTIVE STATEMENT: Debbie Cardenas reports she is very limited with her HEP compliance due to pain.  She is not confident her current pain meds are doing what she would like them to do.  PERTINENT HISTORY: CABG x 4, previous left femur fracture in 2020, HTN, HLD, OA, anxiety, left THA in 2020  PAIN:  NPRS scale: 3-7/10 this week Pain location: Right knee Pain description: Ache, tight, sore and throbbing Aggravating factors: Sleeping, prolonged postures and hurts all the time Relieving  factors: Ice and exercise  PRECAUTIONS: None  WEIGHT BEARING RESTRICTIONS: No  FALLS:  Has patient fallen in last 6 months? No  LIVING ENVIRONMENT: Lives with: lives with their family and lives with their spouse Lives in: House/apartment Stairs:  Does OK with handrail Has following equipment at home: Single point cane, Environmental consultant - 2 wheeled, and Grab bars  OCCUPATION: Retired, return to going to concerts  PLOF: [place]: Return to normal household activities without pain or use of an assistive device.   OBJECTIVE:   DIAGNOSTIC FINDINGS: 10/17/2022 review of chart:  2 views of the left knee show tricompartment arthritis with bone-on-bone  wear of all 3 compartments.  There are large osteophytes in all 3  compartments and significant joint space narrowing.  2 views of the right knee show severe tricompartment arthritis.  There are  osteophytes in all 3 compartments, a mild effusion and severe joint space  narrowing.  PATIENT SURVEYS:  10/17/2022 FOTO was not recorded on first visit or 2nd visit so deferred  COGNITION: 10/17/2022 Overall cognitive status: Within functional limits for tasks assessed     SENSATION: 10/17/2022 No complaints of peripheral pain or paresthesias  EDEMA:  3/20/2024Noted and not objectively assessed.  LOWER EXTREMITY ROM:  Active ROM in degrees Left/Right 10/17/2022 Right 10/25/2022 AROM Right 10/31/2022  Hip flexion     Hip extension     Hip abduction     Hip adduction     Hip internal rotation     Hip external rotation     Knee flexion 112/63 70 deg in supine heel slide 82 degrees with PT assist PROM  Knee extension 0/-5 -18 AROM in seated LAQ   -4 AROM (lacking between 3 and 4 degrees)  Ankle dorsiflexion     Ankle plantarflexion     Ankle inversion     Ankle eversion      (Blank rows = not tested)  LOWER EXTREMITY STRENGTH:  STRENGTH IN POUNDS Left/Right 10/17/2022   Hip flexion    Hip extension    Hip  abduction    Hip adduction    Hip internal rotation    Hip external rotation    Knee flexion    Knee extension 36.9/18.3   Ankle dorsiflexion    Ankle plantarflexion    Ankle inversion    Ankle eversion     (Blank rows = not tested)   GAIT: 10/25/2022: SPC use.  10/17/2022 Distance walked: Within the clinic Assistive device utilized: Walker - 2 wheeled Level of assistance: Complete Independence Comments: Naseem was assistive device free before surgery   TODAY'S TREATMENT:                                             DATE:  10/31/2022 Tailgate knee flexion 1 minute AAROM knee flexion (left pushes right into flexion) 10X 10 seconds with PT overpressure Quadriceps  sets with right heel prop 3 sets of 10 for 5 seconds Supine knee flexion with belt and PT overpressure 10X 10 seconds  Vaso right knee 10 minutes Medium 34*   10/25/2022 Therex: Nustep Lvl 5 8 mins UE/LE for ROM Incline gastroc stretch 30 sec x 3 bilateral Seated Rt knee LAQ c pause in end ranges x 15  Supine heel slide AROM 5 sec hold x 5 Seated alternating isometric holds to tolerance 5 sec extension, 5 sec flexion x 1 min x 2  Neuro Re-ed: Tandem stance 30 sec x 2 Lt posterior, unable to do Rt - performed c SBA  Manual: Seated Rt knee flexion mobilization c movement with IR/distraction for mobility gains.   Vasopneumatic Device: X 10 min, low compression, 34 deg to Rt knee    10/22/2022 Nu step L5 X 5 min seat #5 Standing gastroc stretch 2 X 30 sec Standing heel and toe raises X 15 bilat Step ups onto 4 inch step leading with Rt X 10 reps with one UE support Seated knee flexion AAROM stretch 5 sec X 10 Seated LAQ 2-3 sec hold X 10 Sit to stands with UE support, slow lower X 10 Supine quad set 5 sec X 10  Manual: Rt knee PROM to tolerance with overpressure  -Vasopnuematic device X 10 min, low compression, 34 deg to Rt knee  PATIENT EDUCATION:  10/25/2022 Education details: HEP update Person  educated: Patient  Education method: Consulting civil engineer, Demonstration, Verbal cues, and Handouts Education comprehension: verbalized understanding, returned demonstration, verbal cues required  HOME EXERCISE PROGRAM: Access Code: [email or web address] URL: [email or web address] Date: 10/25/2022 Prepared by: Scot Jun  Exercises - Seated Knee Flexion AAROM  - 5 x daily - 7 x weekly - 1 sets - 1 reps - 3 minutes hold - Supine Quadricep Sets  - 5 x daily - 7 x weekly - 2 sets - 10 reps - 5 second hold - Seated Long Arc Quad  - 3-5 x daily - 7 x weekly - 1-2 sets - 10 reps - 2 hold - Supine Knee Extension Mobilization with Weight  - 3-5 x daily - 7 x weekly - 1 sets - 1 reps - to tolerance up to 15 mins hold  ASSESSMENT:  CLINICAL IMPRESSION:  Nanyamka was able to achieve 82 degrees of flexion with PT assistance today.  Extension AROM is lacking 4 degrees.  I reinforced the importance of consistent HEP compliance focused on AROM, quadriceps strength and edema control to meet LTGs.  Continue current plan with heavy emphasis on AROM and edema control.   OBJECTIVE IMPAIRMENTS: Abnormal gait, decreased activity tolerance, decreased endurance, decreased knowledge of condition, difficulty walking, decreased ROM, decreased strength, increased edema, and pain.   ACTIVITY LIMITATIONS: bending, sitting, standing, squatting, sleeping, stairs, and locomotion level  PARTICIPATION LIMITATIONS: cleaning, driving, shopping, and community activity  PERSONAL FACTORS: CABG x 4, previous left femur fracture in 2020, HTN, HLD, OA, anxiety, left THA in 2020 are also affecting patient's functional outcome.   REHAB POTENTIAL: Good  CLINICAL DECISION MAKING: Stable/uncomplicated  EVALUATION COMPLEXITY: Low   GOALS: Goals reviewed with patient? Yes  SHORT TERM GOALS: Target date: 11/14/2022 [name] will improve her right knee AROM to 2-0-90 degrees  Goal status: On Going 10/31/2022  2.  [name] will be independent  with her day 1 home exercise program  Goal status: Met 10/31/2022   LONG TERM GOALS: Target date: 12/12/2022  [name] will improve her right knee AROM to 0-110 degrees Baseline: 5-0-63 degrees  Goal status: INITIAL  2.  Improve right quadriceps strength to at least 40 pounds Baseline: 18 pounds Goal status: INITIAL  3.  [place] will be independent and comfortable without the use of an assistive device Baseline: Using a wheeled walker at evaluation Goal status: INITIAL  4.  Improve FOTO to recommended value by discharge Baseline: Will be completed visit 2 Goal status: INITIAL  5.  [name] will be independent and compliant with a long-term home exercise program at discharge Baseline: Started 10/17/2022 Goal status: INITIAL   PLAN:  PT FREQUENCY: 2-3x/week  PT DURATION: 8 weeks  PLANNED INTERVENTIONS: Therapeutic exercises, Therapeutic activity, Neuromuscular re-education, Balance training, Gait training, Patient/Family education, Self Care, Joint mobilization, Stair training, Cryotherapy, Vasopneumatic device, and Manual therapy  PLAN FOR NEXT SESSION: AROM, edema control and quadriceps strengthening.  Balance and functional activities as AROM progresses.   Farley Ly PT, MPT 10/31/22  5:25 PM

## 2022-11-02 ENCOUNTER — Ambulatory Visit (INDEPENDENT_AMBULATORY_CARE_PROVIDER_SITE_OTHER): Payer: HMO | Admitting: Rehabilitative and Restorative Service Providers"

## 2022-11-02 ENCOUNTER — Encounter: Payer: Self-pay | Admitting: Rehabilitative and Restorative Service Providers"

## 2022-11-02 DIAGNOSIS — M6281 Muscle weakness (generalized): Secondary | ICD-10-CM

## 2022-11-02 DIAGNOSIS — R600 Localized edema: Secondary | ICD-10-CM | POA: Diagnosis not present

## 2022-11-02 DIAGNOSIS — R262 Difficulty in walking, not elsewhere classified: Secondary | ICD-10-CM

## 2022-11-02 DIAGNOSIS — M25661 Stiffness of right knee, not elsewhere classified: Secondary | ICD-10-CM | POA: Diagnosis not present

## 2022-11-02 DIAGNOSIS — M25561 Pain in right knee: Secondary | ICD-10-CM

## 2022-11-02 NOTE — Therapy (Signed)
OUTPATIENT PHYSICAL THERAPY TREATMENT  Patient Name: Gertie Exonamela L Detzel MRN: [id number] DOB:12-01-1945, 77 y.o., female Today's Date: 11/02/2022  END OF SESSION:  PT End of Session - 11/02/22 1303     Visit Number 5    Date for PT Re-Evaluation 12/12/22    Authorization Type [employer] $10 copay    Authorization - Visit Number 5    Progress Note Due on Visit 10    PT Start Time 1300    PT Stop Time 1354    PT Time Calculation (min) 54 min    Activity Tolerance Patient tolerated treatment well;No increased pain;Patient limited by pain    Behavior [facility] for tasks assessed/performed              Past Medical History:  Diagnosis Date   Anxiety    Arthritis    knees   Closed fracture of neck of left femur 08/15/2018   Coronary artery disease    Hyperlipidemia    Hypertension    Pneumonia    Past Surgical History:  Procedure Laterality Date   APPENDECTOMY     CARDIAC CATHETERIZATION     CATARACT EXTRACTION, BILATERAL     CHOLECYSTECTOMY     CORONARY ARTERY BYPASS GRAFT N/A 08/15/2020   Procedure: CORONARY ARTERY BYPASS GRAFTING (CABG)X4. USING BILATERAL MAMMARY ARTERIES AND RIGHT GREATER SAPHENOUS VEIN HARVESTED ENDOSCOPICALLY.;  Surgeon: Alleen BorneBartle, Bryan K, MD;  Location: [facility] OR;  Service: Open Heart Surgery;  Laterality: N/A;   EYE SURGERY     bilateral cataract removal   KNEE SURGERY     LEFT HEART CATH AND CORONARY ANGIOGRAPHY N/A 07/27/2020   Procedure: LEFT HEART CATH AND CORONARY ANGIOGRAPHY;  Surgeon: SwazilandJordan, Peter M, MD;  [facility];  Service: Cardiovascular;  Laterality: N/A;   TEE WITHOUT CARDIOVERSION N/A 08/15/2020   Procedure: TRANSESOPHAGEAL ECHOCARDIOGRAM (TEE);  Surgeon: Alleen BorneBartle, Bryan K, MD;  [facility];  Service: Open Heart Surgery;  Laterality: N/A;   TOTAL HIP ARTHROPLASTY Left 08/15/2018   Procedure: TOTAL HIP ARTHROPLASTY ANTERIOR APPROACH;  Surgeon: Kathryne HitchBlackman, Christopher Y, MD;  Location: [facility] ORS;  Service:  Orthopedics;  Laterality: Left;   TOTAL KNEE ARTHROPLASTY Right 10/02/2022   Procedure: RIGHT TOTAL KNEE ARTHROPLASTY;  Surgeon: Kathryne HitchBlackman, Christopher Y, MD;  Location: [facility] OR;  Service: Orthopedics;  Laterality: Right;   Patient Active Problem List   Diagnosis Date Noted   Delirium 10/10/2022   Atrial fibrillation with RVR 10/09/2022   Chest pain 10/09/2022   Elevated troponin 10/09/2022   Pulmonary edema 10/09/2022   Hypokalemia 10/09/2022   Normocytic anemia 10/09/2022   Memory loss 10/09/2022   Status post total right knee replacement 10/02/2022   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   Coronary artery disease 08/15/2020   Coronary artery 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 07/27/2020   HTN (hypertension) 07/27/2020   Hyperlipidemia 07/27/2020   Unilateral primary osteoarthritis, left knee 03/26/2019   Status post total replacement of left hip 08/28/2018   Hip fracture 08/15/2018   Closed fracture of neck of left femur 08/15/2018    PCP: Theora Gianottihelle Jeffrey, PA  REFERRING PROVIDER: Kathryne Hitchhristopher Y Blackman, MD  REFERRING DIAG:  Diagnosis  [phone] (ICD-10-CM) - Status post total right knee replacement    THERAPY DIAG:  Difficulty in walking, not elsewhere classified  Muscle weakness (generalized)  Localized edema  Stiffness of right knee, not elsewhere classified  Right knee pain, unspecified chronicity  Rationale for Evaluation and Treatment:  Rehabilitation  ONSET DATE: October 02, 2022  SUBJECTIVE:   SUBJECTIVE STATEMENT: Talisa reports she is doing a better job at night sleeping with the help of Tramadol.  She has not been taking pain medication before PT and was encouraged to do so moving forward.  PERTINENT HISTORY: CABG x 4, previous left femur fracture in 2020, HTN, HLD, OA, anxiety, left THA in 2020  PAIN:  NPRS scale: 0-3/10 this week, can be higher with exercises but pain is short-lived Pain location: Right knee Pain  description: Ache, tight, sore and throbbing Aggravating factors: Sleeping, prolonged postures and hurts all the time Relieving factors: Ice and exercise  PRECAUTIONS: None  WEIGHT BEARING RESTRICTIONS: No  FALLS:  Has patient fallen in last 6 months? No  LIVING ENVIRONMENT: Lives with: lives with their family and lives with their spouse Lives in: House/apartment Stairs:  Does OK with handrail Has following equipment at home: Single point cane, Environmental consultant - 2 wheeled, and Grab bars  OCCUPATION: Retired, return to going to concerts  PLOF: Independent  PATIENT GOALS: Return to normal household activities without pain or use of an assistive device.   OBJECTIVE:   DIAGNOSTIC FINDINGS: 10/17/2022 review of chart:  2 views of the left knee show tricompartment arthritis with bone-on-bone  wear of all 3 compartments.  There are large osteophytes in all 3  compartments and significant joint space narrowing.  2 views of the right knee show severe tricompartment arthritis.  There are  osteophytes in all 3 compartments, a mild effusion and severe joint space  narrowing.  PATIENT SURVEYS:  10/17/2022 FOTO was not recorded on first visit or 2nd visit so deferred  COGNITION: 10/17/2022 Overall cognitive status: Within functional limits for tasks assessed     SENSATION: 10/17/2022 No complaints of peripheral pain or paresthesias  EDEMA:  3/20/2024Noted and not objectively assessed.  LOWER EXTREMITY ROM:  Active ROM in degrees Left/Right 10/17/2022 Right 10/25/2022 AROM Right 10/31/2022 Right 11/02/2022  Hip flexion      Hip extension      Hip abduction      Hip adduction      Hip internal rotation      Hip external rotation      Knee flexion 112/63 70 deg in supine heel slide 82 degrees with PT assist PROM 83  Knee extension 0/-5 -18 AROM in seated LAQ   -4 AROM (lacking between 3 and 4 degrees) -4  Ankle dorsiflexion      Ankle plantarflexion      Ankle inversion      Ankle  eversion       (Blank rows = not tested)  LOWER EXTREMITY STRENGTH:  STRENGTH IN POUNDS Left/Right 10/17/2022   Hip flexion    Hip extension    Hip abduction    Hip adduction    Hip internal rotation    Hip external rotation    Knee flexion    Knee extension 36.9/18.3   Ankle dorsiflexion    Ankle plantarflexion    Ankle inversion    Ankle eversion     (Blank rows = not tested)   GAIT: 10/25/2022: SPC use.  10/17/2022 Distance walked: Within the clinic Assistive device utilized: Walker - 2 wheeled Level of assistance: Complete Independence Comments: Morgen was assistive device free before surgery   TODAY'S TREATMENT:  DATE:  10/31/2022 Tailgate knee flexion 1 minute AAROM knee flexion (left pushes right into flexion) 10X 10 seconds with PT overpressure Quadriceps sets with right heel prop 3 sets of 10 for 5 seconds Supine knee flexion with belt and PT overpressure 10X 10 seconds Recumbent bike Seat 5 AAROM to full revolutions 5 minutes  Vaso right knee 10 minutes Medium 34*   10/25/2022 Therex: Nustep Lvl 5 8 mins UE/LE for ROM Incline gastroc stretch 30 sec x 3 bilateral Seated Rt knee LAQ c pause in end ranges x 15  Supine heel slide AROM 5 sec hold x 5 Seated alternating isometric holds to tolerance 5 sec extension, 5 sec flexion x 1 min x 2  Neuro Re-ed: Tandem stance 30 sec x 2 Lt posterior, unable to do Rt - performed c SBA  Manual: Seated Rt knee flexion mobilization c movement with IR/distraction for mobility gains.   Vasopneumatic Device: X 10 min, low compression, 34 deg to Rt knee    10/22/2022 Nu step L5 X 5 min seat #5 Standing gastroc stretch 2 X 30 sec Standing heel and toe raises X 15 bilat Step ups onto 4 inch step leading with Rt X 10 reps with one UE support Seated knee flexion AAROM stretch 5 sec X 10 Seated LAQ 2-3 sec hold X 10 Sit to stands with UE support, slow lower X 10 Supine quad set 5  sec X 10  Manual: Rt knee PROM to tolerance with overpressure  -Vasopnuematic device X 10 min, low compression, 34 deg to Rt knee   PATIENT EDUCATION:  10/25/2022 Education details: HEP update Person educated: Patient  Education method: Programmer, multimedia, Demonstration, Verbal cues, and Handouts Education comprehension: verbalized understanding, returned demonstration, verbal cues required  HOME EXERCISE PROGRAM: Access Code: [email or web address] URL: [email or web address] Date: 10/25/2022 Prepared by: Chyrel Masson  Exercises - Seated Knee Flexion AAROM  - 5 x daily - 7 x weekly - 1 sets - 1 reps - 3 minutes hold - Supine Quadricep Sets  - 5 x daily - 7 x weekly - 2 sets - 10 reps - 5 second hold - Seated Long Arc Quad  - 3-5 x daily - 7 x weekly - 1-2 sets - 10 reps - 2 hold - Supine Knee Extension Mobilization with Weight  - 3-5 x daily - 7 x weekly - 1 sets - 1 reps - to tolerance up to 15 mins hold  ASSESSMENT:  CLINICAL IMPRESSION:  Today was the first time that I would say Arshiya was truly pain-limited.  She has not been taking pain medication before PT and we discussed the importance of doing this before PT moving forward to avoid plateaus in her AROM progress.  We reviewed her HEP.  Quadriceps strength, AROM and edema control remain the heavy emphasis with her home and clinic programs.  OBJECTIVE IMPAIRMENTS: Abnormal gait, decreased activity tolerance, decreased endurance, decreased knowledge of condition, difficulty walking, decreased ROM, decreased strength, increased edema, and pain.   ACTIVITY LIMITATIONS: bending, sitting, standing, squatting, sleeping, stairs, and locomotion level  PARTICIPATION LIMITATIONS: cleaning, driving, shopping, and community activity  PERSONAL FACTORS: CABG x 4, previous left femur fracture in 2020, HTN, HLD, OA, anxiety, left THA in 2020 are also affecting patient's functional outcome.   REHAB POTENTIAL: Good  CLINICAL DECISION MAKING:  Stable/uncomplicated  EVALUATION COMPLEXITY: Low   GOALS: Goals reviewed with patient? Yes  SHORT TERM GOALS: Target date: 11/14/2022 [name] will improve her right knee AROM to 2-0-90 degrees  Goal  status: On Going 11/02/2022  2.  [name] will be independent with her day 1 home exercise program  Goal status: Met 10/31/2022   LONG TERM GOALS: Target date: 12/12/2022  [name] will improve her right knee AROM to 0-110 degrees Baseline: 5-0-63 degrees Goal status: INITIAL  2.  Improve right quadriceps strength to at least 40 pounds Baseline: 18 pounds Goal status: INITIAL  3.  Rinaldo Cloudamela will be independent and comfortable without the use of an assistive device Baseline: Using a wheeled walker at evaluation Goal status: INITIAL  4.  Improve FOTO to recommended value by discharge Baseline: Will be completed visit 2 Goal status: INITIAL  5.  [name] will be independent and compliant with a long-term home exercise program at discharge Baseline: Started 10/17/2022 Goal status: INITIAL   PLAN:  PT FREQUENCY: 2-3x/week  PT DURATION: 8 weeks  PLANNED INTERVENTIONS: Therapeutic exercises, Therapeutic activity, Neuromuscular re-education, Balance training, Gait training, Patient/Family education, Self Care, Joint mobilization, Stair training, Cryotherapy, Vasopneumatic device, and Manual therapy  PLAN FOR NEXT SESSION: AROM, edema control and quadriceps strengthening.  Balance and functional activities as AROM progresses.   Cherlyn Cushingob W Raisha Brabender PT, MPT 11/02/22  1:48 PM

## 2022-11-07 ENCOUNTER — Encounter: Payer: Self-pay | Admitting: Rehabilitative and Restorative Service Providers"

## 2022-11-07 ENCOUNTER — Ambulatory Visit (INDEPENDENT_AMBULATORY_CARE_PROVIDER_SITE_OTHER): Payer: HMO | Admitting: Rehabilitative and Restorative Service Providers"

## 2022-11-07 DIAGNOSIS — R262 Difficulty in walking, not elsewhere classified: Secondary | ICD-10-CM | POA: Diagnosis not present

## 2022-11-07 DIAGNOSIS — M25661 Stiffness of right knee, not elsewhere classified: Secondary | ICD-10-CM | POA: Diagnosis not present

## 2022-11-07 DIAGNOSIS — M6281 Muscle weakness (generalized): Secondary | ICD-10-CM

## 2022-11-07 DIAGNOSIS — R600 Localized edema: Secondary | ICD-10-CM

## 2022-11-07 DIAGNOSIS — M25561 Pain in right knee: Secondary | ICD-10-CM

## 2022-11-07 NOTE — Therapy (Signed)
OUTPATIENT PHYSICAL THERAPY TREATMENT/PROGRESS NOTE  Patient Name: Debbie Cardenas MRN: [id number] DOB:08/03/1945, 77 y.o., female Today's Date: 11/07/2022  END OF SESSION:  PT End of Session - 11/07/22 1435     Visit Number 6    Date for PT Re-Evaluation 12/12/22    Authorization Type [employer] $10 copay    Authorization - Visit Number 6    Progress Note Due on Visit 10    PT Start Time 1432    PT Stop Time 1527    PT Time Calculation (min) 55 min    Activity Tolerance Patient tolerated treatment well;No increased pain;Patient limited by pain    Behavior During Therapy [facility] for tasks assessed/performed            Progress Note Reporting Period 10/17/2022 to 11/07/2022  See note below for Objective Data and Assessment of Progress/Goals.     Past Medical History:  Diagnosis Date   Anxiety    Arthritis    knees   Closed fracture of neck of left femur 08/15/2018   Coronary artery disease    Hyperlipidemia    Hypertension    Pneumonia    Past Surgical History:  Procedure Laterality Date   APPENDECTOMY     CARDIAC CATHETERIZATION     CATARACT EXTRACTION, BILATERAL     CHOLECYSTECTOMY     CORONARY ARTERY BYPASS GRAFT N/A 08/15/2020   Procedure: CORONARY ARTERY BYPASS GRAFTING (CABG)X4. USING BILATERAL MAMMARY ARTERIES AND RIGHT GREATER SAPHENOUS VEIN HARVESTED ENDOSCOPICALLY.;  Surgeon: Alleen Borne, MD;  Location: [facility] OR;  Service: Open Heart Surgery;  Laterality: N/A;   EYE SURGERY     bilateral cataract removal   KNEE SURGERY     LEFT HEART CATH AND CORONARY ANGIOGRAPHY N/A 07/27/2020   Procedure: LEFT HEART CATH AND CORONARY ANGIOGRAPHY;  Surgeon: Swazil[place], Peter M, MD;  [facility];  Service: Cardiovascular;  Laterality: N/A;   TEE WITHOUT CARDIOVERSION N/A 08/15/2020   Procedure: TRANSESOPHAGEAL ECHOCARDIOGRAM (TEE);  Surgeon: Alleen Borne, MD;  Location: [facility] OR;  Service: Open Heart Surgery;  Laterality: N/A;   TOTAL HIP ARTHROPLASTY Left  08/15/2018   Procedure: TOTAL HIP ARTHROPLASTY ANTERIOR APPROACH;  Surgeon: Kathryne Hitch, MD;  Location: [facility] ORS;  Service: Orthopedics;  Laterality: Left;   TOTAL KNEE ARTHROPLASTY Right 10/02/2022   Procedure: RIGHT TOTAL KNEE ARTHROPLASTY;  Surgeon: Kathryne Hitch, MD;  Location: [facility] OR;  Service: Orthopedics;  Laterality: Right;   Patient Active Problem List   Diagnosis Date Noted   Delirium 10/10/2022   Atrial fibrillation with RVR 10/09/2022   Chest pain 10/09/2022   Elevated troponin 10/09/2022   Pulmonary edema 10/09/2022   Hypokalemia 10/09/2022   Normocytic anemia 10/09/2022   Memory loss 10/09/2022   Status post total right knee replacement 10/02/2022   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   Coronary artery disease 08/15/2020   Coronary artery 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 07/27/2020   HTN (hypertension) 07/27/2020   Hyperlipidemia 07/27/2020   Unilateral primary osteoarthritis, left knee 03/26/2019   Status post total replacement of left hip 08/28/2018   Hip fracture 08/15/2018   Closed fracture of neck of left femur 08/15/2018    PCP: Theora Gianotti, PA  REFERRING PROVIDER: Kathryne Hitch, MD  REFERRING DIAG:  Diagnosis  [phone] (ICD-10-CM) - Status post total right knee replacement    THERAPY DIAG:  Difficulty in walking, not elsewhere classified  Muscle weakness (generalized)  Stiffness  of right knee, not elsewhere classified  Right knee pain, unspecified chronicity  Localized edema  Rationale for Evaluation and Treatment: Rehabilitation  ONSET DATE: October 02, 2022  SUBJECTIVE:   SUBJECTIVE STATEMENT: Debbie Cardenas reports she is taking Tramadol before bed and PT only.  Her HEP compliance has been good this week.  PERTINENT HISTORY: CABG x 4, previous left femur fracture in 2020, HTN, HLD, OA, anxiety, left THA in 2020  PAIN:  NPRS scale: 0-5/10 this week, can be higher with exercises but  pain is short-lived Pain location: Right knee Pain description: Ache, tight, sore and throbbing Aggravating factors: Sleeping, prolonged postures and hurts all the time Relieving factors: Ice and exercise  PRECAUTIONS: None  WEIGHT BEARING RESTRICTIONS: No  FALLS:  Has patient fallen in last 6 months? No  LIVING ENVIRONMENT: Lives with: lives with their family and lives with their spouse Lives in: House/apartment Stairs:  Does OK with handrail Has following equipment at home: Single point cane, Environmental consultantWalker - 2 wheeled, and Grab bars  OCCUPATION: Retired, return to going to concerts  PLOF: Independent  PATIENT GOALS: Return to normal household activities without pain or use of an assistive device.   OBJECTIVE:   DIAGNOSTIC FINDINGS: 10/17/2022 review of chart:  2 views of the left knee show tricompartment arthritis with bone-on-bone  wear of all 3 compartments.  There are large osteophytes in all 3  compartments and significant joint space narrowing.  2 views of the right knee show severe tricompartment arthritis.  There are  osteophytes in all 3 compartments, a mild effusion and severe joint space  narrowing.  PATIENT SURVEYS:  10/17/2022 FOTO was not recorded on first visit or 2nd visit so deferred  COGNITION: 10/17/2022 Overall cognitive status: Within functional limits for tasks assessed     SENSATION: 10/17/2022 No complaints of peripheral pain or paresthesias  EDEMA:  3/20/2024Noted and not objectively assessed.  LOWER EXTREMITY ROM:  Active ROM in degrees Left/Right 10/17/2022 Right 10/25/2022 AROM Right 10/31/2022 Right 11/02/2022 Right 11/07/2022  Hip flexion       Hip extension       Hip abduction       Hip adduction       Hip internal rotation       Hip external rotation       Knee flexion 112/63 70 deg in supine heel slide 82 degrees with PT assist PROM 83 91  Knee extension 0/-5 -18 AROM in seated LAQ   -4 AROM (lacking between 3 and 4 degrees) -4 -4   Ankle dorsiflexion       Ankle plantarflexion       Ankle inversion       Ankle eversion        (Blank rows = not tested)  LOWER EXTREMITY STRENGTH:  STRENGTH IN POUNDS Left/Right 10/17/2022 Left/Right 11/07/2022  Hip flexion    Hip extension    Hip abduction    Hip adduction    Hip internal rotation    Hip external rotation    Knee flexion    Knee extension 36.9/18.3 38.6/27.6  Ankle dorsiflexion    Ankle plantarflexion    Ankle inversion    Ankle eversion     (Blank rows = not tested)   GAIT: 4/10/20204: No AD  10/25/2022: SPC use.  10/17/2022 Distance walked: Within the clinic Assistive device utilized: Walker - 2 wheeled Level of assistance: Complete Independence Comments: Debbie Cardenas was assistive device free before surgery   TODAY'S TREATMENT:  DATE:  11/07/2022 Tailgate knee flexion 1 minute AAROM knee flexion (left pushes right into flexion) 10X 10 seconds with PT overpressure Quadriceps sets with right heel prop 3 sets of 10 for 5 seconds Supine knee flexion with belt and PT overpressure 10X 10 seconds Recumbent bike Seat 5 AAROM to full revolutions 8 minutes  Vaso right knee 10 minutes Medium 34*   10/31/2022 Tailgate knee flexion 1 minute AAROM knee flexion (left pushes right into flexion) 10X 10 seconds with PT overpressure Quadriceps sets with right heel prop 3 sets of 10 for 5 seconds Supine knee flexion with belt and PT overpressure 10X 10 seconds Recumbent bike Seat 5 AAROM to full revolutions 5 minutes  Vaso right knee 10 minutes Medium 34*   10/25/2022 Therex: Nustep Lvl 5 8 mins UE/LE for ROM Incline gastroc stretch 30 sec x 3 bilateral Seated Rt knee LAQ c pause in end ranges x 15  Supine heel slide AROM 5 sec hold x 5 Seated alternating isometric holds to tolerance 5 sec extension, 5 sec flexion x 1 min x 2  Neuro Re-ed: Tandem stance 30 sec x 2 Lt posterior, unable to do Rt - performed c  SBA  Manual: Seated Rt knee flexion mobilization c movement with IR/distraction for mobility gains.   Vasopneumatic Device: X 10 min, low compression, 34 deg to Rt knee    PATIENT EDUCATION:  10/25/2022 Education details: HEP update Person educated: Patient  Education method: Explanation, Demonstration, Verbal cues, and Handouts Education comprehension: verbalized understanding, returned demonstration, verbal cues required  HOME EXERCISE PROGRAM: Access Code: [email or web address] URL: [email or web address] Date: 10/25/2022 Prepared by: Chyrel Masson  Exercises - Seated Knee Flexion AAROM  - 5 x daily - 7 x weekly - 1 sets - 1 reps - 3 minutes hold - Supine Quadricep Sets  - 5 x daily - 7 x weekly - 2 sets - 10 reps - 5 second hold - Seated Long Arc Quad  - 3-5 x daily - 7 x weekly - 1-2 sets - 10 reps - 2 hold - Supine Knee Extension Mobilization with Weight  - 3-5 x daily - 7 x weekly - 1 sets - 1 reps - to tolerance up to 15 mins hold  ASSESSMENT:  CLINICAL IMPRESSION:  Debbie Cardenas was not as pain-limited with her physical therapy today due to taking a tramadol before treatment.  AROM progress was noted at 4-0-91 degrees today.  Quadriceps strength, AROM and edema control remain the heavy emphasis with her home and clinic programs.  OBJECTIVE IMPAIRMENTS: Abnormal gait, decreased activity tolerance, decreased endurance, decreased knowledge of condition, difficulty walking, decreased ROM, decreased strength, increased edema, and pain.   ACTIVITY LIMITATIONS: bending, sitting, standing, squatting, sleeping, stairs, and locomotion level  PARTICIPATION LIMITATIONS: cleaning, driving, shopping, and community activity  PERSONAL FACTORS: CABG x 4, previous left femur fracture in 2020, HTN, HLD, OA, anxiety, left THA in 2020 are also affecting patient's functional outcome.   REHAB POTENTIAL: Good  CLINICAL DECISION MAKING: Stable/uncomplicated  EVALUATION COMPLEXITY:  Low   GOALS: Goals reviewed with patient? Yes  SHORT TERM GOALS: Target date: 11/14/2022 Debbie Cardenas will improve her right knee AROM to 2-0-90 degrees  Goal status: Partially Met 11/07/2022  2.  Debbie Cardenas will be independent with her day 1 home exercise program  Goal status: Met 10/31/2022   LONG TERM GOALS: Target date: 12/12/2022  Debbie Cardenas will improve her right knee AROM to 0-110 degrees Baseline: 5-0-63 degrees Goal status: On Going 11/07/2022  2.  Improve  right quadriceps strength to at least 40 pounds Baseline: 18 pounds Goal status: On Going 11/07/2022  3.  Debbie Cardenas will be independent and comfortable without the use of an assistive device Baseline: Using a wheeled walker at evaluation Goal status: Partially Met 11/07/2022  4.  Improve FOTO to recommended value by discharge Baseline: Will be completed visit 2 Goal status: On Going 11/07/2022  5.  Debbie Cardenas will be independent and compliant with a long-term home exercise program at discharge Baseline: Started 10/17/2022 Goal status: On Going 11/07/2022   PLAN:  PT FREQUENCY: 2-3x/week  PT DURATION: 8 weeks  PLANNED INTERVENTIONS: Therapeutic exercises, Therapeutic activity, Neuromuscular re-education, Balance training, Gait training, Patient/Family education, Self Care, Joint mobilization, Stair training, Cryotherapy, Vasopneumatic device, and Manual therapy  PLAN FOR NEXT SESSION: AROM, edema control and quadriceps strengthening.  Balance and functional activities as AROM progresses.   Cherlyn Cushing PT, MPT 11/07/22  4:59 PM

## 2022-11-09 ENCOUNTER — Ambulatory Visit (INDEPENDENT_AMBULATORY_CARE_PROVIDER_SITE_OTHER): Payer: HMO | Admitting: Rehabilitative and Restorative Service Providers"

## 2022-11-09 ENCOUNTER — Encounter: Payer: Self-pay | Admitting: Rehabilitative and Restorative Service Providers"

## 2022-11-09 DIAGNOSIS — M25661 Stiffness of right knee, not elsewhere classified: Secondary | ICD-10-CM | POA: Diagnosis not present

## 2022-11-09 DIAGNOSIS — M25561 Pain in right knee: Secondary | ICD-10-CM

## 2022-11-09 DIAGNOSIS — R262 Difficulty in walking, not elsewhere classified: Secondary | ICD-10-CM

## 2022-11-09 DIAGNOSIS — M6281 Muscle weakness (generalized): Secondary | ICD-10-CM

## 2022-11-09 DIAGNOSIS — R600 Localized edema: Secondary | ICD-10-CM

## 2022-11-09 NOTE — Therapy (Signed)
OUTPATIENT PHYSICAL THERAPY TREATMENT NOTE  Patient Name: Debbie Cardenas MRN: [id number] DOB:09-24-1945, 77 y.o., female Today's Date: 11/09/2022  END OF SESSION:  PT End of Session - 11/09/22 1300     Visit Number 7    Date for PT Re-Evaluation 12/12/22    Authorization Type [employer] $10 copay    Authorization - Visit Number 7    Progress Note Due on Visit 10    PT Start Time 1259    PT Stop Time 1353    PT Time Calculation (min) 54 min    Activity Tolerance Patient tolerated treatment well;No increased pain;Patient limited by pain    Behavior During Therapy [facility] for tasks assessed/performed             Past Medical History:  Diagnosis Date   Anxiety    Arthritis    knees   Closed fracture of neck of left femur 08/15/2018   Coronary artery disease    Hyperlipidemia    Hypertension    Pneumonia    Past Surgical History:  Procedure Laterality Date   APPENDECTOMY     CARDIAC CATHETERIZATION     CATARACT EXTRACTION, BILATERAL     CHOLECYSTECTOMY     CORONARY ARTERY BYPASS GRAFT N/A 08/15/2020   Procedure: CORONARY ARTERY BYPASS GRAFTING (CABG)X4. USING BILATERAL MAMMARY ARTERIES AND RIGHT GREATER SAPHENOUS VEIN HARVESTED ENDOSCOPICALLY.;  Surgeon: Alleen Borne, MD;  Location: [facility] OR;  Service: Open Heart Surgery;  Laterality: N/A;   EYE SURGERY     bilateral cataract removal   KNEE SURGERY     LEFT HEART CATH AND CORONARY ANGIOGRAPHY N/A 07/27/2020   Procedure: LEFT HEART CATH AND CORONARY ANGIOGRAPHY;  Surgeon: [place], Peter M, MD;  Location: [facility] INVASIVE CV LAB;  Service: Cardiovascular;  Laterality: N/A;   TEE WITHOUT CARDIOVERSION N/A 08/15/2020   Procedure: TRANSESOPHAGEAL ECHOCARDIOGRAM (TEE);  Surgeon: Alleen Borne, MD;  Location: [facility] OR;  Service: Open Heart Surgery;  Laterality: N/A;   TOTAL HIP ARTHROPLASTY Left 08/15/2018   Procedure: TOTAL HIP ARTHROPLASTY ANTERIOR APPROACH;  Surgeon: Kathryne Hitch, MD;  Location: [facility] ORS;  Service:  Orthopedics;  Laterality: Left;   TOTAL KNEE ARTHROPLASTY Right 10/02/2022   Procedure: RIGHT TOTAL KNEE ARTHROPLASTY;  Surgeon: Kathryne Hitch, MD;  Location: [facility] OR;  Service: Orthopedics;  Laterality: Right;   Patient Active Problem List   Diagnosis Date Noted   Delirium 10/10/2022   Atrial fibrillation with RVR 10/09/2022   Chest pain 10/09/2022   Elevated troponin 10/09/2022   Pulmonary edema 10/09/2022   Hypokalemia 10/09/2022   Normocytic anemia 10/09/2022   Memory loss 10/09/2022   Status post total right knee replacement 10/02/2022   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   Coronary artery disease 08/15/2020   Coronary artery 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 07/27/2020   HTN (hypertension) 07/27/2020   Hyperlipidemia 07/27/2020   Unilateral primary osteoarthritis, left knee 03/26/2019   Status post total replacement of left hip 08/28/2018   Hip fracture 08/15/2018   Closed fracture of neck of left femur 08/15/2018    PCP: Theora Gianotti, PA  REFERRING PROVIDER: Kathryne Hitch, MD  REFERRING DIAG:  Diagnosis  [phone] (ICD-10-CM) - Status post total right knee replacement    THERAPY DIAG:  Difficulty in walking, not elsewhere classified  Stiffness of right knee, not elsewhere classified  Muscle weakness (generalized)  Right knee pain, unspecified chronicity  Localized edema  Rationale for Evaluation and Treatment:  Rehabilitation  ONSET DATE: October 02, 2022  SUBJECTIVE:   SUBJECTIVE STATEMENT: Debbie Cardenas reports she is taking Tramadol before bed and PT only.  Her HEP compliance was inconsistent yesterday due to a family emergency.  PERTINENT HISTORY: CABG x 4, previous left femur fracture in 2020, HTN, HLD, OA, anxiety, left THA in 2020  PAIN:  NPRS scale: 0-5/10 this week, can be higher with exercises but pain is short-lived Pain location: Right knee Pain description: Ache, tight, sore and  throbbing Aggravating factors: Sleeping, prolonged postures and hurts all the time Relieving factors: Ice and exercise  PRECAUTIONS: None  WEIGHT BEARING RESTRICTIONS: No  FALLS:  Has patient fallen in last 6 months? No  LIVING ENVIRONMENT: Lives with: lives with their family and lives with their spouse Lives in: House/apartment Stairs:  Does OK with handrail Has following equipment at home: Single point cane, Environmental consultant - 2 wheeled, and Grab bars  OCCUPATION: Retired, return to going to concerts  PLOF: Independent  PATIENT GOALS: Return to normal household activities without pain or use of an assistive device.   OBJECTIVE:   DIAGNOSTIC FINDINGS: 10/17/2022 review of chart:  2 views of the left knee show tricompartment arthritis with bone-on-bone  wear of all 3 compartments.  There are large osteophytes in all 3  compartments and significant joint space narrowing.  2 views of the right knee show severe tricompartment arthritis.  There are  osteophytes in all 3 compartments, a mild effusion and severe joint space  narrowing.  PATIENT SURVEYS:  10/17/2022 FOTO was not recorded on first visit or 2nd visit so deferred  COGNITION: 10/17/2022 Overall cognitive status: Within functional limits for tasks assessed     SENSATION: 10/17/2022 No complaints of peripheral pain or paresthesias  EDEMA:  3/20/2024Noted and not objectively assessed.  LOWER EXTREMITY ROM:  Active ROM in degrees Left/Right 10/17/2022 Right 10/25/2022 AROM Right 10/31/2022 Right 11/02/2022 Right 11/07/2022  Hip flexion       Hip extension       Hip abduction       Hip adduction       Hip internal rotation       Hip external rotation       Knee flexion 112/63 70 deg in supine heel slide 82 degrees with PT assist PROM 83 91  Knee extension 0/-5 -18 AROM in seated LAQ   -4 AROM (lacking between 3 and 4 degrees) -4 -4  Ankle dorsiflexion       Ankle plantarflexion       Ankle inversion       Ankle  eversion        (Blank rows = not tested)  LOWER EXTREMITY STRENGTH:  STRENGTH IN POUNDS Left/Right 10/17/2022 Left/Right 11/07/2022  Hip flexion    Hip extension    Hip abduction    Hip adduction    Hip internal rotation    Hip external rotation    Knee flexion    Knee extension 36.9/18.3 38.6/27.6  Ankle dorsiflexion    Ankle plantarflexion    Ankle inversion    Ankle eversion     (Blank rows = not tested)   GAIT: 4/10/20204: No AD  10/25/2022: SPC use.  10/17/2022 Distance walked: Within the clinic Assistive device utilized: Walker - 2 wheeled Level of assistance: Complete Independence Comments: Payslee was assistive device free before surgery   TODAY'S TREATMENT:  DATE:  11/07/2022 Tailgate knee flexion 1 minute AAROM knee flexion (left pushes right into flexion) 10X 10 seconds with PT overpressure Quadriceps sets with right heel prop 2 sets of 10 for 5 seconds Supine knee flexion with belt and PT overpressure 10X 10 seconds Recumbent bike Seat 5 AAROM to full revolutions 10 CW (clockwise) and 10X CCW (counter clockwise)  Functional Activities: Double Leg Press 50# 10X full extension and stretch into flexion Single Leg Press 25# 10X right only 10X full extension and stretch into flexion  Vaso right knee 10 minutes Medium 34*   10/31/2022 Tailgate knee flexion 1 minute AAROM knee flexion (left pushes right into flexion) 10X 10 seconds with PT overpressure Quadriceps sets with right heel prop 3 sets of 10 for 5 seconds Supine knee flexion with belt and PT overpressure 10X 10 seconds Recumbent bike Seat 5 AAROM to full revolutions 5 minutes  Vaso right knee 10 minutes Medium 34*   10/25/2022 Therex: Nustep Lvl 5 8 mins UE/LE for ROM Incline gastroc stretch 30 sec x 3 bilateral Seated Rt knee LAQ c pause in end ranges x 15  Supine heel slide AROM 5 sec hold x 5 Seated alternating isometric holds to tolerance 5 sec  extension, 5 sec flexion x 1 min x 2  Neuro Re-ed: Tandem stance 30 sec x 2 Lt posterior, unable to do Rt - performed c SBA  Manual: Seated Rt knee flexion mobilization c movement with IR/distraction for mobility gains.   Vasopneumatic Device: X 10 min, low compression, 34 deg to Rt knee    PATIENT EDUCATION:  10/25/2022 Education details: HEP update Person educated: Patient  Education method: Explanation, Demonstration, Verbal cues, and Handouts Education comprehension: verbalized understanding, returned demonstration, verbal cues required  HOME EXERCISE PROGRAM: Access Code: [email or web address] URL: [email or web address] Date: 10/25/2022 Prepared by: Chyrel Masson  Exercises - Seated Knee Flexion AAROM  - 5 x daily - 7 x weekly - 1 sets - 1 reps - 3 minutes hold - Supine Quadricep Sets  - 5 x daily - 7 x weekly - 2 sets - 10 reps - 5 second hold - Seated Long Arc Quad  - 3-5 x daily - 7 x weekly - 1-2 sets - 10 reps - 2 hold - Supine Knee Extension Mobilization with Weight  - 3-5 x daily - 7 x weekly - 1 sets - 1 reps - to tolerance up to 15 mins hold  ASSESSMENT:  CLINICAL IMPRESSION:  Letisia was more pain-limited with her physical therapy today due to being on her feet all day, increased edema as a result and family stress.  AROM remained 4-0-91 degrees today.  Quadriceps strength, AROM and edema control remain the heavy emphasis with her home and clinic programs.  We were able to progress into some functional activities for sit to stand and stairs.  OBJECTIVE IMPAIRMENTS: Abnormal gait, decreased activity tolerance, decreased endurance, decreased knowledge of condition, difficulty walking, decreased ROM, decreased strength, increased edema, and pain.   ACTIVITY LIMITATIONS: bending, sitting, standing, squatting, sleeping, stairs, and locomotion level  PARTICIPATION LIMITATIONS: cleaning, driving, shopping, and community activity  PERSONAL FACTORS: CABG x 4, previous  left femur fracture in 2020, HTN, HLD, OA, anxiety, left THA in 2020 are also affecting patient's functional outcome.   REHAB POTENTIAL: Good  CLINICAL DECISION MAKING: Stable/uncomplicated  EVALUATION COMPLEXITY: Low   GOALS: Goals reviewed with patient? Yes  SHORT TERM GOALS: Target date: 11/14/2022 [name] will improve her right knee AROM to 2-0-90 degrees  Goal status: Partially Met 11/07/2022  2.  [name] will be independent with her day 1 home exercise program  Goal status: Met 10/31/2022   LONG TERM GOALS: Target date: 12/12/2022  [name] will improve her right knee AROM to 0-110 degrees Baseline: 5-0-63 degrees Goal status: On Going 11/09/2022  2.  Improve right quadriceps strength to at least 40 pounds Baseline: 18 pounds Goal status: On Going 11/07/2022  3.  Jeslie will be independent and comfortable without the use of an assistive device Baseline: Using a wheeled walker at evaluation Goal status: Partially Met 11/07/2022  4.  Improve FOTO to recommended value by discharge Baseline: Will be completed visit 2 Goal status: On Going 11/07/2022  5.  [name] will be independent and compliant with a long-term home exercise program at discharge Baseline: Started 10/17/2022 Goal status: On Going 11/09/2022   PLAN:  PT FREQUENCY: 2-3x/week  PT DURATION: 8 weeks  PLANNED INTERVENTIONS: Therapeutic exercises, Therapeutic activity, Neuromuscular re-education, Balance training, Gait training, Patient/Family education, Self Care, Joint mobilization, Stair training, Cryotherapy, Vasopneumatic device, and Manual therapy  PLAN FOR NEXT SESSION: AROM, edema control and quadriceps strengthening.  Balance and functional activities as AROM progresses.   Cherlyn Cushing PT, MPT 11/09/22  1:46 PM

## 2022-11-12 ENCOUNTER — Other Ambulatory Visit (HOSPITAL_COMMUNITY): Payer: Self-pay

## 2022-11-12 ENCOUNTER — Ambulatory Visit (INDEPENDENT_AMBULATORY_CARE_PROVIDER_SITE_OTHER): Payer: HMO | Admitting: Orthopaedic Surgery

## 2022-11-12 ENCOUNTER — Other Ambulatory Visit: Payer: Self-pay | Admitting: Cardiology

## 2022-11-12 ENCOUNTER — Encounter: Payer: Self-pay | Admitting: Orthopaedic Surgery

## 2022-11-12 DIAGNOSIS — Z96651 Presence of right artificial knee joint: Secondary | ICD-10-CM

## 2022-11-12 MED ORDER — METHYLPREDNISOLONE ACETATE 40 MG/ML IJ SUSP
40.0000 mg | INTRAMUSCULAR | Status: AC | PRN
  Administered 2022-11-12: 40 mg via INTRA_ARTICULAR

## 2022-11-12 MED ORDER — LIDOCAINE HCL 1 % IJ SOLN
3.0000 mL | INTRAMUSCULAR | Status: AC | PRN
  Administered 2022-11-12: 3 mL

## 2022-11-12 NOTE — Progress Notes (Signed)
   Procedure Note  Patient: Debbie Cardenas             Date of Birth: 1946/01/06           MRN: [id number]             Visit Date: 11/12/2022 HPI: Mrs. Halbur returns today for follow-up of her right knee.  She is 6 weeks status post right total knee arthroplasty.  Unfortunately her brother passed away just 3 days ago.  She is overall just had a rough time with having to be rehospitalized with A-fib and some confusion most likely due to pain medicines.  She is now taking just tramadol for pain.  States her pain is 9 out of 10 with therapy.   Physical exam: Right knee full extension flexion to 90 degrees.  Surgical incisions healing well no signs of infection. Procedures: Visit Diagnoses:  1. Status post total right knee replacement     Large Joint Inj: R knee on 11/12/2022 11:27 AM Indications: pain Details: 22 G 1.5 in needle, anterolateral approach  Arthrogram: No  Medications: 3 mL lidocaine 1 %; 40 mg methylPREDNISolone acetate 40 MG/ML Outcome: tolerated well, no immediate complications Procedure, treatment alternatives, risks and benefits explained, specific risks discussed. Consent was given by the patient. Immediately prior to procedure a time out was called to verify the correct patient, procedure, equipment, support staff and site/side marked as required. Patient was prepped and draped in the usual sterile fashion.    Follow-up in just 2 weeks with Dr. Raye Sorrow see how she is doing overall.  She is really not interested in any type of manipulation.  Hopefully the steroid injection will help her get the knee moving.  Questions were encouraged and answered by Dr. Magnus Ivan and myself.

## 2022-11-13 ENCOUNTER — Other Ambulatory Visit: Payer: Self-pay | Admitting: Physician Assistant

## 2022-11-14 ENCOUNTER — Ambulatory Visit (INDEPENDENT_AMBULATORY_CARE_PROVIDER_SITE_OTHER): Payer: HMO | Admitting: Rehabilitative and Restorative Service Providers"

## 2022-11-14 ENCOUNTER — Encounter: Payer: Self-pay | Admitting: Rehabilitative and Restorative Service Providers"

## 2022-11-14 DIAGNOSIS — M25561 Pain in right knee: Secondary | ICD-10-CM

## 2022-11-14 DIAGNOSIS — R262 Difficulty in walking, not elsewhere classified: Secondary | ICD-10-CM

## 2022-11-14 DIAGNOSIS — M25661 Stiffness of right knee, not elsewhere classified: Secondary | ICD-10-CM | POA: Diagnosis not present

## 2022-11-14 DIAGNOSIS — R600 Localized edema: Secondary | ICD-10-CM

## 2022-11-14 DIAGNOSIS — M6281 Muscle weakness (generalized): Secondary | ICD-10-CM | POA: Diagnosis not present

## 2022-11-14 NOTE — Telephone Encounter (Signed)
Eliquis  refill request received. Patient is 77 years old, weight-68kg, Crea-0.77 on 10/11/22, Diagnosis-Afib, and last seen by Edd Fabian on 10/18/22. Dose is appropriate based on dosing criteria. Will send in refill to requested pharmacy.

## 2022-11-14 NOTE — Therapy (Signed)
OUTPATIENT PHYSICAL THERAPY TREATMENT NOTE  Patient Name: Debbie Cardenas MRN: [id number] DOB:Jun 23, 1946, 77 y.o., female Today's Date: 11/14/2022  END OF SESSION:  PT End of Session - 11/14/22 1428     Visit Number 8    Date for PT Re-Evaluation 12/12/22    Authorization Type [employer] $10 copay    Authorization - Visit Number 8    Progress Note Due on Visit 10    PT Start Time 1430    PT Stop Time 1525    PT Time Calculation (min) 55 min    Activity Tolerance Patient tolerated treatment well;No increased pain;Patient limited by pain    Behavior During Therapy [facility] for tasks assessed/performed              Past Medical History:  Diagnosis Date   Anxiety    Arthritis    knees   Closed fracture of neck of left femur 08/15/2018   Coronary artery disease    Hyperlipidemia    Hypertension    Pneumonia    Past Surgical History:  Procedure Laterality Date   APPENDECTOMY     CARDIAC CATHETERIZATION     CATARACT EXTRACTION, BILATERAL     CHOLECYSTECTOMY     CORONARY ARTERY BYPASS GRAFT N/A 08/15/2020   Procedure: CORONARY ARTERY BYPASS GRAFTING (CABG)X4. USING BILATERAL MAMMARY ARTERIES AND RIGHT GREATER SAPHENOUS VEIN HARVESTED ENDOSCOPICALLY.;  Surgeon: Alleen Borne, MD;  Location: [facility] OR;  Service: Open Heart Surgery;  Laterality: N/A;   EYE SURGERY     bilateral cataract removal   KNEE SURGERY     LEFT HEART CATH AND CORONARY ANGIOGRAPHY N/A 07/27/2020   Procedure: LEFT HEART CATH AND CORONARY ANGIOGRAPHY;  Surgeon: [place], Peter M, MD;  Location: [facility] INVASIVE CV LAB;  Service: Cardiovascular;  Laterality: N/A;   TEE WITHOUT CARDIOVERSION N/A 08/15/2020   Procedure: TRANSESOPHAGEAL ECHOCARDIOGRAM (TEE);  Surgeon: Alleen Borne, MD;  Location: [facility] OR;  Service: Open Heart Surgery;  Laterality: N/A;   TOTAL HIP ARTHROPLASTY Left 08/15/2018   Procedure: TOTAL HIP ARTHROPLASTY ANTERIOR APPROACH;  Surgeon: Kathryne Hitch, MD;  Location: [facility] ORS;  Service:  Orthopedics;  Laterality: Left;   TOTAL KNEE ARTHROPLASTY Right 10/02/2022   Procedure: RIGHT TOTAL KNEE ARTHROPLASTY;  Surgeon: Kathryne Hitch, MD;  Location: [facility] OR;  Service: Orthopedics;  Laterality: Right;   Patient Active Problem List   Diagnosis Date Noted   Delirium 10/10/2022   Atrial fibrillation with RVR 10/09/2022   Chest pain 10/09/2022   Elevated troponin 10/09/2022   Pulmonary edema 10/09/2022   Hypokalemia 10/09/2022   Normocytic anemia 10/09/2022   Memory loss 10/09/2022   Status post total right knee replacement 10/02/2022   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   Coronary artery disease 08/15/2020   Coronary artery 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 07/27/2020   HTN (hypertension) 07/27/2020   Hyperlipidemia 07/27/2020   Unilateral primary osteoarthritis, left knee 03/26/2019   Status post total replacement of left hip 08/28/2018   Hip fracture 08/15/2018   Closed fracture of neck of left femur 08/15/2018    PCP: Theora Gianotti, PA  REFERRING PROVIDER: Kathryne Hitch, MD  REFERRING DIAG:  Diagnosis  [phone] (ICD-10-CM) - Status post total right knee replacement    THERAPY DIAG:  Difficulty in walking, not elsewhere classified  Stiffness of right knee, not elsewhere classified  Muscle weakness (generalized)  Right knee pain, unspecified chronicity  Localized edema  Rationale for Evaluation and  Treatment: Rehabilitation  ONSET DATE: October 02, 2022  SUBJECTIVE:   SUBJECTIVE STATEMENT: Chandani reports very good HEP compliance since her last PT visit.  She got a cortisone shot and will see Bronson Curb 11/26/2022 to see if she needs a manipulation.  PERTINENT HISTORY: CABG x 4, previous left femur fracture in 2020, HTN, HLD, OA, anxiety, left THA in 2020  PAIN:  NPRS scale: 0-5/10 this week, can be higher with exercises but pain is short-lived Pain location: Right knee Pain description: Ache, tight, sore and  throbbing Aggravating factors: Sleeping, prolonged postures and hurts all the time Relieving factors: Ice and exercise  PRECAUTIONS: None  WEIGHT BEARING RESTRICTIONS: No  FALLS:  Has patient fallen in last 6 months? No  LIVING ENVIRONMENT: Lives with: lives with their family and lives with their spouse Lives in: House/apartment Stairs:  Does OK with handrail Has following equipment at home: Single point cane, Environmental consultant - 2 wheeled, and Grab bars  OCCUPATION: Retired, return to going to concerts  PLOF: Independent  PATIENT GOALS: Return to normal household activities without pain or use of an assistive device.   OBJECTIVE:   DIAGNOSTIC FINDINGS: 10/17/2022 review of chart:  2 views of the left knee show tricompartment arthritis with bone-on-bone  wear of all 3 compartments.  There are large osteophytes in all 3  compartments and significant joint space narrowing.  2 views of the right knee show severe tricompartment arthritis.  There are  osteophytes in all 3 compartments, a mild effusion and severe joint space  narrowing.  PATIENT SURVEYS:  10/17/2022 FOTO was not recorded on first visit or 2nd visit so deferred  COGNITION: 10/17/2022 Overall cognitive status: Within functional limits for tasks assessed     SENSATION: 10/17/2022 No complaints of peripheral pain or paresthesias  EDEMA:  3/20/2024Noted and not objectively assessed.  LOWER EXTREMITY ROM:  Active ROM in degrees Left/Right 10/17/2022 Right 10/25/2022 AROM Right 10/31/2022 Right 11/02/2022 Right 11/07/2022 Right 11/14/2022  Hip flexion        Hip extension        Hip abduction        Hip adduction        Hip internal rotation        Hip external rotation        Knee flexion 112/63 70 deg in supine heel slide 82 degrees with PT assist PROM 83 91 98  Knee extension 0/-5 -18 AROM in seated LAQ   -4 AROM (lacking between 3 and 4 degrees) -4 -4 -3  Ankle dorsiflexion        Ankle plantarflexion        Ankle  inversion        Ankle eversion         (Blank rows = not tested)  LOWER EXTREMITY STRENGTH:  STRENGTH IN POUNDS Left/Right 10/17/2022 Left/Right 11/07/2022  Hip flexion    Hip extension    Hip abduction    Hip adduction    Hip internal rotation    Hip external rotation    Knee flexion    Knee extension 36.9/18.3 38.6/27.6  Ankle dorsiflexion    Ankle plantarflexion    Ankle inversion    Ankle eversion     (Blank rows = not tested)   GAIT: 4/10/20204: No AD  10/25/2022: SPC use.  10/17/2022 Distance walked: Within the clinic Assistive device utilized: Walker - 2 wheeled Level of assistance: Complete Independence Comments: [place] was assistive device free before surgery   TODAY'S TREATMENT:  DATE:  11/14/2022 Tailgate knee flexion 1 minute AAROM knee flexion (left pushes right into flexion) 10X 10 seconds with PT overpressure Quadriceps sets with right heel prop 2 sets of 10 for 5 seconds Supine knee flexion with belt and PT overpressure 10X 10 seconds Recumbent bike Seat 5 full revolutions 4 minutes and Seat 4 for 4 minutes  Functional Activities: Double Leg Press 50# 10X full extension and stretch into flexion Single Leg Press 25# 10X right only 10X full extension and stretch into flexion  Vaso right knee 10 minutes Medium 34*   11/07/2022 Tailgate knee flexion 1 minute AAROM knee flexion (left pushes right into flexion) 10X 10 seconds with PT overpressure Quadriceps sets with right heel prop 2 sets of 10 for 5 seconds Supine knee flexion with belt and PT overpressure 10X 10 seconds Recumbent bike Seat 5 AAROM to full revolutions 10 CW (clockwise) and 10X CCW (counter clockwise)  Functional Activities: Double Leg Press 50# 10X full extension and stretch into flexion Single Leg Press 25# 10X right only 10X full extension and stretch into flexion  Vaso right knee 10 minutes Medium 34*   10/31/2022 Tailgate knee flexion  1 minute AAROM knee flexion (left pushes right into flexion) 10X 10 seconds with PT overpressure Quadriceps sets with right heel prop 3 sets of 10 for 5 seconds Supine knee flexion with belt and PT overpressure 10X 10 seconds Recumbent bike Seat 5 AAROM to full revolutions 5 minutes  Vaso right knee 10 minutes Medium 34*   PATIENT EDUCATION:  10/25/2022 Education details: HEP update Person educated: Patient  Education method: Programmer, multimedia, Demonstration, Verbal cues, and Handouts Education comprehension: verbalized understanding, returned demonstration, verbal cues required  HOME EXERCISE PROGRAM: Access Code: [email or web address] URL: [email or web address] Date: 10/25/2022 Prepared by: Chyrel Masson  Exercises - Seated Knee Flexion AAROM  - 5 x daily - 7 x weekly - 1 sets - 1 reps - 3 minutes hold - Supine Quadricep Sets  - 5 x daily - 7 x weekly - 2 sets - 10 reps - 5 second hold - Seated Long Arc Quad  - 3-5 x daily - 7 x weekly - 1-2 sets - 10 reps - 2 hold - Supine Knee Extension Mobilization with Weight  - 3-5 x daily - 7 x weekly - 1 sets - 1 reps - to tolerance up to 15 mins hold  ASSESSMENT:  CLINICAL IMPRESSION:  Mikisha was less pain-limited with her physical therapy today.  AROM improved to 3-0-98 degrees today.  Quadriceps strength, AROM and edema control remain the heavy emphasis with her home and clinic programs.  Continue POC to avoid manipulation if possible.  OBJECTIVE IMPAIRMENTS: Abnormal gait, decreased activity tolerance, decreased endurance, decreased knowledge of condition, difficulty walking, decreased ROM, decreased strength, increased edema, and pain.   ACTIVITY LIMITATIONS: bending, sitting, standing, squatting, sleeping, stairs, and locomotion level  PARTICIPATION LIMITATIONS: cleaning, driving, shopping, and community activity  PERSONAL FACTORS: CABG x 4, previous left femur fracture in 2020, HTN, HLD, OA, anxiety, left THA in 2020 are also  affecting patient's functional outcome.   REHAB POTENTIAL: Good  CLINICAL DECISION MAKING: Stable/uncomplicated  EVALUATION COMPLEXITY: Low   GOALS: Goals reviewed with patient? Yes  SHORT TERM GOALS: Target date: 11/14/2022 [name] will improve her right knee AROM to 2-0-90 degrees  Goal status: Partially Met 11/07/2022  2.  [name] will be independent with her day 1 home exercise program  Goal status: Met 10/31/2022   LONG TERM GOALS: Target date: 12/12/2022  [name] will improve her right knee AROM to 0-110 degrees Baseline: 5-0-63 degrees Goal status: On Going 11/14/2022  2.  Improve right quadriceps strength to at least 40 pounds Baseline: 18 pounds Goal status: On Going 11/07/2022  3.  Cortlyn will be independent and comfortable without the use of an assistive device Baseline: Using a wheeled walker at evaluation Goal status: Partially Met 11/07/2022  4.  Improve FOTO to recommended value by discharge Baseline: Will be completed visit 2 Goal status: On Going 11/07/2022  5.  [name] will be independent and compliant with a long-term home exercise program at discharge Baseline: Started 10/17/2022 Goal status: On Going 11/14/2022   PLAN:  PT FREQUENCY: 2-3x/week  PT DURATION: 8 weeks  PLANNED INTERVENTIONS: Therapeutic exercises, Therapeutic activity, Neuromuscular re-education, Balance training, Gait training, Patient/Family education, Self Care, Joint mobilization, Stair training, Cryotherapy, Vasopneumatic device, and Manual therapy  PLAN FOR NEXT SESSION: AROM, edema control and quadriceps strengthening.  Balance and functional activities as AROM progresses.   Cherlyn Cushing PT, MPT 11/14/22  3:15 PM

## 2022-11-16 ENCOUNTER — Encounter: Payer: Self-pay | Admitting: Rehabilitative and Restorative Service Providers"

## 2022-11-16 ENCOUNTER — Ambulatory Visit (INDEPENDENT_AMBULATORY_CARE_PROVIDER_SITE_OTHER): Payer: HMO | Admitting: Rehabilitative and Restorative Service Providers"

## 2022-11-16 DIAGNOSIS — M25561 Pain in right knee: Secondary | ICD-10-CM

## 2022-11-16 DIAGNOSIS — R262 Difficulty in walking, not elsewhere classified: Secondary | ICD-10-CM

## 2022-11-16 DIAGNOSIS — M6281 Muscle weakness (generalized): Secondary | ICD-10-CM

## 2022-11-16 DIAGNOSIS — R600 Localized edema: Secondary | ICD-10-CM

## 2022-11-16 DIAGNOSIS — M25661 Stiffness of right knee, not elsewhere classified: Secondary | ICD-10-CM

## 2022-11-16 NOTE — Therapy (Signed)
OUTPATIENT PHYSICAL THERAPY TREATMENT NOTE  Patient Name: Debbie Cardenas MRN: [id number] DOB:[birth year], 77 y.o., female Today's Date: 11/16/2022  END OF SESSION:  PT End of Session - 11/16/22 1423     Visit Number 9    Date for PT Re-Evaluation 12/12/22    Authorization Type [employer] $10 copay    Authorization - Visit Number 9    Progress Note Due on Visit 10    PT Start Time 1421    PT Stop Time 1515    PT Time Calculation (min) 54 min    Activity Tolerance Patient tolerated treatment well;No increased pain;Patient limited by pain    Behavior During Therapy Debbie Cardenas for tasks assessed/performed              Past Medical History:  Diagnosis Date   Anxiety    Arthritis    knees   Closed fracture of neck of left femur 08/15/2018   Coronary artery disease    Hyperlipidemia    Hypertension    Pneumonia    Past Surgical History:  Procedure Laterality Date   APPENDECTOMY     CARDIAC CATHETERIZATION     CATARACT EXTRACTION, BILATERAL     CHOLECYSTECTOMY     CORONARY ARTERY BYPASS GRAFT N/A 08/15/2020   Procedure: CORONARY ARTERY BYPASS GRAFTING (CABG)X4. USING BILATERAL MAMMARY ARTERIES AND RIGHT GREATER SAPHENOUS VEIN HARVESTED ENDOSCOPICALLY.;  Surgeon: Alleen Borne, MD;  Location: [facility] OR;  Service: Open Heart Surgery;  Laterality: N/A;   EYE SURGERY     bilateral cataract removal   KNEE SURGERY     LEFT HEART CATH AND CORONARY ANGIOGRAPHY N/A 07/27/2020   Procedure: LEFT HEART CATH AND CORONARY ANGIOGRAPHY;  Surgeon: Swazil[place], Peter M, MD;  Location: [facility] INVASIVE CV LAB;  Service: Cardiovascular;  Laterality: N/A;   TEE WITHOUT CARDIOVERSION N/A 08/15/2020   Procedure: TRANSESOPHAGEAL ECHOCARDIOGRAM (TEE);  Surgeon: Alleen Borne, MD;  Location: [facility] OR;  Service: Open Heart Surgery;  Laterality: N/A;   TOTAL HIP ARTHROPLASTY Left 08/15/2018   Procedure: TOTAL HIP ARTHROPLASTY ANTERIOR APPROACH;  Surgeon: Kathryne Hitch, MD;  Location: [facility] ORS;  Service:  Orthopedics;  Laterality: Left;   TOTAL KNEE ARTHROPLASTY Right 10/02/2022   Procedure: RIGHT TOTAL KNEE ARTHROPLASTY;  Surgeon: Kathryne Hitch, MD;  Location: [facility] OR;  Service: Orthopedics;  Laterality: Right;   Patient Active Problem List   Diagnosis Date Noted   Delirium 10/10/2022   Atrial fibrillation with RVR 10/09/2022   Chest pain 10/09/2022   Elevated troponin 10/09/2022   Pulmonary edema 10/09/2022   Hypokalemia 10/09/2022   Normocytic anemia 10/09/2022   Memory loss 10/09/2022   Status post total right knee replacement 10/02/2022   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   Coronary artery disease 08/15/2020   Coronary artery 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 07/27/2020   HTN (hypertension) 07/27/2020   Hyperlipidemia 07/27/2020   Unilateral primary osteoarthritis, left knee 03/26/2019   Status post total replacement of left hip 08/28/2018   Hip fracture 08/15/2018   Closed fracture of neck of left femur 08/15/2018    PCP: Theora Gianotti, PA  REFERRING PROVIDER: Kathryne Hitch, MD  REFERRING DIAG:  Diagnosis  [phone] (ICD-10-CM) - Status post total right knee replacement    THERAPY DIAG:  Difficulty in walking, not elsewhere classified  Stiffness of right knee, not elsewhere classified  Muscle weakness (generalized)  Right knee pain, unspecified chronicity  Localized edema  Rationale for Evaluation and  Treatment: Rehabilitation  ONSET DATE: October 02, 2022  SUBJECTIVE:   SUBJECTIVE STATEMENT: Debbie Cardenas reports continued HEP compliance since her last PT visit.  She sees Debbie Cardenas 11/26/2022 to see if she needs a manipulation.  PERTINENT HISTORY: CABG x 4, previous left femur fracture in 2020, HTN, HLD, OA, anxiety, left THA in 2020  PAIN:  NPRS scale: 0-5/10 this week, can be higher with exercises but pain is short-lived Pain location: Right knee Pain description: Ache, tight, sore and throbbing Aggravating factors:  Sleeping, prolonged postures and hurts all the time Relieving factors: Ice and exercise  PRECAUTIONS: None  WEIGHT BEARING RESTRICTIONS: No  FALLS:  Has patient fallen in last 6 months? No  LIVING ENVIRONMENT: Lives with: lives with their family and lives with their spouse Lives in: House/apartment Stairs:  Does OK with handrail Has following equipment at home: Single point cane, Environmental consultant - 2 wheeled, and Grab bars  OCCUPATION: Retired, return to going to concerts  PLOF: Independent  PATIENT GOALS: Return to normal household activities without pain or use of an assistive device.   OBJECTIVE:   DIAGNOSTIC FINDINGS: 10/17/2022 review of chart:  2 views of the left knee show tricompartment arthritis with bone-on-bone  wear of all 3 compartments.  There are large osteophytes in all 3  compartments and significant joint space narrowing.  2 views of the right knee show severe tricompartment arthritis.  There are  osteophytes in all 3 compartments, a mild effusion and severe joint space  narrowing.  PATIENT SURVEYS:  10/17/2022 FOTO was not recorded on first visit or 2nd visit so deferred  COGNITION: 10/17/2022 Overall cognitive status: Within functional limits for tasks assessed     SENSATION: 10/17/2022 No complaints of peripheral pain or paresthesias  EDEMA:  3/20/2024Noted and not objectively assessed.  LOWER EXTREMITY ROM:  Active ROM in degrees Left/Right 10/17/2022 Right 10/25/2022 AROM Right 10/31/2022 Right 11/02/2022 Right 11/07/2022 Right 11/14/2022 Right 11/16/2022  Hip flexion         Hip extension         Hip abduction         Hip adduction         Hip internal rotation         Hip external rotation         Knee flexion 112/63 70 deg in supine heel slide 82 degrees with PT assist PROM 83 91 98   Knee extension 0/-5 -18 AROM in seated LAQ   -4 AROM (lacking between 3 and 4 degrees) -4 -4 -3   Ankle dorsiflexion         Ankle plantarflexion         Ankle  inversion         Ankle eversion          (Blank rows = not tested)  LOWER EXTREMITY STRENGTH:  STRENGTH IN POUNDS Left/Right 10/17/2022 Left/Right 11/07/2022  Hip flexion    Hip extension    Hip abduction    Hip adduction    Hip internal rotation    Hip external rotation    Knee flexion    Knee extension 36.9/18.3 38.6/27.6  Ankle dorsiflexion    Ankle plantarflexion    Ankle inversion    Ankle eversion     (Blank rows = not tested)   GAIT: 4/10/20204: No AD  10/25/2022: SPC use.  10/17/2022 Distance walked: Within the clinic Assistive device utilized: Walker - 2 wheeled Level of assistance: Complete Independence Comments: Debbie Cardenas was assistive device free  before surgery   TODAY'S TREATMENT:                                             DATE:  11/16/2022 Tailgate knee flexion 1 minute AAROM knee flexion (left pushes right into flexion) 10X 10 seconds with PT overpressure Quadriceps sets with right heel prop 3 sets of 10 for 5 seconds Supine knee flexion with belt and PT overpressure 5X 10 seconds Recumbent bike Seat 5 full revolutions 4 minutes and Seat 4 for 4 minutes  Functional Activities: Double Leg Press 56# 15X full extension and stretch into flexion Single Leg Press 25# 10X right only 10X full extension and stretch into flexion  Vaso right knee 10 minutes Medium 34*   11/14/2022 Tailgate knee flexion 1 minute AAROM knee flexion (left pushes right into flexion) 10X 10 seconds with PT overpressure Quadriceps sets with right heel prop 2 sets of 10 for 5 seconds Supine knee flexion with belt and PT overpressure 10X 10 seconds Recumbent bike Seat 5 full revolutions 4 minutes and Seat 4 for 4 minutes  Functional Activities: Double Leg Press 50# 10X full extension and stretch into flexion Single Leg Press 25# 10X right only 10X full extension and stretch into flexion  Vaso right knee 10 minutes Medium 34*   11/07/2022 Tailgate knee flexion 1 minute AAROM knee  flexion (left pushes right into flexion) 10X 10 seconds with PT overpressure Quadriceps sets with right heel prop 2 sets of 10 for 5 seconds Supine knee flexion with belt and PT overpressure 10X 10 seconds Recumbent bike Seat 5 AAROM to full revolutions 10 CW (clockwise) and 10X CCW (counter clockwise)  Functional Activities: Double Leg Press 50# 10X full extension and stretch into flexion Single Leg Press 25# 10X right only 10X full extension and stretch into flexion  Vaso right knee 10 minutes Medium 34*   PATIENT EDUCATION:  10/25/2022 Education details: HEP update Person educated: Patient  Education method: Programmer, multimedia, Demonstration, Verbal cues, and Handouts Education comprehension: verbalized understanding, returned demonstration, verbal cues required  HOME EXERCISE PROGRAM: Access Code: [email or web address] URL: [email or web address] Date: 10/25/2022 Prepared by: Chyrel Masson  Exercises - Seated Knee Flexion AAROM  - 5 x daily - 7 x weekly - 1 sets - 1 reps - 3 minutes hold - Supine Quadricep Sets  - 5 x daily - 7 x weekly - 2 sets - 10 reps - 5 second hold - Seated Long Arc Quad  - 3-5 x daily - 7 x weekly - 1-2 sets - 10 reps - 2 hold - Supine Knee Extension Mobilization with Weight  - 3-5 x daily - 7 x weekly - 1 sets - 1 reps - to tolerance up to 15 mins hold  ASSESSMENT:  CLINICAL IMPRESSION:  Manami improved to 3-0-100 degrees today.  Quadriceps strength, AROM and edema control remain the emphasis with her home and clinic programs.  Continue POC with progress note/FOTO next Wednesday.  OBJECTIVE IMPAIRMENTS: Abnormal gait, decreased activity tolerance, decreased endurance, decreased knowledge of condition, difficulty walking, decreased ROM, decreased strength, increased edema, and pain.   ACTIVITY LIMITATIONS: bending, sitting, standing, squatting, sleeping, stairs, and locomotion level  PARTICIPATION LIMITATIONS: cleaning, driving, shopping, and community  activity  PERSONAL FACTORS: CABG x 4, previous left femur fracture in 2020, HTN, HLD, OA, anxiety, left THA in 2020 are also affecting patient's functional outcome.  REHAB POTENTIAL: Good  CLINICAL DECISION MAKING: Stable/uncomplicated  EVALUATION COMPLEXITY: Low   GOALS: Goals reviewed with patient? Yes  SHORT TERM GOALS: Target date: 11/14/2022 Debbie Cardenas will improve her right knee AROM to 2-0-90 degrees  Goal status: Partially Met 11/07/2022  2.  Debbie Cardenas will be independent with her day 1 home exercise program  Goal status: Met 10/31/2022   LONG TERM GOALS: Target date: 12/12/2022  Debbie Cardenas will improve her right knee AROM to 0-110 degrees Baseline: 5-0-63 degrees Goal status: On Going 11/14/2022  2.  Improve right quadriceps strength to at least 40 pounds Baseline: 18 pounds Goal status: On Going 11/07/2022  3.  Yahaira will be independent and comfortable without the use of an assistive device Baseline: Using a wheeled walker at evaluation Goal status: Partially Met 11/07/2022  4.  Improve FOTO to recommended value by discharge Baseline: Will be completed visit 2 Goal status: On Going 11/07/2022  5.  Debbie Cardenas will be independent and compliant with a long-term home exercise program at discharge Baseline: Started 10/17/2022 Goal status: On Going 11/14/2022   PLAN:  PT FREQUENCY: 2-3x/week  PT DURATION: 8 weeks  PLANNED INTERVENTIONS: Therapeutic exercises, Therapeutic activity, Neuromuscular re-education, Balance training, Gait training, Patient/Family education, Self Care, Joint mobilization, Stair training, Cryotherapy, Vasopneumatic device, and Manual therapy  PLAN FOR NEXT SESSION: AROM, edema control and quadriceps strengthening.  Balance and functional activities as AROM progresses.  Progress note/FOTO next visit.   Debbie Cardenas PT, MPT 11/16/22  3:06 PM

## 2022-11-21 ENCOUNTER — Ambulatory Visit (INDEPENDENT_AMBULATORY_CARE_PROVIDER_SITE_OTHER): Payer: HMO | Admitting: Rehabilitative and Restorative Service Providers"

## 2022-11-21 ENCOUNTER — Encounter: Payer: Self-pay | Admitting: Rehabilitative and Restorative Service Providers"

## 2022-11-21 DIAGNOSIS — R262 Difficulty in walking, not elsewhere classified: Secondary | ICD-10-CM

## 2022-11-21 DIAGNOSIS — R600 Localized edema: Secondary | ICD-10-CM

## 2022-11-21 DIAGNOSIS — M25661 Stiffness of right knee, not elsewhere classified: Secondary | ICD-10-CM

## 2022-11-21 DIAGNOSIS — M6281 Muscle weakness (generalized): Secondary | ICD-10-CM | POA: Diagnosis not present

## 2022-11-21 DIAGNOSIS — M25561 Pain in right knee: Secondary | ICD-10-CM

## 2022-11-21 NOTE — Therapy (Signed)
OUTPATIENT PHYSICAL THERAPY TREATMENT/PROGRESS NOTE  Patient Name: Debbie Cardenas MRN: [id number] DOB:1946-05-04, 77 y.o., female Today's Date: 11/21/2022  END OF SESSION:  PT End of Session - 11/21/22 1300     Visit Number 10    Date for PT Re-Evaluation 12/12/22    Authorization Type [employer] $10 copay    Authorization - Visit Number 10    Progress Note Due on Visit 20    PT Start Time 1259    PT Stop Time 1355    PT Time Calculation (min) 56 min    Activity Tolerance Patient tolerated treatment well;No increased pain;Patient limited by pain    Behavior During Therapy [facility] for tasks assessed/performed            Progress Note Reporting Period 10/17/2022 to 11/21/2022  See note below for Objective Data and Assessment of Progress/Goals.       Past Medical History:  Diagnosis Date   Anxiety    Arthritis    knees   Closed fracture of neck of left femur 08/15/2018   Coronary artery disease    Hyperlipidemia    Hypertension    Pneumonia    Past Surgical History:  Procedure Laterality Date   APPENDECTOMY     CARDIAC CATHETERIZATION     CATARACT EXTRACTION, BILATERAL     CHOLECYSTECTOMY     CORONARY ARTERY BYPASS GRAFT N/A 08/15/2020   Procedure: CORONARY ARTERY BYPASS GRAFTING (CABG)X4. USING BILATERAL MAMMARY ARTERIES AND RIGHT GREATER SAPHENOUS VEIN HARVESTED ENDOSCOPICALLY.;  Surgeon: Alleen Borne, MD;  Location: [facility] OR;  Service: Open Heart Surgery;  Laterality: N/A;   EYE SURGERY     bilateral cataract removal   KNEE SURGERY     LEFT HEART CATH AND CORONARY ANGIOGRAPHY N/A 07/27/2020   Procedure: LEFT HEART CATH AND CORONARY ANGIOGRAPHY;  Surgeon: [place], Peter M, MD;  Location: [facility] INVASIVE CV LAB;  Service: Cardiovascular;  Laterality: N/A;   TEE WITHOUT CARDIOVERSION N/A 08/15/2020   Procedure: TRANSESOPHAGEAL ECHOCARDIOGRAM (TEE);  Surgeon: Alleen Borne, MD;  Location: [facility] OR;  Service: Open Heart Surgery;  Laterality: N/A;   TOTAL HIP ARTHROPLASTY  Left 08/15/2018   Procedure: TOTAL HIP ARTHROPLASTY ANTERIOR APPROACH;  Surgeon: Kathryne Hitch, MD;  Location: [facility] ORS;  Service: Orthopedics;  Laterality: Left;   TOTAL KNEE ARTHROPLASTY Right 10/02/2022   Procedure: RIGHT TOTAL KNEE ARTHROPLASTY;  Surgeon: Kathryne Hitch, MD;  Location: [facility] OR;  Service: Orthopedics;  Laterality: Right;   Patient Active Problem List   Diagnosis Date Noted   Delirium 10/10/2022   Atrial fibrillation with RVR 10/09/2022   Chest pain 10/09/2022   Elevated troponin 10/09/2022   Pulmonary edema 10/09/2022   Hypokalemia 10/09/2022   Normocytic anemia 10/09/2022   Memory loss 10/09/2022   Status post total right knee replacement 10/02/2022   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   Coronary artery disease 08/15/2020   Coronary artery 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 07/27/2020   HTN (hypertension) 07/27/2020   Hyperlipidemia 07/27/2020   Unilateral primary osteoarthritis, left knee 03/26/2019   Status post total replacement of left hip 08/28/2018   Hip fracture 08/15/2018   Closed fracture of neck of left femur 08/15/2018    PCP: Theora Gianotti, PA  REFERRING PROVIDER: Kathryne Hitch, MD  REFERRING DIAG:  Diagnosis  [phone] (ICD-10-CM) - Status post total right knee replacement    THERAPY DIAG:  Difficulty in walking, not elsewhere classified - Plan: PT plan  of care cert/re-cert  Stiffness of right knee, not elsewhere classified - Plan: PT plan of care cert/re-cert  Muscle weakness (generalized) - Plan: PT plan of care cert/re-cert  Right knee pain, unspecified chronicity - Plan: PT plan of care cert/re-cert  Localized edema - Plan: PT plan of care cert/re-cert  Rationale for Evaluation and Treatment: Rehabilitation  ONSET DATE: October 02, 2022  SUBJECTIVE:   SUBJECTIVE STATEMENT: Debbie Cardenas reports HEP compliance was not as good since her last PT visit due to planning her brother's  funeral.  She sees Bronson Curb 11/26/2022 to see if she needs a manipulation.  Overall, home exercise program compliance has been good, although there have been periods of time with less consistency.  PERTINENT HISTORY: CABG x 4, previous left femur fracture in 2020, HTN, HLD, OA, anxiety, left THA in 2020  PAIN:  NPRS scale: 0-5/10 this week, can be higher with exercises but pain is short-lived.  She is trying to cut back on pain pills before bed, only taking them before PT. Pain location: Right knee Pain description: Ache, tight, sore and throbbing Aggravating factors: Sleeping, prolonged postures and hurts all the time Relieving factors: Ice and exercise  PRECAUTIONS: None  WEIGHT BEARING RESTRICTIONS: No  FALLS:  Has patient fallen in last 6 months? No  LIVING ENVIRONMENT: Lives with: lives with their family and lives with their spouse Lives in: House/apartment Stairs:  Does OK with handrail Has following equipment at home: Single point cane, Environmental consultant - 2 wheeled, and Grab bars  OCCUPATION: Retired, return to going to concerts  PLOF: Independent  PATIENT GOALS: Return to normal household activities without pain or use of an assistive device.   OBJECTIVE:   DIAGNOSTIC FINDINGS: 10/17/2022 review of chart:  2 views of the left knee show tricompartment arthritis with bone-on-bone  wear of all 3 compartments.  There are large osteophytes in all 3  compartments and significant joint space narrowing.  2 views of the right knee show severe tricompartment arthritis.  There are  osteophytes in all 3 compartments, a mild effusion and severe joint space  narrowing.  PATIENT SURVEYS:  11/21/2022  FOTO 65 (was 46, Goal 66)  10/17/2022 FOTO was not recorded on first visit or 2nd visit so deferred  COGNITION: 10/17/2022 Overall cognitive status: Within functional limits for tasks assessed     SENSATION: 10/17/2022 No complaints of peripheral pain or paresthesias  EDEMA:   3/20/2024Noted and not objectively assessed.  LOWER EXTREMITY ROM:  Active ROM in degrees Left/Right 10/17/2022 Right 10/25/2022 AROM Right 10/31/2022 Right 11/02/2022 Right 11/07/2022 Right 11/14/2022 Right 11/21/2022  Hip flexion         Hip extension         Hip abduction         Hip adduction         Hip internal rotation         Hip external rotation         Knee flexion 112/63 70 deg in supine heel slide 82 degrees with PT assist PROM 83 91 98 98  Knee extension 0/-5 -18 AROM in seated LAQ   -4 AROM (lacking between 3 and 4 degrees) -4 -4 -3 -2  Ankle dorsiflexion         Ankle plantarflexion         Ankle inversion         Ankle eversion          (Blank rows = not tested)  LOWER EXTREMITY STRENGTH:  STRENGTH IN POUNDS Left/Right 10/17/2022 Left/Right 11/07/2022 Left/Right 11/21/2022  Hip flexion     Hip extension     Hip abduction     Hip adduction     Hip internal rotation     Hip external rotation     Knee flexion     Knee extension 36.9/18.3 38.6/27.6 38.3/23.4  Ankle dorsiflexion     Ankle plantarflexion     Ankle inversion     Ankle eversion      (Blank rows = not tested)   GAIT: 4/10/20204: No AD  10/25/2022: SPC use.  10/17/2022 Distance walked: Within the clinic Assistive device utilized: Walker - 2 wheeled Level of assistance: Complete Independence Comments: Debbie Cardenas was assistive device free before surgery   TODAY'S TREATMENT:                                             DATE:  11/21/2022 [place]gate knee flexion 1 minute AAROM knee flexion (left pushes right into flexion) 15X 10 seconds with PT overpressure Quadriceps sets with right heel prop 2 sets of 10 for 5 seconds Supine knee flexion with belt and PT overpressure 5X 10 seconds Recumbent bike Seat 5 full revolutions 3 minutes and Seat 4 for 3 minutes  Functional Activities: Double Leg Press 62# 15X full extension and stretch into flexion Single Leg Press 31# 10X right only 10X full extension and  stretch into flexion  Vaso right knee 10 minutes Medium 34*   11/16/2022 [place] knee flexion 1 minute AAROM knee flexion (left pushes right into flexion) 10X 10 seconds with PT overpressure Quadriceps sets with right heel prop 3 sets of 10 for 5 seconds Supine knee flexion with belt and PT overpressure 5X 10 seconds Recumbent bike Seat 5 full revolutions 4 minutes and Seat 4 for 4 minutes  Functional Activities: Double Leg Press 56# 15X full extension and stretch into flexion Single Leg Press 25# 10X right only 10X full extension and stretch into flexion  Vaso right knee 10 minutes Medium 34*   11/14/2022 [place] knee flexion 1 minute AAROM knee flexion (left pushes right into flexion) 10X 10 seconds with PT overpressure Quadriceps sets with right heel prop 2 sets of 10 for 5 seconds Supine knee flexion with belt and PT overpressure 10X 10 seconds Recumbent bike Seat 5 full revolutions 4 minutes and Seat 4 for 4 minutes  Functional Activities: Double Leg Press 50# 10X full extension and stretch into flexion Single Leg Press 25# 10X right only 10X full extension and stretch into flexion  Vaso right knee 10 minutes Medium 34*   PATIENT EDUCATION:  10/25/2022 Education details: HEP update Person educated: Patient  Education method: Programmer, multimedia, Demonstration, Verbal cues, and Handouts Education comprehension: verbalized understanding, returned demonstration, verbal cues required  HOME EXERCISE PROGRAM: Access Code: [email or web address] URL: [email or web address] Date: 10/25/2022 Prepared by: Chyrel Masson  Exercises - Seated Knee Flexion AAROM  - 5 x daily - 7 x weekly - 1 sets - 1 reps - 3 minutes hold - Supine Quadricep Sets  - 5 x daily - 7 x weekly - 2 sets - 10 reps - 5 second hold - Seated Long Arc Quad  - 3-5 x daily - 7 x weekly - 1-2 sets - 10 reps - 2 hold - Supine Knee Extension Mobilization with Weight  - 3-5 x daily - 7 x weekly - 1  sets - 1 reps - to  tolerance up to 15 mins hold  ASSESSMENT:  CLINICAL IMPRESSION: Debbie Cardenas has AROM at 2-0-98 degrees.  Her effort is good, but she does have some pain limitations in regards to her flexion active range of motion.  Quadriceps strength is appropriate at this time with the involved side being 61% of the uninvolved left.  Flexion active range of motion, quadriceps strength, balance and more advanced functional activities continue to be the focus of Debbie Cardenas's rehabilitation.  With another month of physical therapy, I expect she will meet long-term goals.  She will follow-up with Bronson Curb tomorrow and they will discuss whether or not manipulation is recommended.   OBJECTIVE IMPAIRMENTS: Abnormal gait, decreased activity tolerance, decreased endurance, decreased knowledge of condition, difficulty walking, decreased ROM, decreased strength, increased edema, and pain.   ACTIVITY LIMITATIONS: bending, sitting, standing, squatting, sleeping, stairs, and locomotion level  PARTICIPATION LIMITATIONS: cleaning, driving, shopping, and community activity  PERSONAL FACTORS: CABG x 4, previous left femur fracture in 2020, HTN, HLD, OA, anxiety, left THA in 2020 are also affecting patient's functional outcome.   REHAB POTENTIAL: Good  CLINICAL DECISION MAKING: Stable/uncomplicated  EVALUATION COMPLEXITY: Low   GOALS: Goals reviewed with patient? Yes  SHORT TERM GOALS: Target date: 11/14/2022 Debbie Cardenas will improve her right knee AROM to 2-0-90 degrees  Goal status: Met 11/21/2022  2.  Debbie Cardenas will be independent with her day 1 home exercise program  Goal status: Met 10/31/2022   LONG TERM GOALS: Target date: 12/19/2022  Debbie Cardenas will improve her right knee AROM to 0-110 degrees Baseline: 5-0-63 degrees Goal status: On Going 11/21/2022  2.  Improve right quadriceps strength to at least 40 pounds Baseline: 18 pounds Goal status: On Going 11/21/2022  3.  Debbie Cardenas will be independent and comfortable without the use of an  assistive device Baseline: Using a wheeled walker at evaluation Goal status: Met 11/21/2022  4.  Improve FOTO to recommended value by discharge Baseline: Will be completed visit 2 Goal status: On Going 11/21/2022  5.  Debbie Cardenas will be independent and compliant with a long-term home exercise program at discharge Baseline: Started 10/17/2022 Goal status: On Going 11/21/2022   PLAN:  PT FREQUENCY: 2x/week  PT DURATION: 4 weeks  PLANNED INTERVENTIONS: Therapeutic exercises, Therapeutic activity, Neuromuscular re-education, Balance training, Gait training, Patient/Family education, Self Care, Joint mobilization, Stair training, Cryotherapy, Vasopneumatic device, and Manual therapy  PLAN FOR NEXT SESSION: Flexion AROM, edema control and quadriceps strengthening.  Balance and functional activities as AROM progresses.   Cherlyn Cushing PT, MPT 11/21/22  5:34 PM

## 2022-11-26 ENCOUNTER — Encounter: Payer: Self-pay | Admitting: Physician Assistant

## 2022-11-26 ENCOUNTER — Ambulatory Visit (INDEPENDENT_AMBULATORY_CARE_PROVIDER_SITE_OTHER): Payer: HMO | Admitting: Physician Assistant

## 2022-11-26 DIAGNOSIS — Z96651 Presence of right artificial knee joint: Secondary | ICD-10-CM

## 2022-11-26 NOTE — Progress Notes (Signed)
HPI: [name] returns today for follow-up of her right total knee arthroplasty which she underwent on 10/02/2022.  She feels the injection helped and feels that overall her knee is 40% better.  She is continuing going to therapy.  Notes from therapy states that she is flexing to 98 degrees.  She is taking tramadol for pain.  Again she does not want to undergo any more surgery as she has had some problems with memory since the last surgery.  She becomes very tearful when talking about this.  Physical exam: General well-developed well-nourished female who is able to get on and off the exam table on her own.  No assistive device. Right knee full extension flexion 95 degrees.  No instability valgus varus stressing.  Surgical incisions healing well no signs of infection.  Impression: Status post right total knee arthroplasty  Plan: Recommend she continue to work on range of motion and strengthening.  Will see her back in 4 weeks see how she is doing overall.  Questions were encouraged and answered

## 2022-11-28 ENCOUNTER — Encounter: Payer: Self-pay | Admitting: Rehabilitative and Restorative Service Providers"

## 2022-11-28 ENCOUNTER — Ambulatory Visit (INDEPENDENT_AMBULATORY_CARE_PROVIDER_SITE_OTHER): Payer: HMO | Admitting: Rehabilitative and Restorative Service Providers"

## 2022-11-28 DIAGNOSIS — R262 Difficulty in walking, not elsewhere classified: Secondary | ICD-10-CM

## 2022-11-28 DIAGNOSIS — M25661 Stiffness of right knee, not elsewhere classified: Secondary | ICD-10-CM | POA: Diagnosis not present

## 2022-11-28 DIAGNOSIS — M25561 Pain in right knee: Secondary | ICD-10-CM | POA: Diagnosis not present

## 2022-11-28 DIAGNOSIS — R600 Localized edema: Secondary | ICD-10-CM

## 2022-11-28 DIAGNOSIS — M6281 Muscle weakness (generalized): Secondary | ICD-10-CM | POA: Diagnosis not present

## 2022-11-28 NOTE — Therapy (Signed)
OUTPATIENT PHYSICAL THERAPY TREATMENT NOTE  Patient Name: RAILYN HOUSE MRN: [id number] DOB:23-Apr-1946, 77 y.o., female Today's Date: 11/28/2022  END OF SESSION:  PT End of Session - 11/28/22 1426     Visit Number 11    Date for PT Re-Evaluation 12/12/22    Authorization Type [employer] $10 copay    Authorization - Visit Number 11    Progress Note Due on Visit 20    PT Start Time 1424    PT Stop Time 1522    PT Time Calculation (min) 58 min    Activity Tolerance Patient tolerated treatment well;No increased pain;Patient limited by pain    Behavior During Therapy [facility] for tasks assessed/performed             Past Medical History:  Diagnosis Date   Anxiety    Arthritis    knees   Closed fracture of neck of left femur (HCC) 08/15/2018   Coronary artery disease    Hyperlipidemia    Hypertension    Pneumonia    Past Surgical History:  Procedure Laterality Date   APPENDECTOMY     CARDIAC CATHETERIZATION     CATARACT EXTRACTION, BILATERAL     CHOLECYSTECTOMY     CORONARY ARTERY BYPASS GRAFT N/A 08/15/2020   Procedure: CORONARY ARTERY BYPASS GRAFTING (CABG)X4. USING BILATERAL MAMMARY ARTERIES AND RIGHT GREATER SAPHENOUS VEIN HARVESTED ENDOSCOPICALLY.;  Surgeon: Alleen Borne, MD;  Location: [facility] OR;  Service: Open Heart Surgery;  Laterality: N/A;   EYE SURGERY     bilateral cataract removal   KNEE SURGERY     LEFT HEART CATH AND CORONARY ANGIOGRAPHY N/A 07/27/2020   Procedure: LEFT HEART CATH AND CORONARY ANGIOGRAPHY;  Surgeon: Swaziland, Peter M, MD;  Location: [facility] INVASIVE CV LAB;  Service: Cardiovascular;  Laterality: N/A;   TEE WITHOUT CARDIOVERSION N/A 08/15/2020   Procedure: TRANSESOPHAGEAL ECHOCARDIOGRAM (TEE);  Surgeon: Alleen Borne, MD;  Location: [facility] OR;  Service: Open Heart Surgery;  Laterality: N/A;   TOTAL HIP ARTHROPLASTY Left 08/15/2018   Procedure: TOTAL HIP ARTHROPLASTY ANTERIOR APPROACH;  Surgeon: Kathryne Hitch, MD;  Location: [facility] ORS;  Service:  Orthopedics;  Laterality: Left;   TOTAL KNEE ARTHROPLASTY Right 10/02/2022   Procedure: RIGHT TOTAL KNEE ARTHROPLASTY;  Surgeon: Kathryne Hitch, MD;  Location: [facility] OR;  Service: Orthopedics;  Laterality: Right;   Patient Active Problem List   Diagnosis Date Noted   Delirium 10/10/2022   Atrial fibrillation with RVR (HCC) 10/09/2022   Chest pain 10/09/2022   Elevated troponin 10/09/2022   Pulmonary edema 10/09/2022   Hypokalemia 10/09/2022   Normocytic anemia 10/09/2022   Memory loss 10/09/2022   Status post total right knee replacement 10/02/2022   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   Coronary artery disease 08/15/2020   Coronary 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) 07/27/2020   HTN (hypertension) 07/27/2020   Hyperlipidemia 07/27/2020   Unilateral primary osteoarthritis, left knee 03/26/2019   Status post total replacement of left hip 08/28/2018   Hip fracture (HCC) 08/15/2018   Closed fracture of neck of left femur (HCC) 08/15/2018    PCP: Theora Gianotti, PA  REFERRING PROVIDER: Kathryne Hitch, MD  REFERRING DIAG:  Diagnosis  [phone] (ICD-10-CM) - Status post total right knee replacement    THERAPY DIAG:  Difficulty in walking, not elsewhere classified  Stiffness of right knee, not elsewhere classified  Muscle weakness (generalized)  Right knee pain, unspecified chronicity  Localized edema  Rationale for Evaluation and Treatment: Rehabilitation  ONSET DATE: October 02, 2022  SUBJECTIVE:   SUBJECTIVE STATEMENT: Rilei reports she followed-up with [facility] Monday.  She would like to try another 2 weeks of PT before agreeing to a manipulation due to her bad reaction to anesthesia with her TKA.  PERTINENT HISTORY: CABG x 4, previous left femur fracture in 2020, HTN, HLD, OA, anxiety, left THA in 2020  PAIN:  NPRS scale: 0-5/10 this week, can be higher with exercises but pain is short-lived.  She is trying to cut  back on pain pills before bed, only taking them before PT. Pain location: Right knee Pain description: Ache, tight, sore and throbbing Aggravating factors: Sleeping, prolonged postures and hurts all the time Relieving factors: Ice and exercise  PRECAUTIONS: None  WEIGHT BEARING RESTRICTIONS: No  FALLS:  Has patient fallen in last 6 months? No  LIVING ENVIRONMENT: Lives with: lives with their family and lives with their spouse Lives in: House/apartment Stairs:  Does OK with handrail Has following equipment at home: Single point cane, Environmental consultant - 2 wheeled, and Grab bars  OCCUPATION: Retired, return to going to concerts  PLOF: Independent  PATIENT GOALS: Return to normal household activities without pain or use of an assistive device.   OBJECTIVE:   DIAGNOSTIC FINDINGS: 10/17/2022 review of chart:  2 views of the left knee show tricompartment arthritis with bone-on-bone  wear of all 3 compartments.  There are large osteophytes in all 3  compartments and significant joint space narrowing.  2 views of the right knee show severe tricompartment arthritis.  There are  osteophytes in all 3 compartments, a mild effusion and severe joint space  narrowing.  PATIENT SURVEYS:  11/21/2022  FOTO 65 (was 46, Goal 66)  10/17/2022 FOTO was not recorded on first visit or 2nd visit so deferred  COGNITION: 10/17/2022 Overall cognitive status: Within functional limits for tasks assessed     SENSATION: 10/17/2022 No complaints of peripheral pain or paresthesias  EDEMA:  3/20/2024Noted and not objectively assessed.  LOWER EXTREMITY ROM:  Active ROM in degrees Left/Right 10/17/2022 Right 10/25/2022 AROM Right 10/31/2022 Right 11/02/2022 Right 11/07/2022 Right 11/14/2022 Right 11/21/2022 Right 11/28/2022  Hip flexion          Hip extension          Hip abduction          Hip adduction          Hip internal rotation          Hip external rotation          Knee flexion 112/63 70 deg in supine  heel slide 82 degrees with PT assist PROM 83 91 98 98 98  Knee extension 0/-5 -18 AROM in seated LAQ   -4 AROM (lacking between 3 and 4 degrees) -4 -4 -3 -2 -2  Ankle dorsiflexion          Ankle plantarflexion          Ankle inversion          Ankle eversion           (Blank rows = not tested)  LOWER EXTREMITY STRENGTH:  STRENGTH IN POUNDS Left/Right 10/17/2022 Left/Right 11/07/2022 Left/Right 11/21/2022  Hip flexion     Hip extension     Hip abduction     Hip adduction     Hip internal rotation     Hip external rotation     Knee flexion     Knee extension  36.9/18.3 38.6/27.6 38.3/23.4  Ankle dorsiflexion     Ankle plantarflexion     Ankle inversion     Ankle eversion      (Blank rows = not tested)   GAIT: 4/10/20204: No AD  10/25/2022: SPC use.  10/17/2022 Distance walked: Within the clinic Assistive device utilized: Walker - 2 wheeled Level of assistance: Complete Independence Comments: [place] was assistive device free before surgery   TODAY'S TREATMENT:                                             DATE:  11/28/2022 Tailgate knee flexion 1 minute AAROM knee flexion (left pushes right into flexion) 15X 10 seconds with PT overpressure Quadriceps sets with right heel prop 30 for 5 seconds Supine knee flexion with belt and PT overpressure 10X 10 seconds Recumbent bike Seat 5 full revolutions 3 minutes and Seat 4 for 3 minutes  Vaso right knee 10 minutes Medium 34*   11/21/2022 Tailgate knee flexion 1 minute AAROM knee flexion (left pushes right into flexion) 15X 10 seconds with PT overpressure Quadriceps sets with right heel prop 2 sets of 10 for 5 seconds Supine knee flexion with belt and PT overpressure 5X 10 seconds Recumbent bike Seat 5 full revolutions 3 minutes and Seat 4 for 3 minutes  Functional Activities: Double Leg Press 62# 15X full extension and stretch into flexion Single Leg Press 31# 10X right only 10X full extension and stretch into flexion  Vaso  right knee 10 minutes Medium 34*   11/16/2022 Tailgate knee flexion 1 minute AAROM knee flexion (left pushes right into flexion) 10X 10 seconds with PT overpressure Quadriceps sets with right heel prop 3 sets of 10 for 5 seconds Supine knee flexion with belt and PT overpressure 5X 10 seconds Recumbent bike Seat 5 full revolutions 4 minutes and Seat 4 for 4 minutes  Functional Activities: Double Leg Press 56# 15X full extension and stretch into flexion Single Leg Press 25# 10X right only 10X full extension and stretch into flexion  Vaso right knee 10 minutes Medium 34*   PATIENT EDUCATION:  10/25/2022 Education details: HEP update Person educated: Patient  Education method: Programmer, multimedia, Demonstration, Verbal cues, and Handouts Education comprehension: verbalized understanding, returned demonstration, verbal cues required  HOME EXERCISE PROGRAM: Access Code: [email or web address] URL: [email or web address] Date: 10/25/2022 Prepared by: Chyrel Masson  Exercises - Seated Knee Flexion AAROM  - 5 x daily - 7 x weekly - 1 sets - 1 reps - 3 minutes hold - Supine Quadricep Sets  - 5 x daily - 7 x weekly - 2 sets - 10 reps - 5 second hold - Seated Long Arc Quad  - 3-5 x daily - 7 x weekly - 1-2 sets - 10 reps - 2 hold - Supine Knee Extension Mobilization with Weight  - 3-5 x daily - 7 x weekly - 1 sets - 1 reps - to tolerance up to 15 mins hold  ASSESSMENT:  CLINICAL IMPRESSION: Brittiney has AROM at 2-0-98 degrees again today.  Her effort remains good, but she does have some pain limitations in regards to her flexion active range of motion.  Flexion active range of motion, quadriceps strength, balance and more advanced functional activities continue to be the focus of Kaliyan's rehabilitation with the #1 emphasis remaining flexion AROM to see if she can avoid manipulation.  Due to her apprehension  about anesthesia (bad reaction after TKA), I recommended she attend another 2 weeks of PT  before following-up with [facility] in 2 weeks (vs the 4 weeks scheduled) to prioritize a manipulation if her AROM for flexion isn't at least 105 degrees by May 17.  OBJECTIVE IMPAIRMENTS: Abnormal gait, decreased activity tolerance, decreased endurance, decreased knowledge of condition, difficulty walking, decreased ROM, decreased strength, increased edema, and pain.   ACTIVITY LIMITATIONS: bending, sitting, standing, squatting, sleeping, stairs, and locomotion level  PARTICIPATION LIMITATIONS: cleaning, driving, shopping, and community activity  PERSONAL FACTORS: CABG x 4, previous left femur fracture in 2020, HTN, HLD, OA, anxiety, left THA in 2020 are also affecting patient's functional outcome.   REHAB POTENTIAL: Good  CLINICAL DECISION MAKING: Stable/uncomplicated  EVALUATION COMPLEXITY: Low   GOALS: Goals reviewed with patient? Yes  SHORT TERM GOALS: Target date: 11/14/2022 [name] will improve her right knee AROM to 2-0-90 degrees  Goal status: Met 11/21/2022  2.  [name] will be independent with her day 1 home exercise program  Goal status: Met 10/31/2022   LONG TERM GOALS: Target date: 12/19/2022  [name] will improve her right knee AROM to 0-110 degrees Baseline: 5-0-63 degrees Goal status: On Going 11/28/2022  2.  Improve right quadriceps strength to at least 40 pounds Baseline: 18 pounds Goal status: On Going 11/21/2022  3.  Falecia will be independent and comfortable without the use of an assistive device Baseline: Using a wheeled walker at evaluation Goal status: Met 11/21/2022  4.  Improve FOTO to recommended value by discharge Baseline: Will be completed visit 2 Goal status: On Going 11/21/2022  5.  [name] will be independent and compliant with a long-term home exercise program at discharge Baseline: Started 10/17/2022 Goal status: On Going 11/28/2022   PLAN:  PT FREQUENCY: 2x/week  PT DURATION: 4 weeks  PLANNED INTERVENTIONS: Therapeutic exercises, Therapeutic activity,  Neuromuscular re-education, Balance training, Gait training, Patient/Family education, Self Care, Joint mobilization, Stair training, Cryotherapy, Vasopneumatic device, and Manual therapy  PLAN FOR NEXT SESSION: Flexion AROM, edema control and quadriceps strengthening.  Balance and functional activities as AROM progresses.   Cherlyn Cushing PT, MPT 11/28/22  4:08 PM

## 2022-11-30 ENCOUNTER — Ambulatory Visit (INDEPENDENT_AMBULATORY_CARE_PROVIDER_SITE_OTHER): Payer: HMO | Admitting: Rehabilitative and Restorative Service Providers"

## 2022-11-30 ENCOUNTER — Encounter: Payer: Self-pay | Admitting: Rehabilitative and Restorative Service Providers"

## 2022-11-30 DIAGNOSIS — R262 Difficulty in walking, not elsewhere classified: Secondary | ICD-10-CM

## 2022-11-30 DIAGNOSIS — M25561 Pain in right knee: Secondary | ICD-10-CM | POA: Diagnosis not present

## 2022-11-30 DIAGNOSIS — M25661 Stiffness of right knee, not elsewhere classified: Secondary | ICD-10-CM | POA: Diagnosis not present

## 2022-11-30 DIAGNOSIS — R600 Localized edema: Secondary | ICD-10-CM

## 2022-11-30 DIAGNOSIS — M6281 Muscle weakness (generalized): Secondary | ICD-10-CM

## 2022-11-30 NOTE — Therapy (Signed)
OUTPATIENT PHYSICAL THERAPY TREATMENT NOTE  Patient Name: Debbie Cardenas MRN: [id number] DOB:1945/11/20, 77 y.o., female Today's Date: 11/30/2022  END OF SESSION:  PT End of Session - 11/30/22 1256     Visit Number 12    Date for PT Re-Evaluation 12/12/22    Authorization Type [employer] $10 copay    Authorization - Visit Number 12    Progress Note Due on Visit 20    PT Start Time 1256    PT Stop Time 1350    PT Time Calculation (min) 54 min    Activity Tolerance Patient tolerated treatment well;No increased pain;Patient limited by pain    Behavior During Therapy [facility] for tasks assessed/performed              Past Medical History:  Diagnosis Date   Anxiety    Arthritis    knees   Closed fracture of neck of left femur (HCC) 08/15/2018   Coronary artery disease    Hyperlipidemia    Hypertension    Pneumonia    Past Surgical History:  Procedure Laterality Date   APPENDECTOMY     CARDIAC CATHETERIZATION     CATARACT EXTRACTION, BILATERAL     CHOLECYSTECTOMY     CORONARY ARTERY BYPASS GRAFT N/A 08/15/2020   Procedure: CORONARY ARTERY BYPASS GRAFTING (CABG)X4. USING BILATERAL MAMMARY ARTERIES AND RIGHT GREATER SAPHENOUS VEIN HARVESTED ENDOSCOPICALLY.;  Surgeon: Alleen Borne, MD;  Location: [facility] OR;  Service: Open Heart Surgery;  Laterality: N/A;   EYE SURGERY     bilateral cataract removal   KNEE SURGERY     LEFT HEART CATH AND CORONARY ANGIOGRAPHY N/A 07/27/2020   Procedure: LEFT HEART CATH AND CORONARY ANGIOGRAPHY;  Surgeon: Swaziland, Peter M, MD;  Location: [facility] INVASIVE CV LAB;  Service: Cardiovascular;  Laterality: N/A;   TEE WITHOUT CARDIOVERSION N/A 08/15/2020   Procedure: TRANSESOPHAGEAL ECHOCARDIOGRAM (TEE);  Surgeon: Alleen Borne, MD;  Location: [facility] OR;  Service: Open Heart Surgery;  Laterality: N/A;   TOTAL HIP ARTHROPLASTY Left 08/15/2018   Procedure: TOTAL HIP ARTHROPLASTY ANTERIOR APPROACH;  Surgeon: Kathryne Hitch, MD;  Location: [facility] ORS;   Service: Orthopedics;  Laterality: Left;   TOTAL KNEE ARTHROPLASTY Right 10/02/2022   Procedure: RIGHT TOTAL KNEE ARTHROPLASTY;  Surgeon: Kathryne Hitch, MD;  Location: [facility] OR;  Service: Orthopedics;  Laterality: Right;   Patient Active Problem List   Diagnosis Date Noted   Delirium 10/10/2022   Atrial fibrillation with RVR (HCC) 10/09/2022   Chest pain 10/09/2022   Elevated troponin 10/09/2022   Pulmonary edema 10/09/2022   Hypokalemia 10/09/2022   Normocytic anemia 10/09/2022   Memory loss 10/09/2022   Status post total right knee replacement 10/02/2022   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   Coronary artery disease 08/15/2020   Coronary 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) 07/27/2020   HTN (hypertension) 07/27/2020   Hyperlipidemia 07/27/2020   Unilateral primary osteoarthritis, left knee 03/26/2019   Status post total replacement of left hip 08/28/2018   Hip fracture (HCC) 08/15/2018   Closed fracture of neck of left femur (HCC) 08/15/2018    PCP: Theora Gianotti, PA  REFERRING PROVIDER: Kathryne Hitch, MD  REFERRING DIAG:  Diagnosis  [phone] (ICD-10-CM) - Status post total right knee replacement    THERAPY DIAG:  Difficulty in walking, not elsewhere classified  Stiffness of right knee, not elsewhere classified  Muscle weakness (generalized)  Right knee pain, unspecified chronicity  Localized edema  Rationale for Evaluation and Treatment: Rehabilitation  ONSET DATE: October 02, 2022  SUBJECTIVE:   SUBJECTIVE STATEMENT: [name] reports good HEP compliance and good sleep the past 2 days/nights.  PERTINENT HISTORY: CABG x 4, previous left femur fracture in 2020, HTN, HLD, OA, anxiety, left THA in 2020  PAIN:  NPRS scale: 0-5/10 this week, can be higher with exercises but pain is short-lived.  She is trying to cut back on pain pills before bed, only taking them before PT. Pain location: Right knee Pain  description: Ache, tight, sore and throbbing Aggravating factors: Sleeping, prolonged postures and hurts all the time Relieving factors: Ice and exercise  PRECAUTIONS: None  WEIGHT BEARING RESTRICTIONS: No  FALLS:  Has patient fallen in last 6 months? No  LIVING ENVIRONMENT: Lives with: lives with their family and lives with their spouse Lives in: House/apartment Stairs:  Does OK with handrail Has following equipment at home: Single point cane, Environmental consultant - 2 wheeled, and Grab bars  OCCUPATION: Retired, return to going to concerts  PLOF: Independent  PATIENT GOALS: Return to normal household activities without pain or use of an assistive device.   OBJECTIVE:   DIAGNOSTIC FINDINGS: 10/17/2022 review of chart:  2 views of the left knee show tricompartment arthritis with bone-on-bone  wear of all 3 compartments.  There are large osteophytes in all 3  compartments and significant joint space narrowing.  2 views of the right knee show severe tricompartment arthritis.  There are  osteophytes in all 3 compartments, a mild effusion and severe joint space  narrowing.  PATIENT SURVEYS:  11/21/2022  FOTO 65 (was 46, Goal 66)  10/17/2022 FOTO was not recorded on first visit or 2nd visit so deferred  COGNITION: 10/17/2022 Overall cognitive status: Within functional limits for tasks assessed     SENSATION: 10/17/2022 No complaints of peripheral pain or paresthesias  EDEMA:  3/20/2024Noted and not objectively assessed.  LOWER EXTREMITY ROM:  Active ROM in degrees Left/Right 10/17/2022 Right 10/25/2022 AROM Right 10/31/2022 Right 11/02/2022 Right 11/07/2022 Right 11/14/2022 Right 11/21/2022 Right 11/28/2022  Hip flexion          Hip extension          Hip abduction          Hip adduction          Hip internal rotation          Hip external rotation          Knee flexion 112/63 70 deg in supine heel slide 82 degrees with PT assist PROM 83 91 98 98 98  Knee extension 0/-5 -18 AROM in  seated LAQ   -4 AROM (lacking between 3 and 4 degrees) -4 -4 -3 -2 -2  Ankle dorsiflexion          Ankle plantarflexion          Ankle inversion          Ankle eversion           (Blank rows = not tested)  LOWER EXTREMITY STRENGTH:  STRENGTH IN POUNDS Left/Right 10/17/2022 Left/Right 11/07/2022 Left/Right 11/21/2022  Hip flexion     Hip extension     Hip abduction     Hip adduction     Hip internal rotation     Hip external rotation     Knee flexion     Knee extension 36.9/18.3 38.6/27.6 38.3/23.4  Ankle dorsiflexion     Ankle plantarflexion     Ankle inversion  Ankle eversion      (Blank rows = not tested)   GAIT: 4/10/20204: No AD  10/25/2022: SPC use.  10/17/2022 Distance walked: Within the clinic Assistive device utilized: Walker - 2 wheeled Level of assistance: Complete Independence Comments: [place]ah was assistive device free before surgery   TODAY'S TREATMENT:                                             DATE:  11/30/2022 Tailgate knee flexion 1 minute AAROM knee flexion (left pushes right into flexion) 10X 30 seconds with PT overpressure Quadriceps sets with right heel prop 2 sets of 10 for 5 seconds Supine knee flexion with belt and PT overpressure 5X 30 seconds Recumbent bike Seat 5 full revolutions 3 minutes and Seat 4 for 3 minutes Standing knee flexion stretch on 8 inch step 10X 5 seconds  Functional Activities: Step-up and over 6 and 8 inch step, no hands 6 inch step and left hand with 8 inch step  Vaso right knee 10 minutes Medium 34*   11/28/2022 Tailgate knee flexion 1 minute AAROM knee flexion (left pushes right into flexion) 15X 10 seconds with PT overpressure Quadriceps sets with right heel prop 30 for 5 seconds Supine knee flexion with belt and PT overpressure 10X 10 seconds Recumbent bike Seat 5 full revolutions 3 minutes and Seat 4 for 3 minutes  Vaso right knee 10 minutes Medium 34*   11/21/2022 Tailgate knee flexion 1 minute AAROM knee  flexion (left pushes right into flexion) 15X 10 seconds with PT overpressure Quadriceps sets with right heel prop 2 sets of 10 for 5 seconds Supine knee flexion with belt and PT overpressure 5X 10 seconds Recumbent bike Seat 5 full revolutions 3 minutes and Seat 4 for 3 minutes  Functional Activities: Double Leg Press 62# 15X full extension and stretch into flexion Single Leg Press 31# 10X right only 10X full extension and stretch into flexion  Vaso right knee 10 minutes Medium 34*   PATIENT EDUCATION:  10/25/2022 Education details: HEP update Person educated: Patient  Education method: Programmer, multimedia, Demonstration, Verbal cues, and Handouts Education comprehension: verbalized understanding, returned demonstration, verbal cues required  HOME EXERCISE PROGRAM: Access Code: [email or web address] URL: [email or web address] Date: 10/25/2022 Prepared by: Chyrel Masson  Exercises - Seated Knee Flexion AAROM  - 5 x daily - 7 x weekly - 1 sets - 1 reps - 3 minutes hold - Supine Quadricep Sets  - 5 x daily - 7 x weekly - 2 sets - 10 reps - 5 second hold - Seated Long Arc Quad  - 3-5 x daily - 7 x weekly - 1-2 sets - 10 reps - 2 hold - Supine Knee Extension Mobilization with Weight  - 3-5 x daily - 7 x weekly - 1 sets - 1 reps - to tolerance up to 15 mins hold  ASSESSMENT:  CLINICAL IMPRESSION: Leveda has AROM at 2-0-102 degrees today.  Her HEP compliance was excellent the past few days and we added a longer duration stretch with her clinic exercises.  Flexion active range of motion, quadriceps strength, balance and more advanced functional activities continue to be the focus of Kahmya's rehabilitation with the #1 emphasis remaining flexion AROM.  OBJECTIVE IMPAIRMENTS: Abnormal gait, decreased activity tolerance, decreased endurance, decreased knowledge of condition, difficulty walking, decreased ROM, decreased strength, increased edema, and pain.   ACTIVITY LIMITATIONS: bending,  sitting,  standing, squatting, sleeping, stairs, and locomotion level  PARTICIPATION LIMITATIONS: cleaning, driving, shopping, and community activity  PERSONAL FACTORS: CABG x 4, previous left femur fracture in 2020, HTN, HLD, OA, anxiety, left THA in 2020 are also affecting patient's functional outcome.   REHAB POTENTIAL: Good  CLINICAL DECISION MAKING: Stable/uncomplicated  EVALUATION COMPLEXITY: Low   GOALS: Goals reviewed with patient? Yes  SHORT TERM GOALS: Target date: 11/14/2022 [name] will improve her right knee AROM to 2-0-90 degrees  Goal status: Met 11/21/2022  2.  [name] will be independent with her day 1 home exercise program  Goal status: Met 10/31/2022   LONG TERM GOALS: Target date: 12/19/2022  [name] will improve her right knee AROM to 0-110 degrees Baseline: 5-0-63 degrees Goal status: On Going 11/30/2022  2.  Improve right quadriceps strength to at least 40 pounds Baseline: 18 pounds Goal status: On Going 11/21/2022  3.  Lesha will be independent and comfortable without the use of an assistive device Baseline: Using a wheeled walker at evaluation Goal status: Met 11/21/2022  4.  Improve FOTO to recommended value by discharge Baseline: Will be completed visit 2 Goal status: On Going 11/21/2022  5.  [name] will be independent and compliant with a long-term home exercise program at discharge Baseline: Started 10/17/2022 Goal status: On Going 11/30/2022   PLAN:  PT FREQUENCY: 2x/week  PT DURATION: 4 weeks  PLANNED INTERVENTIONS: Therapeutic exercises, Therapeutic activity, Neuromuscular re-education, Balance training, Gait training, Patient/Family education, Self Care, Joint mobilization, Stair training, Cryotherapy, Vasopneumatic device, and Manual therapy  PLAN FOR NEXT SESSION: Flexion AROM, edema control and quadriceps strengthening.  Balance and functional activities as AROM progresses.  Longer duration hold on flexion stretches.  Cherlyn Cushing PT, MPT 11/30/22  1:44 PM

## 2022-12-05 ENCOUNTER — Ambulatory Visit (INDEPENDENT_AMBULATORY_CARE_PROVIDER_SITE_OTHER): Payer: HMO | Admitting: Rehabilitative and Restorative Service Providers"

## 2022-12-05 ENCOUNTER — Encounter: Payer: Self-pay | Admitting: Rehabilitative and Restorative Service Providers"

## 2022-12-05 DIAGNOSIS — M25561 Pain in right knee: Secondary | ICD-10-CM | POA: Diagnosis not present

## 2022-12-05 DIAGNOSIS — M6281 Muscle weakness (generalized): Secondary | ICD-10-CM | POA: Diagnosis not present

## 2022-12-05 DIAGNOSIS — R262 Difficulty in walking, not elsewhere classified: Secondary | ICD-10-CM

## 2022-12-05 DIAGNOSIS — M25661 Stiffness of right knee, not elsewhere classified: Secondary | ICD-10-CM

## 2022-12-05 DIAGNOSIS — R600 Localized edema: Secondary | ICD-10-CM

## 2022-12-05 NOTE — Therapy (Signed)
OUTPATIENT PHYSICAL THERAPY TREATMENT NOTE  Patient Name: Debbie Cardenas MRN: [id number] DOB:23-Jul-1946, 77 y.o., female Today's Date: 12/05/2022  END OF SESSION:  PT End of Session - 12/05/22 1300     Visit Number 13    Date for PT Re-Evaluation 12/12/22    Authorization Type [employer] $10 copay    Authorization - Visit Number 13    Progress Note Due on Visit 20    PT Start Time 1259    PT Stop Time 1354    PT Time Calculation (min) 55 min    Activity Tolerance Patient tolerated treatment well;No increased pain;Patient limited by pain    Behavior During Therapy [facility] for tasks assessed/performed               Past Medical History:  Diagnosis Date   Anxiety    Arthritis    knees   Closed fracture of neck of left femur (HCC) 08/15/2018   Coronary artery disease    Hyperlipidemia    Hypertension    Pneumonia    Past Surgical History:  Procedure Laterality Date   APPENDECTOMY     CARDIAC CATHETERIZATION     CATARACT EXTRACTION, BILATERAL     CHOLECYSTECTOMY     CORONARY ARTERY BYPASS GRAFT N/A 08/15/2020   Procedure: CORONARY ARTERY BYPASS GRAFTING (CABG)X4. USING BILATERAL MAMMARY ARTERIES AND RIGHT GREATER SAPHENOUS VEIN HARVESTED ENDOSCOPICALLY.;  Surgeon: Alleen Borne, MD;  Location: [facility] OR;  Service: Open Heart Surgery;  Laterality: N/A;   EYE SURGERY     bilateral cataract removal   KNEE SURGERY     LEFT HEART CATH AND CORONARY ANGIOGRAPHY N/A 07/27/2020   Procedure: LEFT HEART CATH AND CORONARY ANGIOGRAPHY;  Surgeon: Swaziland, Peter M, MD;  Location: [facility] INVASIVE CV LAB;  Service: Cardiovascular;  Laterality: N/A;   TEE WITHOUT CARDIOVERSION N/A 08/15/2020   Procedure: TRANSESOPHAGEAL ECHOCARDIOGRAM (TEE);  Surgeon: Alleen Borne, MD;  Location: [facility] OR;  Service: Open Heart Surgery;  Laterality: N/A;   TOTAL HIP ARTHROPLASTY Left 08/15/2018   Procedure: TOTAL HIP ARTHROPLASTY ANTERIOR APPROACH;  Surgeon: Kathryne Hitch, MD;  Location: [facility] ORS;   Service: Orthopedics;  Laterality: Left;   TOTAL KNEE ARTHROPLASTY Right 10/02/2022   Procedure: RIGHT TOTAL KNEE ARTHROPLASTY;  Surgeon: Kathryne Hitch, MD;  Location: [facility] OR;  Service: Orthopedics;  Laterality: Right;   Patient Active Problem List   Diagnosis Date Noted   Delirium 10/10/2022   Atrial fibrillation with RVR (HCC) 10/09/2022   Chest pain 10/09/2022   Elevated troponin 10/09/2022   Pulmonary edema 10/09/2022   Hypokalemia 10/09/2022   Normocytic anemia 10/09/2022   Memory loss 10/09/2022   Status post total right knee replacement 10/02/2022   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   Coronary artery disease 08/15/2020   Coronary 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) 07/27/2020   HTN (hypertension) 07/27/2020   Hyperlipidemia 07/27/2020   Unilateral primary osteoarthritis, left knee 03/26/2019   Status post total replacement of left hip 08/28/2018   Hip fracture (HCC) 08/15/2018   Closed fracture of neck of left femur (HCC) 08/15/2018    PCP: Theora Gianotti, PA  REFERRING PROVIDER: Kathryne Hitch, MD  REFERRING DIAG:  Diagnosis  [phone] (ICD-10-CM) - Status post total right knee replacement    THERAPY DIAG:  Difficulty in walking, not elsewhere classified  Stiffness of right knee, not elsewhere classified  Muscle weakness (generalized)  Right knee pain, unspecified chronicity  Localized  edema  Rationale for Evaluation and Treatment: Rehabilitation  ONSET DATE: October 02, 2022  SUBJECTIVE:   SUBJECTIVE STATEMENT: Sookie reports she is still taking prescription pain medication before bed and before PT.  Good HEP compliance and good sleep the past 7+ days/nights.  PERTINENT HISTORY: CABG x 4, previous left femur fracture in 2020, HTN, HLD, OA, anxiety, left THA in 2020  PAIN:  NPRS scale: 0-5/10 this week, can be higher with exercises but pain is short-lived.  She is trying to cut back on pain pills  before bed, only taking them before PT. Pain location: Right knee Pain description: Ache, tight, sore and throbbing Aggravating factors: Sleeping, prolonged postures and hurts all the time Relieving factors: Ice and exercise  PRECAUTIONS: None  WEIGHT BEARING RESTRICTIONS: No  FALLS:  Has patient fallen in last 6 months? No  LIVING ENVIRONMENT: Lives with: lives with their family and lives with their spouse Lives in: House/apartment Stairs:  Does OK with handrail Has following equipment at home: Single point cane, Environmental consultant - 2 wheeled, and Grab bars  OCCUPATION: Retired, return to going to concerts  PLOF: Independent  PATIENT GOALS: Return to normal household activities without pain or use of an assistive device.   OBJECTIVE:   DIAGNOSTIC FINDINGS: 10/17/2022 review of chart:  2 views of the left knee show tricompartment arthritis with bone-on-bone  wear of all 3 compartments.  There are large osteophytes in all 3  compartments and significant joint space narrowing.  2 views of the right knee show severe tricompartment arthritis.  There are  osteophytes in all 3 compartments, a mild effusion and severe joint space  narrowing.  PATIENT SURVEYS:  11/21/2022  FOTO 65 (was 46, Goal 66)  10/17/2022 FOTO was not recorded on first visit or 2nd visit so deferred  COGNITION: 10/17/2022 Overall cognitive status: Within functional limits for tasks assessed     SENSATION: 10/17/2022 No complaints of peripheral pain or paresthesias  EDEMA:  3/20/2024Noted and not objectively assessed.  LOWER EXTREMITY ROM:  Active ROM in degrees Left/Right 10/17/2022 Right 10/25/2022 AROM Right 10/31/2022 Right 11/02/2022 Right 11/07/2022 Right 11/14/2022 Right 11/21/2022 Right 11/28/2022 Right 12/05/2022  Hip flexion           Hip extension           Hip abduction           Hip adduction           Hip internal rotation           Hip external rotation           Knee flexion 112/63 70 deg in supine  heel slide 82 degrees with PT assist PROM 83 91 98 98 98 106 AAROM  Knee extension 0/-5 -18 AROM in seated LAQ   -4 AROM (lacking between 3 and 4 degrees) -4 -4 -3 -2 -2 -2 AROM  Ankle dorsiflexion           Ankle plantarflexion           Ankle inversion           Ankle eversion            (Blank rows = not tested)  LOWER EXTREMITY STRENGTH:  STRENGTH IN POUNDS Left/Right 10/17/2022 Left/Right 11/07/2022 Left/Right 11/21/2022  Hip flexion     Hip extension     Hip abduction     Hip adduction     Hip internal rotation     Hip external rotation  Knee flexion     Knee extension 36.9/18.3 38.6/27.6 38.3/23.4  Ankle dorsiflexion     Ankle plantarflexion     Ankle inversion     Ankle eversion      (Blank rows = not tested)   GAIT: 4/10/20204: No AD  10/25/2022: SPC use.  10/17/2022 Distance walked: Within the clinic Assistive device utilized: Walker - 2 wheeled Level of assistance: Complete Independence Comments: Meenakshi was assistive device free before surgery   TODAY'S TREATMENT:                                             DATE:  12/04/2021 Tailgate knee flexion 1 minute AAROM knee flexion (left pushes right into flexion) 2 sets of 5X 30 seconds with PT overpressure Quadriceps sets with right heel prop 3 sets of 10 for 5 seconds Supine knee flexion with belt and PT overpressure 10X 10 seconds Recumbent bike Seat 5 full revolutions 2 minutes and Seat 4 for 2 minutes Standing knee flexion stretch on 8 inch step 10X 5 seconds  Vaso right knee 10 minutes Medium 34*   11/30/2022 Tailgate knee flexion 1 minute AAROM knee flexion (left pushes right into flexion) 10X 30 seconds with PT overpressure Quadriceps sets with right heel prop 2 sets of 10 for 5 seconds Supine knee flexion with belt and PT overpressure 5X 30 seconds Recumbent bike Seat 5 full revolutions 3 minutes and Seat 4 for 3 minutes Standing knee flexion stretch on 8 inch step 10X 5 seconds  Functional  Activities: Step-up and over 6 and 8 inch step, no hands 6 inch step and left hand with 8 inch step  Vaso right knee 10 minutes Medium 34*   11/28/2022 Tailgate knee flexion 1 minute AAROM knee flexion (left pushes right into flexion) 15X 10 seconds with PT overpressure Quadriceps sets with right heel prop 30 for 5 seconds Supine knee flexion with belt and PT overpressure 10X 10 seconds Recumbent bike Seat 5 full revolutions 3 minutes and Seat 4 for 3 minutes  Vaso right knee 10 minutes Medium 34*   PATIENT EDUCATION:  10/25/2022 Education details: HEP update Person educated: Patient  Education method: Programmer, multimedia, Demonstration, Verbal cues, and Handouts Education comprehension: verbalized understanding, returned demonstration, verbal cues required  HOME EXERCISE PROGRAM: Access Code: [email or web address] URL: [email or web address] Date: 10/25/2022 Prepared by: Chyrel Masson  Exercises - Seated Knee Flexion AAROM  - 5 x daily - 7 x weekly - 1 sets - 1 reps - 3 minutes hold - Supine Quadricep Sets  - 5 x daily - 7 x weekly - 2 sets - 10 reps - 5 second hold - Seated Long Arc Quad  - 3-5 x daily - 7 x weekly - 1-2 sets - 10 reps - 2 hold - Supine Knee Extension Mobilization with Weight  - 3-5 x daily - 7 x weekly - 1 sets - 1 reps - to tolerance up to 15 mins hold  ASSESSMENT:  CLINICAL IMPRESSION: Quamesha has AAROM at 2-0-106 degrees today.  HEP compliance is good overall and the a longer duration stretch with her clinic exercises appears to be facilitating ROM gains.  Flexion active range of motion, quadriceps strength, balance and more advanced functional activities continue to be the focus of Maurianna's rehabilitation with the #1 emphasis remaining flexion AROM.  OBJECTIVE IMPAIRMENTS: Abnormal gait, decreased activity tolerance, decreased endurance, decreased knowledge  of condition, difficulty walking, decreased ROM, decreased strength, increased edema, and pain.   ACTIVITY  LIMITATIONS: bending, sitting, standing, squatting, sleeping, stairs, and locomotion level  PARTICIPATION LIMITATIONS: cleaning, driving, shopping, and community activity  PERSONAL FACTORS: CABG x 4, previous left femur fracture in 2020, HTN, HLD, OA, anxiety, left THA in 2020 are also affecting patient's functional outcome.   REHAB POTENTIAL: Good  CLINICAL DECISION MAKING: Stable/uncomplicated  EVALUATION COMPLEXITY: Low   GOALS: Goals reviewed with patient? Yes  SHORT TERM GOALS: Target date: 11/14/2022 [name] will improve her right knee AROM to 2-0-90 degrees  Goal status: Met 11/21/2022  2.  [name] will be independent with her day 1 home exercise program  Goal status: Met 10/31/2022   LONG TERM GOALS: Target date: 12/19/2022  [name] will improve her right knee AROM to 0-110 degrees Baseline: 5-0-63 degrees Goal status: On Going 12/05/2022  2.  Improve right quadriceps strength to at least 40 pounds Baseline: 18 pounds Goal status: On Going 11/21/2022  3.  Giannie will be independent and comfortable without the use of an assistive device Baseline: Using a wheeled walker at evaluation Goal status: Met 11/21/2022  4.  Improve FOTO to recommended value by discharge Baseline: Will be completed visit 2 Goal status: On Going 11/21/2022  5.  [name] will be independent and compliant with a long-term home exercise program at discharge Baseline: Started 10/17/2022 Goal status: On Going 12/05/2022   PLAN:  PT FREQUENCY: 2x/week  PT DURATION: 4 weeks  PLANNED INTERVENTIONS: Therapeutic exercises, Therapeutic activity, Neuromuscular re-education, Balance training, Gait training, Patient/Family education, Self Care, Joint mobilization, Stair training, Cryotherapy, Vasopneumatic device, and Manual therapy  PLAN FOR NEXT SESSION: Flexion AROM, edema control and quadriceps strengthening.  Balance, stairs and functional activities on 12/07/2022 to make sure there are no issues here.  Longer duration  hold on flexion stretches.  Cherlyn Cushing PT, MPT 12/05/22  2:51 PM

## 2022-12-07 ENCOUNTER — Ambulatory Visit (INDEPENDENT_AMBULATORY_CARE_PROVIDER_SITE_OTHER): Payer: HMO | Admitting: Rehabilitative and Restorative Service Providers"

## 2022-12-07 ENCOUNTER — Encounter: Payer: Self-pay | Admitting: Rehabilitative and Restorative Service Providers"

## 2022-12-07 DIAGNOSIS — M25561 Pain in right knee: Secondary | ICD-10-CM | POA: Diagnosis not present

## 2022-12-07 DIAGNOSIS — M25661 Stiffness of right knee, not elsewhere classified: Secondary | ICD-10-CM

## 2022-12-07 DIAGNOSIS — R600 Localized edema: Secondary | ICD-10-CM

## 2022-12-07 DIAGNOSIS — M6281 Muscle weakness (generalized): Secondary | ICD-10-CM

## 2022-12-07 DIAGNOSIS — R262 Difficulty in walking, not elsewhere classified: Secondary | ICD-10-CM

## 2022-12-07 NOTE — Therapy (Signed)
OUTPATIENT PHYSICAL THERAPY TREATMENT/RE-CERTIFICATION NOTE  Patient Name: Debbie Cardenas MRN: [id number] DOB:1945-08-14, 77 y.o., female Today's Date: 12/07/2022  Progress Note Reporting Period 10/17/2022 to 12/07/2022  See note below for Objective Data and Assessment of Progress/Goals.      END OF SESSION:  PT End of Session - 12/07/22 1259     Visit Number 14    Date for PT Re-Evaluation 12/28/22    Authorization Type Health Team $10 copay    Authorization - Visit Number 14    Progress Note Due on Visit 20    PT Start Time 1258    PT Stop Time 1349    PT Time Calculation (min) 51 min    Equipment Utilized During Treatment Gait belt    Activity Tolerance Patient tolerated treatment well;No increased pain    Behavior During Therapy [facility] for tasks assessed/performed              Past Medical History:  Diagnosis Date   Anxiety    Arthritis    knees   Closed fracture of neck of left femur (HCC) 08/15/2018   Coronary artery disease    Hyperlipidemia    Hypertension    Pneumonia    Past Surgical History:  Procedure Laterality Date   APPENDECTOMY     CARDIAC CATHETERIZATION     CATARACT EXTRACTION, BILATERAL     CHOLECYSTECTOMY     CORONARY ARTERY BYPASS GRAFT N/A 08/15/2020   Procedure: CORONARY ARTERY BYPASS GRAFTING (CABG)X4. USING BILATERAL MAMMARY ARTERIES AND RIGHT GREATER SAPHENOUS VEIN HARVESTED ENDOSCOPICALLY.;  Surgeon: Alleen Borne, MD;  Location: [facility] OR;  Service: Open Heart Surgery;  Laterality: N/A;   EYE SURGERY     bilateral cataract removal   KNEE SURGERY     LEFT HEART CATH AND CORONARY ANGIOGRAPHY N/A 07/27/2020   Procedure: LEFT HEART CATH AND CORONARY ANGIOGRAPHY;  Surgeon: Swazil[place], Peter M, MD;  Location: [facility] INVASIVE CV LAB;  Service: Cardiovascular;  Laterality: N/A;   TEE WITHOUT CARDIOVERSION N/A 08/15/2020   Procedure: TRANSESOPHAGEAL ECHOCARDIOGRAM (TEE);  Surgeon: Alleen Borne, MD;  Location: [facility] OR;  Service: Open Heart Surgery;   Laterality: N/A;   TOTAL HIP ARTHROPLASTY Left 08/15/2018   Procedure: TOTAL HIP ARTHROPLASTY ANTERIOR APPROACH;  Surgeon: Kathryne Hitch, MD;  Location: [facility] ORS;  Service: Orthopedics;  Laterality: Left;   TOTAL KNEE ARTHROPLASTY Right 10/02/2022   Procedure: RIGHT TOTAL KNEE ARTHROPLASTY;  Surgeon: Kathryne Hitch, MD;  Location: [facility] OR;  Service: Orthopedics;  Laterality: Right;   Patient Active Problem List   Diagnosis Date Noted   Delirium 10/10/2022   Atrial fibrillation with RVR (HCC) 10/09/2022   Chest pain 10/09/2022   Elevated troponin 10/09/2022   Pulmonary edema 10/09/2022   Hypokalemia 10/09/2022   Normocytic anemia 10/09/2022   Memory loss 10/09/2022   Status post total right knee replacement 10/02/2022   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   Coronary artery disease 08/15/2020   Coronary 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) 07/27/2020   HTN (hypertension) 07/27/2020   Hyperlipidemia 07/27/2020   Unilateral primary osteoarthritis, left knee 03/26/2019   Status post total replacement of left hip 08/28/2018   Hip fracture (HCC) 08/15/2018   Closed fracture of neck of left femur (HCC) 08/15/2018    PCP: Theora Gianotti, PA  REFERRING PROVIDER: Kathryne Hitch, MD  REFERRING DIAG:  Diagnosis  [phone] (ICD-10-CM) - Status post total right knee replacement    THERAPY  DIAG:  Difficulty in walking, not elsewhere classified - Plan: PT plan of care cert/re-cert  Stiffness of right knee, not elsewhere classified - Plan: PT plan of care cert/re-cert  Muscle weakness (generalized) - Plan: PT plan of care cert/re-cert  Right knee pain, unspecified chronicity - Plan: PT plan of care cert/re-cert  Localized edema - Plan: PT plan of care cert/re-cert  Rationale for Evaluation and Treatment: Rehabilitation  ONSET DATE: October 02, 2022  SUBJECTIVE:   SUBJECTIVE STATEMENT: Nyaire reports continued good HEP  compliance.  PERTINENT HISTORY: CABG x 4, previous left femur fracture in 2020, HTN, HLD, OA, anxiety, left THA in 2020  PAIN:  NPRS scale: 0-5/10 this week, can be higher with exercises but pain is short-lived.  She is trying to cut back on pain pills before bed, only taking them before PT. Pain location: Right knee Pain description: Ache, tight, sore and throbbing Aggravating factors: Sleeping, prolonged postures and hurts all the time Relieving factors: Ice and exercise  PRECAUTIONS: None  WEIGHT BEARING RESTRICTIONS: No  FALLS:  Has patient fallen in last 6 months? No  LIVING ENVIRONMENT: Lives with: lives with their family and lives with their spouse Lives in: House/apartment Stairs:  Does OK with handrail Has following equipment at home: Single point cane, Environmental consultant - 2 wheeled, and Grab bars  OCCUPATION: Retired, return to going to concerts  PLOF: Independent  PATIENT GOALS: Return to normal household activities without pain or use of an assistive device.   OBJECTIVE:   DIAGNOSTIC FINDINGS: 10/17/2022 review of chart:  2 views of the left knee show tricompartment arthritis with bone-on-bone  wear of all 3 compartments.  There are large osteophytes in all 3  compartments and significant joint space narrowing.  2 views of the right knee show severe tricompartment arthritis.  There are  osteophytes in all 3 compartments, a mild effusion and severe joint space  narrowing.  PATIENT SURVEYS:  12/07/2022  FOTO 69 (Goal met)  11/21/2022  FOTO 65 (was 46, Goal 66)  10/17/2022 FOTO was not recorded on first visit or 2nd visit so deferred  COGNITION: 10/17/2022 Overall cognitive status: Within functional limits for tasks assessed     SENSATION: 10/17/2022 No complaints of peripheral pain or paresthesias  EDEMA:  3/20/2024Noted and not objectively assessed.  LOWER EXTREMITY ROM:  Active ROM in degrees Left/Right 10/17/2022 Right 10/25/2022 AROM Right 10/31/2022  Right 11/02/2022 Right 11/07/2022 Right 11/14/2022 Right 11/21/2022 Right 11/28/2022 Right 12/05/2022  Hip flexion           Hip extension           Hip abduction           Hip adduction           Hip internal rotation           Hip external rotation           Knee flexion 112/63 70 deg in supine heel slide 82 degrees with PT assist PROM 83 91 98 98 98 106 AAROM  Knee extension 0/-5 -18 AROM in seated LAQ   -4 AROM (lacking between 3 and 4 degrees) -4 -4 -3 -2 -2 -2 AROM  Ankle dorsiflexion           Ankle plantarflexion           Ankle inversion           Ankle eversion            (Blank rows = not tested)  LOWER EXTREMITY STRENGTH:  STRENGTH IN POUNDS Left/Right 10/17/2022 Left/Right 11/07/2022 Left/Right 11/21/2022  Hip flexion     Hip extension     Hip abduction     Hip adduction     Hip internal rotation     Hip external rotation     Knee flexion     Knee extension 36.9/18.3 38.6/27.6 38.3/23.4  Ankle dorsiflexion     Ankle plantarflexion     Ankle inversion     Ankle eversion      (Blank rows = not tested)   GAIT: 4/10/20204: No AD  10/25/2022: SPC use.  10/17/2022 Distance walked: Within the clinic Assistive device utilized: Walker - 2 wheeled Level of assistance: Complete Independence Comments: Adhara was assistive device free before surgery   TODAY'S TREATMENT:                                             DATE:  12/07/2022 Recumbent bike Seat 6 (extension) and Seat 4 (flexion) 3 minutes each  Functional Activities: Step-up and over 4, 6 and 8 inch step with no hands, slow eccentrics Step-down off 4 inch step 2 sets of 10 slow eccentrics Double Leg Press 50# 2 sets of 15 Single Leg Press 25# right leg only 1 set of 10  Neuromuscular re-education: Tandem balance 6X 20 seconds (needs help to get right foot placement)   12/04/2021 Tailgate knee flexion 1 minute AAROM knee flexion (left pushes right into flexion) 2 sets of 5X 30 seconds with PT  overpressure Quadriceps sets with right heel prop 3 sets of 10 for 5 seconds Supine knee flexion with belt and PT overpressure 10X 10 seconds Recumbent bike Seat 5 full revolutions 2 minutes and Seat 4 for 2 minutes Standing knee flexion stretch on 8 inch step 10X 5 seconds  Vaso right knee 10 minutes Medium 34*   11/30/2022 Tailgate knee flexion 1 minute AAROM knee flexion (left pushes right into flexion) 10X 30 seconds with PT overpressure Quadriceps sets with right heel prop 2 sets of 10 for 5 seconds Supine knee flexion with belt and PT overpressure 5X 30 seconds Recumbent bike Seat 5 full revolutions 3 minutes and Seat 4 for 3 minutes Standing knee flexion stretch on 8 inch step 10X 5 seconds  Functional Activities: Step-up and over 6 and 8 inch step, no hands 6 inch step and left hand with 8 inch step  Vaso right knee 10 minutes Medium 34*   PATIENT EDUCATION:  10/25/2022 Education details: HEP update Person educated: Patient  Education method: Programmer, multimedia, Demonstration, Verbal cues, and Handouts Education comprehension: verbalized understanding, returned demonstration, verbal cues required  HOME EXERCISE PROGRAM: Access Code: [email or web address] URL: [email or web address] Date: 10/25/2022 Prepared by: Chyrel Masson  Exercises - Seated Knee Flexion AAROM  - 5 x daily - 7 x weekly - 1 sets - 1 reps - 3 minutes hold - Supine Quadricep Sets  - 5 x daily - 7 x weekly - 2 sets - 10 reps - 5 second hold - Seated Long Arc Quad  - 3-5 x daily - 7 x weekly - 1-2 sets - 10 reps - 2 hold - Supine Knee Extension Mobilization with Weight  - 3-5 x daily - 7 x weekly - 1 sets - 1 reps - to tolerance up to 15 mins hold  ASSESSMENT:  CLINICAL IMPRESSION: Ziona has AAROM at 2-0-106 degrees.  Today's visit was focused more on balance, function (stairs) and quadriceps strengthening.  Flexion AROM remains #1 priority with her HEP and will remain a priority until at least 110 degrees  of AAROM/PROM flexion are met.  Flexion active range of motion, quadriceps strength, balance and more advanced functional activities continue to be the focus of Jazzlyn's rehabilitation with the #1 emphasis remaining flexion AROM.  I anticipate Torionna will be ready for independent PT by the end of May.  OBJECTIVE IMPAIRMENTS: Abnormal gait, decreased activity tolerance, decreased endurance, decreased knowledge of condition, difficulty walking, decreased ROM, decreased strength, increased edema, and pain.   ACTIVITY LIMITATIONS: bending, sitting, standing, squatting, sleeping, stairs, and locomotion level  PARTICIPATION LIMITATIONS: cleaning, driving, shopping, and community activity  PERSONAL FACTORS: CABG x 4, previous left femur fracture in 2020, HTN, HLD, OA, anxiety, left THA in 2020 are also affecting patient's functional outcome.   REHAB POTENTIAL: Good  CLINICAL DECISION MAKING: Stable/uncomplicated  EVALUATION COMPLEXITY: Low   GOALS: Goals reviewed with patient? Yes  SHORT TERM GOALS: Target date: 11/14/2022 [name] will improve her right knee AROM to 2-0-90 degrees  Goal status: Met 11/21/2022  2.  [name] will be independent with her day 1 home exercise program  Goal status: Met 10/31/2022   LONG TERM GOALS: Target date: 12/28/2022  [name] will improve her right knee AROM to 0-110 degrees Baseline: 5-0-63 degrees Goal status: On Going 12/05/2022  2.  Improve right quadriceps strength to at least 40 pounds Baseline: 18 pounds Goal status: On Going 11/21/2022  3.  Zulmy will be independent and comfortable without the use of an assistive device Baseline: Using a wheeled walker at evaluation Goal status: Met 11/21/2022  4.  Improve FOTO to recommended value by discharge Baseline: Will be completed visit 2 Goal status: Met 12/07/2022  5.  [name] will be independent and compliant with a long-term home exercise program at discharge Baseline: Started 10/17/2022 Goal status: On Going  12/07/2022   PLAN:  PT FREQUENCY: 2x/week  PT DURATION: 3 weeks  PLANNED INTERVENTIONS: Therapeutic exercises, Therapeutic activity, Neuromuscular re-education, Balance training, Gait training, Patient/Family education, Self Care, Joint mobilization, Stair training, Cryotherapy, Vasopneumatic device, and Manual therapy  PLAN FOR NEXT SESSION: Flexion AROM, edema control and quadriceps strengthening.  Balance, stairs and functional activities if flexion is at least 108 degrees 12/12/2022.  Longer duration hold on flexion stretches.  Cherlyn Cushing PT, MPT 12/07/22  2:39 PM

## 2022-12-12 ENCOUNTER — Encounter: Payer: Self-pay | Admitting: Rehabilitative and Restorative Service Providers"

## 2022-12-12 ENCOUNTER — Ambulatory Visit (INDEPENDENT_AMBULATORY_CARE_PROVIDER_SITE_OTHER): Payer: HMO | Admitting: Rehabilitative and Restorative Service Providers"

## 2022-12-12 DIAGNOSIS — M25561 Pain in right knee: Secondary | ICD-10-CM | POA: Diagnosis not present

## 2022-12-12 DIAGNOSIS — R600 Localized edema: Secondary | ICD-10-CM

## 2022-12-12 DIAGNOSIS — M6281 Muscle weakness (generalized): Secondary | ICD-10-CM

## 2022-12-12 DIAGNOSIS — R262 Difficulty in walking, not elsewhere classified: Secondary | ICD-10-CM | POA: Diagnosis not present

## 2022-12-12 DIAGNOSIS — M25661 Stiffness of right knee, not elsewhere classified: Secondary | ICD-10-CM

## 2022-12-12 NOTE — Therapy (Signed)
OUTPATIENT PHYSICAL THERAPY TREATMENT NOTE  Patient Name: Debbie Cardenas MRN: [id number] DOB:[birth year], 77 y.o., female Today's Date: 12/12/2022   END OF SESSION:  PT End of Session - 12/12/22 1258     Visit Number 15    Date for PT Re-Evaluation 12/28/22    Authorization Type [employer] $10 copay    Authorization - Visit Number 15    Progress Note Due on Visit 20    PT Start Time 1257    PT Stop Time 1348    PT Time Calculation (min) 51 min    Equipment Utilized During Treatment --    Activity Tolerance Patient tolerated treatment well;No increased pain;Patient limited by pain    Behavior During Therapy [facility] for tasks assessed/performed               Past Medical History:  Diagnosis Date   Anxiety    Arthritis    knees   Closed fracture of neck of left femur (HCC) 08/15/2018   Coronary artery disease    Hyperlipidemia    Hypertension    Pneumonia    Past Surgical History:  Procedure Laterality Date   APPENDECTOMY     CARDIAC CATHETERIZATION     CATARACT EXTRACTION, BILATERAL     CHOLECYSTECTOMY     CORONARY ARTERY BYPASS GRAFT N/A 08/15/2020   Procedure: CORONARY ARTERY BYPASS GRAFTING (CABG)X4. USING BILATERAL MAMMARY ARTERIES AND RIGHT GREATER SAPHENOUS VEIN HARVESTED ENDOSCOPICALLY.;  Surgeon: Alleen Borne, MD;  Location: [facility] OR;  Service: Open Heart Surgery;  Laterality: N/A;   EYE SURGERY     bilateral cataract removal   KNEE SURGERY     LEFT HEART CATH AND CORONARY ANGIOGRAPHY N/A 07/27/2020   Procedure: LEFT HEART CATH AND CORONARY ANGIOGRAPHY;  Surgeon: [place], Peter M, MD;  Location: [facility] [place] INVASIVE CV LAB;  Service: Cardiovascular;  Laterality: N/A;   TEE WITHOUT CARDIOVERSION N/A 08/15/2020   Procedure: TRANSESOPHAGEAL ECHOCARDIOGRAM (TEE);  Surgeon: Alleen Borne, MD;  Location: [facility] OR;  Service: Open Heart Surgery;  Laterality: N/A;   TOTAL HIP ARTHROPLASTY Left 08/15/2018   Procedure: TOTAL HIP ARTHROPLASTY ANTERIOR APPROACH;  Surgeon:  Kathryne Hitch, MD;  Location: [facility] ORS;  Service: Orthopedics;  Laterality: Left;   TOTAL KNEE ARTHROPLASTY Right 10/02/2022   Procedure: RIGHT TOTAL KNEE ARTHROPLASTY;  Surgeon: Kathryne Hitch, MD;  Location: [facility] OR;  Service: Orthopedics;  Laterality: Right;   Patient Active Problem List   Diagnosis Date Noted   Delirium 10/10/2022   Atrial fibrillation with RVR (HCC) 10/09/2022   Chest pain 10/09/2022   Elevated troponin 10/09/2022   Pulmonary edema 10/09/2022   Hypokalemia 10/09/2022   Normocytic anemia 10/09/2022   Memory loss 10/09/2022   Status post total right knee replacement 10/02/2022   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   Coronary artery disease 08/15/2020   Coronary 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) 07/27/2020   HTN (hypertension) 07/27/2020   Hyperlipidemia 07/27/2020   Unilateral primary osteoarthritis, left knee 03/26/2019   Status post total replacement of left hip 08/28/2018   Hip fracture (HCC) 08/15/2018   Closed fracture of neck of left femur (HCC) 08/15/2018    PCP: Theora Gianotti, PA  REFERRING PROVIDER: Kathryne Hitch, MD  REFERRING DIAG:  Diagnosis  [phone] (ICD-10-CM) - Status post total right knee replacement    THERAPY DIAG:  Difficulty in walking, not elsewhere classified  Stiffness of right knee, not elsewhere classified  Muscle weakness (  generalized)  Right knee pain, unspecified chronicity  Localized edema  Rationale for Evaluation and Treatment: Rehabilitation  ONSET DATE: October 02, 2022  SUBJECTIVE:   SUBJECTIVE STATEMENT: Debbie Cardenas reports "C" HEP compliance over the past 5 days.  She reports she is doing well with WB and walking without any AD.  PERTINENT HISTORY: CABG x 4, previous left femur fracture in 2020, HTN, HLD, OA, anxiety, left THA in 2020  PAIN:  NPRS scale: 0-5/10 this week, can be higher with exercises but pain is short-lived.  She is trying to  cut back on pain pills before bed, only taking them before PT. Pain location: Right knee Pain description: Ache, tight, sore and throbbing Aggravating factors: Sleeping, prolonged postures and hurts all the time Relieving factors: Ice and exercise  PRECAUTIONS: None  WEIGHT BEARING RESTRICTIONS: No  FALLS:  Has patient fallen in last 6 months? No  LIVING ENVIRONMENT: Lives with: lives with their family and lives with their spouse Lives in: House/apartment Stairs:  Does OK with handrail Has following equipment at home: Single point cane, Environmental consultant - 2 wheeled, and Grab bars  OCCUPATION: Retired, return to going to concerts  PLOF: Independent  PATIENT GOALS: Return to normal household activities without pain or use of an assistive device.   OBJECTIVE:   DIAGNOSTIC FINDINGS: 10/17/2022 review of chart:  2 views of the left knee show tricompartment arthritis with bone-on-bone  wear of all 3 compartments.  There are large osteophytes in all 3  compartments and significant joint space narrowing.  2 views of the right knee show severe tricompartment arthritis.  There are  osteophytes in all 3 compartments, a mild effusion and severe joint space  narrowing.  PATIENT SURVEYS:  12/07/2022  FOTO 69 (Goal met)  11/21/2022  FOTO 65 (was 46, Goal 66)  10/17/2022 FOTO was not recorded on first visit or 2nd visit so deferred  COGNITION: 10/17/2022 Overall cognitive status: Within functional limits for tasks assessed     SENSATION: 10/17/2022 No complaints of peripheral pain or paresthesias  EDEMA:  3/20/2024Noted and not objectively assessed.  LOWER EXTREMITY ROM:  Active ROM in degrees Left/Right 10/17/2022 Right 10/25/2022 AROM Right 10/31/2022 Right 11/02/2022 Right 11/07/2022 Right 11/14/2022 Right 11/21/2022 Right 11/28/2022 Right 12/05/2022  Hip flexion           Hip extension           Hip abduction           Hip adduction           Hip internal rotation           Hip external  rotation           Knee flexion 112/63 70 deg in supine heel slide 82 degrees with PT assist PROM 83 91 98 98 98 106 AAROM  Knee extension 0/-5 -18 AROM in seated LAQ   -4 AROM (lacking between 3 and 4 degrees) -4 -4 -3 -2 -2 -2 AROM  Ankle dorsiflexion           Ankle plantarflexion           Ankle inversion           Ankle eversion            (Blank rows = not tested)  LOWER EXTREMITY STRENGTH:  STRENGTH IN POUNDS Left/Right 10/17/2022 Left/Right 11/07/2022 Left/Right 11/21/2022  Hip flexion     Hip extension     Hip abduction     Hip adduction  Hip internal rotation     Hip external rotation     Knee flexion     Knee extension 36.9/18.3 38.6/27.6 38.3/23.4  Ankle dorsiflexion     Ankle plantarflexion     Ankle inversion     Ankle eversion      (Blank rows = not tested)   GAIT: 4/10/20204: No AD  10/25/2022: SPC use.  10/17/2022 Distance walked: Within the clinic Assistive device utilized: Walker - 2 wheeled Level of assistance: Complete Independence Comments: Debbie Cardenas was assistive device free before surgery   TODAY'S TREATMENT:                                             DATE:  12/12/2022 Recumbent bike Seat 6 (extension) and Seat 4 (flexion) 3 minutes each Tailgate knee flexion 1 minute AAROM knee flexion (left pushes right into flexion) 1 set of 5X 30 seconds with PT overpressure Quadriceps sets with right heel prop 2 sets of 10 for 5 seconds Supine knee flexion with belt and PT overpressure 5X 30 seconds  Functional Activities: Double Leg Press 50# 2 sets of 15 Single Leg Press 25# right leg only 1 set of 10  Vaso right knee 10 minutes Medium 34*   12/07/2022 Recumbent bike Seat 6 (extension) and Seat 4 (flexion) 3 minutes each  Functional Activities: Step-up and over 4, 6 and 8 inch step with no hands, slow eccentrics Step-down off 4 inch step 2 sets of 10 slow eccentrics Double Leg Press 50# 2 sets of 15 Single Leg Press 25# right leg only 1 set of  10  Neuromuscular re-education: Tandem balance 6X 20 seconds (needs help to get right foot placement)  Vaso right knee 10 minutes Medium 34*   12/04/2021 Tailgate knee flexion 1 minute AAROM knee flexion (left pushes right into flexion) 2 sets of 5X 30 seconds with PT overpressure Quadriceps sets with right heel prop 3 sets of 10 for 5 seconds Supine knee flexion with belt and PT overpressure 10X 10 seconds Recumbent bike Seat 5 full revolutions 2 minutes and Seat 4 for 2 minutes Standing knee flexion stretch on 8 inch step 10X 5 seconds  Vaso right knee 10 minutes Medium 34*   PATIENT EDUCATION:  10/25/2022 Education details: HEP update Person educated: Patient  Education method: Programmer, multimedia, Demonstration, Verbal cues, and Handouts Education comprehension: verbalized understanding, returned demonstration, verbal cues required  HOME EXERCISE PROGRAM: Access Code: [email or web address] URL: [email or web address] Date: 10/25/2022 Prepared by: Chyrel Masson  Exercises - Seated Knee Flexion AAROM  - 5 x daily - 7 x weekly - 1 sets - 1 reps - 3 minutes hold - Supine Quadricep Sets  - 5 x daily - 7 x weekly - 2 sets - 10 reps - 5 second hold - Seated Long Arc Quad  - 3-5 x daily - 7 x weekly - 1-2 sets - 10 reps - 2 hold - Supine Knee Extension Mobilization with Weight  - 3-5 x daily - 7 x weekly - 1 sets - 1 reps - to tolerance up to 15 mins hold  ASSESSMENT:  CLINICAL IMPRESSION: AAROM remains 2-0-106 degrees.  We worked more on flexion AROM today secondary to "C" HEP compliance since Debbie Cardenas's last PT visit 12/07/2022.  Flexion ROM remains #1 priority with her HEP and will remain a priority until at least 110 degrees of AAROM/PROM flexion are met.  I still anticipate Debbie Cardenas will be ready for independent PT by the end of May.  OBJECTIVE IMPAIRMENTS: Abnormal gait, decreased activity tolerance, decreased endurance, decreased knowledge of condition, difficulty walking, decreased  ROM, decreased strength, increased edema, and pain.   ACTIVITY LIMITATIONS: bending, sitting, standing, squatting, sleeping, stairs, and locomotion level  PARTICIPATION LIMITATIONS: cleaning, driving, shopping, and community activity  PERSONAL FACTORS: CABG x 4, previous left femur fracture in 2020, HTN, HLD, OA, anxiety, left THA in 2020 are also affecting patient's functional outcome.   REHAB POTENTIAL: Good  CLINICAL DECISION MAKING: Stable/uncomplicated  EVALUATION COMPLEXITY: Low   GOALS: Goals reviewed with patient? Yes  SHORT TERM GOALS: Target date: 11/14/2022 Debbie Cardenas will improve her right knee AROM to 2-0-90 degrees  Goal status: Met 11/21/2022  2.  Debbie Cardenas will be independent with her day 1 home exercise program  Goal status: Met 10/31/2022   LONG TERM GOALS: Target date: 12/28/2022  Debbie Cardenas will improve her right knee AROM to 0-110 degrees Baseline: 5-0-63 degrees Goal status: On Going 12/12/2022  2.  Improve right quadriceps strength to at least 40 pounds Baseline: 18 pounds Goal status: On Going 11/21/2022  3.  Debbie Cardenas will be independent and comfortable without the use of an assistive device Baseline: Using a wheeled walker at evaluation Goal status: Met 11/21/2022  4.  Improve FOTO to recommended value by discharge Baseline: Will be completed visit 2 Goal status: Met 12/07/2022  5.  Debbie Cardenas will be independent and compliant with a long-term home exercise program at discharge Baseline: Started 10/17/2022 Goal status: On Going 12/12/2022   PLAN:  PT FREQUENCY: 2x/week  PT DURATION: 2-3 weeks  PLANNED INTERVENTIONS: Therapeutic exercises, Therapeutic activity, Neuromuscular re-education, Balance training, Gait training, Patient/Family education, Self Care, Joint mobilization, Stair training, Cryotherapy, Vasopneumatic device, and Manual therapy  PLAN FOR NEXT SESSION: Flexion AROM, edema control and quadriceps strengthening.  Balance, stairs and functional activities if  flexion is at least 108 degrees 12/14/2022.  Longer duration hold on flexion stretches.  Cherlyn Cushing PT, MPT 12/12/22  1:42 PM

## 2022-12-14 ENCOUNTER — Ambulatory Visit (INDEPENDENT_AMBULATORY_CARE_PROVIDER_SITE_OTHER): Payer: HMO | Admitting: Rehabilitative and Restorative Service Providers"

## 2022-12-14 ENCOUNTER — Encounter: Payer: Self-pay | Admitting: Rehabilitative and Restorative Service Providers"

## 2022-12-14 DIAGNOSIS — M25561 Pain in right knee: Secondary | ICD-10-CM | POA: Diagnosis not present

## 2022-12-14 DIAGNOSIS — R262 Difficulty in walking, not elsewhere classified: Secondary | ICD-10-CM

## 2022-12-14 DIAGNOSIS — M6281 Muscle weakness (generalized): Secondary | ICD-10-CM

## 2022-12-14 DIAGNOSIS — M25661 Stiffness of right knee, not elsewhere classified: Secondary | ICD-10-CM | POA: Diagnosis not present

## 2022-12-14 DIAGNOSIS — R600 Localized edema: Secondary | ICD-10-CM

## 2022-12-14 NOTE — Therapy (Signed)
OUTPATIENT PHYSICAL THERAPY TREATMENT/PROGRESS NOTE  Patient Name: Debbie Cardenas MRN: [id number] DOB:[birth year], 77 y.o., female Today's Date: 12/14/2022  Progress Note Reporting Period 10/17/2022 to 12/14/2022  See note below for Objective Data and Assessment of Progress/Goals.      END OF SESSION:  PT End of Session - 12/14/22 1256     Visit Number 16    Date for PT Re-Evaluation 12/28/22    Authorization Type [employer] $10 copay    Authorization - Visit Number 16    Progress Note Due on Visit 20    PT Start Time 1255    PT Stop Time 1347    PT Time Calculation (min) 52 min    Activity Tolerance Patient tolerated treatment well;No increased pain;Patient limited by pain    Behavior During Therapy [facility] for tasks assessed/performed                Past Medical History:  Diagnosis Date   Anxiety    Arthritis    knees   Closed fracture of neck of left femur (HCC) 08/15/2018   Coronary artery disease    Hyperlipidemia    Hypertension    Pneumonia    Past Surgical History:  Procedure Laterality Date   APPENDECTOMY     CARDIAC CATHETERIZATION     CATARACT EXTRACTION, BILATERAL     CHOLECYSTECTOMY     CORONARY ARTERY BYPASS GRAFT N/A 08/15/2020   Procedure: CORONARY ARTERY BYPASS GRAFTING (CABG)X4. USING BILATERAL MAMMARY ARTERIES AND RIGHT GREATER SAPHENOUS VEIN HARVESTED ENDOSCOPICALLY.;  Surgeon: Alleen Borne, MD;  Location: [facility] OR;  Service: Open Heart Surgery;  Laterality: N/A;   EYE SURGERY     bilateral cataract removal   KNEE SURGERY     LEFT HEART CATH AND CORONARY ANGIOGRAPHY N/A 07/27/2020   Procedure: LEFT HEART CATH AND CORONARY ANGIOGRAPHY;  Surgeon: [place], Peter M, MD;  Location: [facility] INVASIVE CV LAB;  Service: Cardiovascular;  Laterality: N/A;   TEE WITHOUT CARDIOVERSION N/A 08/15/2020   Procedure: TRANSESOPHAGEAL ECHOCARDIOGRAM (TEE);  Surgeon: Alleen Borne, MD;  Location: [facility] OR;  Service: Open Heart Surgery;  Laterality: N/A;   TOTAL HIP  ARTHROPLASTY Left 08/15/2018   Procedure: TOTAL HIP ARTHROPLASTY ANTERIOR APPROACH;  Surgeon: Kathryne Hitch, MD;  Location: [facility] ORS;  Service: Orthopedics;  Laterality: Left;   TOTAL KNEE ARTHROPLASTY Right 10/02/2022   Procedure: RIGHT TOTAL KNEE ARTHROPLASTY;  Surgeon: Kathryne Hitch, MD;  Location: [facility] OR;  Service: Orthopedics;  Laterality: Right;   Patient Active Problem List   Diagnosis Date Noted   Delirium 10/10/2022   Atrial fibrillation with RVR (HCC) 10/09/2022   Chest pain 10/09/2022   Elevated troponin 10/09/2022   Pulmonary edema 10/09/2022   Hypokalemia 10/09/2022   Normocytic anemia 10/09/2022   Memory loss 10/09/2022   Status post total right knee replacement 10/02/2022   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   Coronary artery disease 08/15/2020   Coronary 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) 07/27/2020   HTN (hypertension) 07/27/2020   Hyperlipidemia 07/27/2020   Unilateral primary osteoarthritis, left knee 03/26/2019   Status post total replacement of left hip 08/28/2018   Hip fracture (HCC) 08/15/2018   Closed fracture of neck of left femur (HCC) 08/15/2018    PCP: Theora Gianotti, PA  REFERRING PROVIDER: Kathryne Hitch, MD  REFERRING DIAG:  Diagnosis  [phone] (ICD-10-CM) - Status post total right knee replacement    THERAPY DIAG:  Difficulty in  walking, not elsewhere classified  Stiffness of right knee, not elsewhere classified  Muscle weakness (generalized)  Right knee pain, unspecified chronicity  Localized edema  Rationale for Evaluation and Treatment: Rehabilitation  ONSET DATE: October 02, 2022  SUBJECTIVE:   SUBJECTIVE STATEMENT: Danaisa is nearly independent with her home exercise program which is addressing her remaining impairments.  She reports she is doing well with WB and walking without any AD.  PERTINENT HISTORY: CABG x 4, previous left femur fracture in 2020, HTN,  HLD, OA, anxiety, left THA in 2020  PAIN:  NPRS scale: 0-5/10 this week She is trying to cut back on pain pills before bed, only taking them before PT. Pain location: Right knee Pain description: Ache, tight, sore and throbbing Aggravating factors: Sleeping, prolonged postures and hurts all the time Relieving factors: Ice and exercise  PRECAUTIONS: None  WEIGHT BEARING RESTRICTIONS: No  FALLS:  Has patient fallen in last 6 months? No  LIVING ENVIRONMENT: Lives with: lives with their family and lives with their spouse Lives in: House/apartment Stairs:  Does OK with handrail Has following equipment at home: Single point cane, Environmental consultant - 2 wheeled, and Grab bars  OCCUPATION: Retired, return to going to concerts  PLOF: Independent  PATIENT GOALS: Return to normal household activities without pain or use of an assistive device.   OBJECTIVE:   DIAGNOSTIC FINDINGS: 10/17/2022 review of chart:  2 views of the left knee show tricompartment arthritis with bone-on-bone  wear of all 3 compartments.  There are large osteophytes in all 3  compartments and significant joint space narrowing.  2 views of the right knee show severe tricompartment arthritis.  There are  osteophytes in all 3 compartments, a mild effusion and severe joint space  narrowing.  PATIENT SURVEYS:  12/07/2022  FOTO 69 (Goal met)  11/21/2022  FOTO 65 (was 46, Goal 66)  10/17/2022 FOTO was not recorded on first visit or 2nd visit so deferred  COGNITION: 10/17/2022 Overall cognitive status: Within functional limits for tasks assessed     SENSATION: 10/17/2022 No complaints of peripheral pain or paresthesias  EDEMA:  3/20/2024Noted and not objectively assessed.  LOWER EXTREMITY ROM:  Active ROM in degrees Left/Right 10/17/2022 Right 10/25/2022 AROM Right 10/31/2022 Right 11/02/2022 Right 11/07/2022 Right 11/14/2022 Right 11/21/2022 Right 11/28/2022 Right 12/05/2022 Right 12/14/2022  Hip flexion            Hip  extension            Hip abduction            Hip adduction            Hip internal rotation            Hip external rotation            Knee flexion 112/63 70 deg in supine heel slide 82 degrees with PT assist PROM 83 91 98 98 98 106 AAROM 104 AAROM  Knee extension 0/-5 -18 AROM in seated LAQ   -4 AROM (lacking between 3 and 4 degrees) -4 -4 -3 -2 -2 -2 AROM -2 AROM  Ankle dorsiflexion            Ankle plantarflexion            Ankle inversion            Ankle eversion             (Blank rows = not tested)  LOWER EXTREMITY STRENGTH:  STRENGTH IN POUNDS Left/Right 10/17/2022  Left/Right 11/07/2022 Left/Right 11/21/2022 Left/Right 12/14/2022  Hip flexion      Hip extension      Hip abduction      Hip adduction      Hip internal rotation      Hip external rotation      Knee flexion      Knee extension 36.9/18.3 38.6/27.6 38.3/23.4 39.7/34.6  Ankle dorsiflexion      Ankle plantarflexion      Ankle inversion      Ankle eversion       (Blank rows = not tested)   GAIT: 12/14/2022: No AD  4/10/20204: No AD  10/25/2022: SPC use.  10/17/2022 Distance walked: Within the clinic Assistive device utilized: Walker - 2 wheeled Level of assistance: Complete Independence Comments: Byrle was assistive device free before surgery   TODAY'S TREATMENT:                                             DATE:  12/14/2022 AAROM knee flexion (left pushes right into flexion) 10X 30 seconds with PT overpressure Quadriceps sets with right heel prop 2 sets of 10 for 5 seconds Supine knee flexion with belt and PT overpressure 10X 30 seconds  Functional Activities: Double Leg Press 50# 20 reps Single Leg Press 25# right leg only 1 set of 10  Vaso right knee 10 minutes Medium 34*   12/12/2022 Recumbent bike Seat 6 (extension) and Seat 4 (flexion) 3 minutes each Tailgate knee flexion 1 minute AAROM knee flexion (left pushes right into flexion) 1 set of 5X 30 seconds with PT overpressure Quadriceps  sets with right heel prop 2 sets of 10 for 5 seconds Supine knee flexion with belt and PT overpressure 5X 30 seconds  Functional Activities: Double Leg Press 50# 2 sets of 15 Single Leg Press 25# right leg only 1 set of 10  Vaso right knee 10 minutes Medium 34*   12/07/2022 Recumbent bike Seat 6 (extension) and Seat 4 (flexion) 3 minutes each  Functional Activities: Step-up and over 4, 6 and 8 inch step with no hands, slow eccentrics Step-down off 4 inch step 2 sets of 10 slow eccentrics Double Leg Press 50# 2 sets of 15 Single Leg Press 25# right leg only 1 set of 10  Neuromuscular re-education: Tandem balance 6X 20 seconds (needs help to get right foot placement)  Vaso right knee 10 minutes Medium 34*   PATIENT EDUCATION:  10/25/2022 Education details: HEP update Person educated: Patient  Education method: Programmer, multimedia, Demonstration, Verbal cues, and Handouts Education comprehension: verbalized understanding, returned demonstration, verbal cues required  HOME EXERCISE PROGRAM: Access Code: [email or web address] URL: [email or web address] Date: 10/25/2022 Prepared by: Chyrel Masson  Exercises - Seated Knee Flexion AAROM  - 5 x daily - 7 x weekly - 1 sets - 1 reps - 3 minutes hold - Supine Quadricep Sets  - 5 x daily - 7 x weekly - 2 sets - 10 reps - 5 second hold - Seated Long Arc Quad  - 3-5 x daily - 7 x weekly - 1-2 sets - 10 reps - 2 hold - Supine Knee Extension Mobilization with Weight  - 3-5 x daily - 7 x weekly - 1 sets - 1 reps - to tolerance up to 15 mins hold  ASSESSMENT:  CLINICAL IMPRESSION: Tzipa is only lacking 2 degrees of knee extension active range of motion.  She can assist herself to 104 degrees of knee flexion while active knee flexion is closer to 95 degrees.  Amandalynn's home exercise program is addressing all remaining impairments and compliance shifts from very good to inconsistent.  Flexion ROM remains the #1 priority with her HEP and will remain  a priority until at least 110 degrees of AAROM/PROM flexion are met.  With consistent home exercise program compliance, I anticipate Willie will be ready for independent PT by the end of May.  OBJECTIVE IMPAIRMENTS: Abnormal gait, decreased activity tolerance, decreased endurance, decreased knowledge of condition, difficulty walking, decreased ROM, decreased strength, increased edema, and pain.   ACTIVITY LIMITATIONS: bending, sitting, standing, squatting, sleeping, stairs, and locomotion level  PARTICIPATION LIMITATIONS: cleaning, driving, shopping, and community activity  PERSONAL FACTORS: CABG x 4, previous left femur fracture in 2020, HTN, HLD, OA, anxiety, left THA in 2020 are also affecting patient's functional outcome.   REHAB POTENTIAL: Good  CLINICAL DECISION MAKING: Stable/uncomplicated  EVALUATION COMPLEXITY: Low   GOALS: Goals reviewed with patient? Yes  SHORT TERM GOALS: Target date: 11/14/2022 [name] will improve her right knee AROM to 2-0-90 degrees  Goal status: Met 11/21/2022  2.  [name] will be independent with her day 1 home exercise program  Goal status: Met 10/31/2022   LONG TERM GOALS: Target date: 12/28/2022  [name] will improve her right knee AROM to 0-110 degrees Baseline: 5-0-63 degrees Goal status: On Going 12/14/2022  2.  Improve right quadriceps strength to at least 40 pounds Baseline: 18 pounds Goal status: On Going 11/21/2022  3.  Misao will be independent and comfortable without the use of an assistive device Baseline: Using a wheeled walker at evaluation Goal status: Met 11/21/2022  4.  Improve FOTO to recommended value by discharge Baseline: Will be completed visit 2 Goal status: Met 12/07/2022  5.  [name] will be independent and compliant with a long-term home exercise program at discharge Baseline: Started 10/17/2022 Goal status: On Going 12/14/2022   PLAN:  PT FREQUENCY: 1-2x/week  PT DURATION: 2 weeks  PLANNED INTERVENTIONS: Therapeutic  exercises, Therapeutic activity, Neuromuscular re-education, Balance training, Gait training, Patient/Family education, Self Care, Joint mobilization, Stair training, Cryotherapy, Vasopneumatic device, and Manual therapy  PLAN FOR NEXT SESSION: Flexion AROM, edema control and quadriceps strengthening.  Longer duration hold on flexion stretches.  I anticipate she will transfer into independent rehabilitation on the day that she sees Dr. Magnus Ivan.  If he wishes that we continue supervised PT, she is welcome to return.  Otherwise, she has an independent home exercise program to follow that is addressing her remaining impairments.  I recommended she follow-up 1 more time with [place] before she sees Dr. Magnus Ivan to double check that she is comfortable with this program before discharge.  Likely DC next visit.  Cherlyn Cushing PT, MPT 12/14/22  4:26 PM

## 2022-12-26 ENCOUNTER — Encounter: Payer: Self-pay | Admitting: Orthopaedic Surgery

## 2022-12-26 ENCOUNTER — Encounter: Payer: Self-pay | Admitting: Physical Therapy

## 2022-12-26 ENCOUNTER — Ambulatory Visit (INDEPENDENT_AMBULATORY_CARE_PROVIDER_SITE_OTHER): Payer: HMO | Admitting: Orthopaedic Surgery

## 2022-12-26 ENCOUNTER — Ambulatory Visit (INDEPENDENT_AMBULATORY_CARE_PROVIDER_SITE_OTHER): Payer: HMO | Admitting: Physical Therapy

## 2022-12-26 DIAGNOSIS — M25661 Stiffness of right knee, not elsewhere classified: Secondary | ICD-10-CM | POA: Diagnosis not present

## 2022-12-26 DIAGNOSIS — Z96651 Presence of right artificial knee joint: Secondary | ICD-10-CM

## 2022-12-26 DIAGNOSIS — R262 Difficulty in walking, not elsewhere classified: Secondary | ICD-10-CM

## 2022-12-26 DIAGNOSIS — R600 Localized edema: Secondary | ICD-10-CM

## 2022-12-26 DIAGNOSIS — M6281 Muscle weakness (generalized): Secondary | ICD-10-CM

## 2022-12-26 DIAGNOSIS — M25561 Pain in right knee: Secondary | ICD-10-CM | POA: Diagnosis not present

## 2022-12-26 NOTE — Progress Notes (Signed)
The patient is getting close to 3 months status post a right total knee arthroplasty.  She has been pushing herself hard to physical therapy and I have reviewed the notes even from today's physical therapy session.  She is making progress with her range of motion.  We have talked about the possibility of a manipulation under anesthesia but she is definitely not ready for any type of surgical intervention given her significant memory loss around the time of surgery.  She is still not over that she states and defers any type of surgical intervention at all.  On my exam today the knee is swollen to be expected but it is making progress in terms of her motion.  She is not walking with an assistive device.  She says she has not slept in a few days and she was sleeping better.  She has been told that she may have depression that she is dealing with and her primary care physician is sending her to a neurologist for assessment of I believe her cognitive state.  She feels like she is much more clear and gets better in today the further she is out from surgery.  From my standpoint, the next time I really need to see is not for 2 months unless there are issues since we are not recommend anything else other than continued therapy and mobility is much as possible even on a daily basis on her own in terms of bending that knee.  In 2 months I would like a standing AP and lateral of her right operative knee.  All question concerns were answered and addressed.

## 2022-12-26 NOTE — Therapy (Signed)
OUTPATIENT PHYSICAL THERAPY TREATMENT/DISCHARGE   Patient Name: Debbie Cardenas MRN: [id number] DOB:1945-09-07, 77 y.o., female Today's Date: 12/26/2022  PHYSICAL THERAPY DISCHARGE SUMMARY  Visits from Start of Care: 17  Current functional level related to goals / functional outcomes: See below    Remaining deficits: See below    Education / Equipment: See below    Patient agrees to discharge. Patient goals were partially met. Patient is being discharged due to maximized rehab potential.         END OF SESSION:  PT End of Session - 12/26/22 1309     Visit Number 17    Date for PT Re-Evaluation 12/28/22    Authorization Type [employer] $10 copay    Progress Note Due on Visit 20    PT Start Time 1302    PT Stop Time 1334   ended early due to MD appt at 1345   PT Time Calculation (min) 32 min    Activity Tolerance Patient tolerated treatment well    Behavior During Therapy [facility] for tasks assessed/performed                 Past Medical History:  Diagnosis Date   Anxiety    Arthritis    knees   Closed fracture of neck of left femur (HCC) 08/15/2018   Coronary artery disease    Hyperlipidemia    Hypertension    Pneumonia    Past Surgical History:  Procedure Laterality Date   APPENDECTOMY     CARDIAC CATHETERIZATION     CATARACT EXTRACTION, BILATERAL     CHOLECYSTECTOMY     CORONARY ARTERY BYPASS GRAFT N/A 08/15/2020   Procedure: CORONARY ARTERY BYPASS GRAFTING (CABG)X4. USING BILATERAL MAMMARY ARTERIES AND RIGHT GREATER SAPHENOUS VEIN HARVESTED ENDOSCOPICALLY.;  Surgeon: Alleen Borne, MD;  Location: [facility] OR;  Service: Open Heart Surgery;  Laterality: N/A;   EYE SURGERY     bilateral cataract removal   KNEE SURGERY     LEFT HEART CATH AND CORONARY ANGIOGRAPHY N/A 07/27/2020   Procedure: LEFT HEART CATH AND CORONARY ANGIOGRAPHY;  Surgeon: [place], Peter M, MD;  Location: [facility] INVASIVE CV LAB;  Service: Cardiovascular;  Laterality: N/A;   TEE WITHOUT  CARDIOVERSION N/A 08/15/2020   Procedure: TRANSESOPHAGEAL ECHOCARDIOGRAM (TEE);  Surgeon: Alleen Borne, MD;  Location: [facility] OR;  Service: Open Heart Surgery;  Laterality: N/A;   TOTAL HIP ARTHROPLASTY Left 08/15/2018   Procedure: TOTAL HIP ARTHROPLASTY ANTERIOR APPROACH;  Surgeon: Kathryne Hitch, MD;  Location: [facility] ORS;  Service: Orthopedics;  Laterality: Left;   TOTAL KNEE ARTHROPLASTY Right 10/02/2022   Procedure: RIGHT TOTAL KNEE ARTHROPLASTY;  Surgeon: Kathryne Hitch, MD;  Location: [facility] OR;  Service: Orthopedics;  Laterality: Right;   Patient Active Problem List   Diagnosis Date Noted   Delirium 10/10/2022   Atrial fibrillation with RVR (HCC) 10/09/2022   Chest pain 10/09/2022   Elevated troponin 10/09/2022   Pulmonary edema 10/09/2022   Hypokalemia 10/09/2022   Normocytic anemia 10/09/2022   Memory loss 10/09/2022   Status post total right knee replacement 10/02/2022   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   Coronary artery disease 08/15/2020   Coronary 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) 07/27/2020   HTN (hypertension) 07/27/2020   Hyperlipidemia 07/27/2020   Unilateral primary osteoarthritis, left knee 03/26/2019   Status post total replacement of left hip 08/28/2018   Hip fracture (HCC) 08/15/2018   Closed fracture of neck of  left femur (HCC) 08/15/2018    PCP: Theora Gianotti, PA  REFERRING PROVIDER: Kathryne Hitch, MD  REFERRING DIAG:  Diagnosis  [phone] (ICD-10-CM) - Status post total right knee replacement    THERAPY DIAG:  Difficulty in walking, not elsewhere classified  Muscle weakness (generalized)  Right knee pain, unspecified chronicity  Stiffness of right knee, not elsewhere classified  Localized edema  Rationale for Evaluation and Treatment: Rehabilitation  ONSET DATE: October 02, 2022  SUBJECTIVE:   SUBJECTIVE STATEMENT: My knee still hurts off and on, going to the beach today after  my appointments today, excited about that.   PERTINENT HISTORY: CABG x 4, previous left femur fracture in 2020, HTN, HLD, OA, anxiety, left THA in 2020  PAIN:  NPRS scale: 3-4/10 this week  Pain location: Right knee Pain description: Aching  Aggravating factors: Sleeping, prolonged postures and hurts all the time Relieving factors: Ice and exercise  PRECAUTIONS: None  WEIGHT BEARING RESTRICTIONS: No  FALLS:  Has patient fallen in last 6 months? No  LIVING ENVIRONMENT: Lives with: lives with their family and lives with their spouse Lives in: House/apartment Stairs:  Does OK with handrail Has following equipment at home: Single point cane, Environmental consultant - 2 wheeled, and Grab bars  OCCUPATION: Retired, return to going to concerts  PLOF: Independent  PATIENT GOALS: Return to normal household activities without pain or use of an assistive device.   OBJECTIVE:   DIAGNOSTIC FINDINGS: 10/17/2022 review of chart:  2 views of the left knee show tricompartment arthritis with bone-on-bone  wear of all 3 compartments.  There are large osteophytes in all 3  compartments and significant joint space narrowing.  2 views of the right knee show severe tricompartment arthritis.  There are  osteophytes in all 3 compartments, a mild effusion and severe joint space  narrowing.  PATIENT SURVEYS:  12/07/2022  FOTO 69 (Goal met)  11/21/2022  FOTO 65 (was 46, Goal 66)  10/17/2022 FOTO was not recorded on first visit or 2nd visit so deferred  COGNITION: 10/17/2022 Overall cognitive status: Within functional limits for tasks assessed     SENSATION: 10/17/2022 No complaints of peripheral pain or paresthesias  EDEMA:  3/20/2024Noted and not objectively assessed.  LOWER EXTREMITY ROM:  Active ROM in degrees Left/Right 10/17/2022 Right 10/25/2022 AROM Right 10/31/2022 Right 11/02/2022 Right 11/07/2022 Right 11/14/2022 Right 11/21/2022 Right 11/28/2022 Right 12/05/2022 Right 12/14/2022 Right 12/26/22  Hip  flexion             Hip extension             Hip abduction             Hip adduction             Hip internal rotation             Hip external rotation             Knee flexion 112/63 70 deg in supine heel slide 82 degrees with PT assist PROM 83 91 98 98 98 106 AAROM 104 AAROM 103* AAROM, 93* AROM  Knee extension 0/-5 -18 AROM in seated LAQ   -4 AROM (lacking between 3 and 4 degrees) -4 -4 -3 -2 -2 -2 AROM -2 AROM -2* AROM heel prop supine   Ankle dorsiflexion             Ankle plantarflexion             Ankle inversion  Ankle eversion              (Blank rows = not tested)  LOWER EXTREMITY STRENGTH:  STRENGTH IN POUNDS Left/Right 10/17/2022 Left/Right 11/07/2022 Left/Right 11/21/2022 Left/Right 12/14/2022 Left/Right 12/26/22  Hip flexion       Hip extension       Hip abduction       Hip adduction       Hip internal rotation       Hip external rotation       Knee flexion       Knee extension 36.9/18.3 38.6/27.6 38.3/23.4 39.7/34.6 L 4/5, R 5/5   Ankle dorsiflexion       Ankle plantarflexion       Ankle inversion       Ankle eversion        (Blank rows = not tested)   GAIT: 12/14/2022: No AD  4/10/20204: No AD  10/25/2022: SPC use.  10/17/2022 Distance walked: Within the clinic Assistive device utilized: Walker - 2 wheeled Level of assistance: Complete Independence Comments: Taleena was assistive device free before surgery   TODAY'S TREATMENT:                                             DATE:   12/26/22  TherEx  Precor bike x6 minutes seat 6 full rotations Forward step ups 4 inch box x15 R LE  Lateral step ups 4 inch box x15 R LE  Forward step downs 4 inch box x10 R LE   Objective measures/appropriate DC education, self care- answered all questions about HEP/self care and exercise after DC from PT, encouraged checking with insurance to see if they will pay for silver sneakers so that she has a solid gym based program to help keep her motivated as well,  DC today    12/14/2022 AAROM knee flexion (left pushes right into flexion) 10X 30 seconds with PT overpressure Quadriceps sets with right heel prop 2 sets of 10 for 5 seconds Supine knee flexion with belt and PT overpressure 10X 30 seconds  Functional Activities: Double Leg Press 50# 20 reps Single Leg Press 25# right leg only 1 set of 10  Vaso right knee 10 minutes Medium 34*   12/12/2022 Recumbent bike Seat 6 (extension) and Seat 4 (flexion) 3 minutes each Tailgate knee flexion 1 minute AAROM knee flexion (left pushes right into flexion) 1 set of 5X 30 seconds with PT overpressure Quadriceps sets with right heel prop 2 sets of 10 for 5 seconds Supine knee flexion with belt and PT overpressure 5X 30 seconds  Functional Activities: Double Leg Press 50# 2 sets of 15 Single Leg Press 25# right leg only 1 set of 10  Vaso right knee 10 minutes Medium 34*   12/07/2022 Recumbent bike Seat 6 (extension) and Seat 4 (flexion) 3 minutes each  Functional Activities: Step-up and over 4, 6 and 8 inch step with no hands, slow eccentrics Step-down off 4 inch step 2 sets of 10 slow eccentrics Double Leg Press 50# 2 sets of 15 Single Leg Press 25# right leg only 1 set of 10  Neuromuscular re-education: Tandem balance 6X 20 seconds (needs help to get right foot placement)  Vaso right knee 10 minutes Medium 34*   PATIENT EDUCATION:  10/25/2022 Education details: HEP update Person educated: Patient  Education method: Explanation, Demonstration, Verbal cues, and Handouts  Education comprehension: verbalized understanding, returned demonstration, verbal cues required  HOME EXERCISE PROGRAM: Access Code: [email or web address] URL: [email or web address] Date: 10/25/2022 Prepared by: Chyrel Masson  Exercises - Seated Knee Flexion AAROM  - 5 x daily - 7 x weekly - 1 sets - 1 reps - 3 minutes hold - Supine Quadricep Sets  - 5 x daily - 7 x weekly - 2 sets - 10 reps - 5 second hold -  Seated Long Arc Quad  - 3-5 x daily - 7 x weekly - 1-2 sets - 10 reps - 2 hold - Supine Knee Extension Mobilization with Weight  - 3-5 x daily - 7 x weekly - 1 sets - 1 reps - to tolerance up to 15 mins hold  ASSESSMENT:  CLINICAL IMPRESSION:   Livia arrives today doing OK, still having pain in her knee, I assured her this is normal given her procedure. Warmed up on the bike, then focused on getting DC measures and FOTO, as well as on answering all questions about HEP/exercise and activity after DC from PT. Has not been compliant with HEP, really encouraged this moving forward to help prevent backsliding with her knee after DC.  DC per POC this visit.   OBJECTIVE IMPAIRMENTS: Abnormal gait, decreased activity tolerance, decreased endurance, decreased knowledge of condition, difficulty walking, decreased ROM, decreased strength, increased edema, and pain.   ACTIVITY LIMITATIONS: bending, sitting, standing, squatting, sleeping, stairs, and locomotion level  PARTICIPATION LIMITATIONS: cleaning, driving, shopping, and community activity  PERSONAL FACTORS: CABG x 4, previous left femur fracture in 2020, HTN, HLD, OA, anxiety, left THA in 2020 are also affecting patient's functional outcome.   REHAB POTENTIAL: Good  CLINICAL DECISION MAKING: Stable/uncomplicated  EVALUATION COMPLEXITY: Low   GOALS: Goals reviewed with patient? Yes  SHORT TERM GOALS: Target date: 11/14/2022 [name] will improve her right knee AROM to 2-0-90 degrees  Goal status: Met 11/21/2022  2.  [name] will be independent with her day 1 home exercise program  Goal status: Met 10/31/2022   LONG TERM GOALS: Target date: 12/28/2022  [name] will improve her right knee AROM to 0-110 degrees Baseline: 5-0-63 Goal status: 12/26/22- NOT MET   2.  Improve right quadriceps strength to at least 40 pounds Baseline: 18 pounds Goal status:12/26/22 NOT MET   3.  Daijia will be independent and comfortable without the use of an assistive  device Baseline: Using a wheeled walker at evaluation Goal status: Met 11/21/2022  4.  Improve FOTO to recommended value by discharge Baseline: Will be completed visit 2 Goal status: Met 12/07/2022  5.  [name] will be independent and compliant with a long-term home exercise program at discharge Baseline: Started 10/17/2022 Goal status: NOT MET 12/26/22- has good HEP set for her but is not compliant    PLAN:  PT FREQUENCY: 1-2x/week  PT DURATION: 2 weeks  PLANNED INTERVENTIONS: Therapeutic exercises, Therapeutic activity, Neuromuscular re-education, Balance training, Gait training, Patient/Family education, Self Care, Joint mobilization, Stair training, Cryotherapy, Vasopneumatic device, and Manual therapy  PLAN FOR NEXT SESSION: DC today per established POC   Nedra Hai PT DPT PN2

## 2023-01-02 ENCOUNTER — Encounter: Payer: HMO | Admitting: Rehabilitative and Restorative Service Providers"

## 2023-01-04 ENCOUNTER — Encounter: Payer: HMO | Admitting: Rehabilitative and Restorative Service Providers"

## 2023-02-07 NOTE — Progress Notes (Signed)
Cardiology Clinic Note   Patient Name: Debbie Cardenas Date of Encounter: 02/14/2023  Primary Care Provider:  Porfirio Oar, PA Primary Cardiologist:  Peter Swaziland, MD  Patient Profile    Debbie Cardenas 77 year old female presents to the clinic today for follow-up evaluation of her coronary artery disease, atrial fibrillation, and hypertension.  Past Medical History    Past Medical History:  Diagnosis Date   Anxiety    Arthritis    knees   Closed fracture of neck of left femur (HCC) 08/15/2018   Coronary artery disease    Hyperlipidemia    Hypertension    Pneumonia    Past Surgical History:  Procedure Laterality Date   APPENDECTOMY     CARDIAC CATHETERIZATION     CATARACT EXTRACTION, BILATERAL     CHOLECYSTECTOMY     CORONARY ARTERY BYPASS GRAFT N/A 08/15/2020   Procedure: CORONARY ARTERY BYPASS GRAFTING (CABG)X4. USING BILATERAL MAMMARY ARTERIES AND RIGHT GREATER SAPHENOUS VEIN HARVESTED ENDOSCOPICALLY.;  Surgeon: Alleen Borne, MD;  Location: [facility] OR;  Service: Open Heart Surgery;  Laterality: N/A;   EYE SURGERY     bilateral cataract removal   KNEE SURGERY     LEFT HEART CATH AND CORONARY ANGIOGRAPHY N/A 07/27/2020   Procedure: LEFT HEART CATH AND CORONARY ANGIOGRAPHY;  Surgeon: Swaziland, Peter M, MD;  Location: [facility] INVASIVE CV LAB;  Service: Cardiovascular;  Laterality: N/A;   TEE WITHOUT CARDIOVERSION N/A 08/15/2020   Procedure: TRANSESOPHAGEAL ECHOCARDIOGRAM (TEE);  Surgeon: Alleen Borne, MD;  Location: [facility] OR;  Service: Open Heart Surgery;  Laterality: N/A;   TOTAL HIP ARTHROPLASTY Left 08/15/2018   Procedure: TOTAL HIP ARTHROPLASTY ANTERIOR APPROACH;  Surgeon: Kathryne Hitch, MD;  Location: [facility] ORS;  Service: Orthopedics;  Laterality: Left;   TOTAL KNEE ARTHROPLASTY Right 10/02/2022   Procedure: RIGHT TOTAL KNEE ARTHROPLASTY;  Surgeon: Kathryne Hitch, MD;  Location: [facility] OR;  Service: Orthopedics;  Laterality: Right;    Allergies  Allergies   Allergen Reactions   Tramadol Itching    Pt itches the day after taking but can tolerate it   Ephedrine     "My dentist told me to never let anyone give me this"   Ace Inhibitors Cough    History of Present Illness    Debbie ANDREATTA Ms. Harbeck has a PMH of hypertension, hyperlipidemia, degenerative arthritis of both knees, and a family history of coronary artery disease.  She is a former smoker.  She underwent coronary CTA which showed a calcium score over 1000 and stenosis that was flow-limiting in the RCA and LAD.  It was felt to be high risk and.  She had a cardiac event monitor which showed normal sinus rhythm.  She underwent cardiac catheterization 07/08/2020 which showed 70% mid-distal left main stenosis.  50% ostial-proximal LAD stenosis and 70% proximal mid LAD stenosis, moderate size diagonal branch was noted off the area of stenosis, first marginal was noted to have 50% stenosis, ostium of RCA had 90% stenosis, and LV EF was 55-60% with normal LVEDP.  Her husband had bypass surgery by Dr. Tyrone Sage.  She reported a right knee injury from when a dog ran into her which has limited her physical activity.   She discussed coronary artery bypass graft with Dr. Laneta Simmers.  Benefits and risk of the surgery were discussed and she agreed to proceed with Surgery.  Her preoperative carotid duplex showed no significant right ICA stenosis and 40-59% left ICA stenosis.  She underwent successful  CABG times 4 08/05/20.  She was discharged 08/24/2020.  She developed postoperative atrial fibrillation on day 5.  She was discharged on Eliquis and amiodarone.  Blood pressure 110/56 with pulse of 59.   She presented the clinic to 822 for follow-up evaluation stated she felt well.  She continued to notice some swelling in her right lower leg.  She was limited in her physical activity due to her bilateral knee pain.  She reported that she has been washing clothes and doing some cooking.  She did continue to follow her  sternal precautions and was walking a minimal amount.  She also reported that she had intermittent episodes of rapid heartbeat.  EKG  showed normal sinus rhythm 62 bpm.  I will gave her the [place] support stocking sheet, had her elevate her lower extremities when  inactive, gave her the salty 6 sheet, repeated her fasting lipids and LFTs, and planned follow-up with Dr. Swaziland in 3 months.   She contacted the nurse triage line on 09/12/2020 and reported that home health nurse had taken her blood pressure.  Her readings were 190/80 and 210/80.  Her pulse was 48 and 54.  Amlodipine 5 mg was added to her medication regimen.  She was instructed to avoid sodium.   She presented to the clinic 10/11/20 for follow-up evaluation stated her blood pressures better controlled with the addition of amlodipine.  However she did note bilateral generalized lower extremity edema.  She reports that she had been trying to maintain a low-sodium diet and had been increasing her physical activity slowly.  She also reported that she had been wearing lower extremity support stockings.  She continued to notice occasional palpitations/flutter.  She reported these were very brief and that she was mainly noticing them in the evening.  She reported that she would start cardiac rehab in April.  I oedered a BMP and CBC  to be drawn the next week.    She was seen in follow-up by Dr. Swaziland on 09/03/2022.  During that time she had been off of amiodarone.  She reported she was unable to take rosuvastatin.  She reported that the medication made her sick.  Her husband had been ill and had major abdominal surgery.  This was stressful for her.  She denied angina and dyspnea.  She did note rare palpitations that were mainly apparent at night.  She had followed up with the lipid clinic.  It was recommended that she take atorvastatin 10 mg and if she was unable to tolerate to begin Repatha.  She had been taking Lipitor for 6 months.  She did note some  nausea but was otherwise okay.  She was preparing for TKA and needed preoperative cardiac evaluation.  She had been an active and had gained 8 pounds.  Follow-up was planned for 6 months.  She was admitted to the hospital on 10/10/2022 and discharged on 10/11/2022.  She reported that she had been sleeping and woke up with 8 out of 10 chest discomfort.  The pain was radiating to both of her shoulders.  She indicated that she had not had that type of chest discomfort and several years.  She also noted that her heart was racing.  She reported that she had a history of panic disorder but it also not had a panic attack for several years.  She denied chest pain at the time of evaluation, shortness of breath, and palpitations.  During exam she was noted to have some memory loss  which she was frustrated with.  Her potassium was slightly low at 3.0.  BNP was 185.5.  Her high-sensitivity troponins were 10 and 58.  Her TSH was normal.  Chest x-ray was negative for acute abnormalities.  Her EKG showed A-fib with RVR with a rate of 150.  She was started on IV diltiazem and converted to sinus rhythm with a rate of 58.  She received supplemental potassium and 40 mg of IV furosemide.  Cardiology was consulted.  She reported that she had undergone knee surgery the week prior.  She was maintaining sinus rhythm.  Echocardiogram is pending.  Her metoprolol 12.5 mg twice daily was continued.  Her CHA2DS2-VASc score was noted to be 5 for CAD, HTN, and age x 2, female.  She was placed on apixaban 5 mg daily.  It was felt that her elevated troponins were due to demand ischemia in the setting of atrial fibrillation with RVR.  TSH repeated on 10/10/2022 noted to be 0.326.  Internal medicine notified.   She presented to the clinic 10/18/22 for follow-up evaluation and stated she was having more pain than expected with her recent knee surgery.  We reviewed her recent emergency department visit and atrial fibrillation.  She expressed  understanding.  She reported that she  had brief flutters that lasted for seconds and dissipated on their own without intervention.  Her EKG  showed sinus bradycardia 56 bpm.  I continued her medications and planned follow-up in 4 months.  She presents to the clinic today for follow-up evaluation and states she notes occasional episodes of palpitations that are brief.  She notices them mainly at night when she lays down on her left side.  She also has noted occasional episodes of dizziness.  She reports that she has noticed a improvement with her short-term memory.  She presents with her husband.  He helps her remember instructions during her clinic appointments.  She has not been very physically active due to home renovations.  She now has a Photographer and plans to go to the gym 3 days/week.  She has her pool open and enjoys having her grandkids come over.  I encouraged her to do some walking in the pool.  I will discontinue her metoprolol to tartrate and start her on metoprolol succinate 12.5 mg daily.  Will plan follow-up in 6 months.  Today she denies chest pain, shortness of breath, lower extremity edema, fatigue, palpitations, melena, hematuria, hemoptysis, diaphoresis, weakness, presyncope, syncope, orthopnea, and PND.    Home Medications    Prior to Admission medications   Medication Sig Start Date End Date Taking? Authorizing Provider  acetaminophen (TYLENOL) 325 MG tablet Take 2 tablets (650 mg total) by mouth every 6 (six) hours as needed. 10/11/22   Almon Hercules, MD  amLODipine (NORVASC) 5 MG tablet TAKE 1 TABLET (5 MG TOTAL) BY MOUTH DAILY. 07/16/22   Swaziland, Peter M, MD  apixaban (ELIQUIS) 5 MG TABS tablet Take 1 tablet (5 mg total) by mouth 2 (two) times daily. 10/11/22   Almon Hercules, MD  atorvastatin (LIPITOR) 10 MG tablet TAKE 1 TABLET BY MOUTH EVERY DAY 04/16/22   Swaziland, Peter M, MD  Cholecalciferol (VITAMIN D) 50 MCG (2000 UT) tablet Take 2,000 Units by mouth daily.     [provider]  cyanocobalamin 1000 MCG tablet Take 1 tablet (1,000 mcg total) by mouth daily. 10/13/22   Almon Hercules, MD  metoprolol tartrate (LOPRESSOR) 25 MG tablet Take 0.5 tablets (12.5  mg total) by mouth 2 (two) times daily. 10/11/22 04/09/23  Almon Hercules, MD  oxyCODONE (ROXICODONE) 5 MG immediate release tablet Take 1 tablet (5 mg total) by mouth every 6 (six) hours as needed for severe pain. 10/15/22   Kathryne Hitch, MD  SODIUM FLUORIDE 5000 PPM 1.1 % PSTE Place 1 Application onto teeth at bedtime. 08/27/22   [provider]  tiZANidine (ZANAFLEX) 2 MG tablet Take 1 tablet (2 mg total) by mouth every 6 (six) hours as needed for muscle spasms. 10/15/22   Kathryne Hitch, MD    Family History    Family History  Problem Relation Age of Onset   Cancer Sister    Heart disease Sister    Heart attack Brother    Heart disease Brother        s/p CABG   Heart attack Father    Heart disease Sister    Heart attack Brother    Heart disease Brother    She indicated that her mother is deceased. She indicated that her father is deceased. She indicated that both of her sisters are alive. She indicated that only one of her two brothers is alive.  Social History    Social History   Socioeconomic History   Marital status: Married    Spouse name: Not on file   Number of children: 3   Years of education: Not on file   Highest education level: Not on file  Occupational History   Occupation: Retired  Tobacco Use   Smoking status: Former    Current packs/day: 0.00    Types: Cigarettes    Quit date: 06/13/2014    Years since quitting: 8.6   Smokeless tobacco: Never  Vaping Use   Vaping status: Former  Substance and Sexual Activity   Alcohol use: Yes    Comment: Occasionally   Drug use: Never   Sexual activity: Not on file  Other Topics Concern   Not on file  Social History Narrative   Not on file   Social Determinants of Health   Financial  Resource Strain: Low Risk  (11/27/2022)   Received from [facility], [facility]   Overall Financial Resource Strain (CARDIA)    Difficulty of Paying Living Expenses: Not hard at all  Food Insecurity: No Food Insecurity (11/27/2022)   Received from [facility], [facility]   Hunger Vital Sign    Worried About Running Out of Food in the Last Year: Never true    Ran Out of Food in the Last Year: Never true  Transportation Needs: No Transportation Needs (11/27/2022)   Received from [employer], [facility]   PRAPARE - Transportation    Lack of Transportation (Medical): No    Lack of Transportation (Non-Medical): No  Physical Activity: Sufficiently Active (11/27/2022)   Received from [facility], [facility]   Exercise Vital Sign    Days of Exercise per Week: 3 days    Minutes of Exercise per Session: 60 min  Stress: No Stress Concern Present (03/29/2021)   Received from [facility], [facility] - Occupational Stress Questionnaire    Feeling of Stress : Only a little  Social Connections: Socially Integrated (11/27/2022)   Received from [facility], [facility]   Social Network    How would you rate your social network (family, work, friends)?: Good participation with social networks  Intimate Partner Violence: Not At Risk (11/27/2022)   Received from [facility], [facility]   HITS    Over the last 12 months how often did your partner physically hurt you?: 1    Over the last 12 months how often did your partner insult you or talk down to you?: 1    Over the last 12 months how often did your partner threaten you with physical harm?: 1    Over the last 12 months how often did your partner scream or curse at you?: 1     Review of Systems    General:  No chills, fever, night sweats or weight changes.  Cardiovascular:  No chest pain, dyspnea on exertion, edema, orthopnea, palpitations, paroxysmal nocturnal  dyspnea. Dermatological: No rash, lesions/masses Respiratory: No cough, dyspnea Urologic: No hematuria, dysuria Abdominal:   No nausea, vomiting, diarrhea, bright red blood per rectum, melena, or hematemesis Neurologic:  No visual changes, wkns, changes in mental status. All other systems reviewed and are otherwise negative except as noted above.  Physical Exam    VS:  BP 116/68 (BP Location: Left Arm, Patient Position: Sitting, Cuff Size: Normal)   Pulse (!) 107   Ht 5\' 5"  (1.651 m)   Wt 140 lb 12.8 oz (63.9 kg)   SpO2 99%   BMI 23.43 kg/m  , BMI Body mass index is 23.43 kg/m. GEN: Well nourished, well developed, in no acute distress. HEENT: normal. Neck: Supple, no JVD, carotid bruits, or masses. Cardiac: RRR, no murmurs, rubs, or gallops. No clubbing, cyanosis, edema.  Radials/DP/PT 2+ and equal bilaterally.  Respiratory:  Respirations regular and unlabored, clear to auscultation bilaterally. GI: Soft, nontender, nondistended, BS + x 4. MS: no deformity or atrophy. Skin: warm and dry, no rash. Neuro:  Strength and sensation are intact. Psych: Normal affect.  Accessory Clinical Findings    Recent Labs: 09/26/2022: ALT 14 10/09/2022: B Natriuretic Peptide 185.5 10/10/2022: TSH 0.326 10/11/2022: BUN 18; Creatinine, Ser 0.77; Hemoglobin 9.8; Magnesium 2.2; Platelets 349; Potassium 3.8; Sodium 139   Recent Lipid Panel    Component Value Date/Time   CHOL 144 10/09/2022 0125   CHOL 155 09/03/2022 1055   TRIG 84 10/09/2022 0125   HDL 44 10/09/2022 0125   HDL 60 09/03/2022 1055   CHOLHDL 3.3 10/09/2022 0125   VLDL 17 10/09/2022 0125   LDLCALC 83 10/09/2022 0125   LDLCALC 81 09/03/2022 1055         ECG personally reviewed by me today-sinus bradycardia 52 bpm- No acute changes  EKG 09/06/2020 normal sinus rhythm T wave abnormality consider anterior ischemia prolonged QT 62 bpm- No acute changes   Echocardiogram 08/16/18   Study Conclusions   - Left ventricle: The  cavity size was normal. There was mild    concentric hypertrophy. Systolic function was normal. The    estimated ejection fraction was in the range of 60% to 65%. Wall    motion was normal; there were no regional wall motion    abnormalities. Doppler parameters are consistent with abnormal    left ventricular relaxation (grade 1 diastolic dysfunction).    There was no evidence of elevated ventricular filling pressure by    Doppler parameters.  - Aortic valve: Valve area (VTI): 2.27 cm^2. Valve area (Vmax): 2.1    cm^2. Valve area (Vmean): 2.24 cm^2.  - Mitral valve: Valve area by pressure half-time: 2.44 cm^2.  - Right ventricle: Systolic function was normal.  - Right atrium: The atrium was normal in size.  - Tricuspid valve: There was no regurgitation.  -  Pulmonary arteries: Systolic pressure was within the normal    range.  - Inferior vena cava: The vessel was normal in size.  - Pericardium, extracardiac: There was no pericardial effusion.   Carotid Dopplers 08/11/2018   Summary:  Right Carotid: The extracranial vessels were near-normal with only minimal  wall                 thickening or plaque.   Left Carotid: Velocities in the left ICA are consistent with a 40-59%  stenosis.  Vertebrals:  Right vertebral artery demonstrates antegrade flow. Left  vertebral               - dampened.  Subclavians: Normal flow hemodynamics were seen in bilateral subclavian               arteries.   Right ABI: Resting right ankle-brachial index is within normal range. No  evidence of significant right lower extremity arterial disease.  Left ABI: Resting left ankle-brachial index is within normal range. No  evidence of significant left lower extremity arterial disease.  Right Upper Extremity: Doppler waveforms remain within normal limits with  right radial compression. Doppler waveforms decrease 50% with right ulnar  compression.  Left Upper Extremity: Doppler waveforms remain within normal  limits with  left radial compression. Doppler waveform obliterate with left ulnar  compression.   Echocardiogram 10/10/2022  IMPRESSIONS     1. Left ventricular ejection fraction, by estimation, is 60 to 65%. The  left ventricle has normal function. The left ventricle has no regional  wall motion abnormalities. Left ventricular diastolic parameters were  grossly normal.   2. Right ventricular systolic function is normal. The right ventricular  size is normal. Tricuspid regurgitation signal is inadequate for assessing  PA pressure.   3. The mitral valve is normal in structure. Trivial mitral valve  regurgitation. No evidence of mitral stenosis.   4. The aortic valve is normal in structure. Aortic valve regurgitation is  not visualized. No aortic stenosis is present.   5. The inferior vena cava is dilated in size with >50% respiratory  variability, suggesting right atrial pressure of 8 mmHg.   FINDINGS   Left Ventricle: Left ventricular ejection fraction, by estimation, is 60  to 65%. The left ventricle has normal function. The left ventricle has no  regional wall motion abnormalities. The left ventricular internal cavity  size was normal in size. There is   no left ventricular hypertrophy. Abnormal (paradoxical) septal motion  consistent with post-operative status. Left ventricular diastolic  parameters were normal.   Right Ventricle: The right ventricular size is normal. No increase in  right ventricular wall thickness. Right ventricular systolic function is  normal. Tricuspid regurgitation signal is inadequate for assessing PA  pressure.   Left Atrium: Left atrial size was normal in size.   Right Atrium: Right atrial size was normal in size.   Pericardium: There is no evidence of pericardial effusion.   Mitral Valve: The mitral valve is normal in structure. Trivial mitral  valve regurgitation. No evidence of mitral valve stenosis.   Tricuspid Valve: The tricuspid valve  is normal in structure. Tricuspid  valve regurgitation is trivial. No evidence of tricuspid stenosis.   Aortic Valve: The aortic valve is normal in structure. Aortic valve  regurgitation is not visualized. No aortic stenosis is present. Aortic  valve mean gradient measures 7.0 mmHg. Aortic valve peak gradient measures  12.8 mmHg. Aortic valve area, by VTI  measures 2.70 cm.  Pulmonic Valve: The pulmonic valve was normal in structure. Pulmonic valve  regurgitation is trivial. No evidence of pulmonic stenosis.   Aorta: The aortic root is normal in size and structure.   Venous: The inferior vena cava is dilated in size with greater than 50%  respiratory variability, suggesting right atrial pressure of 8 mmHg.   IAS/Shunts: No atrial level shunt detected by color flow Doppler.    Assessment & Plan   1.  Atrial fibrillation-denies recent episodes of accelerated irregular heartbeats.  Continues to report compliance with Eliquis.  In, denies bleeding issues.  CHA2DS2-VASc score 5 (CAD, HTN, age x 2, female).  Echocardiogram 10/10/2022 showed normal LVEF trivial mitral valve regurgitation and no other significant abnormalities. Continue  apixaban,  Discontinue metoprolol tartrate Start metoprolol succinate 12.5 mg daily-instructed to take after lunch or before dinner Heart healthy low-sodium diet-salty 6 reviewed Maintain physical activity   Coronary artery disease, demand ischemia-denies recent chest pain or discomfort.  Previously noted to have elevated troponins due to demand ischemia in the setting of A-fib RVR.  Post CABG x4 by Dr. Laneta Simmers on 08/15/2020.   Continue current medical therapy Continue heart healthy diet   Essential hypertension-BP today 116/68   Continue metoprolol, amlodipine Heart healthy low-sodium diet-salty 6 reviewed Increase physical activity as tolerated Maintain blood pressure log   Hyperlipidemia-LDL 83 on 10/09/22. Continue atorvastatin, aspirin Heart  healthy low-sodium high-fiber diet Increase physical activity as tolerated   Carotid artery stenosis-carotid Doppler showed normal right carotid artery with left 40-59% carotid stenosis. Continue rosuvastatin, aspirin Plan for repeat carotid Dopplers 1/25   Disposition: Follow-up with Dr. Swaziland or me in 6 months.   Thomasene Ripple. Jameriah Trotti NP-C     02/14/2023, 11:43 AM [facility] [address] Office [phone] Fax [phone]    I spent 14 minutes examining this patient, reviewing medications, and using patient centered shared decision making involving her cardiac care.  Prior to her visit I spent greater than 20 minutes reviewing her past medical history,  medications, and prior cardiac tests.

## 2023-02-14 ENCOUNTER — Ambulatory Visit: Payer: HMO | Attending: General Practice | Admitting: General Practice

## 2023-02-14 ENCOUNTER — Encounter: Payer: Self-pay | Admitting: General Practice

## 2023-02-14 VITALS — BP 116/68 | HR 107 | Ht 65.0 in | Wt 140.8 lb

## 2023-02-14 DIAGNOSIS — E7800 Pure hypercholesterolemia, unspecified: Secondary | ICD-10-CM

## 2023-02-14 DIAGNOSIS — I6522 Occlusion and stenosis of left carotid artery: Secondary | ICD-10-CM

## 2023-02-14 DIAGNOSIS — I10 Essential (primary) hypertension: Secondary | ICD-10-CM

## 2023-02-14 DIAGNOSIS — I2510 Atherosclerotic heart disease of native coronary artery without angina pectoris: Secondary | ICD-10-CM | POA: Diagnosis not present

## 2023-02-14 DIAGNOSIS — I4891 Unspecified atrial fibrillation: Secondary | ICD-10-CM | POA: Diagnosis not present

## 2023-02-14 MED ORDER — METOPROLOL SUCCINATE ER 25 MG PO TB24: 12.5000 mg | ORAL_TABLET | Freq: Every day | ORAL | 3 refills | Status: DC

## 2023-02-14 NOTE — Patient Instructions (Addendum)
Medication Instructions:  - STOP taking metoprolol tartrate. - START taking metoprolol succinate 0.5 tablet (12.5mg ) daily after lunch.  *If you need a refill on your cardiac medications before your next appointment, please call your pharmacy*   Lab Work: - None ordered   Testing/Procedures: - None ordered   Follow-Up: At [facility], you and your health needs are our priority.  As part of our continuing mission to provide you with exceptional heart care, we have created designated Provider Care Teams.  These Care Teams include your primary Cardiologist (physician) and Advanced Practice Providers (APPs -  Physician Assistants and Nurse Practitioners) who all work together to provide you with the care you need, when you need it.  We recommend signing up for the patient portal called "MyChart".  Sign up information is provided on this After Visit Summary.  MyChart is used to connect with patients for Virtual Visits (Telemedicine).  Patients are able to view lab/test results, encounter notes, upcoming appointments, etc.  Non-urgent messages can be sent to your provider as well.   To learn more about what you can do with MyChart, go to [email or web address].    Your next appointment:   6 month(s)  Provider:   Peter Swaziland, MD  or Edd Fabian, FNP      Other Instructions - Avoid triggers for palpitations (e.g., avoid chocolate and caffeine, limit alcohol intake, stay hydrated)  - Increase physical activity as tolerated (try walking in the pool).

## 2023-02-25 ENCOUNTER — Other Ambulatory Visit (INDEPENDENT_AMBULATORY_CARE_PROVIDER_SITE_OTHER): Payer: HMO

## 2023-02-25 ENCOUNTER — Ambulatory Visit: Payer: HMO | Admitting: Orthopaedic Surgery

## 2023-02-25 ENCOUNTER — Encounter: Payer: Self-pay | Admitting: Orthopaedic Surgery

## 2023-02-25 DIAGNOSIS — Z96651 Presence of right artificial knee joint: Secondary | ICD-10-CM

## 2023-02-25 NOTE — Progress Notes (Signed)
The patient is a 77 year old female who is now over 4 months status post a right total knee arthroplasty.  She says she is doing much better overall now with good range of motion and strength.  Her postoperative course was complicated by some memory issues and this was scary for her.  She does have arthritis in her left knee but right now is hesitant to proceed with any type of surgery given how this affected her memory.  She is doing much better overall she states.  Family is with her today as well.  She is walking without assistive device.  She says her knee is doing great.  On exam there is still some swelling and warmth to be expected.  It is only been just over 4 months.  Her extension is full and I can flex her to just past 90 degrees.  An AP standing and lateral of the right knee shows a well-seated right total knee arthroplasty with no complicating features.  From my standpoint she will continue to increase her activities as comfort allows.  We can certainly see her back in 6 months to see how she is doing overall but no x-rays are needed unless she is having any issues.

## 2023-03-04 ENCOUNTER — Ambulatory Visit: Payer: HMO | Admitting: Neurology

## 2023-03-10 ENCOUNTER — Other Ambulatory Visit: Payer: Self-pay | Admitting: General Practice

## 2023-03-26 ENCOUNTER — Telehealth: Payer: Self-pay | Admitting: Neurology

## 2023-03-26 ENCOUNTER — Ambulatory Visit: Payer: HMO | Admitting: Neurology

## 2023-03-26 NOTE — Telephone Encounter (Signed)
NS for Neuro Consult.

## 2023-03-26 NOTE — Telephone Encounter (Signed)
Ok looks like this is her first one here.

## 2023-03-31 ENCOUNTER — Other Ambulatory Visit: Payer: Self-pay | Admitting: General Practice

## 2023-04-16 ENCOUNTER — Other Ambulatory Visit (HOSPITAL_COMMUNITY): Payer: Self-pay

## 2023-08-09 ENCOUNTER — Ambulatory Visit (HOSPITAL_COMMUNITY)
Admission: RE | Admit: 2023-08-09 | Discharge: 2023-08-09 | Disposition: A | Discharge status: Home / Self Care | Payer: HMO | Source: Ambulatory Visit | Attending: Cardiology | Admitting: Cardiology

## 2023-08-09 ENCOUNTER — Other Ambulatory Visit: Payer: Self-pay | Admitting: Cardiology

## 2023-08-09 DIAGNOSIS — I6522 Occlusion and stenosis of left carotid artery: Secondary | ICD-10-CM | POA: Diagnosis present

## 2023-08-20 NOTE — Progress Notes (Unsigned)
Cardiology Office Note   Date:  08/21/2023   ID:  Rajanee, Wences April 26, 1946, MRN [id number]  PCP:  Porfirio Oar, PA  Cardiologist:   Markeis Allman [place], MD   Chief Complaint  Patient presents with   Coronary Artery Disease   Atrial Fibrillation       History of Present Illness: Debbie Cardenas is a 78 y.o. female who presents for follow up of Afib and CAD. She needs surgical clearance for TKR. She has a PMH of hypertension, hyperlipidemia, degenerative arthritis of both knees, and a family history of coronary artery disease.  She is a former smoker.  She underwent coronary CTA which showed a calcium score over 1000 and stenosis that was flow-limiting in the RCA and LAD.  It was felt to be high risk. She had a cardiac event monitor which showed normal sinus rhythm.  She underwent cardiac catheterization 07/08/2020 which showed 70% mid-distal left main stenosis.  50% ostial-proximal LAD stenosis and 70% proximal mid LAD stenosis, moderate size diagonal branch was noted off the area of stenosis, first marginal was noted to have 50% stenosis, ostium of RCA had 90% stenosis, and LV EF was 55-60% with normal LVEDP.    She discussed coronary artery bypass graft with Dr. Laneta Simmers.   Her preoperative carotid duplex showed no significant right ICA stenosis and 40-59% left ICA stenosis.  She underwent successful CABG x 4 on 08/05/20.  She was discharged 08/24/2020.  She developed postoperative atrial fibrillation on day 5.  She was discharged on Eliquis and amiodarone.  When seen for follow up she was in NSR.    She was seen in the lipid clinic. Had prior intolerance to low dose Crestor. Has been on Lipitor 10 mg daily and seems to be tolerating.   She was admitted to the hospital on 10/10/2022 and discharged on 10/11/2022.  She reported that she had been sleeping and woke up with 8 out of 10 chest discomfort.  The pain was radiating to both of her shoulders.  She indicated that she had not had that  type of chest discomfort and several years.  She also noted that her heart was racing.  She reported that she had a history of panic disorder but it also not had a panic attack for several years.  She denied chest pain at the time of evaluation, shortness of breath, and palpitations.  During exam she was noted to have some memory loss which she was frustrated with.  Her potassium was slightly low at 3.0.  BNP was 185.5.  Her high-sensitivity troponins were 10 and 58.  Her TSH was normal.  Chest x-ray was negative for acute abnormalities.  Her EKG showed A-fib with RVR with a rate of 150.  She was started on IV diltiazem and converted to sinus rhythm with a rate of 58.  She received supplemental potassium and 40 mg of IV furosemide.   Cardiology was consulted.  She had undergone knee surgery the week prior.  She was maintaining sinus rhythm.  Echocardiogram is pending.  Her metoprolol 12.5 mg twice daily was continued.  Her CHA2DS2-VASc score was noted to be 5 for CAD, HTN, and age x 2, female.  She was placed on apixaban 5 mg daily.  It was felt that her elevated troponins were due to demand ischemia in the setting of atrial fibrillation with RVR.    On follow up today she reports she just hasn't done well since her knee surgery. Complains  about significant memory loss from that. Complains of pain in her left knee. Has a rash on her right knee. No chest pain or dyspnea. Has short lived palpitations at night on occasion but nothing sustained.   Past Medical History:  Diagnosis Date   Anxiety    Arthritis    knees   Closed fracture of neck of left femur (HCC) 08/15/2018   Coronary artery disease    Hyperlipidemia    Hypertension    Pneumonia     Past Surgical History:  Procedure Laterality Date   APPENDECTOMY     CARDIAC CATHETERIZATION     CATARACT EXTRACTION, BILATERAL     CHOLECYSTECTOMY     CORONARY ARTERY BYPASS GRAFT N/A 08/15/2020   Procedure: CORONARY ARTERY BYPASS GRAFTING (CABG)X4.  USING BILATERAL MAMMARY ARTERIES AND RIGHT GREATER SAPHENOUS VEIN HARVESTED ENDOSCOPICALLY.;  Surgeon: Alleen Borne, MD;  Location: [facility] OR;  Service: Open Heart Surgery;  Laterality: N/A;   EYE SURGERY     bilateral cataract removal   KNEE SURGERY     LEFT HEART CATH AND CORONARY ANGIOGRAPHY N/A 07/27/2020   Procedure: LEFT HEART CATH AND CORONARY ANGIOGRAPHY;  Surgeon: [place], Lovelee Forner M, MD;  Location: [facility] INVASIVE CV LAB;  Service: Cardiovascular;  Laterality: N/A;   TEE WITHOUT CARDIOVERSION N/A 08/15/2020   Procedure: TRANSESOPHAGEAL ECHOCARDIOGRAM (TEE);  Surgeon: Alleen Borne, MD;  Location: [facility] OR;  Service: Open Heart Surgery;  Laterality: N/A;   TOTAL HIP ARTHROPLASTY Left 08/15/2018   Procedure: TOTAL HIP ARTHROPLASTY ANTERIOR APPROACH;  Surgeon: Kathryne Hitch, MD;  Location: [facility] ORS;  Service: Orthopedics;  Laterality: Left;   TOTAL KNEE ARTHROPLASTY Right 10/02/2022   Procedure: RIGHT TOTAL KNEE ARTHROPLASTY;  Surgeon: Kathryne Hitch, MD;  Location: [facility] OR;  Service: Orthopedics;  Laterality: Right;     Current Outpatient Medications  Medication Sig Dispense Refill   acetaminophen (TYLENOL) 325 MG tablet Take 2 tablets (650 mg total) by mouth every 6 (six) hours as needed.     amLODipine (NORVASC) 5 MG tablet TAKE 1 TABLET (5 MG TOTAL) BY MOUTH DAILY. 90 tablet 1   atorvastatin (LIPITOR) 10 MG tablet TAKE 1 TABLET BY MOUTH EVERY DAY 90 tablet 3   Cholecalciferol (VITAMIN D) 50 MCG (2000 UT) tablet Take 2,000 Units by mouth daily.     cyanocobalamin 1000 MCG tablet Take 1 tablet (1,000 mcg total) by mouth daily. 90 tablet 1   ELIQUIS 5 MG TABS tablet TAKE 1 TABLET BY MOUTH TWICE A DAY 60 tablet 5   metoprolol succinate (TOPROL XL) 25 MG 24 hr tablet Take 0.5 tablets (12.5 mg total) by mouth daily. Take between lunch and dinner. 45 tablet 3   SODIUM FLUORIDE 5000 PPM 1.1 % PSTE Place 1 Application onto teeth at bedtime.     traMADol (ULTRAM) 50 MG tablet Take 50 mg  by mouth every 6 (six) hours as needed.     No current facility-administered medications for this visit.    Allergies:   Tramadol, Ephedrine, and Ace inhibitors    Social History:  The patient  reports that she quit smoking about 9 years ago. Her smoking use included cigarettes. She has never used smokeless tobacco. She reports current alcohol use. She reports that she does not use drugs.   Family History:  The patient's family history includes Cancer in her sister; Heart attack in her brother, brother, and father; Heart disease in her brother, brother, sister, and sister.    ROS:  Please  see the history of present illness.   Otherwise, review of systems are positive for none.   All other systems are reviewed and negative.    PHYSICAL EXAM: VS:  BP (!) 148/58 (BP Location: Left Arm, Cuff Size: Large)   Pulse 61   Ht 5\' 5"  (1.651 m)   Wt 157 lb (71.2 kg)   SpO2 98%   BMI 26.13 kg/m  , BMI Body mass index is 26.13 kg/m. GEN: Well nourished, well developed, in no acute distress  HEENT: normal  Neck: no JVD, carotid bruits, or masses Cardiac: RRR; no murmurs, rubs, or gallops,no edema  Respiratory:  clear to auscultation bilaterally, normal work of breathing GI: soft, nontender, nondistended, + BS MS: no deformity or atrophy  Skin: warm and dry, no rash Neuro:  Strength and sensation are intact Psych: euthymic mood, full affect   EKG:  EKG is not ordered today.   Recent Labs: 09/26/2022: ALT 14 10/09/2022: B Natriuretic Peptide 185.5 10/10/2022: TSH 0.326 10/11/2022: BUN 18; Creatinine, Ser 0.77; Hemoglobin 9.8; Magnesium 2.2; Platelets 349; Potassium 3.8; Sodium 139    Lipid Panel    Component Value Date/Time   CHOL 144 10/09/2022 0125   CHOL 155 09/03/2022 1055   TRIG 84 10/09/2022 0125   HDL 44 10/09/2022 0125   HDL 60 09/03/2022 1055   CHOLHDL 3.3 10/09/2022 0125   VLDL 17 10/09/2022 0125   LDLCALC 83 10/09/2022 0125   LDLCALC 81 09/03/2022 1055    Labs dated  03/29/21: cholesterol 169, triglycerides 74, HDL 71, LDL 84. CMET normal.  Dated 04/27/21: CBC normal.  Wt Readings from Last 3 Encounters:  08/21/23 157 lb (71.2 kg)  02/14/23 140 lb 12.8 oz (63.9 kg)  10/18/22 150 lb (68 kg)      Other studies Reviewed: Additional studies/ records that were reviewed today include:   Echocardiogram 08/16/18   Study Conclusions   - Left ventricle: The cavity size was normal. There was mild    concentric hypertrophy. Systolic function was normal. The    estimated ejection fraction was in the range of 60% to 65%. Wall    motion was normal; there were no regional wall motion    abnormalities. Doppler parameters are consistent with abnormal    left ventricular relaxation (grade 1 diastolic dysfunction).    There was no evidence of elevated ventricular filling pressure by    Doppler parameters.  - Aortic valve: Valve area (VTI): 2.27 cm^2. Valve area (Vmax): 2.1    cm^2. Valve area (Vmean): 2.24 cm^2.  - Mitral valve: Valve area by pressure half-time: 2.44 cm^2.  - Right ventricle: Systolic function was normal.  - Right atrium: The atrium was normal in size.  - Tricuspid valve: There was no regurgitation.  - Pulmonary arteries: Systolic pressure was within the normal    range.  - Inferior vena cava: The vessel was normal in size.  - Pericardium, extracardiac: There was no pericardial effusion.   Carotid Dopplers 08/11/2018   Summary:  Right Carotid: The extracranial vessels were near-normal with only minimal  wall                 thickening or plaque.   Left Carotid: Velocities in the left ICA are consistent with a 40-59%  stenosis.  Vertebrals:  Right vertebral artery demonstrates antegrade flow. Left  vertebral               - dampened.  Subclavians: Normal flow hemodynamics were seen in bilateral subclavian  arteries.   Right ABI: Resting right ankle-brachial index is within normal range. No  evidence of significant right  lower extremity arterial disease.  Left ABI: Resting left ankle-brachial index is within normal range. No  evidence of significant left lower extremity arterial disease.  Right Upper Extremity: Doppler waveforms remain within normal limits with  right radial compression. Doppler waveforms decrease 50% with right ulnar  compression.  Left Upper Extremity: Doppler waveforms remain within normal limits with  left radial compression. Doppler waveform obliterate with left ulnar  compression.   Cardiac cath 07/27/20:  LEFT HEART CATH AND CORONARY ANGIOGRAPHY    Conclusion    Mid LM to Dist LM lesion is 70% stenosed. Ost LAD to Prox LAD lesion is 50% stenosed. Prox LAD to Mid LAD lesion is 70% stenosed. 1st Mrg lesion is 50% stenosed. Dist Cx lesion is 30% stenosed with 30% stenosed side branch in LPAV. Ost RCA to Prox RCA lesion is 90% stenosed. The left ventricular systolic function is normal. LV end diastolic pressure is normal. The left ventricular ejection fraction is 55-65% by visual estimate.   1. 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 Normal LV function 3. Normal LVEDP   Plan: patient has high risk anatomy that is not suitable for PCI. Recommend CT surgery evaluation for CABG.    Diagnostic Dominance: Co-dominant    Intervention    ASSESSMENT AND PLAN:  1. Coronary artery disease-  Status post CABG x4 by Dr. Laneta Simmers on 08/15/2020.  no anginal symptoms Continue aspirin No beta blocker due to history of bradycardia.  Heart healthy low-sodium diet Continue statin   2. Atrial fibrillation- post op CABG and post op Knee surgery  appears to be maintaining NSR. Continue Eliquis and metoprolol   3. Hyperlipidemia- unable to tolerate even low dose Crestor. Now on lipitor 10 mg daily. Will update lab today  4. Carotid artery stenosis-carotid Doppler in past showed normal right carotid artery with left 40-59% carotid  stenosis. Repeat carotid Dopplers 08/09/23 showed < 39% stenosis bilaterally   5. HTN -elevated today but has been well controlled at home - recommend she monitor BP more closely at home. Continue metoprolol and amlodipine   Disposition:   FU one year  Signed, Tiani Stanbery [place], MD  08/21/2023 11:49 AM    [facility] [address] [place], [place], [address] Phone [phone], Fax [phone]

## 2023-08-21 ENCOUNTER — Encounter: Payer: Self-pay | Admitting: Cardiology

## 2023-08-21 ENCOUNTER — Ambulatory Visit: Payer: HMO | Attending: Cardiology | Admitting: Cardiology

## 2023-08-21 VITALS — BP 148/58 | HR 61 | Ht 65.0 in | Wt 157.0 lb

## 2023-08-21 DIAGNOSIS — I4891 Unspecified atrial fibrillation: Secondary | ICD-10-CM

## 2023-08-21 DIAGNOSIS — E7849 Other hyperlipidemia: Secondary | ICD-10-CM

## 2023-08-21 DIAGNOSIS — I10 Essential (primary) hypertension: Secondary | ICD-10-CM | POA: Diagnosis not present

## 2023-08-21 DIAGNOSIS — I25119 Atherosclerotic heart disease of native coronary artery with unspecified angina pectoris: Secondary | ICD-10-CM

## 2023-08-21 MED ORDER — APIXABAN 5 MG PO TABS: 5.0000 mg | ORAL_TABLET | Freq: Two times a day (BID) | ORAL | 3 refills | Status: DC

## 2023-08-21 NOTE — Addendum Note (Signed)
Addended by: Neoma Laming on: 08/21/2023 12:04 PM   Modules accepted: Orders

## 2023-08-21 NOTE — Patient Instructions (Signed)
Medication Instructions:  Continue same medications *If you need a refill on your cardiac medications before your next appointment, please call your pharmacy*   Lab Work: Cmet and lipid panel today   Testing/Procedures: None ordered   Follow-Up: At [facility], you and your health needs are our priority.  As part of our continuing mission to provide you with exceptional heart care, we have created designated Provider Care Teams.  These Care Teams include your primary Cardiologist (physician) and Advanced Practice Providers (APPs -  Physician Assistants and Nurse Practitioners) who all work together to provide you with the care you need, when you need it.  We recommend signing up for the patient portal called "MyChart".  Sign up information is provided on this After Visit Summary.  MyChart is used to connect with patients for Virtual Visits (Telemedicine).  Patients are able to view lab/test results, encounter notes, upcoming appointments, etc.  Non-urgent messages can be sent to your provider as well.   To learn more about what you can do with MyChart, go to [email or web address].    Your next appointment:  1 year   Call in Oct to schedule Jan appointment  [facility] )    Provider:  Dr.Jordan

## 2023-08-22 LAB — COMPREHENSIVE METABOLIC PANEL
ALT: 11 [IU]/L (ref 0–32)
AST: 19 [IU]/L (ref 0–40)
Albumin: 4.4 g/dL (ref 3.8–4.8)
Alkaline Phosphatase: 77 [IU]/L (ref 44–121)
BUN/Creatinine Ratio: 18 (ref 12–28)
BUN: 17 mg/dL (ref 8–27)
Bilirubin Total: 0.7 mg/dL (ref 0.0–1.2)
CO2: 23 mmol/L (ref 20–29)
Calcium: 9.9 mg/dL (ref 8.7–10.3)
Chloride: 103 mmol/L (ref 96–106)
Creatinine, Ser: 0.97 mg/dL (ref 0.57–1.00)
Globulin, Total: 2.5 g/dL (ref 1.5–4.5)
Glucose: 92 mg/dL (ref 70–99)
Potassium: 4.7 mmol/L (ref 3.5–5.2)
Sodium: 142 mmol/L (ref 134–144)
Total Protein: 6.9 g/dL (ref 6.0–8.5)
eGFR: 61 mL/min/{1.73_m2} (ref >59)

## 2023-08-22 LAB — LIPID PANEL
Chol/HDL Ratio: 2.6 {ratio} (ref 0.0–4.4)
Cholesterol, Total: 169 mg/dL (ref 100–199)
HDL: 65 mg/dL (ref >39)
LDL Chol Calc (NIH): 89 mg/dL (ref 0–99)
Triglycerides: 80 mg/dL (ref 0–149)
VLDL Cholesterol Cal: 15 mg/dL (ref 5–40)

## 2023-08-23 ENCOUNTER — Other Ambulatory Visit: Payer: Self-pay

## 2023-08-23 DIAGNOSIS — E7849 Other hyperlipidemia: Secondary | ICD-10-CM

## 2023-08-23 DIAGNOSIS — I25119 Atherosclerotic heart disease of native coronary artery with unspecified angina pectoris: Secondary | ICD-10-CM

## 2023-08-23 MED ORDER — EZETIMIBE 10 MG PO TABS: 10.0000 mg | ORAL_TABLET | Freq: Every day | ORAL | 3 refills | Status: DC

## 2023-08-28 ENCOUNTER — Ambulatory Visit (INDEPENDENT_AMBULATORY_CARE_PROVIDER_SITE_OTHER): Payer: HMO | Admitting: Orthopaedic Surgery

## 2023-08-28 ENCOUNTER — Encounter: Payer: Self-pay | Admitting: Orthopaedic Surgery

## 2023-08-28 DIAGNOSIS — R21 Rash and other nonspecific skin eruption: Secondary | ICD-10-CM | POA: Diagnosis not present

## 2023-08-28 DIAGNOSIS — Z96651 Presence of right artificial knee joint: Secondary | ICD-10-CM

## 2023-08-28 MED ORDER — DOXYCYCLINE HYCLATE 100 MG PO TABS
100.0000 mg | ORAL_TABLET | Freq: Two times a day (BID) | ORAL | 0 refills | Status: AC
Start: 2023-08-28 — End: 2023-09-11

## 2023-08-28 NOTE — Progress Notes (Signed)
HPI: [name] comes in today follow-up of her right total knee arthroplasty 10/02/2022.  She states for the last 3 or more weeks she has had a rash to the lateral aspect of her right knee.  No known injury.  No medications, soaps or laundry detergent changes.  She states these areas of redness just lateral to the incision started as almost like insect bites.  Given the developed into red rash.  She had itching initially.  She does note she had a couple red dots on her abdomen but these have resolved.  She has tried antibacterial soap which has helped and also cortisone cream.  Denies any fevers chills.  States overall the right knee is doing well.  Review of systems: See HPI otherwise negative  Physical exam: General Well-developed well-nourished female in no acute distress. Right knee full extension full flexion.  No instability valgus varus stressing.  Surgical incisions well-healed.Maculopapular rash lateral aspect right knee.  No significant erythema no effusion.  Impression: Status post right total knee arthroplasty 10/02/2022 Right knee rash  Plan: Will place her on doxycycline empirically for 14 days.  She is given 2.5% hydrocortisone cream that she can apply the knee twice daily.  She will follow-up with [place] in 1 month to see how she is doing sooner if there is any questions concerns.  Questions were encouraged and answered by Dr. Magnus Ivan myself.

## 2024-01-20 ENCOUNTER — Other Ambulatory Visit: Payer: Self-pay | Admitting: General Practice

## 2024-01-20 ENCOUNTER — Other Ambulatory Visit: Payer: Self-pay | Admitting: Cardiology

## 2024-06-01 ENCOUNTER — Encounter: Payer: Self-pay | Admitting: Radiology

## 2024-07-09 ENCOUNTER — Other Ambulatory Visit: Payer: Self-pay | Admitting: Cardiology

## 2024-07-17 ENCOUNTER — Emergency Department (HOSPITAL_COMMUNITY)

## 2024-07-17 ENCOUNTER — Inpatient Hospital Stay (HOSPITAL_COMMUNITY)
Admission: EM | Admit: 2024-07-17 | Discharge: 2024-07-28 | DRG: 884 | Disposition: A | Discharge status: Skilled Nursing Facility | Attending: Internal Medicine | Admitting: Internal Medicine

## 2024-07-17 ENCOUNTER — Other Ambulatory Visit: Payer: Self-pay

## 2024-07-17 DIAGNOSIS — N3001 Acute cystitis with hematuria: Secondary | ICD-10-CM

## 2024-07-17 DIAGNOSIS — I160 Hypertensive urgency: Secondary | ICD-10-CM | POA: Diagnosis present

## 2024-07-17 DIAGNOSIS — F03A2 Unspecified dementia, mild, with psychotic disturbance: Secondary | ICD-10-CM | POA: Diagnosis present

## 2024-07-17 DIAGNOSIS — Z7901 Long term (current) use of anticoagulants: Secondary | ICD-10-CM

## 2024-07-17 DIAGNOSIS — Z79899 Other long term (current) drug therapy: Secondary | ICD-10-CM | POA: Diagnosis not present

## 2024-07-17 DIAGNOSIS — Z8249 Family history of ischemic heart disease and other diseases of the circulatory system: Secondary | ICD-10-CM | POA: Diagnosis not present

## 2024-07-17 DIAGNOSIS — W19XXXA Unspecified fall, initial encounter: Secondary | ICD-10-CM | POA: Diagnosis present

## 2024-07-17 DIAGNOSIS — G9341 Metabolic encephalopathy: Secondary | ICD-10-CM | POA: Diagnosis not present

## 2024-07-17 DIAGNOSIS — Z809 Family history of malignant neoplasm, unspecified: Secondary | ICD-10-CM

## 2024-07-17 DIAGNOSIS — N200 Calculus of kidney: Secondary | ICD-10-CM | POA: Diagnosis present

## 2024-07-17 DIAGNOSIS — E538 Deficiency of other specified B group vitamins: Secondary | ICD-10-CM | POA: Diagnosis present

## 2024-07-17 DIAGNOSIS — R319 Hematuria, unspecified: Secondary | ICD-10-CM

## 2024-07-17 DIAGNOSIS — R4182 Altered mental status, unspecified: Secondary | ICD-10-CM | POA: Diagnosis not present

## 2024-07-17 DIAGNOSIS — N390 Urinary tract infection, site not specified: Secondary | ICD-10-CM

## 2024-07-17 DIAGNOSIS — Z96642 Presence of left artificial hip joint: Secondary | ICD-10-CM | POA: Diagnosis present

## 2024-07-17 DIAGNOSIS — I2510 Atherosclerotic heart disease of native coronary artery without angina pectoris: Secondary | ICD-10-CM | POA: Diagnosis present

## 2024-07-17 DIAGNOSIS — R569 Unspecified convulsions: Secondary | ICD-10-CM | POA: Diagnosis not present

## 2024-07-17 DIAGNOSIS — R410 Disorientation, unspecified: Secondary | ICD-10-CM | POA: Diagnosis not present

## 2024-07-17 DIAGNOSIS — Z1152 Encounter for screening for COVID-19: Secondary | ICD-10-CM | POA: Diagnosis not present

## 2024-07-17 DIAGNOSIS — Y92009 Unspecified place in unspecified non-institutional (private) residence as the place of occurrence of the external cause: Secondary | ICD-10-CM | POA: Diagnosis not present

## 2024-07-17 DIAGNOSIS — E785 Hyperlipidemia, unspecified: Secondary | ICD-10-CM | POA: Diagnosis present

## 2024-07-17 DIAGNOSIS — R404 Transient alteration of awareness: Secondary | ICD-10-CM | POA: Diagnosis not present

## 2024-07-17 DIAGNOSIS — Z87891 Personal history of nicotine dependence: Secondary | ICD-10-CM

## 2024-07-17 DIAGNOSIS — F03A4 Unspecified dementia, mild, with anxiety: Secondary | ICD-10-CM | POA: Diagnosis present

## 2024-07-17 DIAGNOSIS — I10 Essential (primary) hypertension: Secondary | ICD-10-CM | POA: Diagnosis present

## 2024-07-17 DIAGNOSIS — Z885 Allergy status to narcotic agent status: Secondary | ICD-10-CM

## 2024-07-17 DIAGNOSIS — Z96651 Presence of right artificial knee joint: Secondary | ICD-10-CM | POA: Diagnosis present

## 2024-07-17 DIAGNOSIS — Z951 Presence of aortocoronary bypass graft: Secondary | ICD-10-CM | POA: Diagnosis not present

## 2024-07-17 DIAGNOSIS — R296 Repeated falls: Secondary | ICD-10-CM | POA: Diagnosis present

## 2024-07-17 DIAGNOSIS — R400 Somnolence: Secondary | ICD-10-CM | POA: Diagnosis not present

## 2024-07-17 DIAGNOSIS — R441 Visual hallucinations: Secondary | ICD-10-CM | POA: Diagnosis present

## 2024-07-17 DIAGNOSIS — Z888 Allergy status to other drugs, medicaments and biological substances status: Secondary | ICD-10-CM

## 2024-07-17 DIAGNOSIS — I480 Paroxysmal atrial fibrillation: Secondary | ICD-10-CM | POA: Diagnosis present

## 2024-07-17 DIAGNOSIS — F0390 Unspecified dementia without behavioral disturbance: Secondary | ICD-10-CM | POA: Diagnosis not present

## 2024-07-17 LAB — COMPREHENSIVE METABOLIC PANEL WITH GFR
ALT: 11 U/L (ref 0–44)
AST: 22 U/L (ref 15–41)
Albumin: 4.2 g/dL (ref 3.5–5.0)
Alkaline Phosphatase: 67 U/L (ref 38–126)
Anion gap: 10 (ref 5–15)
BUN: 23 mg/dL (ref 8–23)
CO2: 27 mmol/L (ref 22–32)
Calcium: 9.7 mg/dL (ref 8.9–10.3)
Chloride: 105 mmol/L (ref 98–111)
Creatinine, Ser: 0.88 mg/dL (ref 0.44–1.00)
GFR, Estimated: >60 mL/min
Glucose, Bld: 93 mg/dL (ref 70–99)
Potassium: 3.7 mmol/L (ref 3.5–5.1)
Sodium: 141 mmol/L (ref 135–145)
Total Bilirubin: 0.7 mg/dL (ref 0.0–1.2)
Total Protein: 6.6 g/dL (ref 6.5–8.1)

## 2024-07-17 LAB — CBC WITH DIFFERENTIAL/PLATELET
Abs Immature Granulocytes: 0.01 K/uL (ref 0.00–0.07)
Basophils Absolute: 0.1 K/uL (ref 0.0–0.1)
Basophils Relative: 1 %
Eosinophils Absolute: 0.1 K/uL (ref 0.0–0.5)
Eosinophils Relative: 2 %
HCT: 37.1 % (ref 36.0–46.0)
Hemoglobin: 12.4 g/dL (ref 12.0–15.0)
Immature Granulocytes: 0 %
Lymphocytes Relative: 31 %
Lymphs Abs: 1.6 K/uL (ref 0.7–4.0)
MCH: 31.3 pg (ref 26.0–34.0)
MCHC: 33.4 g/dL (ref 30.0–36.0)
MCV: 93.7 fL (ref 80.0–100.0)
Monocytes Absolute: 0.5 K/uL (ref 0.1–1.0)
Monocytes Relative: 9 %
Neutro Abs: 2.9 K/uL (ref 1.7–7.7)
Neutrophils Relative %: 57 %
Platelets: 215 K/uL (ref 150–400)
RBC: 3.96 MIL/uL (ref 3.87–5.11)
RDW: 13.2 % (ref 11.5–15.5)
WBC: 5.2 K/uL (ref 4.0–10.5)
nRBC: 0 % (ref 0.0–0.2)

## 2024-07-17 LAB — D-DIMER, QUANTITATIVE: D-Dimer, Quant: 0.6 ug{FEU}/mL — ABNORMAL HIGH (ref 0.00–0.50)

## 2024-07-17 LAB — URINALYSIS, W/ REFLEX TO CULTURE (INFECTION SUSPECTED)
Bilirubin Urine: NEGATIVE
Glucose, UA: NEGATIVE mg/dL
Ketones, ur: NEGATIVE mg/dL
Nitrite: NEGATIVE
Protein, ur: NEGATIVE mg/dL
RBC / HPF: >50 RBC/hpf (ref 0–5)
Specific Gravity, Urine: 1.015 (ref 1.005–1.030)
pH: 7 (ref 5.0–8.0)

## 2024-07-17 LAB — MAGNESIUM: Magnesium: 2.1 mg/dL (ref 1.7–2.4)

## 2024-07-17 LAB — C-REACTIVE PROTEIN: CRP: <3 mg/dL — ABNORMAL HIGH

## 2024-07-17 LAB — RESP PANEL BY RT-PCR (RSV, FLU A&B, COVID)  RVPGX2
Influenza A by PCR: NEGATIVE
Influenza B by PCR: NEGATIVE
Resp Syncytial Virus by PCR: NEGATIVE
SARS Coronavirus 2 by RT PCR: NEGATIVE

## 2024-07-17 LAB — AMMONIA: Ammonia: 35 umol/L (ref 9–35)

## 2024-07-17 LAB — HEMOGLOBIN A1C
Hgb A1c MFr Bld: 4.8 % (ref 4.8–5.6)
Mean Plasma Glucose: 91.06 mg/dL

## 2024-07-17 LAB — TROPONIN T, HIGH SENSITIVITY: Troponin T High Sensitivity: 20 ng/L — ABNORMAL HIGH (ref 0–19)

## 2024-07-17 LAB — CBG MONITORING, ED: Glucose-Capillary: 93 mg/dL (ref 70–99)

## 2024-07-17 MED ORDER — FOLIC ACID 1 MG PO TABS
1.0000 mg | ORAL_TABLET | Freq: Every day | ORAL | Status: DC
  Administered 2024-07-17 – 2024-07-28 (×12): 1 mg via ORAL
  Filled 2024-07-17 (×10): qty 1

## 2024-07-17 MED ORDER — ADULT MULTIVITAMIN W/MINERALS CH
1.0000 | ORAL_TABLET | Freq: Every day | ORAL | Status: DC
  Administered 2024-07-17 – 2024-07-28 (×12): 1 via ORAL
  Filled 2024-07-17 (×10): qty 1

## 2024-07-17 MED ORDER — ATORVASTATIN CALCIUM 10 MG PO TABS
10.0000 mg | ORAL_TABLET | Freq: Every day | ORAL | Status: DC
  Administered 2024-07-18 – 2024-07-28 (×11): 10 mg via ORAL
  Filled 2024-07-17 (×10): qty 1

## 2024-07-17 MED ORDER — APIXABAN 5 MG PO TABS
5.0000 mg | ORAL_TABLET | Freq: Two times a day (BID) | ORAL | Status: DC
  Administered 2024-07-17 – 2024-07-28 (×22): 5 mg via ORAL
  Filled 2024-07-17 (×19): qty 1

## 2024-07-17 MED ORDER — LACTATED RINGERS IV SOLN: INTRAVENOUS | Status: AC

## 2024-07-17 MED ORDER — EZETIMIBE 10 MG PO TABS
10.0000 mg | ORAL_TABLET | Freq: Every day | ORAL | Status: DC
  Administered 2024-07-18 – 2024-07-28 (×11): 10 mg via ORAL
  Filled 2024-07-17 (×9): qty 1

## 2024-07-17 MED ORDER — AMLODIPINE BESYLATE 5 MG PO TABS
5.0000 mg | ORAL_TABLET | Freq: Once | ORAL | Status: AC
  Administered 2024-07-17: 5 mg via ORAL
  Filled 2024-07-17: qty 1

## 2024-07-17 MED ORDER — HYDRALAZINE HCL 20 MG/ML IJ SOLN: 10.0000 mg | Freq: Four times a day (QID) | INTRAMUSCULAR | Status: DC | PRN

## 2024-07-17 MED ORDER — ACETAMINOPHEN 650 MG RE SUPP: 650.0000 mg | Freq: Four times a day (QID) | RECTAL | Status: DC | PRN

## 2024-07-17 MED ORDER — VITAMIN B-12 1000 MCG PO TABS
1000.0000 ug | ORAL_TABLET | Freq: Every day | ORAL | Status: DC
  Administered 2024-07-17 – 2024-07-21 (×5): 1000 ug via ORAL
  Filled 2024-07-17 (×5): qty 1

## 2024-07-17 MED ORDER — HYDRALAZINE HCL 20 MG/ML IJ SOLN: 5.0000 mg | Freq: Once | INTRAMUSCULAR | Status: DC

## 2024-07-17 MED ORDER — SODIUM CHLORIDE 0.9 % IV SOLN
1.0000 g | Freq: Once | INTRAVENOUS | Status: AC
  Administered 2024-07-17: 1 g via INTRAVENOUS
  Filled 2024-07-17: qty 10

## 2024-07-17 MED ORDER — VITAMIN D 25 MCG (1000 UNIT) PO TABS
2000.0000 [IU] | ORAL_TABLET | Freq: Every day | ORAL | Status: DC
  Administered 2024-07-17 – 2024-07-28 (×12): 2000 [IU] via ORAL
  Filled 2024-07-17 (×10): qty 2

## 2024-07-17 MED ORDER — ALBUTEROL SULFATE (2.5 MG/3ML) 0.083% IN NEBU: 2.5000 mg | INHALATION_SOLUTION | RESPIRATORY_TRACT | Status: DC | PRN

## 2024-07-17 MED ORDER — ONDANSETRON HCL 4 MG/2ML IJ SOLN: 4.0000 mg | Freq: Four times a day (QID) | INTRAMUSCULAR | Status: DC | PRN

## 2024-07-17 MED ORDER — ONDANSETRON HCL 4 MG PO TABS: 4.0000 mg | ORAL_TABLET | Freq: Four times a day (QID) | ORAL | Status: DC | PRN

## 2024-07-17 MED ORDER — ACETAMINOPHEN 325 MG PO TABS
650.0000 mg | ORAL_TABLET | Freq: Four times a day (QID) | ORAL | Status: DC | PRN
  Administered 2024-07-18 – 2024-07-23 (×5): 650 mg via ORAL
  Filled 2024-07-17 (×5): qty 2

## 2024-07-17 NOTE — ED Triage Notes (Signed)
 Bib [facility] from home s/p unwitnessed fall. Patient at baseline orientation level according to EMS per family. Patient denies pain at this time. No evidence of injuries or head trauma per EMS. Takes Eliquis .

## 2024-07-17 NOTE — H&P (Incomplete)
 " History and Physical    Debbie Cardenas FMW:[id number] DOB: [birth year] DOA: 07/17/2024  PCP: Juliane Che, PA  Patient coming from: home   I have personally briefly reviewed patient's old medical records in [facility]  Chief Complaint: change in mental status  HPI: Debbie Cardenas is a 78 y.o. female with medical history significant of Anxiety, HLD, HTN,  Recent Hx of progressive cognitive decline w/o formal diagnosis of dementia.  Patient is brought in by family as patient now with  acute progression of  cognitive decline over the last few days with  associated hallucinations. Per chart  patient intermittently not able to recognize family members which is not her baseline. Patient currently is not able to give history and does appear altered.  Per RN patient with continued hallucinations as well.   ED Course:  Vitals:afeb, bp :205/60 repeat 180/104 hr 57, rr16, sat 100%  EKG: sinus bradycardia , nonspecific interventricular conduction delay   Wbc :5.2,  hgb 12.4, plt 215  Na 141, K 3.7, CL 105 glu93  Mag 2.1 RVP: neg  UA: rare bacteria , + hematuria ,  CTH: NAD CT Lumbar spine:IMPRESSION: 1. No acute findings. 2. Remote superior endplate fracture at T12.   Review of Systems: As per HPI otherwise 10 point review of systems negative.   Past Medical History:  Diagnosis Date   Anxiety    Arthritis    knees   Closed fracture of neck of left femur (HCC) 08/15/2018   Coronary artery disease    Hyperlipidemia    Hypertension    Pneumonia     Past Surgical History:  Procedure Laterality Date   APPENDECTOMY     CARDIAC CATHETERIZATION     CATARACT EXTRACTION, BILATERAL     CHOLECYSTECTOMY     CORONARY ARTERY BYPASS GRAFT N/A 08/15/2020   Procedure: CORONARY ARTERY BYPASS GRAFTING (CABG)X4. USING BILATERAL MAMMARY ARTERIES AND RIGHT GREATER SAPHENOUS VEIN HARVESTED ENDOSCOPICALLY.;  Surgeon: Lucas Dorise POUR, MD;  Location: [facility] OR;  Service: Open Heart Surgery;   Laterality: N/A;   EYE SURGERY     bilateral cataract removal   KNEE SURGERY     LEFT HEART CATH AND CORONARY ANGIOGRAPHY N/A 07/27/2020   Procedure: LEFT HEART CATH AND CORONARY ANGIOGRAPHY;  Surgeon: Jordan, Peter M, MD;  Location: [facility] INVASIVE CV LAB;  Service: Cardiovascular;  Laterality: N/A;   TEE WITHOUT CARDIOVERSION N/A 08/15/2020   Procedure: TRANSESOPHAGEAL ECHOCARDIOGRAM (TEE);  Surgeon: Lucas Dorise POUR, MD;  Location: [facility] OR;  Service: Open Heart Surgery;  Laterality: N/A;   TOTAL HIP ARTHROPLASTY Left 08/15/2018   Procedure: TOTAL HIP ARTHROPLASTY ANTERIOR APPROACH;  Surgeon: Vernetta Lonni GRADE, MD;  Location: [facility] ORS;  Service: Orthopedics;  Laterality: Left;   TOTAL KNEE ARTHROPLASTY Right 10/02/2022   Procedure: RIGHT TOTAL KNEE ARTHROPLASTY;  Surgeon: Vernetta Lonni GRADE, MD;  Location: [facility] OR;  Service: Orthopedics;  Laterality: Right;     reports that she quit smoking about 10 years ago. Her smoking use included cigarettes. She has never used smokeless tobacco. She reports current alcohol use. She reports that she does not use drugs.  Allergies[1]  Family History  Problem Relation Age of Onset   Cancer Sister    Heart disease Sister    Heart attack Brother    Heart disease Brother        s/p CABG   Heart attack Father    Heart disease Sister    Heart attack Brother    Heart  disease Brother     Prior to Admission medications  Medication Sig Start Date End Date Taking? Authorizing Provider  acetaminophen  (TYLENOL ) 325 MG tablet Take 2 tablets (650 mg total) by mouth every 6 (six) hours as needed. 10/11/22   Gonfa, Taye T, MD  amLODipine  (NORVASC ) 5 MG tablet TAKE 1 TABLET (5 MG TOTAL) BY MOUTH DAILY. 01/22/24   Jordan, Peter M, MD  apixaban  (ELIQUIS ) 5 MG TABS tablet Take 1 tablet (5 mg total) by mouth 2 (two) times daily. 08/21/23   Jordan, Peter M, MD  atorvastatin  (LIPITOR) 10 MG tablet TAKE 1 TABLET BY MOUTH EVERY DAY 03/11/23   Emelia Josefa HERO, NP   Cholecalciferol  (VITAMIN D ) 50 MCG (2000 UT) tablet Take 2,000 Units by mouth daily.    [provider]  cyanocobalamin  1000 MCG tablet Take 1 tablet (1,000 mcg total) by mouth daily. 10/13/22   Gonfa, Taye T, MD  ezetimibe  (ZETIA ) 10 MG tablet Take 1 tablet (10 mg total) by mouth daily. Pt must schedule a followup appt with Cardiology in January 2026 for any more refills. [phone] Thank You 07/10/24   Jordan, Peter M, MD  metoprolol  succinate (TOPROL -XL) 25 MG 24 hr tablet TAKE 0.5 TABLETS (12.5 MG TOTAL) BY MOUTH DAILY. TAKE BETWEEN LUNCH AND DINNER. 01/22/24   Jordan, Peter M, MD  SODIUM FLUORIDE 5000 PPM 1.1 % PSTE Place 1 Application onto teeth at bedtime. 08/27/22   [provider]    Physical Exam: Vitals:   07/17/24 1600 07/17/24 1615 07/17/24 1730 07/17/24 2030  BP: (!) 203/60 (!) 201/55 (!) 213/75 (!) 180/104  Pulse: (!) 54 (!) 50 (!) 56 61  Resp: 15 15 14 19   Temp:      TempSrc:      SpO2: 100% 100% 100% 100%    Constitutional: NAD, restless, Vitals:   07/17/24 1600 07/17/24 1615 07/17/24 1730 07/17/24 2030  BP: (!) 203/60 (!) 201/55 (!) 213/75 (!) 180/104  Pulse: (!) 54 (!) 50 (!) 56 61  Resp: 15 15 14 19   Temp:      TempSrc:      SpO2: 100% 100% 100% 100%   Eyes: PERRL, lids and conjunctivae normal ENMT: Mucous membranes are moist. Posterior pharynx clear of any exudate or lesions.Normal dentition.  Neck: normal, supple, no masses, no thyromegaly Respiratory: clear to auscultation bilaterally, no wheezing, no crackles. Normal respiratory effort. No accessory muscle use.  Cardiovascular: Regular rate and rhythm, no murmurs / rubs / gallops. No extremity edema. 2+ pedal pulses. .  Abdomen: no tenderness, no masses palpated. No hepatosplenomegaly. Bowel sounds positive.  Musculoskeletal: no clubbing / cyanosis. No joint deformity upper and lower extremities. Good ROM, no contractures. Normal muscle tone.  Skin: no rashes, lesions, ulcers. No  induration Neurologic: CN 2-12 grossly intact. Sensation intact, Strength 5/5 in all 4.  Psychiatric: restless, agitated  Labs on Admission: I have personally reviewed following labs and imaging studies  CBC: Recent Labs  Lab 07/17/24 1719  WBC 5.2  NEUTROABS 2.9  HGB 12.4  HCT 37.1  MCV 93.7  PLT 215   Basic Metabolic Panel: Recent Labs  Lab 07/17/24 1719  NA 141  K 3.7  CL 105  CO2 27  GLUCOSE 93  BUN 23  CREATININE 0.88  CALCIUM  9.7  MG 2.1   GFR: CrCl cannot be calculated (Unknown ideal weight.). Liver Function Tests: Recent Labs  Lab 07/17/24 1719  AST 22  ALT 11  ALKPHOS 67  BILITOT 0.7  PROT  6.6  ALBUMIN  4.2   No results for input(s): LIPASE, AMYLASE in the last 168 hours. No results for input(s): AMMONIA in the last 168 hours. Coagulation Profile: No results for input(s): INR, PROTIME in the last 168 hours. Cardiac Enzymes: No results for input(s): CKTOTAL, CKMB, CKMBINDEX, TROPONINI in the last 168 hours. BNP (last 3 results) No results for input(s): PROBNP in the last 8760 hours. HbA1C: No results for input(s): HGBA1C in the last 72 hours. CBG: Recent Labs  Lab 07/17/24 1625  GLUCAP 93   Lipid Profile: No results for input(s): CHOL, HDL, LDLCALC, TRIG, CHOLHDL, LDLDIRECT in the last 72 hours. Thyroid  Function Tests: No results for input(s): TSH, T4TOTAL, FREET4, T3FREE, THYROIDAB in the last 72 hours. Anemia Panel: No results for input(s): VITAMINB12, FOLATE, FERRITIN, TIBC, IRON, RETICCTPCT in the last 72 hours. Urine analysis:    Component Value Date/Time   COLORURINE YELLOW 07/17/2024 1820   APPEARANCEUR CLOUDY (A) 07/17/2024 1820   LABSPEC 1.015 07/17/2024 1820   PHURINE 7.0 07/17/2024 1820   GLUCOSEU NEGATIVE 07/17/2024 1820   HGBUR LARGE (A) 07/17/2024 1820   BILIRUBINUR NEGATIVE 07/17/2024 1820   KETONESUR NEGATIVE 07/17/2024 1820   PROTEINUR NEGATIVE 07/17/2024 1820    NITRITE NEGATIVE 07/17/2024 1820   LEUKOCYTESUR SMALL (A) 07/17/2024 1820    Radiological Exams on Admission: DG Chest Portable 1 View Result Date: 07/17/2024 EXAM: 1 VIEW(S) XRAY OF THE CHEST 07/17/2024 07:55:00 PM COMPARISON: 10/09/2022. CLINICAL HISTORY: falls, AMS FINDINGS: LUNGS AND PLEURA: No focal pulmonary opacity. No pleural effusion. No pneumothorax. HEART AND MEDIASTINUM: Surgical changes in mediastinum. Aortic calcification. BONES AND SOFT TISSUES: Intact sternotomy wires. No acute osseous abnormality. IMPRESSION: 1. No acute process. Electronically signed by: Norman Gatlin MD 07/17/2024 07:59 PM EST RP Workstation: [id number]   CT Thoracic Spine Wo Contrast Result Date: 07/17/2024 EXAM: CT THORACIC SPINE WITHOUT CONTRAST 07/17/2024 06:57:00 PM TECHNIQUE: CT of the thoracic spine was performed without the administration of intravenous contrast. Multiplanar reformatted images are provided for review. Automated exposure control, iterative reconstruction, and/or weight based adjustment of the mA/kV was utilized to reduce the radiation dose to as low as reasonably achievable. COMPARISON: None available. CLINICAL HISTORY: Ataxia, thoracic trauma FINDINGS: BONES AND ALIGNMENT: Normal vertebral body heights. Remote superior endplate fracture is present at T12. No acute fracture or suspicious bone lesion. Normal alignment. DEGENERATIVE CHANGES: No significant degenerative changes. SOFT TISSUES: No acute abnormality. VASCULATURE: Atherosclerotic changes are present in the aorta and branch vessels. No aneurysm is present. IMPRESSION: 1. No acute abnormality of the thoracic spine. 2. Remote superior endplate fracture at T12. Electronically signed by: Lonni Necessary MD 07/17/2024 07:38 PM EST RP Workstation: [id number]   CT Cervical Spine Wo Contrast Result Date: 07/17/2024 EXAM: CT CERVICAL SPINE WITHOUT CONTRAST 07/17/2024 06:57:00 PM TECHNIQUE: CT of the cervical spine was performed  without the administration of intravenous contrast. Multiplanar reformatted images are provided for review. Automated exposure control, iterative reconstruction, and/or weight based adjustment of the mA/kV was utilized to reduce the radiation dose to as low as reasonably achievable. COMPARISON: None available. CLINICAL HISTORY: Neck trauma (Age >= 65y) FINDINGS: BONES AND ALIGNMENT: No acute fracture or traumatic malalignment. DEGENERATIVE CHANGES: Grade 1 degenerative anterolisthesis is present at C3-C4 and C4-C5. Facet hypertrophy contributes to foraminal narrowing bilaterally at C3-C4 and C4-C5. Uncovertebral spurring contributes to foraminal narrowing bilaterally at C5-C6 and C6-C7. SOFT TISSUES: No prevertebral soft tissue swelling. IMPRESSION: 1. No evidence of acute traumatic injury. Electronically signed by: Lonni Necessary MD 07/17/2024 07:31 PM  EST RP Workstation: [id number]   CT Lumbar Spine Wo Contrast Result Date: 07/17/2024 EXAM: CT OF THE LUMBAR SPINE WITHOUT CONTRAST 07/17/2024 06:57:00 PM TECHNIQUE: CT of the lumbar spine was performed without the administration of intravenous contrast. Multiplanar reformatted images are provided for review. Automated exposure control, iterative reconstruction, and/or weight based adjustment of the mA/kV was utilized to reduce the radiation dose to as low as reasonably achievable. COMPARISON: None available. CLINICAL HISTORY: Back trauma, no prior imaging (Age >= 16y). Unwitnessed fall. FINDINGS: BONES AND ALIGNMENT: Normal vertebral body heights. A superior endplate fracture at T12 appears remote. No acute fracture or suspicious bone lesion. Normal alignment. DEGENERATIVE CHANGES: Moderate central canal stenosis is present at C2-C3, C3-C4, and C4-C5 secondary to broad-based disc protrusions and ligamentum flavum thickening. Moderate foraminal narrowing is present on the left at L4-L5. SOFT TISSUES: Atherosclerotic calcifications are present in the aorta and  branch vessels without aneurysm. No acute abnormality. IMPRESSION: 1. No acute findings. 2. Remote superior endplate fracture at T12. Electronically signed by: Lonni Necessary MD 07/17/2024 07:26 PM EST RP Workstation: [id number]   CT Head Wo Contrast Result Date: 07/17/2024 EXAM: CT HEAD WITHOUT CONTRAST 07/17/2024 06:57:00 PM TECHNIQUE: CT of the head was performed without the administration of intravenous contrast. Automated exposure control, iterative reconstruction, and/or weight based adjustment of the mA/kV was utilized to reduce the radiation dose to as low as reasonably achievable. COMPARISON: None available. CLINICAL HISTORY: Head trauma, minor (Age >= 65y). Unwitnessed fall. FINDINGS: BRAIN AND VENTRICLES: No acute hemorrhage. No evidence of acute infarct. No hydrocephalus. No extra-axial collection. No mass effect or midline shift. Atherosclerotic calcifications within the cavernous internal carotid arteries. ORBITS: Bilateral lens replacement. No acute abnormality. SINUSES: No acute abnormality. SOFT TISSUES AND SKULL: No acute soft tissue abnormality. No skull fracture. IMPRESSION: 1. No acute intracranial abnormality. Electronically signed by: Lonni Necessary MD 07/17/2024 07:24 PM EST RP Workstation: [id number]    EKG: Independently reviewed. See above   Assessment/Plan  Acute metabolic encephalopathy  -possible  related to medication side -effect vs uncontrolled htn vs possible infection in back ground of progressive dementia - will f/u with MRI to be complete  -neuro checks  - to be complete f/u with vbg/ ammonia/ tsh/ utox   Agitation -trial of seroqeul -haldol  prn  -consider psych consult if above note effective   Abnormal UA  - f/u with culture data  -de-escalate abx as able  - CT stone protocol  -Ctx in setting of change in mental status  Hypertensive Urgency  -admit to progressive care  - CTH negative  - resume home regimen (amlodipine  , metoprolol   - iv  prn hydralazine   -if above not effective consider cardene drip -echo in am    CAD s/P CABG ( 2022) -continue statin, betablocker , apixaban   Atrial fibrillation  -metoprolol  succinate 12.5 mg daily-  -continue  apixaban    Anxiety  -was previously on ssri  -not currently noted on med rec   HLD -continue statin   Carotid Artery stenosis  -continue statin   Cognitive decline /Mild Cognitive Decline  -per pcp note work up was negative for Alzheimer's dementia  -will need further evaluation once delirium clears      DVT prophylaxis:  Eliquis  Code Status: full/ as discussed per patient wishes in event of cardiac arrest  Family Communication: none Disposition Plan: patient  expected to be admitted greater than 2 midnights ) Consults called: n/a Admission status: progressive care    Camila DELENA Ned MD [facility]   If 7PM-7AM, please contact night-coverage [email or web address] Password [email or web address]  07/17/2024, 9:15 PM        [1]  Allergies Allergen Reactions   Tramadol  Itching    Pt itches the day after taking but can tolerate it   Ephedrine      My dentist told me to never let anyone give me this   Ace Inhibitors Cough   "

## 2024-07-17 NOTE — ED Provider Notes (Signed)
 " [place] [facility] AT [facility] Provider Note   CSN: [id number] Arrival date & time: 07/17/24  1530     History  Chief Complaint  Patient presents with   Debbie Cardenas is a 78 y.o. female with CAD s/p CABG, Afib on eliquis , HTN, HLD, who presents BIB [facility] from home s/p unwitnessed fall. Patient at baseline orientation level according to EMS per family. Patient denies pain at this time. No evidence of injuries or head trauma per EMS. Takes Eliquis .   Patient's husband and son at bedside give history. Level 5 caveat d/t AMS. They state that she has had some dementia findings over the last months but particular accelerating over the last 3-4 weeks and then especially in the last one week. Over the last few days she has fallen 3 times, several unwitnessed, unsure if she hit her head, didn't lose consciousness as far as they are aware. She has been hallucinating as well, seeing things that aren't there, like another dog in the house or children in the house.   Patient denies pain currently but she has complained to her family of headaches, popping in her head and neck, and pain in tailbone and back. Has appt with neurologist but it is 08/26/23.    Past Medical History:  Diagnosis Date   Anxiety    Arthritis    knees   Closed fracture of neck of left femur (HCC) 08/15/2018   Coronary artery disease    Hyperlipidemia    Hypertension    Pneumonia        Home Medications Prior to Admission medications  Medication Sig Start Date End Date Taking? Authorizing Provider  acetaminophen  (TYLENOL ) 325 MG tablet Take 2 tablets (650 mg total) by mouth every 6 (six) hours as needed. 10/11/22   Gonfa, Taye T, MD  amLODipine  (NORVASC ) 5 MG tablet TAKE 1 TABLET (5 MG TOTAL) BY MOUTH DAILY. 01/22/24   Jordan, Peter M, MD  apixaban  (ELIQUIS ) 5 MG TABS tablet Take 1 tablet (5 mg total) by mouth 2 (two) times daily. 08/21/23   Jordan, Peter M, MD  atorvastatin   (LIPITOR) 10 MG tablet TAKE 1 TABLET BY MOUTH EVERY DAY 03/11/23   Emelia Josefa HERO, NP  Cholecalciferol  (VITAMIN D ) 50 MCG (2000 UT) tablet Take 2,000 Units by mouth daily.    [provider]  cyanocobalamin  1000 MCG tablet Take 1 tablet (1,000 mcg total) by mouth daily. 10/13/22   Gonfa, Taye T, MD  ezetimibe  (ZETIA ) 10 MG tablet Take 1 tablet (10 mg total) by mouth daily. Pt must schedule a followup appt with Cardiology in January 2026 for any more refills. [phone] Thank You 07/10/24   Jordan, Peter M, MD  metoprolol  succinate (TOPROL -XL) 25 MG 24 hr tablet TAKE 0.5 TABLETS (12.5 MG TOTAL) BY MOUTH DAILY. TAKE BETWEEN LUNCH AND DINNER. 01/22/24   Jordan, Peter M, MD  SODIUM FLUORIDE 5000 PPM 1.1 % PSTE Place 1 Application onto teeth at bedtime. 08/27/22   [provider]      Allergies    Tramadol , Ephedrine , and Ace inhibitors    Review of Systems   Review of Systems A 10 point review of systems was performed and is negative unless otherwise reported in HPI.  Physical Exam Updated Vital Signs BP (!) 180/104   Pulse 61   Temp 98.3 F (36.8 C) (Oral)   Resp 19   SpO2 100%  Physical Exam General: Normal appearing elderly female, lying  in bed.  HEENT: PERRLA, EOMI, no nystagmus, Sclera anicteric, MMM, trachea midline.  Cardiology: RRR, no murmurs/rubs/gallops.  Resp: Normal respiratory rate and effort. CTAB, no wheezes, rhonchi, crackles.  Abd: Soft, non-tender, non-distended. No rebound tenderness or guarding.  GU: Deferred. MSK: No peripheral edema or signs of trauma. Extremities without deformity or TTP. No cyanosis or clubbing. Skin: warm, dry. No rashes or lesions. Back: No CVA tenderness. No midline C T or L spine TTP deformities or stepoffs.  Neuro: A&Ox1, unable to answer any questions, rambling speech, doesn't recognize her family, CNs II-XII grossly intact. 5/5 strength in all extremities. Sensation grossly intact. No dysarthria.  Psych: Pleasant, not  responding to internal stimuli  ED Results / Procedures / Treatments   Labs (all labs ordered are listed, but only abnormal results are displayed) Labs Reviewed  URINALYSIS, W/ REFLEX TO CULTURE (INFECTION SUSPECTED) - Abnormal; Notable for the following components:      Result Value   APPearance CLOUDY (*)    Hgb urine dipstick LARGE (*)    Leukocytes,Ua SMALL (*)    Bacteria, UA RARE (*)    All other components within normal limits  RESP PANEL BY RT-PCR (RSV, FLU A&B, COVID)  [id number]  URINE CULTURE  CBC WITH DIFFERENTIAL/PLATELET  COMPREHENSIVE METABOLIC PANEL WITH GFR  MAGNESIUM   CBG MONITORING, ED    EKG EKG Interpretation Date/Time:  Friday July 17 2024 16:20:37 EST Ventricular Rate:  51 PR Interval:    QRS Duration:  115 QT Interval:  471 QTC Calculation: 434 R Axis:   53  Text Interpretation: Sinus bradycardia Nonspecific interventricular conduction delay Similar to prior EKG Confirmed by Franklyn Gills [phone]) on 07/17/2024 6:33:20 PM  Radiology DG Chest Portable 1 View Result Date: 07/17/2024 EXAM: 1 VIEW(S) XRAY OF THE CHEST 07/17/2024 07:55:00 PM COMPARISON: 10/09/2022. CLINICAL HISTORY: falls, AMS FINDINGS: LUNGS AND PLEURA: No focal pulmonary opacity. No pleural effusion. No pneumothorax. HEART AND MEDIASTINUM: Surgical changes in mediastinum. Aortic calcification. BONES AND SOFT TISSUES: Intact sternotomy wires. No acute osseous abnormality. IMPRESSION: 1. No acute process. Electronically signed by: Norman Gatlin MD 07/17/2024 07:59 PM EST RP Workstation: [id number]   CT Thoracic Spine Wo Contrast Result Date: 07/17/2024 EXAM: CT THORACIC SPINE WITHOUT CONTRAST 07/17/2024 06:57:00 PM TECHNIQUE: CT of the thoracic spine was performed without the administration of intravenous contrast. Multiplanar reformatted images are provided for review. Automated exposure control, iterative reconstruction, and/or weight based adjustment of the mA/kV was utilized to reduce  the radiation dose to as low as reasonably achievable. COMPARISON: None available. CLINICAL HISTORY: Ataxia, thoracic trauma FINDINGS: BONES AND ALIGNMENT: Normal vertebral body heights. Remote superior endplate fracture is present at T12. No acute fracture or suspicious bone lesion. Normal alignment. DEGENERATIVE CHANGES: No significant degenerative changes. SOFT TISSUES: No acute abnormality. VASCULATURE: Atherosclerotic changes are present in the aorta and branch vessels. No aneurysm is present. IMPRESSION: 1. No acute abnormality of the thoracic spine. 2. Remote superior endplate fracture at T12. Electronically signed by: Lonni Necessary MD 07/17/2024 07:38 PM EST RP Workstation: [id number]   CT Cervical Spine Wo Contrast Result Date: 07/17/2024 EXAM: CT CERVICAL SPINE WITHOUT CONTRAST 07/17/2024 06:57:00 PM TECHNIQUE: CT of the cervical spine was performed without the administration of intravenous contrast. Multiplanar reformatted images are provided for review. Automated exposure control, iterative reconstruction, and/or weight based adjustment of the mA/kV was utilized to reduce the radiation dose to as low as reasonably achievable. COMPARISON: None available. CLINICAL HISTORY: Neck trauma (Age >= 65y) FINDINGS: BONES AND ALIGNMENT:  No acute fracture or traumatic malalignment. DEGENERATIVE CHANGES: Grade 1 degenerative anterolisthesis is present at C3-C4 and C4-C5. Facet hypertrophy contributes to foraminal narrowing bilaterally at C3-C4 and C4-C5. Uncovertebral spurring contributes to foraminal narrowing bilaterally at C5-C6 and C6-C7. SOFT TISSUES: No prevertebral soft tissue swelling. IMPRESSION: 1. No evidence of acute traumatic injury. Electronically signed by: Lonni Necessary MD 07/17/2024 07:31 PM EST RP Workstation: [id number]   CT Lumbar Spine Wo Contrast Result Date: 07/17/2024 EXAM: CT OF THE LUMBAR SPINE WITHOUT CONTRAST 07/17/2024 06:57:00 PM TECHNIQUE: CT of the lumbar spine was  performed without the administration of intravenous contrast. Multiplanar reformatted images are provided for review. Automated exposure control, iterative reconstruction, and/or weight based adjustment of the mA/kV was utilized to reduce the radiation dose to as low as reasonably achievable. COMPARISON: None available. CLINICAL HISTORY: Back trauma, no prior imaging (Age >= 16y). Unwitnessed fall. FINDINGS: BONES AND ALIGNMENT: Normal vertebral body heights. A superior endplate fracture at T12 appears remote. No acute fracture or suspicious bone lesion. Normal alignment. DEGENERATIVE CHANGES: Moderate central canal stenosis is present at C2-C3, C3-C4, and C4-C5 secondary to broad-based disc protrusions and ligamentum flavum thickening. Moderate foraminal narrowing is present on the left at L4-L5. SOFT TISSUES: Atherosclerotic calcifications are present in the aorta and branch vessels without aneurysm. No acute abnormality. IMPRESSION: 1. No acute findings. 2. Remote superior endplate fracture at T12. Electronically signed by: Lonni Necessary MD 07/17/2024 07:26 PM EST RP Workstation: [id number]   CT Head Wo Contrast Result Date: 07/17/2024 EXAM: CT HEAD WITHOUT CONTRAST 07/17/2024 06:57:00 PM TECHNIQUE: CT of the head was performed without the administration of intravenous contrast. Automated exposure control, iterative reconstruction, and/or weight based adjustment of the mA/kV was utilized to reduce the radiation dose to as low as reasonably achievable. COMPARISON: None available. CLINICAL HISTORY: Head trauma, minor (Age >= 65y). Unwitnessed fall. FINDINGS: BRAIN AND VENTRICLES: No acute hemorrhage. No evidence of acute infarct. No hydrocephalus. No extra-axial collection. No mass effect or midline shift. Atherosclerotic calcifications within the cavernous internal carotid arteries. ORBITS: Bilateral lens replacement. No acute abnormality. SINUSES: No acute abnormality. SOFT TISSUES AND SKULL: No acute  soft tissue abnormality. No skull fracture. IMPRESSION: 1. No acute intracranial abnormality. Electronically signed by: Lonni Necessary MD 07/17/2024 07:24 PM EST RP Workstation: [id number]    Procedures Procedures    Medications Ordered in ED Medications  cefTRIAXone  (ROCEPHIN ) 1 g in sodium chloride  0.9 % 100 mL IVPB (has no administration in time range)  amLODipine  (NORVASC ) tablet 5 mg (has no administration in time range)    ED Course/ Medical Decision Making/ A&P                          Medical Decision Making Amount and/or Complexity of Data Reviewed Labs: ordered. Decision-making details documented in ED Course. Radiology: ordered. Decision-making details documented in ED Course.  Risk Prescription drug management. Decision regarding hospitalization.    This patient presents to the ED for concern of Ams, worsening hallucinations and confusion, this involves an extensive number of treatment options, and is a complaint that carries with it a high risk of complications and morbidity.  I considered the following differential and admission for this acute, potentially life threatening condition. Hypertensive on arrival, patient with no acute complaints, well-appearing. A&Ox1.  MDM:    Ddx of acute altered mental status or encephalopathy considered but not limited to: -Infection such as UTI, PNA - patient w/ possible e/o UTI on UA. Culture  drawn. Will give IV ceftriaxone . Could be worsening her mental status. No signs of sepsis.  -Toxic ingestion or polypharmacy - doesn't take any sedating medications or anticholinergics -No specific electrolyte abnormalities or hyper/hypoglycemia -No SOB/ hypoxia -No CP/SOB or EKG abnormalities to indicate ACS or arrhythmia -Consider underlying dementia, worsening of chronci dementia - has f/u with neurology planned but not til late January 2026 -W/ reported fall and AMS with back pain reported, though no midline TTP on exam, obtained  head/spine imaging which was reassuringly neg just a remote T12 fx -She was hypertensive >200 mmHg on arrival. Decreased to 180/104 without treatment. No elevated Cr, no CP/SOB, no FNDs to indicate hypertensive emergency or CVA.   Clinical Course as of 07/17/24 2148  Fri Jul 17, 2024  2028 CT Thoracic Spine Wo Contrast 1. No acute abnormality of the thoracic spine. 2. Remote superior endplate fracture at T12.   [HN]  2028 CT Cervical Spine Wo Contrast 1. No evidence of acute traumatic injury. [HN]  2028 CXR clear [HN]  2028 CT Head Wo Contrast 1. No acute intracranial abnormality. [HN]  2028 Resp panel by RT-PCR (RSV, Flu A&B, Covid) Anterior Nasal Swab neg [HN]  2104 BP(!): 180/104 [HN]    Clinical Course User Index [HN] Franklyn Sid SAILOR, MD    Labs: I Ordered, and personally interpreted labs.  The pertinent results include:  those listed above  Imaging Studies ordered: I ordered imaging studies including Cthead and spine I independently visualized and interpreted imaging. I agree with the radiologist interpretation  Additional history obtained from chart review, husband/son at bedside.    Cardiac Monitoring: The patient was maintained on a cardiac monitor.  I personally viewed and interpreted the cardiac monitored which showed an underlying rhythm of: sinus brady and NSR  Reevaluation: After the interventions noted above, I reevaluated the patient and found that they have :stayed the same  Social Determinants of Health: Lives with husband  Disposition:  Admit to hospitalist  Co morbidities that complicate the patient evaluation  Past Medical History:  Diagnosis Date   Anxiety    Arthritis    knees   Closed fracture of neck of left femur (HCC) 08/15/2018   Coronary artery disease    Hyperlipidemia    Hypertension    Pneumonia      Medicines Meds ordered this encounter  Medications   cefTRIAXone  (ROCEPHIN ) 1 g in sodium chloride  0.9 % 100 mL IVPB     Antibiotic Indication::   UTI   DISCONTD: hydrALAZINE  (APRESOLINE ) injection 5 mg   amLODipine  (NORVASC ) tablet 5 mg    I have reviewed the patients home medicines and have made adjustments as needed  Problem List / ED Course: Problem List Items Addressed This Visit       Other   * (Principal) Change in mental status - Primary   Other Visit Diagnoses       Fall in home, initial encounter         Visual hallucinations         Acute cystitis with hematuria                       This note was created using dictation software, which may contain spelling or grammatical errors.    Franklyn Sid SAILOR, MD 07/17/24 2152  "

## 2024-07-18 ENCOUNTER — Inpatient Hospital Stay (HOSPITAL_COMMUNITY)

## 2024-07-18 ENCOUNTER — Encounter (HOSPITAL_COMMUNITY): Payer: Self-pay | Admitting: Internal Medicine

## 2024-07-18 DIAGNOSIS — G9341 Metabolic encephalopathy: Secondary | ICD-10-CM

## 2024-07-18 LAB — COMPREHENSIVE METABOLIC PANEL WITH GFR
ALT: 11 U/L (ref 0–44)
AST: 20 U/L (ref 15–41)
Albumin: 4 g/dL (ref 3.5–5.0)
Alkaline Phosphatase: 61 U/L (ref 38–126)
Anion gap: 9 (ref 5–15)
BUN: 17 mg/dL (ref 8–23)
CO2: 26 mmol/L (ref 22–32)
Calcium: 9.4 mg/dL (ref 8.9–10.3)
Chloride: 107 mmol/L (ref 98–111)
Creatinine, Ser: 0.77 mg/dL (ref 0.44–1.00)
GFR, Estimated: >60 mL/min
Glucose, Bld: 100 mg/dL — ABNORMAL HIGH (ref 70–99)
Potassium: 3.4 mmol/L — ABNORMAL LOW (ref 3.5–5.1)
Sodium: 141 mmol/L (ref 135–145)
Total Bilirubin: 0.6 mg/dL (ref 0.0–1.2)
Total Protein: 6.3 g/dL — ABNORMAL LOW (ref 6.5–8.1)

## 2024-07-18 LAB — CBC
HCT: 34.8 % — ABNORMAL LOW (ref 36.0–46.0)
Hemoglobin: 12 g/dL (ref 12.0–15.0)
MCH: 31.3 pg (ref 26.0–34.0)
MCHC: 34.5 g/dL (ref 30.0–36.0)
MCV: 90.9 fL (ref 80.0–100.0)
Platelets: 194 K/uL (ref 150–400)
RBC: 3.83 MIL/uL — ABNORMAL LOW (ref 3.87–5.11)
RDW: 13 % (ref 11.5–15.5)
WBC: 4.9 K/uL (ref 4.0–10.5)
nRBC: 0 % (ref 0.0–0.2)

## 2024-07-18 LAB — TSH: TSH: 0.396 u[IU]/mL (ref 0.350–4.500)

## 2024-07-18 LAB — HEMOGLOBIN A1C
Hgb A1c MFr Bld: 4.8 % (ref 4.8–5.6)
Mean Plasma Glucose: 91.06 mg/dL

## 2024-07-18 LAB — TROPONIN T, HIGH SENSITIVITY: Troponin T High Sensitivity: 19 ng/L (ref 0–19)

## 2024-07-18 MED ORDER — AMLODIPINE BESYLATE 5 MG PO TABS: 5.0000 mg | ORAL_TABLET | Freq: Once | ORAL | Status: DC

## 2024-07-18 MED ORDER — MELATONIN 3 MG PO TABS
3.0000 mg | ORAL_TABLET | Freq: Once | ORAL | Status: DC
  Filled 2024-07-18: qty 1

## 2024-07-18 MED ORDER — HALOPERIDOL LACTATE 5 MG/ML IJ SOLN
5.0000 mg | Freq: Four times a day (QID) | INTRAMUSCULAR | Status: DC | PRN
  Administered 2024-07-18 – 2024-07-21 (×3): 5 mg via INTRAVENOUS
  Filled 2024-07-18 (×4): qty 1

## 2024-07-18 MED ORDER — ZIPRASIDONE MESYLATE 20 MG IM SOLR
10.0000 mg | Freq: Once | INTRAMUSCULAR | Status: AC
  Administered 2024-07-18: 10 mg via INTRAMUSCULAR
  Filled 2024-07-18: qty 20

## 2024-07-18 MED ORDER — SODIUM CHLORIDE 0.9 % IV SOLN
1.0000 g | INTRAVENOUS | Status: AC
  Administered 2024-07-18 – 2024-07-19 (×2): 1 g via INTRAVENOUS
  Filled 2024-07-18 (×2): qty 10

## 2024-07-18 MED ORDER — ZIPRASIDONE MESYLATE 20 MG IM SOLR
20.0000 mg | Freq: Once | INTRAMUSCULAR | Status: DC | PRN
  Filled 2024-07-18 (×2): qty 20

## 2024-07-18 MED ORDER — LORAZEPAM 2 MG/ML IJ SOLN
0.5000 mg | Freq: Once | INTRAMUSCULAR | Status: AC
  Administered 2024-07-18: 0.5 mg via INTRAVENOUS
  Filled 2024-07-18: qty 1

## 2024-07-18 MED ORDER — QUETIAPINE FUMARATE 25 MG PO TABS
25.0000 mg | ORAL_TABLET | Freq: Two times a day (BID) | ORAL | Status: DC
  Administered 2024-07-18 (×2): 25 mg via ORAL
  Filled 2024-07-18 (×2): qty 1

## 2024-07-18 MED ORDER — MELATONIN 3 MG PO TABS
3.0000 mg | ORAL_TABLET | Freq: Every day | ORAL | Status: DC
  Administered 2024-07-18: 3 mg via ORAL
  Filled 2024-07-18: qty 1

## 2024-07-18 MED ORDER — FENTANYL CITRATE (PF) 50 MCG/ML IJ SOSY: 25.0000 ug | PREFILLED_SYRINGE | Freq: Once | INTRAMUSCULAR | Status: DC | PRN

## 2024-07-18 MED ORDER — HALOPERIDOL LACTATE 5 MG/ML IJ SOLN
2.0000 mg | Freq: Four times a day (QID) | INTRAMUSCULAR | Status: DC | PRN
  Administered 2024-07-18 (×2): 2 mg via INTRAVENOUS
  Filled 2024-07-18 (×2): qty 1

## 2024-07-18 NOTE — ED Notes (Signed)
 Pt otf to MRI care will resume on return.

## 2024-07-18 NOTE — Progress Notes (Signed)
 " PROGRESS NOTE    Debbie Cardenas  FMW:[id number] DOB: Dec 14, 1945 DOA: 07/17/2024 PCP: Debbie Che, PA  Outpatient Specialists:     Brief Narrative:  Patient is a 78 year old female past medical history significant for likely previously undiagnosed dementia, closed fracture of neck of left femur, coronary artery disease, hyperlipidemia, hypertension and pneumonia.  Patient was admitted with recurrent falls (according to the husband).  Collateral information reveals progressive cognitive decline, with associated hallucinations.  Blood pressure of 158-213/60-75 mmHg was documented on presentation.  Potassium of 3.4, serum creatinine of 0.77, negative influenza and COVID testing noted.  CT abdomen and pelvis without contrast revealed 9 mm nonobstructing left renal inferior pole calculus, no hydronephrosis.  Moderate colonic stool burden was also reported.  CT head without contrast, CT cervical spine without contrast, CT lumbar and thoracic without contrast were nonrevealing.  UA revealed specific gravity of 1.015, large hemoglobin, RBC of greater than 50 and small leukocyte esterase.  07/18/2024: Patient seen alongside patient's husband.  Patient was resting quietly.  Apparently, patient may have been up most of the night.  The husband only reported history of fall.       Assessment & Plan:   Principal Problem:   Change in mental status  Acute metabolic encephalopathy: - Etiology is uncertain, likely multifactorial.. - Continue workup.   - Adequate hydration. - Follow results. - Neurochecks. - Low threshold to proceed with MRI brain.     Agitation: - Possible dementia with behavioral problems. - Trial of seroqeul -haldol  prn  -consider psych consult if above note effective    Abnormal UA: - f/u with culture data  -de-escalate abx as able  - CT stone protocol revealed 9 mm kidney stone, nonobstructing. - Patient is currently on IV Rocephin .  Hypertensive Urgency: - Gradually  control. - CTH negative  - Amlodipine  5 mg p.o. once daily.  - iv prn hydralazine   -echo in am      CAD s/P CABG ( 2022) -continue statin, betablocker , apixaban    Atrial fibrillation  -metoprolol  succinate 12.5 mg daily-  -continue  apixaban     Anxiety  -was previously on ssri  -not currently noted on med rec    HLD -continue statin    Carotid Artery stenosis  -continue statin    Cognitive decline /Mild Cognitive Decline  -per pcp note work up was negative for Alzheimer's dementia  -will need further evaluation once delirium clears       DVT prophylaxis: Eliquis . Code Status: Full code. Family Communication: Husband by bedside  Disposition Plan: This will depend on hospital course.   Consultants:  None.  Procedures:  None.  Antimicrobials:  IV ceftriaxone    Subjective: No history from patient.  Objective: Vitals:   07/18/24 0333 07/18/24 0436 07/18/24 0500 07/18/24 0530  BP:  (!) 188/60 (!) 184/74 (!) 175/77  Pulse:  (!) 55 (!) 57 62  Resp:  17 20 18   Temp: 98.2 F (36.8 C)     TempSrc: Oral     SpO2:  97% 98% 100%   No intake or output data in the 24 hours ending 07/18/24 0934 There were no vitals filed for this visit.  Examination:  General exam: Appears calm and comfortable.  Patient is resting quietly. Respiratory system: Clear to auscultation.  Cardiovascular system: S1 & S2  Gastrointestinal system: Abdomen is soft and nontender.  Central nervous system: Resting quietly.   Data Reviewed: I have personally reviewed following labs and imaging studies  CBC: Recent Labs  Lab 07/17/24 1719 07/18/24 0456  WBC 5.2 4.9  NEUTROABS 2.9  --   HGB 12.4 12.0  HCT 37.1 34.8*  MCV 93.7 90.9  PLT 215 194   Basic Metabolic Panel: Recent Labs  Lab 07/17/24 1719 07/18/24 0456  NA 141 141  K 3.7 3.4*  CL 105 107  CO2 27 26  GLUCOSE 93 100*  BUN 23 17  CREATININE 0.88 0.77  CALCIUM  9.7 9.4  MG 2.1  --    GFR: CrCl cannot be  calculated (Unknown ideal weight.). Liver Function Tests: Recent Labs  Lab 07/17/24 1719 07/18/24 0456  AST 22 20  ALT 11 11  ALKPHOS 67 61  BILITOT 0.7 0.6  PROT 6.6 6.3*  ALBUMIN  4.2 4.0   No results for input(s): LIPASE, AMYLASE in the last 168 hours. Recent Labs  Lab 07/17/24 2228  AMMONIA 35   Coagulation Profile: No results for input(s): INR, PROTIME in the last 168 hours. Cardiac Enzymes: No results for input(s): CKTOTAL, CKMB, CKMBINDEX, TROPONINI in the last 168 hours. BNP (last 3 results) No results for input(s): PROBNP in the last 8760 hours. HbA1C: Recent Labs    07/17/24 2228  HGBA1C 4.8   CBG: Recent Labs  Lab 07/17/24 1625  GLUCAP 93   Lipid Profile: No results for input(s): CHOL, HDL, LDLCALC, TRIG, CHOLHDL, LDLDIRECT in the last 72 hours. Thyroid  Function Tests: Recent Labs    07/17/24 2228  TSH 0.396   Anemia Panel: No results for input(s): VITAMINB12, FOLATE, FERRITIN, TIBC, IRON, RETICCTPCT in the last 72 hours. Urine analysis:    Component Value Date/Time   COLORURINE YELLOW 07/17/2024 1820   APPEARANCEUR CLOUDY (A) 07/17/2024 1820   LABSPEC 1.015 07/17/2024 1820   PHURINE 7.0 07/17/2024 1820   GLUCOSEU NEGATIVE 07/17/2024 1820   HGBUR LARGE (A) 07/17/2024 1820   BILIRUBINUR NEGATIVE 07/17/2024 1820   KETONESUR NEGATIVE 07/17/2024 1820   PROTEINUR NEGATIVE 07/17/2024 1820   NITRITE NEGATIVE 07/17/2024 1820   LEUKOCYTESUR SMALL (A) 07/17/2024 1820   Sepsis Labs: @LABRCNTIP (procalcitonin:4,lacticidven:4)  ) Recent Results (from the past 240 hours)  Resp panel by RT-PCR (RSV, Flu A&B, Covid) Anterior Nasal Swab     Status: None   Collection Time: 07/17/24  5:20 PM   Specimen: Anterior Nasal Swab  Result Value Ref Range Status   SARS Coronavirus 2 by RT PCR NEGATIVE NEGATIVE Final   Influenza A by PCR NEGATIVE NEGATIVE Final   Influenza B by PCR NEGATIVE NEGATIVE Final    Comment:  (NOTE) The Xpert Xpress SARS-CoV-2/FLU/RSV plus assay is intended as an aid in the diagnosis of influenza from Nasopharyngeal swab specimens and should not be used as a sole basis for treatment. Nasal washings and aspirates are unacceptable for Xpert Xpress SARS-CoV-2/FLU/RSV testing.  Fact Sheet for Patients: bloggercourse.com  Fact Sheet for Healthcare Providers: seriousbroker.it  This test is not yet approved or cleared by the [place]  FDA and has been authorized for detection and/or diagnosis of SARS-CoV-2 by FDA under an Emergency Use Authorization (EUA). This EUA will remain in effect (meaning this test can be used) for the duration of the COVID-19 declaration under Section 564(b)(1) of the Act, 21 U.S.C. section 360bbb-3(b)(1), unless the authorization is terminated or revoked.     Resp Syncytial Virus by PCR NEGATIVE NEGATIVE Final    Comment: (NOTE) Fact Sheet for Patients: bloggercourse.com  Fact Sheet for Healthcare Providers: seriousbroker.it  This test is not yet approved or cleared by the [place]  FDA and has  been authorized for detection and/or diagnosis of SARS-CoV-2 by FDA under an Emergency Use Authorization (EUA). This EUA will remain in effect (meaning this test can be used) for the duration of the COVID-19 declaration under Section 564(b)(1) of the Act, 21 U.S.C. section 360bbb-3(b)(1), unless the authorization is terminated or revoked.  Performed at [facility], [address] [place], [place] [address]          Radiology Studies: DG Chest Portable 1 View Result Date: 07/17/2024 EXAM: 1 VIEW(S) XRAY OF THE CHEST 07/17/2024 07:55:00 PM COMPARISON: 10/09/2022. CLINICAL HISTORY: falls, AMS FINDINGS: LUNGS AND PLEURA: No focal pulmonary opacity. No pleural effusion. No pneumothorax. HEART AND MEDIASTINUM: Surgical changes in  mediastinum. Aortic calcification. BONES AND SOFT TISSUES: Intact sternotomy wires. No acute osseous abnormality. IMPRESSION: 1. No acute process. Electronically signed by: Norman Gatlin MD 07/17/2024 07:59 PM EST RP Workstation: [id number]   CT Thoracic Spine Wo Contrast Result Date: 07/17/2024 EXAM: CT THORACIC SPINE WITHOUT CONTRAST 07/17/2024 06:57:00 PM TECHNIQUE: CT of the thoracic spine was performed without the administration of intravenous contrast. Multiplanar reformatted images are provided for review. Automated exposure control, iterative reconstruction, and/or weight based adjustment of the mA/kV was utilized to reduce the radiation dose to as low as reasonably achievable. COMPARISON: None available. CLINICAL HISTORY: Ataxia, thoracic trauma FINDINGS: BONES AND ALIGNMENT: Normal vertebral body heights. Remote superior endplate fracture is present at T12. No acute fracture or suspicious bone lesion. Normal alignment. DEGENERATIVE CHANGES: No significant degenerative changes. SOFT TISSUES: No acute abnormality. VASCULATURE: Atherosclerotic changes are present in the aorta and branch vessels. No aneurysm is present. IMPRESSION: 1. No acute abnormality of the thoracic spine. 2. Remote superior endplate fracture at T12. Electronically signed by: Lonni Necessary MD 07/17/2024 07:38 PM EST RP Workstation: [id number]   CT Cervical Spine Wo Contrast Result Date: 07/17/2024 EXAM: CT CERVICAL SPINE WITHOUT CONTRAST 07/17/2024 06:57:00 PM TECHNIQUE: CT of the cervical spine was performed without the administration of intravenous contrast. Multiplanar reformatted images are provided for review. Automated exposure control, iterative reconstruction, and/or weight based adjustment of the mA/kV was utilized to reduce the radiation dose to as low as reasonably achievable. COMPARISON: None available. CLINICAL HISTORY: Neck trauma (Age >= 65y) FINDINGS: BONES AND ALIGNMENT: No acute fracture or traumatic  malalignment. DEGENERATIVE CHANGES: Grade 1 degenerative anterolisthesis is present at C3-C4 and C4-C5. Facet hypertrophy contributes to foraminal narrowing bilaterally at C3-C4 and C4-C5. Uncovertebral spurring contributes to foraminal narrowing bilaterally at C5-C6 and C6-C7. SOFT TISSUES: No prevertebral soft tissue swelling. IMPRESSION: 1. No evidence of acute traumatic injury. Electronically signed by: Lonni Necessary MD 07/17/2024 07:31 PM EST RP Workstation: [id number]   CT Lumbar Spine Wo Contrast Result Date: 07/17/2024 EXAM: CT OF THE LUMBAR SPINE WITHOUT CONTRAST 07/17/2024 06:57:00 PM TECHNIQUE: CT of the lumbar spine was performed without the administration of intravenous contrast. Multiplanar reformatted images are provided for review. Automated exposure control, iterative reconstruction, and/or weight based adjustment of the mA/kV was utilized to reduce the radiation dose to as low as reasonably achievable. COMPARISON: None available. CLINICAL HISTORY: Back trauma, no prior imaging (Age >= 16y). Unwitnessed fall. FINDINGS: BONES AND ALIGNMENT: Normal vertebral body heights. A superior endplate fracture at T12 appears remote. No acute fracture or suspicious bone lesion. Normal alignment. DEGENERATIVE CHANGES: Moderate central canal stenosis is present at C2-C3, C3-C4, and C4-C5 secondary to broad-based disc protrusions and ligamentum flavum thickening. Moderate foraminal narrowing is present on the left at L4-L5. SOFT TISSUES: Atherosclerotic calcifications are present in  the aorta and branch vessels without aneurysm. No acute abnormality. IMPRESSION: 1. No acute findings. 2. Remote superior endplate fracture at T12. Electronically signed by: Lonni Necessary MD 07/17/2024 07:26 PM EST RP Workstation: [id number]   CT Head Wo Contrast Result Date: 07/17/2024 EXAM: CT HEAD WITHOUT CONTRAST 07/17/2024 06:57:00 PM TECHNIQUE: CT of the head was performed without the administration of  intravenous contrast. Automated exposure control, iterative reconstruction, and/or weight based adjustment of the mA/kV was utilized to reduce the radiation dose to as low as reasonably achievable. COMPARISON: None available. CLINICAL HISTORY: Head trauma, minor (Age >= 65y). Unwitnessed fall. FINDINGS: BRAIN AND VENTRICLES: No acute hemorrhage. No evidence of acute infarct. No hydrocephalus. No extra-axial collection. No mass effect or midline shift. Atherosclerotic calcifications within the cavernous internal carotid arteries. ORBITS: Bilateral lens replacement. No acute abnormality. SINUSES: No acute abnormality. SOFT TISSUES AND SKULL: No acute soft tissue abnormality. No skull fracture. IMPRESSION: 1. No acute intracranial abnormality. Electronically signed by: Lonni Necessary MD 07/17/2024 07:24 PM EST RP Workstation: [id number]        Scheduled Meds:  apixaban   5 mg Oral BID   atorvastatin   10 mg Oral Daily   cholecalciferol   2,000 Units Oral Daily   cyanocobalamin   1,000 mcg Oral Daily   ezetimibe   10 mg Oral Daily   folic acid   1 mg Oral Daily   melatonin  3 mg Oral QHS   melatonin  3 mg Oral Once   multivitamin with minerals  1 tablet Oral Daily   QUEtiapine   25 mg Oral BID   Continuous Infusions:  cefTRIAXone  (ROCEPHIN )  IV     lactated ringers  75 mL/hr at 07/17/24 2350     LOS: 1 day    Time spent: 35 minutes.    Leatrice Chapel, MD  [facility] Pager #: [phone] 7PM-7AM contact night coverage as above    "

## 2024-07-18 NOTE — ED Notes (Signed)
Pt ambulatory to restroom w/ minimal assistance.

## 2024-07-18 NOTE — Progress Notes (Signed)
 [facility] night cross cover note:   Patient agitated this evening, refusing her evening medications, including spitting out her Eliquis .  She does report some suprapubic discomfort, with bladder scan revealing 555 cc of urine.  I subsequent ordered straight cath x 1 dose, as well as a single dose of IV fentanyl  to help address her suprapubic discomfort.    Eva Pore, DO Hospitalist

## 2024-07-18 NOTE — Progress Notes (Signed)
 [facility] night cross cover note:  I was notified by RN that this patient is confused, agitated, repeatedly attempting to get out of bed on her own, with these behaviors refractory to attempts at verbal redirection.  Patient's RN conveys that existing order for Haldol  2 mg iv q6h prn has offered limited benefit, but notes that patient's agitation improved following a one time dose of prn IM Geodon  this afternoon.   In light of the above and in the setting of associated interference with ongoing medical treatment posing potential harm to themself, I have increased dose of prn iv haldol  to 5 mg iv q6h prn, and have added order for Geodon  20 mg IM x 1 prn for refractory agitation.     Debbie Pore, DO Hospitalist

## 2024-07-18 NOTE — ED Notes (Addendum)
 Patient becoming extremely anxious and restless. Toileting offered, patient assisted to rest room and when patient arrived back in room patient began pacing stating she is so nervous she feels sick. Despite reminders, patient repeatedly asking where her husband is. When patient is reminded that she spoke with husband on the phone and he said he will be back as soon as he can patient states she doesn't believe it. This RN noted patient's hands shaking from anxiousness. Attempts to redirect patient back in to bed result in patient stating she is too nervous to lie down. Per husband patient has not slept in 3 nights. Debbie DELENA Ned, MD notified of patients increasing anxiety and restlessness.

## 2024-07-19 ENCOUNTER — Inpatient Hospital Stay (HOSPITAL_COMMUNITY)

## 2024-07-19 DIAGNOSIS — Y92009 Unspecified place in unspecified non-institutional (private) residence as the place of occurrence of the external cause: Secondary | ICD-10-CM

## 2024-07-19 DIAGNOSIS — I10 Essential (primary) hypertension: Secondary | ICD-10-CM

## 2024-07-19 DIAGNOSIS — W19XXXA Unspecified fall, initial encounter: Secondary | ICD-10-CM

## 2024-07-19 DIAGNOSIS — R441 Visual hallucinations: Secondary | ICD-10-CM

## 2024-07-19 LAB — CBC WITH DIFFERENTIAL/PLATELET
Abs Immature Granulocytes: 0.01 K/uL (ref 0.00–0.07)
Basophils Absolute: 0.1 K/uL (ref 0.0–0.1)
Basophils Relative: 1 %
Eosinophils Absolute: 0.2 K/uL (ref 0.0–0.5)
Eosinophils Relative: 3 %
HCT: 35.3 % — ABNORMAL LOW (ref 36.0–46.0)
Hemoglobin: 12.1 g/dL (ref 12.0–15.0)
Immature Granulocytes: 0 %
Lymphocytes Relative: 27 %
Lymphs Abs: 1.4 K/uL (ref 0.7–4.0)
MCH: 31.2 pg (ref 26.0–34.0)
MCHC: 34.3 g/dL (ref 30.0–36.0)
MCV: 91 fL (ref 80.0–100.0)
Monocytes Absolute: 0.5 K/uL (ref 0.1–1.0)
Monocytes Relative: 10 %
Neutro Abs: 3.1 K/uL (ref 1.7–7.7)
Neutrophils Relative %: 59 %
Platelets: 210 K/uL (ref 150–400)
RBC: 3.88 MIL/uL (ref 3.87–5.11)
RDW: 13.2 % (ref 11.5–15.5)
WBC: 5.3 K/uL (ref 4.0–10.5)
nRBC: 0 % (ref 0.0–0.2)

## 2024-07-19 LAB — RENAL FUNCTION PANEL
Albumin: 3.7 g/dL (ref 3.5–5.0)
Anion gap: 10 (ref 5–15)
BUN: 12 mg/dL (ref 8–23)
CO2: 26 mmol/L (ref 22–32)
Calcium: 9.1 mg/dL (ref 8.9–10.3)
Chloride: 106 mmol/L (ref 98–111)
Creatinine, Ser: 0.7 mg/dL (ref 0.44–1.00)
GFR, Estimated: >60 mL/min
Glucose, Bld: 87 mg/dL (ref 70–99)
Phosphorus: 3.4 mg/dL (ref 2.5–4.6)
Potassium: 3.3 mmol/L — ABNORMAL LOW (ref 3.5–5.1)
Sodium: 141 mmol/L (ref 135–145)

## 2024-07-19 LAB — VITAMIN B12: Vitamin B-12: 2815 pg/mL — ABNORMAL HIGH (ref 180–914)

## 2024-07-19 LAB — URINE CULTURE: Culture: NO GROWTH

## 2024-07-19 LAB — MAGNESIUM: Magnesium: 2.1 mg/dL (ref 1.7–2.4)

## 2024-07-19 MED ORDER — QUETIAPINE FUMARATE 25 MG PO TABS: 25.0000 mg | ORAL_TABLET | Freq: Every morning | ORAL | Status: DC

## 2024-07-19 MED ORDER — POTASSIUM CHLORIDE 20 MEQ PO PACK
40.0000 meq | PACK | Freq: Once | ORAL | Status: AC
  Administered 2024-07-19: 40 meq via ORAL
  Filled 2024-07-19: qty 2

## 2024-07-19 MED ORDER — ENSURE PLUS HIGH PROTEIN PO LIQD
237.0000 mL | Freq: Two times a day (BID) | ORAL | Status: DC
  Administered 2024-07-20 – 2024-07-28 (×16): 237 mL via ORAL

## 2024-07-19 MED ORDER — QUETIAPINE FUMARATE 50 MG PO TABS
50.0000 mg | ORAL_TABLET | Freq: Every day | ORAL | Status: DC
  Administered 2024-07-19 – 2024-07-23 (×5): 50 mg via ORAL
  Filled 2024-07-19 (×3): qty 1
  Filled 2024-07-19: qty 2
  Filled 2024-07-19: qty 1

## 2024-07-19 MED ORDER — MELATONIN 5 MG PO TABS
5.0000 mg | ORAL_TABLET | Freq: Every day | ORAL | Status: DC
  Administered 2024-07-19 – 2024-07-27 (×9): 5 mg via ORAL
  Filled 2024-07-19 (×8): qty 1

## 2024-07-19 MED ORDER — METOPROLOL SUCCINATE ER 25 MG PO TB24
12.5000 mg | ORAL_TABLET | Freq: Every day | ORAL | Status: DC
  Administered 2024-07-19 – 2024-07-26 (×8): 12.5 mg via ORAL
  Filled 2024-07-19 (×8): qty 1

## 2024-07-19 MED ORDER — LORAZEPAM 2 MG/ML IJ SOLN
1.0000 mg | Freq: Once | INTRAMUSCULAR | Status: AC | PRN
  Administered 2024-07-19: 1 mg via INTRAVENOUS
  Filled 2024-07-19: qty 1

## 2024-07-19 MED ORDER — AMLODIPINE BESYLATE 10 MG PO TABS
10.0000 mg | ORAL_TABLET | Freq: Every day | ORAL | Status: DC
  Administered 2024-07-20 – 2024-07-26 (×7): 10 mg via ORAL
  Filled 2024-07-19 (×7): qty 1

## 2024-07-19 MED ORDER — AMLODIPINE BESYLATE 5 MG PO TABS
5.0000 mg | ORAL_TABLET | Freq: Every day | ORAL | Status: DC
  Administered 2024-07-19: 5 mg via ORAL
  Filled 2024-07-19: qty 1

## 2024-07-19 NOTE — Evaluation (Signed)
 Physical Therapy Evaluation Patient Details Name: Debbie Cardenas MRN: [id number] DOB: [birth year] Today's Date: 07/19/2024  History of Present Illness  Pt is a 78 y/o F who presented to [facility] ED for cognitive decline per husband. Pt admitted for workup of AMS. PMHx: recent cognitive decline without formal dx of dementia, anxiety, HLD, HTN, L femoral neck fx (2020), R TKA (2024).  Clinical Impression  Pt admitted with above diagnosis. PTA, pt ambulated without an AD per spouse. She has had 3 falls in the past week. Pt lives with her husband in a one story house with 5 STE. Pt currently with functional limitations due to the deficits listed below (see PT Problem List). She required max-totalA x2 for bed mobility, maxA x2 for sit<>stand, and maxA x2 for short-distance ambulation within the room using 2 HHA. Pt is currently limited by impaired cognition secondary to AMS and dementia, emotional lability, BLE weakness, impaired balance, and decreased activity tolerance. Pt will benefit from acute skilled PT to increase her independence and safety with mobility to allow discharge. Spouse is wanting to take pt home upon discharge, was inquiring about caregivers/assistance at home. Currently recommend HHPT and 24/7 supervision/assist with all care and recommend +2 for safety with OOB mobility.     If plan is discharge home, recommend the following: Supervision due to cognitive status;Direct supervision/assist for medications management;Direct supervision/assist for financial management;Two people to help with walking and/or transfers;A lot of help with bathing/dressing/bathroom;Assistance with cooking/housework;Assist for transportation;Help with stairs or ramp for entrance   Can travel by private vehicle        Equipment Recommendations Wheelchair (measurements PT)  Recommendations for Other Services       Functional Status Assessment Patient has had a recent decline in their functional status and  demonstrates the ability to make significant improvements in function in a reasonable and predictable amount of time.     Precautions / Restrictions Precautions Precautions: Fall Recall of Precautions/Restrictions: Impaired Precaution/Restrictions Comments: delirium prevention precautions Restrictions Weight Bearing Restrictions Per Provider Order: No      Mobility  Bed Mobility Overal bed mobility: Needs Assistance Bed Mobility: Supine to Sit, Sit to Supine     Supine to sit: Max assist, +2 for physical assistance, +2 for safety/equipment Sit to supine: Total assist, +2 for physical assistance, +2 for safety/equipment   General bed mobility comments: exited to the L side, with prompting and TC, pt initiating sitting upright, needs incr A to return sit>supine 2/2 heightened anxiety and no command following/pt hallucinating    Transfers Overall transfer level: Needs assistance Equipment used: 2 person hand held assist Transfers: Sit to/from Stand Sit to Stand: Max assist, +2 physical assistance, +2 safety/equipment           General transfer comment: stood from bed with poor sequencing, pt initially stood into a squatted position with B knee flex and poor correction, necessitating seated rest break and re-attempt; able to achieve upright posture with multimodal cues to fully stand. Pt required increased time and facilitation to sit on commode and back in bed. She opted to hug her husband and with his support would allow her knees to relax and bend.    Ambulation/Gait Ambulation/Gait assistance: Max assist, +2 physical assistance, +2 safety/equipment (Husband intermittently providing HHA to comfort/console the pt.) Gait Distance (Feet): 8 Feet (x2) Assistive device: 2 person hand held assist Gait Pattern/deviations: Step-to pattern, Decreased dorsiflexion - right, Decreased dorsiflexion - left, Leaning posteriorly, Narrow base of support Gait velocity: decreased  General  Gait Details: Pt ambulated to/from the bathroom with maxA x2-3 with Husband intermittently assisting. She required dense multi-modal cues to participate. PT faciliated weight shift, limb advancement, and hips to pivot/turn. Pt demonstrated a posterior lean and would intermittently lock out her knees. Very unsteady with varying levels of knee flex/ext noted bilaterally. Poor motor planning.  Stairs            Wheelchair Mobility     Tilt Bed    Modified Rankin (Stroke Patients Only)       Balance Overall balance assessment: Needs assistance Sitting-balance support: Bilateral upper extremity supported, Feet supported Sitting balance-Leahy Scale: Poor Sitting balance - Comments: min/mod A to maintain at times 2/2 pt not following commands/confused Postural control: Other (comment) (multidirectional) Standing balance support: Bilateral upper extremity supported, During functional activity Standing balance-Leahy Scale: Zero Standing balance comment: max A +2 to maintain upright and to sequence navigating unfamiliar environment                             Pertinent Vitals/Pain Pain Assessment Pain Assessment: PAINAD Breathing: occasional labored breathing, short period of hyperventilation Negative Vocalization: repeated troubled calling out, loud moaning/groaning, crying Facial Expression: sad, frightened, frown Body Language: tense, distressed pacing, fidgeting Consolability: distracted or reassured by voice/touch PAINAD Score: 6 Pain Intervention(s): Monitored during session, Repositioned    Home Living Family/patient expects to be discharged to:: Private residence Living Arrangements: Spouse/significant other Available Help at Discharge: Family Type of Home: House Home Access: Stairs to enter Entrance Stairs-Rails: Right;Left;Can reach both Secretary/administrator of Steps: 5   Home Layout: One level Home Equipment: Shower seat;Grab bars - tub/shower;Hand  held Programmer, Systems (2 wheels);Cane - single point      Prior Function Prior Level of Function : Independent/Modified Independent             Mobility Comments: Ambulates without AD. Pt fell 3 times recently, one during the night in the bathroom. Husband was able to pick her up and her son has picked her up off the floor before. ADLs Comments: Indep with ADLs. Spouse reports pt stays active, she just goes     Extremity/Trunk Assessment   Upper Extremity Assessment Upper Extremity Assessment: Defer to OT evaluation    Lower Extremity Assessment Lower Extremity Assessment: Difficult to assess due to impaired cognition;Generalized weakness    Cervical / Trunk Assessment Cervical / Trunk Assessment: Kyphotic  Communication   Communication Communication: Impaired Factors Affecting Communication: Difficulty expressing self;Reduced clarity of speech    Cognition Arousal: Alert (but eyes remained closed for duration of session) Behavior During Therapy: Anxious, Impulsive, Lability, Restless   PT - Cognitive impairments: History of cognitive impairments (Dementia)                       PT - Cognition Comments: Spouse reports pt's cognition is significantly declined compared to home. She was very nervous and fearful of falling. Frequently calling out for her husband and able to calm down with his touch. Minimal command following noted <10%. Following commands: Impaired Following commands impaired: Follows one step commands inconsistently (follows < 10%)     Cueing Cueing Techniques: Verbal cues, Gestural cues, Tactile cues, Visual cues (multimodal cueing)     General Comments General comments (skin integrity, edema, etc.): supportive spouse present and assisting therapists during session today    Exercises     Assessment/Plan    PT Assessment  Patient needs continued PT services  PT Problem List Decreased cognition;Decreased safety awareness;Decreased  strength;Decreased activity tolerance;Decreased balance;Decreased mobility;Decreased knowledge of use of DME       PT Treatment Interventions DME instruction;Gait training;Stair training;Functional mobility training;Therapeutic activities;Therapeutic exercise;Balance training;Patient/family education    PT Goals (Current goals can be found in the Care Plan section)  Acute Rehab PT Goals Patient Stated Goal: Husband reports to take her home and keep her safely mobile PT Goal Formulation: With family Time For Goal Achievement: 08/02/24 Potential to Achieve Goals: Fair    Frequency Min 2X/week     Co-evaluation PT/OT/SLP Co-Evaluation/Treatment: Yes Reason for Co-Treatment: Necessary to address cognition/behavior during functional activity;For patient/therapist safety;To address functional/ADL transfers           AM-PAC PT 6 Clicks Mobility  Outcome Measure Help needed turning from your back to your side while in a flat bed without using bedrails?: Total Help needed moving from lying on your back to sitting on the side of a flat bed without using bedrails?: Total Help needed moving to and from a bed to a chair (including a wheelchair)?: Total Help needed standing up from a chair using your arms (e.g., wheelchair or bedside chair)?: Total Help needed to walk in hospital room?: Total Help needed climbing 3-5 steps with a railing? : Total 6 Click Score: 6    End of Session Equipment Utilized During Treatment: Gait belt Activity Tolerance: Patient tolerated treatment well Patient left: in bed;with call bell/phone within reach;with bed alarm set;with family/visitor present Nurse Communication: Mobility status;Need for lift equipment PT Visit Diagnosis: Difficulty in walking, not elsewhere classified (R26.2);Muscle weakness (generalized) (M62.81);Other abnormalities of gait and mobility (R26.89);Unsteadiness on feet (R26.81)    Time: 8578-8550 PT Time Calculation (min) (ACUTE  ONLY): 28 min   Charges:   PT Evaluation $PT Eval Moderate Complexity: 1 Mod   PT General Charges $$ ACUTE PT VISIT: 1 Visit         Randall SAUNDERS, PT, DPT Acute Rehabilitation Services Office: [phone] Secure Chat Preferred  Delon CHRISTELLA Callander 07/19/2024, 4:58 PM

## 2024-07-19 NOTE — Plan of Care (Signed)
" °  Problem: Clinical Measurements: Goal: Ability to maintain clinical measurements within normal limits will improve Outcome: Progressing   Problem: Nutrition: Goal: Adequate nutrition will be maintained Outcome: Not Progressing   Problem: Elimination: Goal: Will not experience complications related to urinary retention Outcome: Progressing   "

## 2024-07-19 NOTE — Evaluation (Signed)
 Occupational Therapy Evaluation Patient Details Name: Debbie Cardenas MRN: [id number] DOB: 1946-02-03 Today's Date: 07/19/2024   History of Present Illness   Pt is a 78 y/o F who presented to [facility] ED for cognitive decline per husband. Pt admitted for workup of AMS. PMHx: recent cognitive decline without formal dx of dementia, anxiety, HLD, HTN, L femoral neck fx (2020), R TKA (2024).     Clinical Impressions Pt seen for OT eval this PM. Supportive spouse at bedside, providing PLOF info as pt continues to be altered and confused. PTA, pt was relatively indep with BADLs and mobility, reports she is far from her typical cognitive baseline (although endorses recent cognitive decline over the last several months). Functionally, she was max A +2 to stand and ambulate to bathroom. Pt following < 10% of commands, emotionally labile, + visual hallucinations per her report (although kept eyes closed entire session). Max/total A for all self-care. Provided washcloth for pt to fidget with seeing as she pulled out several lines (PIV, purewick, etc.). Spouse is wanting to take pt home upon discharge, was inquiring about caregivers/assistance at home. Currently recommend HHOT and 24/7 supervision/assist with all care. OT to continue to follow.     If plan is discharge home, recommend the following:   Two people to help with walking and/or transfers;Two people to help with bathing/dressing/bathroom;Assistance with cooking/housework;Assistance with feeding;Direct supervision/assist for medications management;Direct supervision/assist for financial management;Assist for transportation;Help with stairs or ramp for entrance;Supervision due to cognitive status     Functional Status Assessment   Patient has had a recent decline in their functional status and demonstrates the ability to make significant improvements in function in a reasonable and predictable amount of time.     Equipment Recommendations    Wheelchair (measurements OT);Wheelchair cushion (measurements OT)     Recommendations for Other Services         Precautions/Restrictions   Precautions Precautions: Fall Recall of Precautions/Restrictions: Impaired Precaution/Restrictions Comments: delirium prevention precautions Restrictions Weight Bearing Restrictions Per Provider Order: No     Mobility Bed Mobility Overal bed mobility: Needs Assistance Bed Mobility: Supine to Sit, Sit to Supine     Supine to sit: Max assist, +2 for physical assistance, +2 for safety/equipment Sit to supine: Total assist, +2 for physical assistance, +2 for safety/equipment   General bed mobility comments: exited to the L side, with prompting and TC, pt initiating sitting upright, needs incr A to return sit>supine 2/2 heightened anxiety and no command following/pt hallucinating    Transfers Overall transfer level: Needs assistance   Transfers: Sit to/from Stand Sit to Stand: Max assist, +2 physical assistance, +2 safety/equipment           General transfer comment: stood from bed with poor sequencing, pt initially stood into squatting position with poor correction, necessitating seated rest break and re-attempt; multimodal cues to fully stand      Balance Overall balance assessment: Needs assistance Sitting-balance support: Bilateral upper extremity supported, Feet supported Sitting balance-Leahy Scale: Poor Sitting balance - Comments: min/mod A to maintain at times 2/2 pt not following commands/confused Postural control: Other (comment) (multidirectional) Standing balance support: Bilateral upper extremity supported, During functional activity Standing balance-Leahy Scale: Zero Standing balance comment: max A +2 to maintain upright and to sequence navigating unfamiliar environment                           ADL either performed or assessed with clinical judgement  ADL Overall ADL's : Needs  assistance/impaired Eating/Feeding: Maximal assistance   Grooming: Maximal assistance   Upper Body Bathing: Total assistance   Lower Body Bathing: Total assistance   Upper Body Dressing : Total assistance   Lower Body Dressing: Total assistance   Toilet Transfer: Maximal assistance;+2 for physical assistance;+2 for safety/equipment   Toileting- Clothing Manipulation and Hygiene: Total assistance;+2 for physical assistance;+2 for safety/equipment       Functional mobility during ADLs: Maximal assistance;+2 for physical assistance;+2 for safety/equipment       Vision         Perception         Praxis         Pertinent Vitals/Pain Pain Assessment Pain Assessment: Faces Faces Pain Scale: Hurts little more Pain Location: generalized with mobility Pain Descriptors / Indicators: Grimacing, Crying Pain Intervention(s): Limited activity within patient's tolerance, Repositioned     Extremity/Trunk Assessment Upper Extremity Assessment Upper Extremity Assessment: Generalized weakness (UTA formally 2/2 cog)   Lower Extremity Assessment Lower Extremity Assessment: Generalized weakness (UTA formally 2/2 cog)       Communication Communication Communication: Impaired Factors Affecting Communication: Difficulty expressing self;Reduced clarity of speech   Cognition Arousal: Alert (but eyes remained closed for duration of session) Behavior During Therapy: Anxious, Impulsive, Lability, Restless (very labile) Cognition: History of cognitive impairments (recent cognitive decline per husband)             OT - Cognition Comments: husband reports pt has had recent cog decline that has progressively worsened last ~5 months, reports she is normally able to move about the house and needs some cognitive assist with BADLs, otherwise is relatively indep and with calm demeanor                 Following commands: Impaired Following commands impaired: Follows one step  commands inconsistently (follows < 10%)     Cueing  General Comments   Cueing Techniques: Verbal cues;Gestural cues;Tactile cues;Visual cues (multimodal cueing)  supportive spouse present and assisting therapists during session today   Exercises     Shoulder Instructions      Home Living Family/patient expects to be discharged to:: Private residence Living Arrangements: Spouse/significant other Available Help at Discharge: Family Type of Home: House Home Access: Stairs to enter Secretary/administrator of Steps: 5 Entrance Stairs-Rails: Right;Left;Can reach both Home Layout: One level     Bathroom Shower/Tub: Producer, Television/film/video: Standard     Home Equipment: Shower seat;Grab bars - tub/shower;Hand held Programmer, Systems (2 wheels);Cane - single point          Prior Functioning/Environment Prior Level of Function : Independent/Modified Independent             Mobility Comments: Ambulates without AD. Pt fell 3 times recently, one during the night in the bathroom. Husband was able to pick her up and her son has picked her up off the floor before. ADLs Comments: Indep with ADLs. Spouse reports pt stays active, she just goes    OT Problem List: Decreased strength;Impaired balance (sitting and/or standing);Decreased cognition;Decreased safety awareness;Decreased knowledge of precautions   OT Treatment/Interventions: Self-care/ADL training;Therapeutic exercise;DME and/or AE instruction;Energy conservation;Therapeutic activities;Cognitive remediation/compensation;Patient/family education;Balance training      OT Goals(Current goals can be found in the care plan section)   Acute Rehab OT Goals Patient Stated Goal: did not state; spouse reports he wants to take her back home OT Goal Formulation: With family Time For Goal Achievement: 08/02/24 Potential to Achieve  Goals: Fair   OT Frequency:  Min 2X/week    Co-evaluation               AM-PAC OT 6 Clicks Daily Activity     Outcome Measure Help from another person eating meals?: A Lot Help from another person taking care of personal grooming?: A Lot Help from another person toileting, which includes using toliet, bedpan, or urinal?: Total Help from another person bathing (including washing, rinsing, drying)?: Total Help from another person to put on and taking off regular upper body clothing?: Total Help from another person to put on and taking off regular lower body clothing?: Total 6 Click Score: 8   End of Session Equipment Utilized During Treatment: Gait belt Nurse Communication: Mobility status;Other (comment) (pt status at end of visit)  Activity Tolerance: Other (comment) (limited by impaired cognition) Patient left: in bed;with call bell/phone within reach;with bed alarm set;with family/visitor present  OT Visit Diagnosis: Unsteadiness on feet (R26.81);Muscle weakness (generalized) (M62.81);Other abnormalities of gait and mobility (R26.89);Other symptoms and signs involving cognitive function                Time: 1421-1447 OT Time Calculation (min): 26 min Charges:  OT General Charges $OT Visit: 1 Visit OT Evaluation $OT Eval Moderate Complexity: 1 Mod  Kasiyah Platter M. [place], OTR/L [facility] Acute Rehabilitation Services [phone] Secure Chat Preferred  Derion Kreiter 07/19/2024, 4:26 PM

## 2024-07-19 NOTE — Progress Notes (Addendum)
 "                        PROGRESS NOTE        PATIENT DETAILS Name: Debbie Cardenas Age: 78 y.o. Sex: female Date of Birth: 1946-07-19 Admit Date: 07/17/2024 Admitting Physician Camila DELENA Ned, MD ERE:[id number], [place], [place]  Brief Summary: Patient is a 78 y.o.  female with history of presumed dementia, A-fib, CAD-s/p CABG 2022-presented with frequent falls and worsening altered mental status beyond her usual baseline.  Significant events: 12/19>> admit to [facility]  Significant studies: 12/19>> CT head: No significant acute intracranial abnormality 12/19>> CT C-spine: No acute abnormality 12/19>> CT T-spine: No acute abnormality 12/19>> CT L-spine: No acute findings 12/19>> CXR: No PNA 12/19>> CT renal stone study: No hydronephrosis-no obstructing calculi. 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  Significant microbiology data: 12/19>> COVID/influenza/RSV PCR: Negative 12/19>> urine culture: Pending  Procedures: None  Consults: None  Subjective: Received Haldol  last night-sleeping this morning-easily arouses-answers some questions appropriately-requires minimal redirection.  Objective: Vitals: Blood pressure (!) 152/54, pulse 81, temperature 98.4 F (36.9 C), temperature source Axillary, resp. rate 11, height 5' 5 (1.651 m), SpO2 95%.   Exam: Gen Exam:Alert awake-not in any distress HEENT:atraumatic, normocephalic Chest: B/L clear to auscultation anteriorly CVS:S1S2 regular Abdomen:soft non tender, non distended Extremities:no edema Neurology: Non focal-generalized weakness. Skin: no rash  Pertinent Labs/Radiology:    Latest Ref Rng & Units 07/19/2024    3:58 AM 07/18/2024    4:56 AM 07/17/2024    5:19 PM  CBC  WBC 4.0 - 10.5 K/uL 5.3  4.9  5.2   Hemoglobin 12.0 - 15.0 g/dL 87.8  87.9  87.5    Hematocrit 36.0 - 46.0 % 35.3  34.8  37.1   Platelets 150 - 400 K/uL 210  194  215     Lab Results  Component Value Date   NA 141 07/19/2024   K 3.3 (L) 07/19/2024   CL 106 07/19/2024   CO2 26 07/19/2024      Assessment/Plan: Acute metabolic encephalopathy Suspect this is probably related to dementia (probable/undiagnosed)-unclear if she actually has a UTI-UA is equivocal-cultures pending-no recent new medication changes, electrolytes stable, no major structural issues seen on neuroimaging. Will check vitamin B12 and EEG Since continues to have delirium-mostly at night-increase p.m. dose of Seroquel  to 50 mg Add low-dose melatonin Maintain delirium precautions  Hypertensive urgency BP on the higher side Resume metoprolol  Resume amlodipine  Follow and adjust  ?  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  Undiagnosed dementia Spouse acknowledges several years of gradually declining short-term memory issues-claims that this occurred after she had 3 surgeries in the span of 3 years. Workup as above Delirium precautions as above Will need outpatient neuro cognitive evaluation  Code status:   Code Status: Full Code   DVT Prophylaxis: apixaban  (ELIQUIS ) tablet 5 mg    Family Communication: Spouse at bedside   Disposition Plan: Status is: Inpatient Remains inpatient appropriate because: Severity of illness   Planned Discharge Destination:Home health   Diet: Diet  Order             Diet Heart Room service appropriate? Yes; Fluid consistency: Thin  Diet effective now                      Antimicrobial agents: Anti-infectives (From admission, onward)    Start     Dose/Rate Route Frequency Ordered Stop   07/18/24 2200  cefTRIAXone  (ROCEPHIN ) 1 g in sodium chloride  0.9 % 100 mL IVPB        1 g 200 mL/hr over 30 Minutes Intravenous Every 24 hours 07/18/24 0709     07/17/24 2030  cefTRIAXone  (ROCEPHIN ) 1 g in sodium chloride  0.9 % 100 mL IVPB        1 g 200 mL/hr over 30 Minutes Intravenous  Once 07/17/24 2029 07/17/24 2306        MEDICATIONS: Scheduled Meds:  amLODipine   5 mg Oral Once   apixaban   5 mg Oral BID   atorvastatin   10 mg Oral Daily   cholecalciferol   2,000 Units Oral Daily   cyanocobalamin   1,000 mcg Oral Daily   ezetimibe   10 mg Oral Daily   folic acid   1 mg Oral Daily   melatonin  5 mg Oral QHS   multivitamin with minerals  1 tablet Oral Daily   potassium chloride   40 mEq Oral Once   [START ON 07/20/2024] QUEtiapine   25 mg Oral q morning   QUEtiapine   50 mg Oral QHS   Continuous Infusions:  cefTRIAXone  (ROCEPHIN )  IV 1 g (07/18/24 2306)   PRN Meds:.acetaminophen  **OR** acetaminophen , albuterol , haloperidol  lactate, hydrALAZINE , ondansetron  **OR** ondansetron  (ZOFRAN ) IV, ziprasidone    I have personally reviewed following labs and imaging studies  LABORATORY DATA: CBC: Recent Labs  Lab 07/17/24 1719 07/18/24 0456 07/19/24 0358  WBC 5.2 4.9 5.3  NEUTROABS 2.9  --  3.1  HGB 12.4 12.0 12.1  HCT 37.1 34.8* 35.3*  MCV 93.7 90.9 91.0  PLT 215 194 210    Basic Metabolic Panel: Recent Labs  Lab 07/17/24 1719 07/18/24 0456 07/19/24 0358  NA 141 141 141  K 3.7 3.4* 3.3*  CL 105 107 106  CO2 27 26 26   GLUCOSE 93 100* 87  BUN 23 17 12   CREATININE 0.88 0.77 0.70  CALCIUM  9.7 9.4 9.1  MG 2.1  --  2.1  PHOS  --   --  3.4    GFR: CrCl cannot be calculated (Unknown ideal weight.).  Liver Function Tests: Recent Labs  Lab 07/17/24 1719 07/18/24 0456 07/19/24 0358  AST 22 20  --   ALT 11 11  --   ALKPHOS 67 61  --    BILITOT 0.7 0.6  --   PROT 6.6 6.3*  --   ALBUMIN  4.2 4.0 3.7   No results for input(s): LIPASE, AMYLASE in the last 168 hours. Recent Labs  Lab 07/17/24 2228  AMMONIA 35    Coagulation Profile: No results for input(s): INR, PROTIME in the last 168 hours.  Cardiac Enzymes: No results for input(s): CKTOTAL, CKMB, CKMBINDEX, TROPONINI in the last 168 hours.  BNP (last 3 results) No results for input(s): PROBNP in the last 8760 hours.  Lipid Profile: No results for input(s): CHOL, HDL, LDLCALC, TRIG, CHOLHDL, LDLDIRECT in the last 72 hours.  Thyroid  Function Tests: Recent Labs    07/17/24 2228  TSH 0.396    Anemia Panel: No results for input(s): VITAMINB12, FOLATE, FERRITIN, TIBC, IRON, RETICCTPCT in the last 72  hours.  Urine analysis:    Component Value Date/Time   COLORURINE YELLOW 07/17/2024 1820   APPEARANCEUR CLOUDY (A) 07/17/2024 1820   LABSPEC 1.015 07/17/2024 1820   PHURINE 7.0 07/17/2024 1820   GLUCOSEU NEGATIVE 07/17/2024 1820   HGBUR LARGE (A) 07/17/2024 1820   BILIRUBINUR NEGATIVE 07/17/2024 1820   KETONESUR NEGATIVE 07/17/2024 1820   PROTEINUR NEGATIVE 07/17/2024 1820   NITRITE NEGATIVE 07/17/2024 1820   LEUKOCYTESUR SMALL (A) 07/17/2024 1820    Sepsis Labs: Lactic Acid, Venous No results found for: LATICACIDVEN  MICROBIOLOGY: Recent Results (from the past 240 hours)  Resp panel by RT-PCR (RSV, Flu A&B, Covid) Anterior Nasal Swab     Status: None   Collection Time: 07/17/24  5:20 PM   Specimen: Anterior Nasal Swab  Result Value Ref Range Status   SARS Coronavirus 2 by RT PCR NEGATIVE NEGATIVE Final   Influenza A by PCR NEGATIVE NEGATIVE Final   Influenza B by PCR NEGATIVE NEGATIVE Final    Comment: (NOTE) The Xpert Xpress SARS-CoV-2/FLU/RSV plus assay is intended as an aid in the diagnosis of influenza from Nasopharyngeal swab specimens and should not be used as a sole basis for treatment. Nasal  washings and aspirates are unacceptable for Xpert Xpress SARS-CoV-2/FLU/RSV testing.  Fact Sheet for Patients: bloggercourse.com  Fact Sheet for Healthcare Providers: seriousbroker.it  This test is not yet approved or cleared by the [place]  FDA and has been authorized for detection and/or diagnosis of SARS-CoV-2 by FDA under an Emergency Use Authorization (EUA). This EUA will remain in effect (meaning this test can be used) for the duration of the COVID-19 declaration under Section 564(b)(1) of the Act, 21 U.S.C. section 360bbb-3(b)(1), unless the authorization is terminated or revoked.     Resp Syncytial Virus by PCR NEGATIVE NEGATIVE Final    Comment: (NOTE) Fact Sheet for Patients: bloggercourse.com  Fact Sheet for Healthcare Providers: seriousbroker.it  This test is not yet approved or cleared by the [place]  FDA and has been authorized for detection and/or diagnosis of SARS-CoV-2 by FDA under an Emergency Use Authorization (EUA). This EUA will remain in effect (meaning this test can be used) for the duration of the COVID-19 declaration under Section 564(b)(1) of the Act, 21 U.S.C. section 360bbb-3(b)(1), unless the authorization is terminated or revoked.  Performed at [facility], [address], [place], [place] [address]     RADIOLOGY STUDIES/RESULTS: MR BRAIN WO CONTRAST Result Date: 07/18/2024 CLINICAL DATA:  Initial evaluation for acute mental status change, unknown cause. EXAM: MRI HEAD WITHOUT CONTRAST TECHNIQUE: Multiplanar, multiecho pulse sequences of the brain and surrounding structures were obtained without intravenous contrast. COMPARISON:  CT from 07/17/2024. FINDINGS: Brain: Examination mildly degraded by motion artifact. Cerebral volume within normal limits. Patchy T2/FLAIR hyperintensity involving the periventricular and deep white matter  both cerebral hemispheres, most characteristic of chronic microvascular ischemic disease, mild for age. No evidence for acute or subacute infarct. No areas of chronic cortical infarction. No acute or chronic intracranial blood products. No mass lesion, midline shift or mass effect. No hydrocephalus or extra-axial fluid collection. Pituitary gland within normal limits. Vascular: Major intracranial arterial vascular flow voids are maintained. Intermittent FLAIR signal abnormality noted about the posterior aspect of the superior sagittal sinus and transverse at the sigmoid sinuses bilaterally (series 9, image 6) for example. No signal changes seen on corresponding sequences. Overall appearance is favored to be artifactual in nature. Skull and upper cervical spine: Craniocervical junction within normal limits. Bone marrow signal  intensity normal. No scalp soft tissue abnormality. Sinuses/Orbits: Prior bilateral ocular lens replacement. Paranasal sinuses are largely clear. No significant mastoid effusion. Other: None. IMPRESSION: 1. Apparent FLAIR signal abnormality involving the transverse and sigmoid sinuses bilaterally. While this finding is favored to be artifactual in nature on this somewhat motion degraded exam, possible dural venous sinus thrombosis could also potentially have this appearance, and could be considered in the correct clinical setting. Correlation with dedicated MRV suggested as clinically warranted. 2. No other acute intracranial abnormality. 3. Mild chronic microvascular ischemic disease for age. Electronically Signed   By: Morene Hoard M.D.   On: 07/18/2024 23:12   CT RENAL STONE STUDY Result Date: 07/18/2024 CLINICAL DATA:  Hematuria. EXAM: CT ABDOMEN AND PELVIS WITHOUT CONTRAST TECHNIQUE: Multidetector CT imaging of the abdomen and pelvis was performed following the standard protocol without IV contrast. RADIATION DOSE REDUCTION: This exam was performed according to the departmental  dose-optimization program which includes automated exposure control, adjustment of the mA and/or kV according to patient size and/or use of iterative reconstruction technique. COMPARISON:  None Available. FINDINGS: Evaluation of this exam is limited in the absence of intravenous contrast. Evaluation is also limited due to respiratory motion and streak artifact caused by patient's arms. Lower chest: The visualized lung bases are clear. There is coronary vascular calcification. No intra-abdominal free air or free fluid. Hepatobiliary: The liver is unremarkable. No biliary dilatation. Cholecystectomy. Pancreas: Unremarkable. No pancreatic ductal dilatation or surrounding inflammatory changes. Spleen: Normal in size without focal abnormality. Adrenals/Urinary Tract: The adrenal glands unremarkable. There is a 9 mm nonobstructing left renal inferior pole calculus. No hydronephrosis. There is no hydronephrosis or nephrolithiasis on the right. The visualized ureters and urinary bladder appear unremarkable. Stomach/Bowel: There is moderate stool throughout the colon. There is no bowel obstruction or active inflammation. Appendectomy. Vascular/Lymphatic: Moderate aortoiliac atherosclerotic disease. The IVC is unremarkable. No gross gas. There is no adenopathy. Reproductive: Small calcified uterine fibroid. No suspicious adnexal masses. Other: Small fat containing right inguinal hernia. Musculoskeletal: Osteopenia with degenerative changes of the spine. Total left hip arthroplasty. No acute osseous pathology. IMPRESSION: 1. A 9 mm nonobstructing left renal inferior pole calculus. No hydronephrosis. 2. Moderate colonic stool burden. No bowel obstruction. 3.  Aortic Atherosclerosis (ICD10-I70.0). Electronically Signed   By: Vanetta Chou M.D.   On: 07/18/2024 12:47   DG Chest Portable 1 View Result Date: 07/17/2024 EXAM: 1 VIEW(S) XRAY OF THE CHEST 07/17/2024 07:55:00 PM COMPARISON: 10/09/2022. CLINICAL HISTORY: falls,  AMS FINDINGS: LUNGS AND PLEURA: No focal pulmonary opacity. No pleural effusion. No pneumothorax. HEART AND MEDIASTINUM: Surgical changes in mediastinum. Aortic calcification. BONES AND SOFT TISSUES: Intact sternotomy wires. No acute osseous abnormality. IMPRESSION: 1. No acute process. Electronically signed by: Norman Gatlin MD 07/17/2024 07:59 PM EST RP Workstation: [id number]   CT Thoracic Spine Wo Contrast Result Date: 07/17/2024 EXAM: CT THORACIC SPINE WITHOUT CONTRAST 07/17/2024 06:57:00 PM TECHNIQUE: CT of the thoracic spine was performed without the administration of intravenous contrast. Multiplanar reformatted images are provided for review. Automated exposure control, iterative reconstruction, and/or weight based adjustment of the mA/kV was utilized to reduce the radiation dose to as low as reasonably achievable. COMPARISON: None available. CLINICAL HISTORY: Ataxia, thoracic trauma FINDINGS: BONES AND ALIGNMENT: Normal vertebral body heights. Remote superior endplate fracture is present at T12. No acute fracture or suspicious bone lesion. Normal alignment. DEGENERATIVE CHANGES: No significant degenerative changes. SOFT TISSUES: No acute abnormality. VASCULATURE: Atherosclerotic changes are present in the aorta and branch vessels. No  aneurysm is present. IMPRESSION: 1. No acute abnormality of the thoracic spine. 2. Remote superior endplate fracture at T12. Electronically signed by: Lonni Necessary MD 07/17/2024 07:38 PM EST RP Workstation: [id number]   CT Cervical Spine Wo Contrast Result Date: 07/17/2024 EXAM: CT CERVICAL SPINE WITHOUT CONTRAST 07/17/2024 06:57:00 PM TECHNIQUE: CT of the cervical spine was performed without the administration of intravenous contrast. Multiplanar reformatted images are provided for review. Automated exposure control, iterative reconstruction, and/or weight based adjustment of the mA/kV was utilized to reduce the radiation dose to as low as reasonably  achievable. COMPARISON: None available. CLINICAL HISTORY: Neck trauma (Age >= 65y) FINDINGS: BONES AND ALIGNMENT: No acute fracture or traumatic malalignment. DEGENERATIVE CHANGES: Grade 1 degenerative anterolisthesis is present at C3-C4 and C4-C5. Facet hypertrophy contributes to foraminal narrowing bilaterally at C3-C4 and C4-C5. Uncovertebral spurring contributes to foraminal narrowing bilaterally at C5-C6 and C6-C7. SOFT TISSUES: No prevertebral soft tissue swelling. IMPRESSION: 1. No evidence of acute traumatic injury. Electronically signed by: Lonni Necessary MD 07/17/2024 07:31 PM EST RP Workstation: [id number]   CT Lumbar Spine Wo Contrast Result Date: 07/17/2024 EXAM: CT OF THE LUMBAR SPINE WITHOUT CONTRAST 07/17/2024 06:57:00 PM TECHNIQUE: CT of the lumbar spine was performed without the administration of intravenous contrast. Multiplanar reformatted images are provided for review. Automated exposure control, iterative reconstruction, and/or weight based adjustment of the mA/kV was utilized to reduce the radiation dose to as low as reasonably achievable. COMPARISON: None available. CLINICAL HISTORY: Back trauma, no prior imaging (Age >= 16y). Unwitnessed fall. FINDINGS: BONES AND ALIGNMENT: Normal vertebral body heights. A superior endplate fracture at T12 appears remote. No acute fracture or suspicious bone lesion. Normal alignment. DEGENERATIVE CHANGES: Moderate central canal stenosis is present at C2-C3, C3-C4, and C4-C5 secondary to broad-based disc protrusions and ligamentum flavum thickening. Moderate foraminal narrowing is present on the left at L4-L5. SOFT TISSUES: Atherosclerotic calcifications are present in the aorta and branch vessels without aneurysm. No acute abnormality. IMPRESSION: 1. No acute findings. 2. Remote superior endplate fracture at T12. Electronically signed by: Lonni Necessary MD 07/17/2024 07:26 PM EST RP Workstation: [id number]   CT Head Wo Contrast Result Date:  07/17/2024 EXAM: CT HEAD WITHOUT CONTRAST 07/17/2024 06:57:00 PM TECHNIQUE: CT of the head was performed without the administration of intravenous contrast. Automated exposure control, iterative reconstruction, and/or weight based adjustment of the mA/kV was utilized to reduce the radiation dose to as low as reasonably achievable. COMPARISON: None available. CLINICAL HISTORY: Head trauma, minor (Age >= 65y). Unwitnessed fall. FINDINGS: BRAIN AND VENTRICLES: No acute hemorrhage. No evidence of acute infarct. No hydrocephalus. No extra-axial collection. No mass effect or midline shift. Atherosclerotic calcifications within the cavernous internal carotid arteries. ORBITS: Bilateral lens replacement. No acute abnormality. SINUSES: No acute abnormality. SOFT TISSUES AND SKULL: No acute soft tissue abnormality. No skull fracture. IMPRESSION: 1. No acute intracranial abnormality. Electronically signed by: Lonni Necessary MD 07/17/2024 07:24 PM EST RP Workstation: [id number]     LOS: 2 days   Donalda Applebaum, MD  [facility]    To contact the attending provider between 7A-7P or the covering provider during after hours 7P-7A, please log into the web site [email or web address] and access using universal [place] password for that web site. If you do not have the password, please call the hospital operator.  07/19/2024, 9:11 AM    "

## 2024-07-19 NOTE — Progress Notes (Signed)
 EEG complete - results pending

## 2024-07-19 NOTE — Progress Notes (Signed)
 Unable to do suicide screening pt altered.   Per husband he does not have a concern.

## 2024-07-20 ENCOUNTER — Inpatient Hospital Stay (HOSPITAL_COMMUNITY)

## 2024-07-20 DIAGNOSIS — R4182 Altered mental status, unspecified: Secondary | ICD-10-CM

## 2024-07-20 DIAGNOSIS — F0390 Unspecified dementia without behavioral disturbance: Secondary | ICD-10-CM | POA: Diagnosis not present

## 2024-07-20 DIAGNOSIS — R569 Unspecified convulsions: Secondary | ICD-10-CM | POA: Diagnosis not present

## 2024-07-20 DIAGNOSIS — R410 Disorientation, unspecified: Secondary | ICD-10-CM | POA: Diagnosis not present

## 2024-07-20 LAB — BASIC METABOLIC PANEL WITH GFR
Anion gap: 11 (ref 5–15)
BUN: 14 mg/dL (ref 8–23)
CO2: 24 mmol/L (ref 22–32)
Calcium: 9.5 mg/dL (ref 8.9–10.3)
Chloride: 104 mmol/L (ref 98–111)
Creatinine, Ser: 0.74 mg/dL (ref 0.44–1.00)
GFR, Estimated: >60 mL/min
Glucose, Bld: 116 mg/dL — ABNORMAL HIGH (ref 70–99)
Potassium: 3.5 mmol/L (ref 3.5–5.1)
Sodium: 139 mmol/L (ref 135–145)

## 2024-07-20 LAB — SYPHILIS: RPR W/REFLEX TO RPR TITER AND TREPONEMAL ANTIBODIES, TRADITIONAL SCREENING AND DIAGNOSIS ALGORITHM: RPR Ser Ql: NONREACTIVE

## 2024-07-20 MED ORDER — IOHEXOL 350 MG/ML SOLN
75.0000 mL | Freq: Once | INTRAVENOUS | Status: AC | PRN
  Administered 2024-07-20: 75 mL via INTRAVENOUS

## 2024-07-20 NOTE — Progress Notes (Signed)
 "                        PROGRESS NOTE        PATIENT DETAILS Name: Debbie Cardenas Age: 78 y.o. Sex: female Date of Birth: 1946/03/28 Admit Date: 07/17/2024 Admitting Physician Camila DELENA Ned, MD ERE:[id number], [place], [place]  Brief Summary: Patient is a 78 y.o.  female with history of presumed dementia (mild per family), A-fib, CAD-s/p CABG 2022-presented with right apparently worsening mental status (restlessness/agitation/hallucinations) x 2 months and frequent falls.  Significant events: 12/19>> admit to [facility]  Significant studies: 12/19>> CT head: No significant acute intracranial abnormality 12/19>> CT C-spine: No acute abnormality 12/19>> CT T-spine: No acute abnormality 12/19>> CT L-spine: No acute findings 12/19>> CXR: No PNA 12/19>> CT renal stone study: No hydronephrosis-no obstructing calculi. 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 12/21>> MRV brain: Poor study-no visible signal changes consistent with sinus thrombosis 12/21>> Spot EEG: No seizures. 12/21>> vitamin B12: 2815 (normal) 12/22>> CT venogram: No evidence of sinus thrombosis 12/22>> RPR: Nonreactive  Significant microbiology data: 12/19>> COVID/influenza/RSV PCR: Negative 12/19>> urine culture: No growth  Procedures: None  Consults: None  Subjective: Uneventful night-some mild episodes of restlessness but slept through the night.  Objective: Vitals: Blood pressure (!) 160/62, pulse 64, temperature 97.7 F (36.5 C), temperature source Oral, resp. rate 15, height 5' 5 (1.651 m), weight 63 kg, SpO2 98%.   Exam: Gen Exam: Sleepy but awake-answers some questions appropriately HEENT:atraumatic, normocephalic Chest: B/L clear to auscultation anteriorly CVS:S1S2 regular Abdomen:soft non tender, non distended Extremities:no  edema Neurology: Non focal-generalized weakness. Skin: no rash  Pertinent Labs/Radiology:    Latest Ref Rng & Units 07/19/2024    3:58 AM 07/18/2024    4:56 AM 07/17/2024    5:19 PM  CBC  WBC 4.0 - 10.5 K/uL 5.3  4.9  5.2   Hemoglobin 12.0 - 15.0 g/dL 87.8  87.9  87.5   Hematocrit 36.0 - 46.0 % 35.3  34.8  37.1   Platelets 150 - 400 K/uL 210  194  215     Lab Results  Component Value Date   NA 139 07/20/2024   K 3.5 07/20/2024   CL 104 07/20/2024   CO2 24 07/20/2024      Assessment/Plan: Acute metabolic encephalopathy Suspect this is probably related to dementia (probable/undiagnosed)-no reversible causes found at this point-not felt to have UTI.  See workup above Per spouse-has had rapid decline in mentation for the past 2 months-unsure if there is any other pathology on top of suspected dementia.  Have asked neurology to evaluate and provide second opinion. Since continues to have delirium-mostly at night-continue Seroquel /melatonin Maintain delirium precautions.  Hypertensive urgency BP better controlled but still fluctuating Continue metoprolol /amlodipine  Reassess 12/23 and adjust accordingly.  ?  Cerebral venous sinus thrombosis Ruled out at this point-CT venogram negative-already on chronic anticoagulation.  HLD Lipitor/Zetia   PAF Resume metoprolol  Telemetry monitoring On Eliquis   CAD s/p CABG Doubt Anginal symptoms Continue beta-blocker/statin Suspect not on ASA-has on Eliquis   History of vitamin B12 deficiency Continue B12 supplementation B12 level stable.  Frequent falls Check orthostatics PT/OT eval  ?  Undiagnosed dementia Spouse acknowledges several years of gradually declining short-term memory issues-claims that this occurred after she had 3 surgeries in the span of 3 years-however has had a rapid decline in the past 2 months-no reversible  causes found-see above regarding plans to consult neurology  Code status:   Code Status: Full Code    DVT Prophylaxis: apixaban  (ELIQUIS ) tablet 5 mg    Family Communication: Spouse/Son at bedside 12/22   Disposition Plan: Status is: Inpatient Remains inpatient appropriate because: Severity of illness   Planned Discharge Destination:Home health   Diet: Diet Order             Diet Heart Room service appropriate? Yes; Fluid consistency: Thin  Diet effective now                     Antimicrobial agents: Anti-infectives (From admission, onward)    Start     Dose/Rate Route Frequency Ordered Stop   07/18/24 2200  cefTRIAXone  (ROCEPHIN ) 1 g in sodium chloride  0.9 % 100 mL IVPB        1 g 200 mL/hr over 30 Minutes Intravenous Every 24 hours 07/18/24 0709 07/19/24 2234   07/17/24 2030  cefTRIAXone  (ROCEPHIN ) 1 g in sodium chloride  0.9 % 100 mL IVPB        1 g 200 mL/hr over 30 Minutes Intravenous  Once 07/17/24 2029 07/17/24 2306        MEDICATIONS: Scheduled Meds:  amLODipine   10 mg Oral Daily   apixaban   5 mg Oral BID   atorvastatin   10 mg Oral Daily   cholecalciferol   2,000 Units Oral Daily   cyanocobalamin   1,000 mcg Oral Daily   ezetimibe   10 mg Oral Daily   feeding supplement  237 mL Oral BID BM   folic acid   1 mg Oral Daily   melatonin  5 mg Oral QHS   metoprolol  succinate  12.5 mg Oral Daily   multivitamin with minerals  1 tablet Oral Daily   QUEtiapine   50 mg Oral QHS   Continuous Infusions:   PRN Meds:.acetaminophen  **OR** acetaminophen , albuterol , haloperidol  lactate, hydrALAZINE , ondansetron  **OR** ondansetron  (ZOFRAN ) IV, ziprasidone    I have personally reviewed following labs and imaging studies  LABORATORY DATA: CBC: Recent Labs  Lab 07/17/24 1719 07/18/24 0456 07/19/24 0358  WBC 5.2 4.9 5.3  NEUTROABS 2.9  --  3.1  HGB 12.4 12.0 12.1  HCT 37.1 34.8* 35.3*  MCV 93.7 90.9 91.0  PLT 215 194 210    Basic Metabolic Panel: Recent Labs  Lab 07/17/24 1719 07/18/24 0456 07/19/24 0358 07/20/24 0350  NA 141 141 141 139  K 3.7  3.4* 3.3* 3.5  CL 105 107 106 104  CO2 27 26 26 24   GLUCOSE 93 100* 87 116*  BUN 23 17 12 14   CREATININE 0.88 0.77 0.70 0.74  CALCIUM  9.7 9.4 9.1 9.5  MG 2.1  --  2.1  --   PHOS  --   --  3.4  --     GFR: Estimated Creatinine Clearance: 53 mL/min (by C-G formula based on SCr of 0.74 mg/dL).  Liver Function Tests: Recent Labs  Lab 07/17/24 1719 07/18/24 0456 07/19/24 0358  AST 22 20  --   ALT 11 11  --   ALKPHOS 67 61  --   BILITOT 0.7 0.6  --   PROT 6.6 6.3*  --   ALBUMIN  4.2 4.0 3.7   No results for input(s): LIPASE, AMYLASE in the last 168 hours. Recent Labs  Lab 07/17/24 2228  AMMONIA 35    Coagulation Profile: No results for input(s): INR, PROTIME in the last 168 hours.  Cardiac Enzymes: No results for input(s): CKTOTAL, CKMB, CKMBINDEX, TROPONINI  in the last 168 hours.  BNP (last 3 results) No results for input(s): PROBNP in the last 8760 hours.  Lipid Profile: No results for input(s): CHOL, HDL, LDLCALC, TRIG, CHOLHDL, LDLDIRECT in the last 72 hours.  Thyroid  Function Tests: Recent Labs    07/17/24 2228  TSH 0.396    Anemia Panel: Recent Labs    07/19/24 1013  VITAMINB12 2,815*    Urine analysis:    Component Value Date/Time   COLORURINE YELLOW 07/17/2024 1820   APPEARANCEUR CLOUDY (A) 07/17/2024 1820   LABSPEC 1.015 07/17/2024 1820   PHURINE 7.0 07/17/2024 1820   GLUCOSEU NEGATIVE 07/17/2024 1820   HGBUR LARGE (A) 07/17/2024 1820   BILIRUBINUR NEGATIVE 07/17/2024 1820   KETONESUR NEGATIVE 07/17/2024 1820   PROTEINUR NEGATIVE 07/17/2024 1820   NITRITE NEGATIVE 07/17/2024 1820   LEUKOCYTESUR SMALL (A) 07/17/2024 1820    Sepsis Labs: Lactic Acid, Venous No results found for: LATICACIDVEN  MICROBIOLOGY: Recent Results (from the past 240 hours)  Resp panel by RT-PCR (RSV, Flu A&B, Covid) Anterior Nasal Swab     Status: None   Collection Time: 07/17/24  5:20 PM   Specimen: Anterior Nasal Swab  Result  Value Ref Range Status   SARS Coronavirus 2 by RT PCR NEGATIVE NEGATIVE Final   Influenza A by PCR NEGATIVE NEGATIVE Final   Influenza B by PCR NEGATIVE NEGATIVE Final    Comment: (NOTE) The Xpert Xpress SARS-CoV-2/FLU/RSV plus assay is intended as an aid in the diagnosis of influenza from Nasopharyngeal swab specimens and should not be used as a sole basis for treatment. Nasal washings and aspirates are unacceptable for Xpert Xpress SARS-CoV-2/FLU/RSV testing.  Fact Sheet for Patients: bloggercourse.com  Fact Sheet for Healthcare Providers: seriousbroker.it  This test is not yet approved or cleared by the [place]  FDA and has been authorized for detection and/or diagnosis of SARS-CoV-2 by FDA under an Emergency Use Authorization (EUA). This EUA will remain in effect (meaning this test can be used) for the duration of the COVID-19 declaration under Section 564(b)(1) of the Act, 21 U.S.C. section 360bbb-3(b)(1), unless the authorization is terminated or revoked.     Resp Syncytial Virus by PCR NEGATIVE NEGATIVE Final    Comment: (NOTE) Fact Sheet for Patients: bloggercourse.com  Fact Sheet for Healthcare Providers: seriousbroker.it  This test is not yet approved or cleared by the [place]  FDA and has been authorized for detection and/or diagnosis of SARS-CoV-2 by FDA under an Emergency Use Authorization (EUA). This EUA will remain in effect (meaning this test can be used) for the duration of the COVID-19 declaration under Section 564(b)(1) of the Act, 21 U.S.C. section 360bbb-3(b)(1), unless the authorization is terminated or revoked.  Performed at [facility], [address] [place], [place] [address]   Urine Culture     Status: None   Collection Time: 07/17/24  8:30 PM   Specimen: Urine, Clean Catch  Result Value Ref Range Status   Specimen Description  URINE, CLEAN CATCH  Final   Special Requests NONE  Final   Culture   Final    NO GROWTH Performed at [facility], [address] [place], [place] [address]    Report Status 07/19/2024 FINAL  Final    RADIOLOGY STUDIES/RESULTS: CT VENOGRAM HEAD Result Date: 07/20/2024 EXAM: CT VENOGRAM WITH CONTRAST 07/20/2024 10:20:29 AM TECHNIQUE: CT venogram of the head/brain was performed with the administration of intravenous contrast (iohexol  (OMNIPAQUE ) 350 MG/ML injection 75 mL IOHEXOL  350 MG/ML SOLN).  Multiplanar reformatted images are provided for review. MIP images are provided for review. Automated exposure control, iterative reconstruction, and/or weight based adjustment of the mA/kV was utilized to reduce the radiation dose to as low as reasonably achievable. COMPARISON: MRI brain 07/18/2024 and CT head 07/17/2024. CLINICAL HISTORY: Dural venous sinus thrombosis suspected. FINDINGS: BRAIN/VENTRICLES: No acute intracranial hemorrhage. No extra axial fluid collection. Gray-white differentiation is maintained. No mass effect or midline shift. No hydrocephalus. There is overall unchanged mild scattered white matter hypodensities which are nonspecific but most commonly represent chronic microvascular ischemic changes. ORBITS: Orbits demonstrate bilateral lens replacement. SINUSES AND MASTOIDS: No acute abnormality. SOFT TISSUES AND SKULL: No acute abnormality. CT VENOGRAM: No evidence of dural venous sinus or cerebral vein thrombosis. No significant stenosis. IMPRESSION: 1. No evidence of dural venous sinus or cerebral vein thrombosis. 2. No substantial change since 07/17/2024. Electronically signed by: Prentice Spade MD 07/20/2024 11:53 AM EST RP Workstation: [id number]   EEG adult Result Date: 07/20/2024 Matthews Elida HERO, MD     07/20/2024  7:18 AM Routine EEG Report MAJA MCCAFFERY is a 78 y.o. female with a history of altered mental status who is undergoing an EEG to evaluate for seizures.  Report: This EEG was acquired with electrodes placed according to the International 10-20 electrode system (including Fp1, Fp2, F3, F4, C3, C4, P3, P4, O1, O2, T3, T4, T5, T6, A1, A2, Fz, Cz, Pz). The following electrodes were missing or displaced: none. The occipital dominant rhythm was 6 Hz. This activity is reactive to stimulation. Drowsiness was manifested by background fragmentation; deeper stages of sleep were identified by K complexes and sleep spindles. There was no focal slowing. There were occasional triphasic waves. There were no interictal epileptiform discharges. There were no electrographic seizures identified. There was no abnormal response to photic stimulation or hyperventilation. Impression and clinical correlation: This EEG was obtained while awake and asleep and is abnormal due to moderate diffuse slowing indicative of global cerebral dysfunction. Triphasic waves are indicative of metabolic encephalopathy. Epileptiform abnormalities were not seen during this recording. Elida Matthews, MD [facility] [phone] If 7pm- 7am, please page neurology on call as listed in AMION.   MR MRV HEAD WO CM Result Date: 07/19/2024 CLINICAL DATA:  Initial evaluation for possible dural venous sinus thrombosis, question on prior MRI. EXAM: MR VENOGRAM HEAD WITHOUT CONTRAST TECHNIQUE: Angiographic images of the intracranial venous structures were acquired using MRV technique without intravenous contrast. COMPARISON:  Prior MRI from 07/18/2024. FINDINGS: Examination is technically limited and nondiagnostic as the patient was unable to tolerate the exam. Intermittent scalp images only were obtained. No visible signal changes of dural venous sinus thrombosis on this limited exam. IMPRESSION: Technically limited truncated exam due to the patient's inability to tolerate the study. Scout imaging only obtained. No visible signal changes of dural venous sinus thrombosis on this limited exam. If there  remains clinical suspicion for possible occult dural venous sinus thrombosis, consideration of follow-up CT venogram could be performed for further evaluation as warranted and as the patient can tolerate. Electronically Signed   By: Morene Hoard M.D.   On: 07/19/2024 18:24   MR BRAIN WO CONTRAST Result Date: 07/18/2024 CLINICAL DATA:  Initial evaluation for acute mental status change, unknown cause. EXAM: MRI HEAD WITHOUT CONTRAST TECHNIQUE: Multiplanar, multiecho pulse sequences of the brain and surrounding structures were obtained without intravenous contrast. COMPARISON:  CT from 07/17/2024. FINDINGS: Brain: Examination mildly degraded by motion artifact. Cerebral volume within normal limits. Patchy T2/FLAIR hyperintensity  involving the periventricular and deep white matter both cerebral hemispheres, most characteristic of chronic microvascular ischemic disease, mild for age. No evidence for acute or subacute infarct. No areas of chronic cortical infarction. No acute or chronic intracranial blood products. No mass lesion, midline shift or mass effect. No hydrocephalus or extra-axial fluid collection. Pituitary gland within normal limits. Vascular: Major intracranial arterial vascular flow voids are maintained. Intermittent FLAIR signal abnormality noted about the posterior aspect of the superior sagittal sinus and transverse at the sigmoid sinuses bilaterally (series 9, image 6) for example. No signal changes seen on corresponding sequences. Overall appearance is favored to be artifactual in nature. Skull and upper cervical spine: Craniocervical junction within normal limits. Bone marrow signal intensity normal. No scalp soft tissue abnormality. Sinuses/Orbits: Prior bilateral ocular lens replacement. Paranasal sinuses are largely clear. No significant mastoid effusion. Other: None. IMPRESSION: 1. Apparent FLAIR signal abnormality involving the transverse and sigmoid sinuses bilaterally. While this  finding is favored to be artifactual in nature on this somewhat motion degraded exam, possible dural venous sinus thrombosis could also potentially have this appearance, and could be considered in the correct clinical setting. Correlation with dedicated MRV suggested as clinically warranted. 2. No other acute intracranial abnormality. 3. Mild chronic microvascular ischemic disease for age. Electronically Signed   By: Morene Hoard M.D.   On: 07/18/2024 23:12     LOS: 3 days   Donalda Applebaum, MD  [facility]    To contact the attending provider between 7A-7P or the covering provider during after hours 7P-7A, please log into the web site [email or web address] and access using universal [place] password for that web site. If you do not have the password, please call the hospital operator.  07/20/2024, 1:24 PM    "

## 2024-07-20 NOTE — Discharge Instructions (Addendum)
 Information on my medicine - ELIQUIS  (apixaban )  This medication education was reviewed with me or my healthcare representative as part of my discharge preparation.    Why was Eliquis  prescribed for you? Eliquis  was prescribed for you to reduce the risk of a blood clot forming that can cause a stroke if you have a medical condition called atrial fibrillation (a type of irregular heartbeat).  What do You need to know about Eliquis  ? Take your Eliquis  TWICE DAILY - one tablet in the morning and one tablet in the evening with or without food. If you have difficulty swallowing the tablet whole please discuss with your pharmacist how to take the medication safely.  Take Eliquis  exactly as prescribed by your doctor and DO NOT stop taking Eliquis  without talking to the doctor who prescribed the medication.  Stopping may increase your risk of developing a stroke.  Refill your prescription before you run out.  After discharge, you should have regular check-up appointments with your healthcare provider that is prescribing your Eliquis .  In the future your dose may need to be changed if your kidney function or weight changes by a significant amount or as you get older.  What do you do if you miss a dose? If you miss a dose, take it as soon as you remember on the same day and resume taking twice daily.  Do not take more than one dose of ELIQUIS  at the same time to make up a missed dose.  Important Safety Information A possible side effect of Eliquis  is bleeding. You should call your healthcare provider right away if you experience any of the following: Bleeding from an injury or your nose that does not stop. Unusual colored urine (red or dark brown) or unusual colored stools (red or black). Unusual bruising for unknown reasons. A serious fall or if you hit your head (even if there is no bleeding).  Some medicines may interact with Eliquis  and might increase your risk of bleeding or clotting  while on Eliquis . To help avoid this, consult your healthcare provider or pharmacist prior to using any new prescription or non-prescription medications, including herbals, vitamins, non-steroidal anti-inflammatory drugs (NSAIDs) and supplements.  This website has more information on Eliquis  (apixaban ): http://[email or web address].eliquis .com/eliquis dena Follow with Primary MD Juliane Che, PA in 7 days   Get CBC, CMP, Magnesium , 2 view Chest X ray -  checked next visit with your primary MD or SNF MD    Activity: As tolerated with Full fall precautions use walker/cane & assistance as needed  Disposition Home   Diet: Heart Healthy  with feeding assistance and aspiration precautions.  Special Instructions: If you have smoked or chewed Tobacco  in the last 2 yrs please stop smoking, stop any regular Alcohol  and or any Recreational drug use.  On your next visit with your primary care physician please Get Medicines reviewed and adjusted.  Please request your Prim.MD to go over all Hospital Tests and Procedure/Radiological results at the follow up, please get all Hospital records sent to your Prim MD by signing hospital release before you go home.  If you experience worsening of your admission symptoms, develop shortness of breath, life threatening emergency, suicidal or homicidal thoughts you must seek medical attention immediately by calling 911 or calling your MD immediately  if symptoms less severe.  You Must read complete instructions/literature along with all the possible adverse reactions/side effects for all the Medicines you take and that have been prescribed to you. Take any new  Medicines after you have completely understood and accpet all the possible adverse reactions/side effects.   Do not drive when taking Pain medications.  Do not take more than prescribed Pain, Sleep and Anxiety Medications  Wear Seat belts while driving.

## 2024-07-20 NOTE — Plan of Care (Signed)
   Problem: Health Behavior/Discharge Planning: Goal: Ability to manage health-related needs will improve Outcome: Progressing   Problem: Clinical Measurements: Goal: Ability to maintain clinical measurements within normal limits will improve Outcome: Progressing Goal: Will remain free from infection Outcome: Progressing Goal: Diagnostic test results will improve Outcome: Progressing

## 2024-07-20 NOTE — Procedures (Signed)
 Routine EEG Report  Debbie Cardenas is a 78 y.o. female with a history of altered mental status who is undergoing an EEG to evaluate for seizures.  Report: This EEG was acquired with electrodes placed according to the International 10-20 electrode system (including Fp1, Fp2, F3, F4, C3, C4, P3, P4, O1, O2, T3, T4, T5, T6, A1, A2, Fz, Cz, Pz). The following electrodes were missing or displaced: none.  The occipital dominant rhythm was 6 Hz. This activity is reactive to stimulation. Drowsiness was manifested by background fragmentation; deeper stages of sleep were identified by K complexes and sleep spindles. There was no focal slowing. There were occasional triphasic waves. There were no interictal epileptiform discharges. There were no electrographic seizures identified. There was no abnormal response to photic stimulation or hyperventilation.   Impression and clinical correlation: This EEG was obtained while awake and asleep and is abnormal due to moderate diffuse slowing indicative of global cerebral dysfunction. Triphasic waves are indicative of metabolic encephalopathy. Epileptiform abnormalities were not seen during this recording.  Debbie Ross, MD [facility] [phone]  If 7pm- 7am, please page neurology on call as listed in AMION.

## 2024-07-20 NOTE — Progress Notes (Signed)
 SLP Cancellation Note  Patient Details Name: Debbie Cardenas MRN: [id number] DOB: 10-Nov-1945   Cancelled treatment:       Reason Eval/Treat Not Completed: Fatigue/lethargy limiting ability to participate. SLP will f/u as able.    Damien Blumenthal, M.A., CCC-SLP Speech Language Pathology, Acute Rehabilitation Services  Secure Chat preferred [phone]  07/20/2024, 2:49 PM

## 2024-07-20 NOTE — Progress Notes (Signed)
 Physical Therapy Treatment Patient Details Name: Debbie Cardenas MRN: [id number] DOB: [birth year] Today's Date: 07/20/2024   History of Present Illness Pt is a 78 y/o F who presented to [facility] ED for cognitive decline per husband. Pt admitted for workup of AMS. PMHx: recent cognitive decline without formal dx of dementia, anxiety, HLD, HTN, L femoral neck fx (2020), R TKA (2024).    PT Comments  Pt received in supine and agreeable to session. Pt demonstrates good participation with increased cues for sequencing and awareness throughout session. Pt only opens eyes with cues, however appears to not be able to see immediate surroundings with pt reaching out for UE support despite RW being in front of her and pt reports not seeing recliner next to her despite cues. Pt able to perform short gait distances in the room with improved stability and sequencing with HHA vs RW due to poor management. Pt continues to benefit from PT services to progress toward functional mobility goals.    If plan is discharge home, recommend the following: Supervision due to cognitive status;Direct supervision/assist for medications management;Direct supervision/assist for financial management;Two people to help with walking and/or transfers;A lot of help with bathing/dressing/bathroom;Assistance with cooking/housework;Assist for transportation;Help with stairs or ramp for entrance   Can travel by private vehicle        Equipment Recommendations  Wheelchair (measurements PT)    Recommendations for Other Services       Precautions / Restrictions Precautions Precautions: Fall Recall of Precautions/Restrictions: Impaired Restrictions Weight Bearing Restrictions Per Provider Order: No     Mobility  Bed Mobility Overal bed mobility: Needs Assistance Bed Mobility: Supine to Sit     Supine to sit: Max assist, Used rails, +2 for physical assistance     General bed mobility comments: Assist for BLE advancement and  trunk elevation, but pt demonstrates some initiation. Max cues for sequencing and awareness    Transfers Overall transfer level: Needs assistance Equipment used: 2 person hand held assist Transfers: Sit to/from Stand, Bed to chair/wheelchair/BSC Sit to Stand: Mod assist, +2 physical assistance   Step pivot transfers: Mod assist, +2 physical assistance       General transfer comment: Cues for hand placement and mod A +2 for power up from EOB and recliner. Pivot to recliner with mod A +2 and max cues for sequencing and step by step directions due to decreased awareness    Ambulation/Gait Ambulation/Gait assistance: Mod assist, +2 physical assistance, +2 safety/equipment Gait Distance (Feet): 20 Feet (+10) Assistive device: 2 person hand held assist, Rolling walker (2 wheels) Gait Pattern/deviations: Step-through pattern, Decreased stride length, Trunk flexed, Narrow base of support Gait velocity: decreased     General Gait Details: Pt able to ambulate forward and backward a short distance multiple times with 2 HHA initially progressing to RW. Pt demonstrates poor RW management requiring increased cues, but less physical assist. Pt demonstrates increased instability and fear with 1 HHA and reaches for BUE support.   Stairs             Wheelchair Mobility     Tilt Bed    Modified Rankin (Stroke Patients Only)       Balance Overall balance assessment: Needs assistance Sitting-balance support: Bilateral upper extremity supported, Feet supported Sitting balance-Leahy Scale: Poor Sitting balance - Comments: CGA sitting EOB   Standing balance support: Bilateral upper extremity supported, During functional activity, Reliant on assistive device for balance Standing balance-Leahy Scale: Poor Standing balance comment: reliant on BUE support  Communication Communication Communication: Impaired Factors Affecting Communication:  Difficulty expressing self;Reduced clarity of speech  Cognition Arousal: Alert, Lethargic (eyes remain closed for most of session)     PT - Cognitive impairments: History of cognitive impairments                       PT - Cognition Comments: Question visual impairment vs impaired awareness with pt reaching out for UE support and requiring max cues for location of RW and recliner despite eyes open Following commands: Impaired Following commands impaired: Follows one step commands inconsistently, Follows one step commands with increased time    Cueing Cueing Techniques: Verbal cues, Gestural cues, Tactile cues, Visual cues  Exercises      General Comments        Pertinent Vitals/Pain Pain Assessment Pain Assessment: Faces Faces Pain Scale: No hurt     PT Goals (current goals can now be found in the care plan section) Acute Rehab PT Goals Patient Stated Goal: Husband reports to take her home and keep her safely mobile PT Goal Formulation: With family Time For Goal Achievement: 08/02/24 Progress towards PT goals: Progressing toward goals    Frequency    Min 2X/week       AM-PAC PT 6 Clicks Mobility   Outcome Measure  Help needed turning from your back to your side while in a flat bed without using bedrails?: A Lot Help needed moving from lying on your back to sitting on the side of a flat bed without using bedrails?: A Lot Help needed moving to and from a bed to a chair (including a wheelchair)?: A Lot Help needed standing up from a chair using your arms (e.g., wheelchair or bedside chair)?: A Lot Help needed to walk in hospital room?: A Lot Help needed climbing 3-5 steps with a railing? : Total 6 Click Score: 11    End of Session Equipment Utilized During Treatment: Gait belt Activity Tolerance: Patient tolerated treatment well Patient left: with call bell/phone within reach;in chair (Pt's son returning shortly) Nurse Communication: Mobility  status;Other (comment) (No chair alarm box in room) PT Visit Diagnosis: Difficulty in walking, not elsewhere classified (R26.2);Muscle weakness (generalized) (M62.81);Other abnormalities of gait and mobility (R26.89);Unsteadiness on feet (R26.81)     Time: 9170-9148 PT Time Calculation (min) (ACUTE ONLY): 22 min  Charges:    $Therapeutic Activity: 8-22 mins PT General Charges $$ ACUTE PT VISIT: 1 Visit                    Darryle George, PTA Acute Rehabilitation Services Secure Chat Preferred  [phone]    Darryle George 07/20/2024, 10:07 AM

## 2024-07-20 NOTE — TOC Initial Note (Signed)
 Transition of Care [facility]) - Initial/Assessment Note    Patient Details  Name: Debbie Cardenas MRN: [id number] Date of Birth: 15-Aug-1945  Transition of Care [facility]) CM/SW Contact:    Landry DELENA Senters, RN Phone Number: 07/20/2024, 4:16 PM  Clinical Narrative:                 RR:[id number] history significant of Anxiety, HLD, HTN,  Recent Hx of progressive cognitive decline w/o formal diagnosis of dementia.  Patient is brought in by family as patient now with  acute progression of  cognitive decline over the last few days with  associated hallucinations   Patient lives with husband, who supports her at home. Patient is currently confused and unable to participate in conversation. Son is in the room.   Family will provide transportation home at d/c, has PCP, husband manages meds at home, DME reviewed.   Therapy rec for HHPT/OT and W/C. Son reports husband went home to rest and requested CM f/u on this tomorrow.   Continued medical workup.  CM will continue to follow.   Expected Discharge Plan:  (TBD) Barriers to Discharge: Continued Medical Work up   Patient Goals and CMS Choice            Expected Discharge Plan and Services       Living arrangements for the past 2 months: Single Family Home                                      Prior Living Arrangements/Services Living arrangements for the past 2 months: Single Family Home Lives with:: Self, Spouse Patient language and need for interpreter reviewed:: Yes Do you feel safe going back to the place where you live?: Yes      Need for Family Participation in Patient Care: Yes (Comment) Care giver support system in place?: Yes (comment) Current home services: DME (cane, rolling walker) Criminal Activity/Legal Involvement Pertinent to Current Situation/Hospitalization: No - Comment as needed  Activities of Daily Living   ADL Screening (condition at time of admission) Independently performs ADLs?: No Does the patient have  a NEW difficulty with bathing/dressing/toileting/self-feeding that is expected to last >3 days?: Yes (Initiates electronic notice to provider for possible OT consult) Does the patient have a NEW difficulty with getting in/out of bed, walking, or climbing stairs that is expected to last >3 days?: Yes (Initiates electronic notice to provider for possible PT consult) Does the patient have a NEW difficulty with communication that is expected to last >3 days?: Yes (Initiates electronic notice to provider for possible SLP consult) Is the patient deaf or have difficulty hearing?: Yes Does the patient have difficulty seeing, even when wearing glasses/contacts?: Yes Does the patient have difficulty concentrating, remembering, or making decisions?: Yes  Permission Sought/Granted                  Emotional Assessment Appearance:: Developmentally appropriate Attitude/Demeanor/Rapport:  (confused) Affect (typically observed): Restless Orientation: : Fluctuating Orientation (Suspected and/or reported Sundowners) (confused)   Psych Involvement: No (comment)  Admission diagnosis:  Visual hallucinations [R44.1] Change in mental status [R41.82] Acute cystitis with hematuria [N30.01] Fall in home, initial encounter [W19.XXXA, Y92.009] Altered mental status, unspecified altered mental status type [R41.82] Patient Active Problem List   Diagnosis Date Noted   Change in mental status 07/17/2024   Delirium 10/10/2022   Atrial fibrillation with RVR (HCC) 10/09/2022   Chest pain  10/09/2022   Elevated troponin 10/09/2022   Pulmonary edema 10/09/2022   Hypokalemia 10/09/2022   Normocytic anemia 10/09/2022   Memory loss 10/09/2022   Status post total right knee replacement 10/02/2022   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   Coronary artery disease 08/15/2020   Coronary artery 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 07/27/2020   HTN (hypertension) 07/27/2020   Hyperlipidemia  07/27/2020   Unilateral primary osteoarthritis, left knee 03/26/2019   Status post total replacement of left hip 08/28/2018   Hip fracture (HCC) 08/15/2018   Closed fracture of neck of left femur (HCC) 08/15/2018   PCP:  Juliane Che, PA Pharmacy:   [id number] [address], [place] - [address] [place] [address] Phone: [phone] Fax: [phone]     Social Drivers of Health (SDOH) Social History: SDOH Screenings   Food Insecurity: No Food Insecurity (07/18/2024)  Housing: Low Risk (07/18/2024)  Transportation Needs: No Transportation Needs (07/18/2024)  Utilities: Not At Risk (07/18/2024)  Financial Resource Strain: Low Risk (03/17/2024)   Received from [facility]  Physical Activity: Inactive (03/17/2024)   [facility]  Social Connections: Moderately Isolated (07/18/2024)  Stress: No Stress Concern Present (03/17/2024)   Received from [facility]  Tobacco Use: Medium Risk (07/18/2024)   SDOH Interventions:     Readmission Risk Interventions     No data to display

## 2024-07-20 NOTE — Consult Note (Signed)
 NEUROLOGY CONSULT NOTE   Date of service: July 20, 2024 Patient Name: Debbie Cardenas MRN:  [id number] DOB:  1946-04-30 Chief Complaint: confusion/encephalopathy Requesting Provider: Raenelle Donalda HERO, MD  History of Present Illness  Debbie Cardenas is a 78 y.o. female with hx of HTN, HLD, CAD, anxiety, afibb on eliquis , reported progressive decline in cognition without a formal diagnosis of dementia who presents with hallucinations and acute decline in mentation.  In the ED, noted to be hypertensive, UA with ? UTI and UCx negative. She had workup with CT Head which was non revealing. Mri brain with concern for CVST but CT Venogram head is negative. Encephalopathy labs with normal B12, folate, TSH, Ammonia. Routine EEG negative for epileptiform discharges.  Patient felt to have delirium in the setting of dementia. Neurology consulted for a second opinion.  Patient is asleep.  I spoke with her husband who was at the bedside.  Husband reports that he has noticed a decline over the course of a year.  He reports now he needs to set the close out for patient as she gets confused.  In the past, patient was able to make elaborate meals but now can barely cook or egg.  He is worried that she will get lost if she leaves the house by herself.  Over the last 6 weeks, she has not recognize her husband and thinks that he is a caretaker that was hired by her kids. She has good days and she has bad days.  He has noticed that over the last few weeks, she has been more asleep, more confused.  She would try to fold the sheets and then undo them right away.  Her ability to do things by herself, has taken a downturn.  On the day that he brought her to the ED, she was very confused.  She was hard to redirect.   ROS   Unable to ascertain due to patient is asleep, gets very upset upon trying to wake her up.  Past History   Past Medical History:  Diagnosis Date   Anxiety    Arthritis    knees   Closed  fracture of neck of left femur (HCC) 08/15/2018   Coronary artery disease    Hyperlipidemia    Hypertension    Pneumonia     Past Surgical History:  Procedure Laterality Date   APPENDECTOMY     CARDIAC CATHETERIZATION     CATARACT EXTRACTION, BILATERAL     CHOLECYSTECTOMY     CORONARY ARTERY BYPASS GRAFT N/A 08/15/2020   Procedure: CORONARY ARTERY BYPASS GRAFTING (CABG)X4. USING BILATERAL MAMMARY ARTERIES AND RIGHT GREATER SAPHENOUS VEIN HARVESTED ENDOSCOPICALLY.;  Surgeon: Lucas Dorise POUR, MD;  Location: [facility] OR;  Service: Open Heart Surgery;  Laterality: N/A;   EYE SURGERY     bilateral cataract removal   KNEE SURGERY     LEFT HEART CATH AND CORONARY ANGIOGRAPHY N/A 07/27/2020   Procedure: LEFT HEART CATH AND CORONARY ANGIOGRAPHY;  Surgeon: Jordan, Peter M, MD;  Location: [facility] INVASIVE CV LAB;  Service: Cardiovascular;  Laterality: N/A;   TEE WITHOUT CARDIOVERSION N/A 08/15/2020   Procedure: TRANSESOPHAGEAL ECHOCARDIOGRAM (TEE);  Surgeon: Lucas Dorise POUR, MD;  Location: [facility] OR;  Service: Open Heart Surgery;  Laterality: N/A;   TOTAL HIP ARTHROPLASTY Left 08/15/2018   Procedure: TOTAL HIP ARTHROPLASTY ANTERIOR APPROACH;  Surgeon: Vernetta Lonni GRADE, MD;  Location: [facility] ORS;  Service: Orthopedics;  Laterality: Left;   TOTAL KNEE ARTHROPLASTY Right 10/02/2022   Procedure:  RIGHT TOTAL KNEE ARTHROPLASTY;  Surgeon: Vernetta Lonni GRADE, MD;  Location: [facility] OR;  Service: Orthopedics;  Laterality: Right;    Family History: Family History  Problem Relation Age of Onset   Cancer Sister    Heart disease Sister    Heart attack Brother    Heart disease Brother        s/p CABG   Heart attack Father    Heart disease Sister    Heart attack Brother    Heart disease Brother     Social History  reports that she quit smoking about 10 years ago. Her smoking use included cigarettes. She has never used smokeless tobacco. She reports current alcohol use. She reports that she does not use  drugs.  Allergies[1]  Medications  Current Medications[2]  Vitals   Vitals:   07/20/24 0800 07/20/24 1214 07/20/24 1605 07/20/24 2042  BP: (!) 141/61 (!) 160/62 (!) 162/66 136/74  Pulse: 66 64 70 68  Resp: 17 15 (!) 21   Temp: 98 F (36.7 C) 97.7 F (36.5 C) 97.7 F (36.5 C) 97.9 F (36.6 C)  TempSrc: Oral Oral Axillary Axillary  SpO2:      Weight:      Height:        Body mass index is 23.13 kg/m.   Physical Exam   General: Laying comfortably in bed; in no acute distress.  HENT: Normal oropharynx and mucosa. Normal external appearance of ears and nose.  Neck: Supple, no pain or tenderness  CV: No JVD. No peripheral edema.  Pulmonary: Symmetric Chest rise. Normal respiratory effort.  Abdomen: Soft to touch, non-tender.  Ext: No cyanosis, edema, or deformity  Skin: No rash. Normal palpation of skin.   Musculoskeletal: Normal digits and nails by inspection. No clubbing.   Neurologic Examination  Mental status/Cognition: Eyes closed, oriented to self.  Does not answer any other orientation questions.  Very upset with my attempts to wake her up.  Swats my hand away and gets agitated and yells at me. Speech/language: Very limited speech output.  Coherent and no dysarthria noted. Cranial nerves:   CN II Pupils equal and reactive to light, did partially open her eyes and made brief eye contact.   CN III,IV,VI No gaze preference or deviation noted.   CN V Closes her eyes tight shut and resists eye opening.   CN VII Symmetric facial movement noted.   CN VIII Normal hearing to speech.   CN IX & X Protecting her airway at this time.   CN XI Head is midline.   CN XII Does not protrude tongue on command.   Sensory/motor:  Muscle bulk: Normal, tone normal. Localizes to the bilateral upper extremities.  Antigravity movement noted in bilateral uppers. Withdraws bilateral lower extremities to Babinski's reflex.  Coordination/Complex Motor:  Unable to assess, no obvious  ataxia noted.  Labs/Imaging/Neurodiagnostic studies   CBC:  Recent Labs  Lab July 26, 2024 1719 07/18/24 0456 07/19/24 0358  WBC 5.2 4.9 5.3  NEUTROABS 2.9  --  3.1  HGB 12.4 12.0 12.1  HCT 37.1 34.8* 35.3*  MCV 93.7 90.9 91.0  PLT 215 194 210   Basic Metabolic Panel:  Lab Results  Component Value Date   NA 139 07/20/2024   K 3.5 07/20/2024   CO2 24 07/20/2024   GLUCOSE 116 (H) 07/20/2024   BUN 14 07/20/2024   CREATININE 0.74 07/20/2024   CALCIUM  9.5 07/20/2024   GFRNONAA >60 07/20/2024   GFRAA 58 (L) 07/14/2020   Lipid  Panel:  Lab Results  Component Value Date   LDLCALC 89 08/21/2023   HgbA1c:  Lab Results  Component Value Date   HGBA1C 4.8 07/18/2024   Urine Drug Screen: No results found for: LABOPIA, COCAINSCRNUR, LABBENZ, AMPHETMU, THCU, LABBARB  Alcohol Level No results found for: [facility] INR  Lab Results  Component Value Date   INR 1.4 (H) 08/15/2020   APTT  Lab Results  Component Value Date   APTT 31 08/15/2020   AED levels: No results found for: PHENYTOIN, ZONISAMIDE, LAMOTRIGINE, LEVETIRACETA  CT Head without contrast(Personally reviewed): CTH was negative for a large hypodensity concerning for a large territory infarct or hyperdensity concerning for an ICH  MRI Brain(Personally reviewed): No acute abnormality noted.  Neurodiagnostics rEEG:   This EEG was obtained while awake and asleep and is abnormal due to moderate diffuse slowing indicative of global cerebral dysfunction. Triphasic waves are indicative of metabolic encephalopathy. Epileptiform abnormalities were not seen during this recording.   ASSESSMENT   AMARIANNA ABPLANALP is a 78 y.o. female with hx of HTN, HLD, CAD, anxiety, afibb on eliquis , reported progressive decline in cognition without a formal diagnosis of dementia who presents with hallucinations and acute decline in mentation.  In the ED, noted to be hypertensive, UA with ? UTI and UCx negative. She had workup  with CT Head which was non revealing. Mri brain with concern for CVST but CT Venogram head is negative. Encephalopathy labs with normal B12, folate, TSH, Ammonia. Routine EEG negative for epileptiform discharges.  Patient felt to have delirium in the setting of dementia. Neurology consulted for a second opinion.  Collateral history is provided by husband at the bedside and  it appears that patient has had a gradual decline over the last year, more rapid over the last few weeks.  Patient has been losing her ability to do ADLs gradually and has been dependent more on the husband more recently.  She is at a point where for the last 6 weeks, she thinks her husband is her caretaker that has been hired by her kids.  Overall, high suspicion for underlying dementia and delirium at this time in the setting of baseline demenia.  I do not think I will be able to do an accurate dementia evaluation in the hospital given her delirium.  Per husband, patient is scheduled for outpatient neurology visit that was set up by her PCP in late January 2026.  I would recommend keeping that appointment and recommend outpatient formal dementia evaluation.  RECOMMENDATIONS  - Thiamine  levels ordered and pending. Start thiamine  100mg  PO or IV daily once levels collected. -Neurology inpatient team will sign off.  Please feel free to contact us  with any questions or concerns. ______________________________________________________________________  I personally spent a total of 60 minutes in the care of the patient today including preparing to see the patient, getting/reviewing separately obtained history, performing a medically appropriate exam/evaluation, counseling and educating, placing orders, documenting clinical information in the EHR, independently interpreting results, communicating results, and coordinating care.   Signed, Avin Upperman, MD [facility]     [1]  Allergies Allergen Reactions    Tramadol  Itching    Pt itches the day after taking but can tolerate it   Ephedrine      My dentist told me to never let anyone give me this   Ace Inhibitors Cough  [2]  Current Facility-Administered Medications:    acetaminophen  (TYLENOL ) tablet 650 mg, 650 mg, Oral, Q6H PRN, 650 mg at 07/18/24 2029 **  OR** acetaminophen  (TYLENOL ) suppository 650 mg, 650 mg, Rectal, Q6H PRN, Debby Camila LABOR, MD   albuterol  (PROVENTIL ) (2.5 MG/3ML) 0.083% nebulizer solution 2.5 mg, 2.5 mg, Nebulization, Q2H PRN, Debby Camila LABOR, MD   amLODipine  (NORVASC ) tablet 10 mg, 10 mg, Oral, Daily, Ghimire, Donalda HERO, MD, 10 mg at 07/20/24 9089   apixaban  (ELIQUIS ) tablet 5 mg, 5 mg, Oral, BID, Debby Camila A, MD, 5 mg at 07/20/24 2206   atorvastatin  (LIPITOR) tablet 10 mg, 10 mg, Oral, Daily, Debby Camila A, MD, 10 mg at 07/20/24 9089   cholecalciferol  (VITAMIN D3) 25 MCG (1000 UNIT) tablet 2,000 Units, 2,000 Units, Oral, Daily, Debby Camila LABOR, MD, 2,000 Units at 07/20/24 0911   cyanocobalamin  (VITAMIN B12) tablet 1,000 mcg, 1,000 mcg, Oral, Daily, Debby Camila A, MD, 1,000 mcg at 07/20/24 9088   ezetimibe  (ZETIA ) tablet 10 mg, 10 mg, Oral, Daily, Debby Camila A, MD, 10 mg at 07/20/24 0910   feeding supplement (ENSURE PLUS HIGH PROTEIN) liquid 237 mL, 237 mL, Oral, BID BM, Ghimire, Donalda HERO, MD, 237 mL at 07/20/24 0912   folic acid  (FOLVITE ) tablet 1 mg, 1 mg, Oral, Daily, Debby Camila A, MD, 1 mg at 07/20/24 9087   haloperidol  lactate (HALDOL ) injection 5 mg, 5 mg, Intravenous, Q6H PRN, Howerter, Justin B, DO, 5 mg at 07/20/24 1842   hydrALAZINE  (APRESOLINE ) injection 10 mg, 10 mg, Intravenous, Q6H PRN, Debby Camila LABOR, MD   melatonin tablet 5 mg, 5 mg, Oral, QHS, Ghimire, Donalda HERO, MD, 5 mg at 07/20/24 2207   metoprolol  succinate (TOPROL -XL) 24 hr tablet 12.5 mg, 12.5 mg, Oral, Daily, Ghimire, Shanker M, MD, 12.5 mg at 07/20/24 9088   multivitamin with minerals tablet 1 tablet,  1 tablet, Oral, Daily, Debby Camila A, MD, 1 tablet at 07/20/24 9088   ondansetron  (ZOFRAN ) tablet 4 mg, 4 mg, Oral, Q6H PRN **OR** ondansetron  (ZOFRAN ) injection 4 mg, 4 mg, Intravenous, Q6H PRN, Debby Camila LABOR, MD   QUEtiapine  (SEROQUEL ) tablet 50 mg, 50 mg, Oral, QHS, Ghimire, Shanker M, MD, 50 mg at 07/20/24 2207   ziprasidone  (GEODON ) injection 20 mg, 20 mg, Intramuscular, Once PRN, Howerter, Justin B, DO

## 2024-07-21 DIAGNOSIS — F0390 Unspecified dementia without behavioral disturbance: Secondary | ICD-10-CM | POA: Diagnosis not present

## 2024-07-21 DIAGNOSIS — R400 Somnolence: Secondary | ICD-10-CM | POA: Diagnosis not present

## 2024-07-21 DIAGNOSIS — R404 Transient alteration of awareness: Secondary | ICD-10-CM | POA: Diagnosis not present

## 2024-07-21 MED ORDER — THIAMINE MONONITRATE 100 MG PO TABS
100.0000 mg | ORAL_TABLET | Freq: Every day | ORAL | Status: DC
  Administered 2024-07-21 – 2024-07-28 (×8): 100 mg via ORAL
  Filled 2024-07-21 (×6): qty 1

## 2024-07-21 MED ORDER — NAPHAZOLINE-GLYCERIN 0.012-0.25 % OP SOLN
1.0000 [drp] | Freq: Four times a day (QID) | OPHTHALMIC | Status: DC | PRN
  Administered 2024-07-22: 2 [drp] via OPHTHALMIC
  Filled 2024-07-21: qty 15

## 2024-07-21 NOTE — Progress Notes (Signed)
 SLP Cancellation Note  Patient Details Name: Debbie Cardenas MRN: [id number] DOB: August 30, 1945   Cancelled treatment:       Reason Eval/Treat Not Completed: Pt admitted with AMS with all imaging negative for acute abnormality. Per neurology note, high suspicion for underlying dementia and delirium with plan to f/u with a neurologist as an OP for formal dementia evaluation. Will defer cognitive-linguistic evaluation at this time.    Damien Blumenthal, M.A., CCC-SLP Speech Language Pathology, Acute Rehabilitation Services  Secure Chat preferred [phone]  07/21/2024, 9:11 AM

## 2024-07-21 NOTE — Progress Notes (Signed)
 Physical Therapy Treatment Patient Details Name: Debbie Cardenas MRN: [id number] DOB: 11-Oct-1945 Today's Date: 07/21/2024   History of Present Illness Pt is a 78 y/o F who presented to [facility] ED for cognitive decline per husband. Pt admitted for workup of AMS. PMHx: recent cognitive decline without formal dx of dementia, anxiety, HLD, HTN, L femoral neck fx (2020), R TKA (2024).    PT Comments  Pt received in supine and attempting to urinate, but having difficulty. Pt assisted to [facility] and is able to urinate with assist for pericare. Pt requires grossly max A for bed mobility and transfers due to weakness, poor balance, and impaired cognition. Pt demonstrates decreased awareness and limited command following. Pt able to reach a full  upright stance from recliner with HHA and max cues. Pt's husband reports he is unable to provide current level of assist, so discharge recommendations updated after discussion with supervising PT Delon R. Pt continues to benefit from PT services to progress toward functional mobility goals.    If plan is discharge home, recommend the following: Supervision due to cognitive status;Direct supervision/assist for medications management;Direct supervision/assist for financial management;Two people to help with walking and/or transfers;A lot of help with bathing/dressing/bathroom;Assistance with cooking/housework;Assist for transportation;Help with stairs or ramp for entrance   Can travel by private vehicle     No  Equipment Recommendations  Wheelchair (measurements PT)    Recommendations for Other Services       Precautions / Restrictions Precautions Precautions: Fall Recall of Precautions/Restrictions: Impaired Restrictions Weight Bearing Restrictions Per Provider Order: No     Mobility  Bed Mobility Overal bed mobility: Needs Assistance Bed Mobility: Supine to Sit     Supine to sit: Max assist     General bed mobility comments: Assist for all aspects  due to limited command following    Transfers Overall transfer level: Needs assistance Equipment used: 1 person hand held assist Transfers: Sit to/from Stand, Bed to chair/wheelchair/BSC Sit to Stand: Max assist, +2 safety/equipment Stand pivot transfers: Max assist, +2 safety/equipment         General transfer comment: STS from EOB and BSC with max A and cues for hand placement. Pivot to [facility] with max A due to decreased command following and anxiety    Ambulation/Gait               General Gait Details: unable   Stairs             Wheelchair Mobility     Tilt Bed    Modified Rankin (Stroke Patients Only)       Balance Overall balance assessment: Needs assistance Sitting-balance support: Bilateral upper extremity supported, Feet supported Sitting balance-Leahy Scale: Poor Sitting balance - Comments: CGA sitting EOB   Standing balance support: Bilateral upper extremity supported, During functional activity, Reliant on assistive device for balance, Single extremity supported Standing balance-Leahy Scale: Poor Standing balance comment: reliant on external support                            Communication Communication Communication: Impaired Factors Affecting Communication: Difficulty expressing self;Reduced clarity of speech  Cognition Arousal: Alert, Lethargic Behavior During Therapy: Flat affect, Anxious   PT - Cognitive impairments: History of cognitive impairments                         Following commands: Impaired Following commands impaired: Follows one step commands inconsistently, Follows  one step commands with increased time    Cueing Cueing Techniques: Verbal cues, Gestural cues, Tactile cues, Visual cues  Exercises      General Comments General comments (skin integrity, edema, etc.): Spouse assisting as needed during session      Pertinent Vitals/Pain Pain Assessment Pain Assessment: Faces Faces Pain Scale:  Hurts a little bit Pain Location: generalized with mobility Pain Descriptors / Indicators: Discomfort Pain Intervention(s): Monitored during session     PT Goals (current goals can now be found in the care plan section) Acute Rehab PT Goals Patient Stated Goal: Husband reports to take her home and keep her safely mobile PT Goal Formulation: With family Time For Goal Achievement: 08/02/24 Progress towards PT goals: Progressing toward goals    Frequency    Min 2X/week       AM-PAC PT 6 Clicks Mobility   Outcome Measure  Help needed turning from your back to your side while in a flat bed without using bedrails?: A Lot Help needed moving from lying on your back to sitting on the side of a flat bed without using bedrails?: A Lot Help needed moving to and from a bed to a chair (including a wheelchair)?: A Lot Help needed standing up from a chair using your arms (e.g., wheelchair or bedside chair)?: A Lot Help needed to walk in hospital room?: Total Help needed climbing 3-5 steps with a railing? : Total 6 Click Score: 10    End of Session Equipment Utilized During Treatment: Gait belt Activity Tolerance: Other (comment) (limited by cognition) Patient left: with call bell/phone within reach;in chair;with family/visitor present Nurse Communication: Mobility status PT Visit Diagnosis: Difficulty in walking, not elsewhere classified (R26.2);Muscle weakness (generalized) (M62.81);Other abnormalities of gait and mobility (R26.89);Unsteadiness on feet (R26.81)     Time: 1343-1410 PT Time Calculation (min) (ACUTE ONLY): 27 min  Charges:    $Therapeutic Activity: 23-37 mins PT General Charges $$ ACUTE PT VISIT: 1 Visit                    Darryle George, PTA Acute Rehabilitation Services Secure Chat Preferred  [phone]    Darryle George 07/21/2024, 2:25 PM

## 2024-07-21 NOTE — Care Management Important Message (Signed)
 Important Message  Patient Details  Name: Debbie Cardenas MRN: [id number] Date of Birth: Sep 16, 1945   Important Message Given:  Yes - Medicare IM     Claretta Deed 07/21/2024, 12:21 PM

## 2024-07-21 NOTE — TOC Progression Note (Signed)
 Transition of Care [facility]) - Progression Note    Patient Details  Name: Debbie Cardenas MRN: [id number] Date of Birth: 1946-03-23  Transition of Care [facility]) CM/SW Contact  Landry DELENA Senters, RN Phone Number: 07/21/2024, 3:01 PM  Clinical Narrative:    PT rec. Has been updated from [facility] to SNF. CM reached out to OT for updated rec as well. CM spoke with patient's husband, and he prefers SNF for f/u therapy.   CM will continue to follow.    Expected Discharge Plan:  (TBD) Barriers to Discharge: Continued Medical Work up               Expected Discharge Plan and Services       Living arrangements for the past 2 months: Single Family Home                                       Social Drivers of Health (SDOH) Interventions SDOH Screenings   Food Insecurity: No Food Insecurity (07/18/2024)  Housing: Low Risk (07/18/2024)  Transportation Needs: No Transportation Needs (07/18/2024)  Utilities: Not At Risk (07/18/2024)  Financial Resource Strain: Low Risk (03/17/2024)   Received from [facility]  Physical Activity: Inactive (03/17/2024)   Received from [facility]  Social Connections: Moderately Isolated (07/18/2024)  Stress: No Stress Concern Present (03/17/2024)   Received from [facility]  Tobacco Use: Medium Risk (07/18/2024)    Readmission Risk Interventions     No data to display

## 2024-07-21 NOTE — NC FL2 (Signed)
 " [name]  MEDICAID FL2 LEVEL OF CARE FORM     IDENTIFICATION  Patient Name: Debbie Cardenas Birthdate: 06-25-1946 Sex: female Admission Date (Current Location): 07/17/2024  [facility] and [place] Number:  Producer, Television/film/video and Address:  [facility], [address] [place], [place] [address]      Provider Number: [id number]  Attending Physician Name and Address:  Lenon Marien LITTIE, MD  Relative Name and Phone Number:       Current Level of Care: Hospital Recommended Level of Care: Skilled Nursing Facility Prior Approval Number:    Date Approved/Denied:   PASRR Number: [id number]  Discharge Plan: SNF    Current Diagnoses: Patient Active Problem List   Diagnosis Date Noted   Change in mental status 07/17/2024   Delirium 10/10/2022   Atrial fibrillation with RVR (HCC) 10/09/2022   Chest pain 10/09/2022   Elevated troponin 10/09/2022   Pulmonary edema 10/09/2022   Hypokalemia 10/09/2022   Normocytic anemia 10/09/2022   Memory loss 10/09/2022   Status post total right knee replacement 10/02/2022   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   Coronary artery disease 08/15/2020   Coronary artery 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 07/27/2020   HTN (hypertension) 07/27/2020   Hyperlipidemia 07/27/2020   Unilateral primary osteoarthritis, left knee 03/26/2019   Status post total replacement of left hip 08/28/2018   Hip fracture (HCC) 08/15/2018   Closed fracture of neck of left femur (HCC) 08/15/2018    Orientation RESPIRATION BLADDER Height & Weight     Self  Normal Incontinent Weight: 139 lb (63 kg) Height:  5' 5 (165.1 cm)  BEHAVIORAL SYMPTOMS/MOOD NEUROLOGICAL BOWEL NUTRITION STATUS      Incontinent Diet (See dc summary)  AMBULATORY STATUS COMMUNICATION OF NEEDS Skin   Limited Assist Verbally Normal                       Personal Care Assistance Level of Assistance  Bathing, Feeding, Dressing  Bathing Assistance: Limited assistance Feeding assistance: Limited assistance Dressing Assistance: Limited assistance     Functional Limitations Info             SPECIAL CARE FACTORS FREQUENCY  PT (By licensed PT), OT (By licensed OT)     PT Frequency: 5x/week OT Frequency: 5x/week            Contractures Contractures Info: Not present    Additional Factors Info  Code Status, Allergies Code Status Info: Full Allergies Info: Tramadol , Ephedrine , Ace Inhibitors           Current Medications (07/21/2024):  This is the current hospital active medication list Current Facility-Administered Medications  Medication Dose Route Frequency Provider Last Rate Last Admin   acetaminophen  (TYLENOL ) tablet 650 mg  650 mg Oral Q6H PRN Thomas, Sara-Maiz A, MD   650 mg at 07/18/24 2029   Or   acetaminophen  (TYLENOL ) suppository 650 mg  650 mg Rectal Q6H PRN Debby Camila LABOR, MD       albuterol  (PROVENTIL ) (2.5 MG/3ML) 0.083% nebulizer solution 2.5 mg  2.5 mg Nebulization Q2H PRN Debby Camila LABOR, MD       amLODipine  (NORVASC ) tablet 10 mg  10 mg Oral Daily Ghimire, Shanker M, MD   10 mg at 07/21/24 1003   apixaban  (ELIQUIS ) tablet 5 mg  5 mg Oral BID Thomas, Sara-Maiz A, MD   5 mg at 07/21/24 1003   atorvastatin  (LIPITOR) tablet 10  mg  10 mg Oral Daily Thomas, Sara-Maiz A, MD   10 mg at 07/21/24 1002   cholecalciferol  (VITAMIN D3) 25 MCG (1000 UNIT) tablet 2,000 Units  2,000 Units Oral Daily Debby Camila LABOR, MD   2,000 Units at 07/21/24 1003   ezetimibe  (ZETIA ) tablet 10 mg  10 mg Oral Daily Thomas, Sara-Maiz A, MD   10 mg at 07/21/24 1003   feeding supplement (ENSURE PLUS HIGH PROTEIN) liquid 237 mL  237 mL Oral BID BM Raenelle Donalda HERO, MD   237 mL at 07/21/24 1006   folic acid  (FOLVITE ) tablet 1 mg  1 mg Oral Daily Thomas, Sara-Maiz A, MD   1 mg at 07/21/24 1002   haloperidol  lactate (HALDOL ) injection 5 mg  5 mg Intravenous Q6H PRN Howerter, Justin B, DO   5 mg at 07/20/24  1842   melatonin tablet 5 mg  5 mg Oral QHS Raenelle Donalda HERO, MD   5 mg at 07/20/24 2207   metoprolol  succinate (TOPROL -XL) 24 hr tablet 12.5 mg  12.5 mg Oral Daily Raenelle Donalda HERO, MD   12.5 mg at 07/21/24 1003   multivitamin with minerals tablet 1 tablet  1 tablet Oral Daily Debby Camila LABOR, MD   1 tablet at 07/21/24 1003   naphazoline-glycerin  (CLEAR EYES REDNESS) ophth solution 1-2 drop  1-2 drop Both Eyes QID PRN Lenon Marien CROME, MD       ondansetron  (ZOFRAN ) tablet 4 mg  4 mg Oral Q6H PRN Debby Camila LABOR, MD       Or   ondansetron  (ZOFRAN ) injection 4 mg  4 mg Intravenous Q6H PRN Debby Camila LABOR, MD       QUEtiapine  (SEROQUEL ) tablet 50 mg  50 mg Oral QHS Raenelle Donalda HERO, MD   50 mg at 07/20/24 2207   thiamine  (VITAMIN B1) tablet 100 mg  100 mg Oral Daily Lenon Marien CROME, MD   100 mg at 07/21/24 1212     Discharge Medications: Please see discharge summary for a list of discharge medications.  Relevant Imaging Results:  Relevant Lab Results:   Additional Information SSN    [id number]  [name], LCSW     "

## 2024-07-21 NOTE — Care Management Important Message (Signed)
 Important Message  Patient Details  Name: Debbie Cardenas MRN: [id number] Date of Birth: [birth year]   Important Message Given:  Yes - Medicare IM     Claretta Deed 07/21/2024, 12:19 PM

## 2024-07-21 NOTE — Progress Notes (Signed)
 "                        PROGRESS NOTE  Brief Summary: Debbie Cardenas is a 78 y.o.  female with history of presumed dementia (per family), A-fib, CAD-s/p CABG 2022-presented with falls and worsening mental status for 2 months. Overall workup has been unremarkable. Neurology has been consulted. Likely worsening dementia.   Consults: Neurology   Subjective: Pt reports no concerns or questions at this time. She is unable to orient to place or situation. She does not know who I am even with introducing myself.   Objective: Vitals: Blood pressure (!) 132/57, pulse 65, temperature 97.6 F (36.4 C), temperature source Axillary, resp. rate 19, height 5' 5 (1.651 m), weight 63 kg, SpO2 98%.   Exam: Gen Exam: resting comfortably and does not open her eyes when speaking-answers some questions appropriately Chest: B/L clear to auscultation anteriorly CVS:S1S2 regular Extremities:no edema Neurology: Non focal-generalized weakness. Unable to follow commands Skin: no rash  Pertinent Labs/Radiology:    Latest Ref Rng & Units 07/19/2024    3:58 AM 07/18/2024    4:56 AM 07/17/2024    5:19 PM  CBC  WBC 4.0 - 10.5 K/uL 5.3  4.9  5.2   Hemoglobin 12.0 - 15.0 g/dL 87.8  87.9  87.5   Hematocrit 36.0 - 46.0 % 35.3  34.8  37.1   Platelets 150 - 400 K/uL 210  194  215     Lab Results  Component Value Date   NA 139 07/20/2024   K 3.5 07/20/2024   CL 104 07/20/2024   CO2 24 07/20/2024    Assessment/Plan: Acute encephalopathy. Suspected dementia worsening. Metabolic workup reasonably ruled out more common causes. TSH normal. Head imaging normal.  - neurology consulted.    - starting thiamine .  - avoid sedating medication - supportive care - delirium precautions  Hypertensive urgency BP better controlled but still fluctuating Continue metoprolol /amlodipine   ?  Cerebral venous sinus thrombosis Ruled out at this point-CT venogram negative-already on chronic  anticoagulation.  HLD Lipitor/Zetia   PAF Resume metoprolol  Telemetry monitoring On Eliquis   CAD s/p CABG Doubt Anginal symptoms Continue beta-blocker/statin Suspect not on ASA-has on Eliquis   History of vitamin B12 deficiency Continue B12 supplementation B12 level stable.  Frequent falls Check orthostatics PT/OT eval  ?  Undiagnosed dementia Spouse acknowledges several years of gradually declining short-term memory issues-claims that this occurred after she had 3 surgeries in the span of 3 years, and sometimes does not recognize him. has had a rapid decline in the past 2 months-no reversible causes found. - neurology consulted - needs outpatient dementia evaluation   Code status:   Code Status: Full Code   DVT Prophylaxis: apixaban  (ELIQUIS ) tablet 5 mg    Family Communication: Spouse on phone  Disposition Plan: Status is: Inpatient Remains inpatient appropriate because: Severity of illness   Planned Discharge Destination:Home health   LABORATORY DATA: CBC: Recent Labs  Lab 07/17/24 1719 07/18/24 0456 07/19/24 0358  WBC 5.2 4.9 5.3  NEUTROABS 2.9  --  3.1  HGB 12.4 12.0 12.1  HCT 37.1 34.8* 35.3*  MCV 93.7 90.9 91.0  PLT 215 194 210    Basic Metabolic Panel: Recent Labs  Lab 07/17/24 1719 07/18/24 0456 07/19/24 0358 07/20/24 0350  NA 141 141 141 139  K 3.7 3.4* 3.3* 3.5  CL 105 107 106 104  CO2 27 26 26 24   GLUCOSE 93 100* 87 116*  BUN  23 17 12 14   CREATININE 0.88 0.77 0.70 0.74  CALCIUM  9.7 9.4 9.1 9.5  MG 2.1  --  2.1  --   PHOS  --   --  3.4  --    RADIOLOGY STUDIES/RESULTS: CT VENOGRAM HEAD Result Date: 07/20/2024 EXAM: CT VENOGRAM WITH CONTRAST 07/20/2024 10:20:29 AM TECHNIQUE: CT venogram of the head/brain was performed with the administration of intravenous contrast (iohexol  (OMNIPAQUE ) 350 MG/ML injection 75 mL IOHEXOL  350 MG/ML SOLN). Multiplanar reformatted images are provided for review. MIP images are provided for review.  Automated exposure control, iterative reconstruction, and/or weight based adjustment of the mA/kV was utilized to reduce the radiation dose to as low as reasonably achievable. COMPARISON: MRI brain 07/18/2024 and CT head 07/17/2024. CLINICAL HISTORY: Dural venous sinus thrombosis suspected. FINDINGS: BRAIN/VENTRICLES: No acute intracranial hemorrhage. No extra axial fluid collection. Gray-white differentiation is maintained. No mass effect or midline shift. No hydrocephalus. There is overall unchanged mild scattered white matter hypodensities which are nonspecific but most commonly represent chronic microvascular ischemic changes. ORBITS: Orbits demonstrate bilateral lens replacement. SINUSES AND MASTOIDS: No acute abnormality. SOFT TISSUES AND SKULL: No acute abnormality. CT VENOGRAM: No evidence of dural venous sinus or cerebral vein thrombosis. No significant stenosis. IMPRESSION: 1. No evidence of dural venous sinus or cerebral vein thrombosis. 2. No substantial change since 07/17/2024. Electronically signed by: Prentice Spade MD 07/20/2024 11:53 AM EST RP Workstation: [id number]   EEG adult Result Date: 07/20/2024 Matthews Elida HERO, MD     07/20/2024  7:18 AM Routine EEG Report Debbie Cardenas is a 78 y.o. female with a history of altered mental status who is undergoing an EEG to evaluate for seizures. Report: This EEG was acquired with electrodes placed according to the International 10-20 electrode system (including Fp1, Fp2, F3, F4, C3, C4, P3, P4, O1, O2, T3, T4, T5, T6, A1, A2, Fz, Cz, Pz). The following electrodes were missing or displaced: none. The occipital dominant rhythm was 6 Hz. This activity is reactive to stimulation. Drowsiness was manifested by background fragmentation; deeper stages of sleep were identified by K complexes and sleep spindles. There was no focal slowing. There were occasional triphasic waves. There were no interictal epileptiform discharges. There were no electrographic  seizures identified. There was no abnormal response to photic stimulation or hyperventilation. Impression and clinical correlation: This EEG was obtained while awake and asleep and is abnormal due to moderate diffuse slowing indicative of global cerebral dysfunction. Triphasic waves are indicative of metabolic encephalopathy. Epileptiform abnormalities were not seen during this recording. Elida Matthews, MD [facility] [phone] If 7pm- 7am, please page neurology on call as listed in AMION.     LOS: 4 days   [name], MD  [facility]   To contact the attending provider between 7A-7P or the covering provider during after hours 7P-7A, please log into the web site [email or web address] and access using universal [place] password for that web site. If you do not have the password, please call the hospital operator.  07/21/2024, 2:46 PM "

## 2024-07-21 NOTE — Plan of Care (Signed)

## 2024-07-22 DIAGNOSIS — R400 Somnolence: Secondary | ICD-10-CM

## 2024-07-22 DIAGNOSIS — F0390 Unspecified dementia without behavioral disturbance: Secondary | ICD-10-CM | POA: Diagnosis not present

## 2024-07-22 LAB — URINALYSIS, ROUTINE W REFLEX MICROSCOPIC
Bilirubin Urine: NEGATIVE
Glucose, UA: NEGATIVE mg/dL
Ketones, ur: NEGATIVE mg/dL
Leukocytes,Ua: NEGATIVE
Nitrite: NEGATIVE
Protein, ur: 30 mg/dL — AB
RBC / HPF: >50 RBC/hpf (ref 0–5)
Specific Gravity, Urine: 1.02 (ref 1.005–1.030)
pH: 5 (ref 5.0–8.0)

## 2024-07-22 NOTE — Progress Notes (Signed)
 Physical Therapy Treatment Patient Details Name: Debbie Cardenas MRN: [id number] DOB: April 13, 1946 Today's Date: 07/22/2024   History of Present Illness Pt is a 78 y/o F who presented to [facility] ED for cognitive decline per husband. Pt admitted for workup of AMS. PMHx: recent cognitive decline without formal dx of dementia, anxiety, HLD, HTN, L femoral neck fx (2020), R TKA (2024).    PT Comments  Pt restless and attempting to sit EOB upon arrival. Pt with significant confusion that is not redirectable throughout session.  Pt requires maxA for bed mobility to sit EOB and is steady upon sitting up. Pt initially requires modA for STS and progresses to minA with additional attempts. MaxA required upon standing with hand held assist x2 due to confusion and balance deficits. Pt attempts to ambulate towards sink/bathroom but becomes increasingly confused and is encouraged back to bed for safety. Pt not safe for chair transfer at this time due to level of confusion and restlessness. Pt would benefit from continued PT services focused on bed mobility, transfers, and gait as appropriate to promote safety with mobility.    If plan is discharge home, recommend the following: A lot of help with walking and/or transfers;A lot of help with bathing/dressing/bathroom;Assistance with cooking/housework;Direct supervision/assist for medications management;Direct supervision/assist for financial management;Assist for transportation;Help with stairs or ramp for entrance;Supervision due to cognitive status   Can travel by private vehicle     No  Equipment Recommendations  BSC/3in1;Rolling walker (2 wheels);Wheelchair (measurements PT);Wheelchair cushion (measurements PT)    Recommendations for Other Services       Precautions / Restrictions Precautions Precautions: Fall Recall of Precautions/Restrictions: Impaired Restrictions Weight Bearing Restrictions Per Provider Order: No     Mobility  Bed Mobility Overal  bed mobility: Needs Assistance Bed Mobility: Supine to Sit, Sit to Supine     Supine to sit: Max assist Sit to supine: Max assist   General bed mobility comments: Pt does not follow cues for sequencing to get in and out of bed. MaxA for LE and trunk positioning.    Transfers Overall transfer level: Needs assistance Equipment used: 2 person hand held assist Transfers: Sit to/from Stand Sit to Stand: Min assist, Mod assist           General transfer comment: Pt initially requiring modA to complete stance, STS x3 completed throughout session and improved to minA to complete transfer. Pt maxA for balance upon standing, hand held assist with husband and PT, frequently pushing away at hand held assist and support on gait belt.    Ambulation/Gait Ambulation/Gait assistance: Max assist, +2 physical assistance Gait Distance (Feet): 5 Feet Assistive device: 2 person hand held assist Gait Pattern/deviations: Step-to pattern, Decreased step length - right, Decreased step length - left, Staggering left, Staggering right, Drifts right/left, Narrow base of support, Shuffle   Gait velocity interpretation: <1.31 ft/sec, indicative of household ambulator   General Gait Details: Pt with very unsteady, shuffled steps in room when attempting to walk to sink. Pt impulsively stands and attempts to ambulate x2 during session and requires significant cuing to use sink for support or return to bed. Pt fatigues quickly when attempting to ambulate and requires additional support to return to bed safely.   Stairs             Wheelchair Mobility     Tilt Bed    Modified Rankin (Stroke Patients Only)       Balance Overall balance assessment: Needs assistance Sitting-balance support: Bilateral upper  extremity supported Sitting balance-Leahy Scale: Good Sitting balance - Comments: Pt demonstrates good sitting balance while EOB.   Standing balance support: Bilateral upper extremity  supported Standing balance-Leahy Scale: Poor Standing balance comment: Pt unsteady in standing, requiring hand held assist x2. Level of confusion and difficulty redirecting seem to be a big component in balance deficits.                            Communication Communication Communication: No apparent difficulties  Cognition Arousal: Alert Behavior During Therapy: Restless, Agitated, Impulsive   PT - Cognitive impairments: History of cognitive impairments, Awareness, Safety/Judgement, Problem solving, Sequencing, Initiation, Attention                       PT - Cognition Comments: Pt speaking nonsensibly throughout session, unable to be redirected. Husband present and assists with directing patient as able. Following commands: Impaired Following commands impaired: Follows one step commands inconsistently    Cueing Cueing Techniques: Verbal cues, Tactile cues, Visual cues  Exercises      General Comments General comments (skin integrity, edema, etc.): VSS throughout. No new skin abnormalities noted.      Pertinent Vitals/Pain Pain Assessment Pain Assessment: No/denies pain    Home Living                          Prior Function            PT Goals (current goals can now be found in the care plan section) Acute Rehab PT Goals Patient Stated Goal: No new goals stated during session. Progress towards PT goals: Progressing toward goals    Frequency    Min 2X/week      PT Plan      Co-evaluation              AM-PAC PT 6 Clicks Mobility   Outcome Measure  Help needed turning from your back to your side while in a flat bed without using bedrails?: A Lot Help needed moving from lying on your back to sitting on the side of a flat bed without using bedrails?: A Lot Help needed moving to and from a bed to a chair (including a wheelchair)?: A Lot Help needed standing up from a chair using your arms (e.g., wheelchair or bedside  chair)?: A Lot Help needed to walk in hospital room?: A Lot Help needed climbing 3-5 steps with a railing? : Total 6 Click Score: 11    End of Session Equipment Utilized During Treatment: Gait belt Activity Tolerance: Patient tolerated treatment well Patient left: in bed;with call bell/phone within reach;with bed alarm set;with family/visitor present Nurse Communication: Mobility status PT Visit Diagnosis: Unsteadiness on feet (R26.81);Muscle weakness (generalized) (M62.81)     Time: 8649-8582 PT Time Calculation (min) (ACUTE ONLY): 27 min  Charges:    $Gait Training: 8-22 mins $Therapeutic Activity: 8-22 mins PT General Charges $$ ACUTE PT VISIT: 1 Visit                     Sabra Morel, PT, DPT  Acute Rehabilitation Services         Office: [phone]      Sabra MARLA Morel 07/22/2024, 2:37 PM

## 2024-07-22 NOTE — Plan of Care (Signed)
" °  Problem: Coping: Goal: Level of anxiety will decrease Outcome: Progressing   Problem: Elimination: Goal: Will not experience complications related to bowel motility Outcome: Progressing   Problem: Pain Managment: Goal: General experience of comfort will improve and/or be controlled Outcome: Progressing   Problem: Education: Goal: Knowledge of disease or condition will improve Outcome: Progressing Goal: Knowledge of secondary prevention will improve (MUST DOCUMENT ALL) Outcome: Progressing   Problem: Education: Goal: Knowledge of secondary prevention will improve (MUST DOCUMENT ALL) Outcome: Progressing   Problem: Nutrition: Goal: Risk of aspiration will decrease Outcome: Progressing Goal: Dietary intake will improve Outcome: Progressing   "

## 2024-07-22 NOTE — Plan of Care (Signed)

## 2024-07-22 NOTE — Progress Notes (Signed)
 NT and myself went into room to do an in and out cath per mds orders. Daughter informed us  pt had just voided and Husband emptied her urine. MD notified.

## 2024-07-22 NOTE — Progress Notes (Addendum)
 Occupational Therapy Treatment Patient Details Name: Debbie Cardenas MRN: [id number] DOB: 11/18/1945 Today's Date: 07/22/2024   History of present illness Pt is a 78 y/o F who presented to [facility] ED for cognitive decline per husband. Pt admitted for workup of AMS. PMHx: recent cognitive decline without formal dx of dementia, anxiety, HLD, HTN, L femoral neck fx (2020), R TKA (2024).   OT comments  Upon OT arrival, pt standing next to bed with bare feet and with DIL attempting to walk pt to the bathroom and son asking DIL if he should unhook telemetry cords with no staff present in room. OT instructed DIL to sit pt back down at EOB while OT retreived BSC. Pt able to complete toileting using BSC, grooming, and dressing tasks during session with Max-Total assist +2 for safety. Pt requiring Min-Mod assist for STS, Max assist +2 HHA for step-pivot transfers, and Total assist +2 for bed mobility this session. Pt accurately following 1-step commands in 1/5 opportunities with increased time and cues this session. Pt requiring Max multi-modal cues for initiation of tasks with pt unable to sequence through familiar tasks this session with increased time and multi-modal cues provided by OT or family members. Pt also with noted agitation and restlessness this session with family reporting pt sundowns at baseline. During session, OT educated pt's son and DIL in need to have staff present for all transfers, OOB activity, and sitting EOB as pt has a high fall risk with family verbalizing understanding. Pt in bed with bed alarm on, call bell within reach, and family present at end of session. Pt's RN and Consulting Civil Engineer also notified of family attempting to walk pt in room without staff present. Pt HR in the 60s to 80s and O2 sat >/92% on RA throughout session. Planned to take orthos during session; however, unable due to pt being found up OOB with family upon OT arrival and pt agitation preventing attempt later in session. Pt is  making poor progress toward OT goals at this time due to current cognitive level. OT to continue to follow and will update goals and POC as appropriate based on pt progress. Post acute discharge, pt will benefit from intensive inpatient skilled rehab services < 3 hours per day paired with 24/7 supervision/assistance to maximize rehab potential, decrease risk of falls, decrease caregiver burden, and decrease risk of rehospitalization.       If plan is discharge home, recommend the following:  Two people to help with walking and/or transfers;Two people to help with bathing/dressing/bathroom;Assistance with cooking/housework;Assistance with feeding;Direct supervision/assist for medications management;Direct supervision/assist for financial management;Assist for transportation;Help with stairs or ramp for entrance;Supervision due to cognitive status;Other (comment) (24/7 supervision/assistance for safety)   Equipment Recommendations  Other (comment) (defer to next level of care)    Recommendations for Other Services      Precautions / Restrictions Precautions Precautions: Fall Recall of Precautions/Restrictions: Impaired Restrictions Weight Bearing Restrictions Per Provider Order: No       Mobility Bed Mobility Overal bed mobility: Needs Assistance         Sit to supine: Total assist, +2 for physical assistance, +2 for safety/equipment   General bed mobility comments: Pt with increasing agitation when transferring from [facility] to bed. Pt able to sit EOB with Max +2 assist, but unable to sequence steps needed to lie down in bed with step-by-step cues, multi-modal cues provided by OT and/or family, and increased time. Due to this, pt requiring Total assist +2 to transfer back  to supine. Pt agitation beginning to lessen once back in bed but with pt remaining agitated and restless at end of session. Bed alarm on and family present at end of session.    Transfers Overall transfer level: Needs  assistance Equipment used: 2 person hand held assist Transfers: Sit to/from Stand, Bed to chair/wheelchair/BSC Sit to Stand: Min assist, Mod assist Stand pivot transfers: Max assist, +2 physical assistance, +2 safety/equipment         General transfer comment: Pt requiring Max multi-modal cues for initiation and unable to sequence tasks. Pt frequently attempting to push OT and DIL, who was assisting pt, away from her during transfers and ADLs.     Balance Overall balance assessment: Needs assistance Sitting-balance support: Bilateral upper extremity supported, Single extremity supported, No upper extremity supported, Feet supported Sitting balance-Leahy Scale: Good Sitting balance - Comments: sitting EOB and on BSC   Standing balance support: Bilateral upper extremity supported, During functional activity, Reliant on assistive device for balance Standing balance-Leahy Scale: Poor Standing balance comment: Pt unsteady in standing, requiring hand held assist x2. Level of confusion, agitation, and difficulty redirecting seem to be a big component in balance deficits.                           ADL either performed or assessed with clinical judgement   ADL Overall ADL's : Needs assistance/impaired     Grooming: Maximal assistance;Sitting;Total assistance;Cueing for sequencing (cues for initiation)           Upper Body Dressing : Total assistance;Sitting   Lower Body Dressing: Total assistance;Sit to/from stand;+2 for physical assistance;+2 for safety/equipment   Toilet Transfer: Maximal assistance;+2 for physical assistance;+2 for safety/equipment   Toileting- Clothing Manipulation and Hygiene: Total assistance;+2 for physical assistance;+2 for safety/equipment       Functional mobility during ADLs: Maximal assistance;+2 for physical assistance;+2 for safety/equipment General ADL Comments: Pt requires Max multimodal cues for initiation and is unable to squence  familiar tasks this session.    Extremity/Trunk Assessment Upper Extremity Assessment Upper Extremity Assessment: Generalized weakness (assessed through clinical observation as pt with very limited ability to follow 1-step directions)   Lower Extremity Assessment Lower Extremity Assessment: Defer to PT evaluation        Vision   Additional Comments: Vision appears [facility] for tasks assess with pt visually tracking items, making eye contact with family and OT, and reaching and accurately grasping items. Pt unable to follow instructions for vision screen and unable to report vision.   Perception     Praxis     Communication Communication Communication: Impaired Factors Affecting Communication: Difficulty expressing self;Other (comment) (Requires increased time for processing)   Cognition Arousal: Alert Behavior During Therapy: Restless, Agitated, Impulsive Cognition: History of cognitive impairments, Cognition impaired   Orientation impairments: Place, Situation, Time, Person (Pt not responding to her name and repeatedly stating that Reyes (her son who was present throughout session and in pt's line of sight) had left and did not beleive OT or family when we stated that Reyes was present) Awareness: Intellectual awareness impaired, Online awareness impaired Memory impairment (select all impairments): Short-term memory, Working civil service fast streamer, Non-declarative long-term memory, Geneticist, Molecular long-term memory Attention impairment (select first level of impairment): Focused attention Executive functioning impairment (select all impairments): Initiation, Organization, Sequencing, Reasoning, Problem solving OT - Cognition Comments: Son and DIL report pt with increased cognitive decline over past 5-6 months with accelerated decline over past month. Son and  DIL reports pt sundowns at baseline. Pt accurately following 1-step commands in 1/5 opportunities with increased time and cues this session. Pt  requiring Max multi-modal cues for initiation of tasks and pt unable to sequence through familiar tasks this session even with Max cues.                 Following commands: Impaired Following commands impaired: Follows one step commands inconsistently, Follows one step commands with increased time      Cueing   Cueing Techniques: Verbal cues, Tactile cues, Visual cues  Exercises      Shoulder Instructions       General Comments HR in the 60s to 80s and O2 sat >/92% on RA throughout session. Planned to take orthos during session; however, upon arrival, pt standing next to bed with bare feet and with DIL attempting to walk pt to the bathroom and son asking DIL if he should unhook telemetry cords and with no staff present in room. OT instructed DIL to sit pt back down at EOB while OT retreived BSC. Pt able to complete toileting using BSC with Max-Total assist +2 for safety. OT educated pt's son and DIL in need to have staff present for all transfers, OOB activity, and sitting EOB as pt is a high fall risk with family verbalizing understanding. Pt in bed with bed alarm on and call bell within reach and family present at end of session. Pt's RN and Consulting Civil Engineer also notified of family attempting to walk pt in room without staff.    Pertinent Vitals/ Pain       Pain Assessment Pain Assessment: Faces Faces Pain Scale: No hurt Pain Intervention(s): Monitored during session  Home Living                                          Prior Functioning/Environment              Frequency  Min 2X/week        Progress Toward Goals  OT Goals(current goals can now be found in the care plan section)  Progress towards OT goals: Not progressing toward goals - comment (pt progress currently limited by cognitive status)  Acute Rehab OT Goals Patient Stated Goal: pt unable to state  Plan      Co-evaluation                 AM-PAC OT 6 Clicks Daily Activity      Outcome Measure   Help from another person eating meals?: A Lot Help from another person taking care of personal grooming?: A Lot Help from another person toileting, which includes using toliet, bedpan, or urinal?: Total Help from another person bathing (including washing, rinsing, drying)?: Total Help from another person to put on and taking off regular upper body clothing?: Total Help from another person to put on and taking off regular lower body clothing?: Total 6 Click Score: 8    End of Session    OT Visit Diagnosis: Unsteadiness on feet (R26.81);Muscle weakness (generalized) (M62.81);Other abnormalities of gait and mobility (R26.89);Other symptoms and signs involving cognitive function   Activity Tolerance Other (comment);Treatment limited secondary to agitation (Pt limited by current cognitive level)   Patient Left in bed;with call bell/phone within reach;with bed alarm set;with family/visitor present   Nurse Communication Mobility status;Other (comment) (Upon OT arrival, found pt family attempting to walk  her to the bathroom without staff assist. Pt's family requesting medication to address pt agitation. Pt in bed with bed alarm on, call bell in reach, and family present at end of session.)        Time: 8465-8443 OT Time Calculation (min): 22 min  Charges: OT General Charges $OT Visit: 1 Visit OT Treatments $Self Care/Home Management : 8-22 mins  Margarie Rockey HERO., OTR/L, MA Acute Rehab [phone]   Margarie FORBES Horns 07/22/2024, 4:44 PM

## 2024-07-22 NOTE — Progress Notes (Signed)
 "                        PROGRESS NOTE  Brief Summary: Debbie Cardenas is a 78 y.o.  female with history of presumed dementia (per family), A-fib, CAD-s/p CABG 2022-presented with falls and worsening mental status for 2 months. Overall workup has been unremarkable. Neurology has been consulted. Likely worsening dementia.   Consults: Neurology   Subjective: Pt reports no concerns or questions at this time. She is unable to orient to place or situation. She does not know who. Seems to be interacting and conversing with us  based on out of context information.   Objective: Vitals: Blood pressure (!) 157/57, pulse 64, temperature 98 F (36.7 C), temperature source Axillary, resp. rate 17, height 5' 5 (1.651 m), weight 63 kg, SpO2 98%.   Exam: Gen Exam: resting comfortably. Able to follow directions Chest: B/L clear to auscultation anteriorly CVS:S1S2 regular Extremities:no edema Neurology: Non focal-generalized weakness. No dysarthria. Disoriented to place, time, people Skin: no lesions on exposed skin  Pertinent Labs/Radiology:    Latest Ref Rng & Units 07/19/2024    3:58 AM 07/18/2024    4:56 AM 07/17/2024    5:19 PM  CBC  WBC 4.0 - 10.5 K/uL 5.3  4.9  5.2   Hemoglobin 12.0 - 15.0 g/dL 87.8  87.9  87.5   Hematocrit 36.0 - 46.0 % 35.3  34.8  37.1   Platelets 150 - 400 K/uL 210  194  215     Lab Results  Component Value Date   NA 139 07/20/2024   K 3.5 07/20/2024   CL 104 07/20/2024   CO2 24 07/20/2024    Assessment/Plan: Acute encephalopathy. Suspected dementia worsening. Metabolic workup reasonably ruled out more common causes. TSH normal. Head imaging normal.  - neurology consulted.    - starting thiamine .  - avoid sedating medication - supportive care - delirium precautions - outpatient dementia screening if necessary - can start donepazil if family wishes - PT/OT recommending SNF  Hypertensive urgency BP better controlled but still fluctuating Continue  metoprolol /amlodipine   ?  Cerebral venous sinus thrombosis Ruled out at this point-CT venogram negative-already on chronic anticoagulation.  HLD Lipitor/Zetia   PAF Resume metoprolol  Telemetry monitoring On Eliquis   CAD s/p CABG- not endorsing Anginal symptoms Continue beta-blocker/statin  History of vitamin B12 deficiency Continue B12 supplementation B12 level stable.  Frequent falls Check orthostatics PT/OT eval  Code status:   Code Status: Full Code   DVT Prophylaxis: apixaban  (ELIQUIS ) tablet 5 mg    Family Communication: daughter at bedside  Disposition Plan: Status is: Inpatient Remains inpatient appropriate because: Severity of illness   Planned Discharge Destination:Home health   LABORATORY DATA: CBC: Recent Labs  Lab 07/17/24 1719 07/18/24 0456 07/19/24 0358  WBC 5.2 4.9 5.3  NEUTROABS 2.9  --  3.1  HGB 12.4 12.0 12.1  HCT 37.1 34.8* 35.3*  MCV 93.7 90.9 91.0  PLT 215 194 210    Basic Metabolic Panel: Recent Labs  Lab 07/17/24 1719 07/18/24 0456 07/19/24 0358 07/20/24 0350  NA 141 141 141 139  K 3.7 3.4* 3.3* 3.5  CL 105 107 106 104  CO2 27 26 26 24   GLUCOSE 93 100* 87 116*  BUN 23 17 12 14   CREATININE 0.88 0.77 0.70 0.74  CALCIUM  9.7 9.4 9.1 9.5  MG 2.1  --  2.1  --   PHOS  --   --  3.4  --  RADIOLOGY STUDIES/RESULTS: CT VENOGRAM HEAD Result Date: 07/20/2024 EXAM: CT VENOGRAM WITH CONTRAST 07/20/2024 10:20:29 AM TECHNIQUE: CT venogram of the head/brain was performed with the administration of intravenous contrast (iohexol  (OMNIPAQUE ) 350 MG/ML injection 75 mL IOHEXOL  350 MG/ML SOLN). Multiplanar reformatted images are provided for review. MIP images are provided for review. Automated exposure control, iterative reconstruction, and/or weight based adjustment of the mA/kV was utilized to reduce the radiation dose to as low as reasonably achievable. COMPARISON: MRI brain 07/18/2024 and CT head 07/17/2024. CLINICAL HISTORY: Dural  venous sinus thrombosis suspected. FINDINGS: BRAIN/VENTRICLES: No acute intracranial hemorrhage. No extra axial fluid collection. Gray-white differentiation is maintained. No mass effect or midline shift. No hydrocephalus. There is overall unchanged mild scattered white matter hypodensities which are nonspecific but most commonly represent chronic microvascular ischemic changes. ORBITS: Orbits demonstrate bilateral lens replacement. SINUSES AND MASTOIDS: No acute abnormality. SOFT TISSUES AND SKULL: No acute abnormality. CT VENOGRAM: No evidence of dural venous sinus or cerebral vein thrombosis. No significant stenosis. IMPRESSION: 1. No evidence of dural venous sinus or cerebral vein thrombosis. 2. No substantial change since 07/17/2024. Electronically signed by: Prentice Spade MD 07/20/2024 11:53 AM EST RP Workstation: [id number]     LOS: 5 days   Debbie LITTIE Piety, MD  [facility]   To contact the attending provider between 7A-7P or the covering provider during after hours 7P-7A, please log into the web site [email or web address] and access using universal [place] password for that web site. If you do not have the password, please call the hospital operator.  07/22/2024, 8:05 AM "

## 2024-07-23 DIAGNOSIS — F0390 Unspecified dementia without behavioral disturbance: Secondary | ICD-10-CM | POA: Diagnosis not present

## 2024-07-23 DIAGNOSIS — R400 Somnolence: Secondary | ICD-10-CM | POA: Diagnosis not present

## 2024-07-23 LAB — URINALYSIS, ROUTINE W REFLEX MICROSCOPIC
Bacteria, UA: NONE SEEN
Bilirubin Urine: NEGATIVE
Glucose, UA: NEGATIVE mg/dL
Hgb urine dipstick: NEGATIVE
Ketones, ur: NEGATIVE mg/dL
Leukocytes,Ua: NEGATIVE
Nitrite: NEGATIVE
Protein, ur: 30 mg/dL — AB
Specific Gravity, Urine: 1.017 (ref 1.005–1.030)
pH: 6 (ref 5.0–8.0)

## 2024-07-23 MED ORDER — SMOG ENEMA
960.0000 mL | Freq: Once | RECTAL | Status: AC
  Administered 2024-07-23: 960 mL via RECTAL
  Filled 2024-07-23: qty 960

## 2024-07-23 NOTE — Progress Notes (Signed)
 "                        PROGRESS NOTE Brief Summary: Debbie Cardenas is a 78 y.o.  female with history of presumed dementia (per family), A-fib, CAD-s/p CABG 2022-presented with falls and worsening mental status for 2 months. Overall workup has been unremarkable. Neurology has been consulted. Likely worsening dementia.   Consults: Neurology   Subjective: Pt reports no concerns or questions at this time. She is pleasantly confused.   Objective: Vitals: Blood pressure (!) 147/66, pulse 81, temperature 97.8 F (36.6 C), temperature source Oral, resp. rate 15, height 5' 5 (1.651 m), weight 63 kg, SpO2 97%.   Exam: Gen Exam: resting comfortably. Able to follow directions Chest: B/L clear to auscultation anteriorly CVS:S1S2 regular Extremities:no edema Neurology: Non focal-generalized weakness. No dysarthria. Disoriented to place, time, people Skin: no lesions on exposed skin  Pertinent Labs/Radiology:    Latest Ref Rng & Units 07/19/2024    3:58 AM 07/18/2024    4:56 AM 07/17/2024    5:19 PM  CBC  WBC 4.0 - 10.5 K/uL 5.3  4.9  5.2   Hemoglobin 12.0 - 15.0 g/dL 87.8  87.9  87.5   Hematocrit 36.0 - 46.0 % 35.3  34.8  37.1   Platelets 150 - 400 K/uL 210  194  215     Lab Results  Component Value Date   NA 139 07/20/2024   K 3.5 07/20/2024   CL 104 07/20/2024   CO2 24 07/20/2024    Assessment/Plan: Acute encephalopathy. Suspected dementia worsening. Metabolic workup reasonably ruled out more common causes. TSH normal. Head imaging normal.  - neurology consulted.    - starting thiamine .  - avoid sedating medication - supportive care - delirium precautions - outpatient dementia screening if necessary - per discussion with husband, will start appetite stimulant and donepazil  - PT/OT recommending SNF  Hypertensive urgency BP better controlled but still fluctuating Continue metoprolol /amlodipine   ?  Cerebral venous sinus thrombosis Ruled out at this point-CT venogram  negative-already on chronic anticoagulation.  HLD Lipitor/Zetia   PAF Resume metoprolol  Telemetry monitoring On Eliquis   CAD s/p CABG- not endorsing Anginal symptoms Continue beta-blocker/statin  History of vitamin B12 deficiency Continue B12 supplementation B12 level stable.  Frequent falls Check orthostatics PT/OT eval  Code status:   Code Status: Full Code   DVT Prophylaxis: apixaban  (ELIQUIS ) tablet 5 mg    Family Communication: husband at bedside  Disposition Plan: Status is: Inpatient Remains inpatient appropriate because: placement pending   Planned Discharge Destination:Home health  LABORATORY DATA: CBC: Recent Labs  Lab 07/17/24 1719 07/18/24 0456 07/19/24 0358  WBC 5.2 4.9 5.3  NEUTROABS 2.9  --  3.1  HGB 12.4 12.0 12.1  HCT 37.1 34.8* 35.3*  MCV 93.7 90.9 91.0  PLT 215 194 210    Basic Metabolic Panel: Recent Labs  Lab 07/17/24 1719 07/18/24 0456 07/19/24 0358 07/20/24 0350  NA 141 141 141 139  K 3.7 3.4* 3.3* 3.5  CL 105 107 106 104  CO2 27 26 26 24   GLUCOSE 93 100* 87 116*  BUN 23 17 12 14   CREATININE 0.88 0.77 0.70 0.74  CALCIUM  9.7 9.4 9.1 9.5  MG 2.1  --  2.1  --   PHOS  --   --  3.4  --    RADIOLOGY STUDIES/RESULTS: No results found.    LOS: 6 days   Debbie LITTIE Piety, MD  [facility]  To contact the attending provider between 7A-7P or the covering provider during after hours 7P-7A, please log into the web site [email or web address] and access using universal [place] password for that web site. If you do not have the password, please call the hospital operator.  07/23/2024, 7:46 AM "

## 2024-07-23 NOTE — Plan of Care (Signed)
  Problem: Clinical Measurements: Goal: Diagnostic test results will improve Outcome: Progressing Goal: Cardiovascular complication will be avoided Outcome: Progressing   Problem: Nutrition: Goal: Adequate nutrition will be maintained Outcome: Progressing   

## 2024-07-23 NOTE — TOC Progression Note (Signed)
 Transition of Care [facility]) - Progression Note    Patient Details  Name: Debbie Cardenas MRN: [id number] Date of Birth: 05-Oct-1945  Transition of Care [facility]) CM/SW Contact  [name], LCSW Phone Number: 07/23/2024, 12:16 PM  Clinical Narrative:    Awaiting SNF bed offers; expanded referral.    Expected Discharge Plan:  (TBD) Barriers to Discharge: Continued Medical Work up               Expected Discharge Plan and Services       Living arrangements for the past 2 months: Single Family Home                                       Social Drivers of Health (SDOH) Interventions SDOH Screenings   Food Insecurity: No Food Insecurity (07/18/2024)  Housing: Low Risk (07/18/2024)  Transportation Needs: No Transportation Needs (07/18/2024)  Utilities: Not At Risk (07/18/2024)  Financial Resource Strain: Low Risk (03/17/2024)   Received from [facility]  Physical Activity: Inactive (03/17/2024)   Received from [facility]  Social Connections: Moderately Isolated (07/18/2024)  Stress: No Stress Concern Present (03/17/2024)   Received from [facility]  Tobacco Use: Medium Risk (07/18/2024)    Readmission Risk Interventions     No data to display

## 2024-07-24 DIAGNOSIS — F0390 Unspecified dementia without behavioral disturbance: Secondary | ICD-10-CM | POA: Diagnosis not present

## 2024-07-24 DIAGNOSIS — R400 Somnolence: Secondary | ICD-10-CM | POA: Diagnosis not present

## 2024-07-24 LAB — VITAMIN B1: Vitamin B1 (Thiamine): 87.6 nmol/L (ref 66.5–200.0)

## 2024-07-24 MED ORDER — MIRTAZAPINE 15 MG PO TABS
7.5000 mg | ORAL_TABLET | Freq: Every day | ORAL | Status: DC
  Administered 2024-07-24 – 2024-07-27 (×4): 7.5 mg via ORAL
  Filled 2024-07-24 (×3): qty 1

## 2024-07-24 MED ORDER — QUETIAPINE FUMARATE 25 MG PO TABS
25.0000 mg | ORAL_TABLET | Freq: Every day | ORAL | Status: DC
  Administered 2024-07-24 – 2024-07-27 (×4): 25 mg via ORAL
  Filled 2024-07-24 (×3): qty 1

## 2024-07-24 MED ORDER — DONEPEZIL HCL 5 MG PO TABS
5.0000 mg | ORAL_TABLET | Freq: Every day | ORAL | Status: DC
  Administered 2024-07-24 – 2024-07-27 (×4): 5 mg via ORAL
  Filled 2024-07-24 (×3): qty 1

## 2024-07-24 NOTE — Plan of Care (Signed)
" °  Problem: Health Behavior/Discharge Planning: Goal: Ability to manage health-related needs will improve Outcome: Progressing   Problem: Clinical Measurements: Goal: Diagnostic test results will improve Outcome: Progressing   Problem: Activity: Goal: Risk for activity intolerance will decrease Outcome: Progressing   Problem: Coping: Goal: Level of anxiety will decrease Outcome: Progressing   Problem: Elimination: Goal: Will not experience complications related to bowel motility Outcome: Progressing   Problem: Safety: Goal: Ability to remain free from injury will improve Outcome: Progressing   Problem: Education: Goal: Knowledge of disease or condition will improve Outcome: Progressing   "

## 2024-07-24 NOTE — Progress Notes (Signed)
 "                       PROGRESS NOTE Brief Summary: Debbie Cardenas is a 78 y.o.  female with history of presumed dementia (per family), A-fib, CAD-s/p CABG 2022-presented with falls and worsening mental status for 2 months. Overall workup has been unremarkable.  Neurology has been consulted to evaluate and has signed off with recommendation to add thiamine  supplement and follow up with neurology outpatient. Likely worsening dementia.   Consults: Neurology   Subjective: Pt reports no concerns or questions at this time. She is pleasantly confused.   Objective: Vitals: Blood pressure (!) 132/47, pulse 69, temperature 97.7 F (36.5 C), temperature source Axillary, resp. rate 14, height 5' 5 (1.651 m), weight 63 kg, SpO2 98%.   Exam: Gen Exam: resting comfortably. Able to follow directions Chest: B/L clear to auscultation anteriorly CVS:S1S2 regular Extremities:no edema Neurology: Non focal-generalized weakness. No dysarthria. Disoriented to place, time, people Skin: no lesions on exposed skin  Pertinent Labs/Radiology:    Latest Ref Rng & Units 07/19/2024    3:58 AM 07/18/2024    4:56 AM 07/17/2024    5:19 PM  CBC  WBC 4.0 - 10.5 K/uL 5.3  4.9  5.2   Hemoglobin 12.0 - 15.0 g/dL 87.8  87.9  87.5   Hematocrit 36.0 - 46.0 % 35.3  34.8  37.1   Platelets 150 - 400 K/uL 210  194  215     Lab Results  Component Value Date   NA 139 07/20/2024   K 3.5 07/20/2024   CL 104 07/20/2024   CO2 24 07/20/2024    Assessment/Plan: Acute encephalopathy suspected to be worsening baseline dementia with delirium- Suspected dementia worsening. Metabolic workup reasonably ruled out more common causes. TSH normal. Head, neck, spine imaging normal. EEG neg seizure. B12 elevated. RPR non reactive. Ammonia normal. Infectious evaluation has been negative.  - neurology consulted.    - starting thiamine . Outpatient dementia evaluation.  - avoid sedating medication- although through discussion with  family, we have decided to start remeron  for appetite and sleep benefits as well as donepezil .  - supportive care - delirium precautions - PT/OT recommending SNF. Awaiting placement   Hypertensive urgency BP better controlled but still fluctuating Continue metoprolol /amlodipine   ?  Cerebral venous sinus thrombosis Ruled out at this point-CT venogram negative-already on chronic anticoagulation.  HLD Lipitor/Zetia   PAF Resume metoprolol  Telemetry monitoring On Eliquis   CAD s/p CABG- not endorsing Anginal symptoms Continue beta-blocker/statin  History of vitamin B12 deficiency Continue B12 supplementation B12 level stable.  Frequent falls Check orthostatics PT/OT eval  Code status:   Code Status: Full Code   DVT Prophylaxis: apixaban  (ELIQUIS ) tablet 5 mg    Family Communication: daughterat bedside  Disposition Plan: Status is: Inpatient Remains inpatient appropriate because: placement pending   Planned Discharge Destination:Home health  LABORATORY DATA: CBC: Recent Labs  Lab 07/17/24 1719 07/18/24 0456 07/19/24 0358  WBC 5.2 4.9 5.3  NEUTROABS 2.9  --  3.1  HGB 12.4 12.0 12.1  HCT 37.1 34.8* 35.3*  MCV 93.7 90.9 91.0  PLT 215 194 210    Basic Metabolic Panel: Recent Labs  Lab 07/17/24 1719 07/18/24 0456 07/19/24 0358 07/20/24 0350  NA 141 141 141 139  K 3.7 3.4* 3.3* 3.5  CL 105 107 106 104  CO2 27 26 26 24   GLUCOSE 93 100* 87 116*  BUN 23 17 12 14   CREATININE 0.88 0.77 0.70  0.74  CALCIUM  9.7 9.4 9.1 9.5  MG 2.1  --  2.1  --   PHOS  --   --  3.4  --    RADIOLOGY STUDIES/RESULTS: No results found.    LOS: 7 days   Debbie LITTIE Piety, MD  [facility]   To contact the attending provider between 7A-7P or the covering provider during after hours 7P-7A, please log into the web site [email or web address] and access using universal [place] password for that web site. If you do not have the password, please call the hospital  operator.  07/24/2024, 7:31 AM "

## 2024-07-24 NOTE — TOC Progression Note (Signed)
 Transition of Care [facility]) - Progression Note    Patient Details  Name: Debbie Cardenas MRN: [id number] Date of Birth: Aug 25, 1945  Transition of Care [facility]) CM/SW Contact  Luann SHAUNNA [place], [place] Phone Number: 07/24/2024, 12:58 PM  Clinical Narrative:     Met with pt's spouse and pt's son bedside. Provided SNF bed offers and their medicare star ratings. Spouse is familiar with multiple facilities on the list and has had negative experiences with them with other family members. Spouse and son request CSW look in [place] area for SNF, specifically for [facility]. CSW agreed to send additional referrals. CSW explained that pt is medically ready for DC once a facility is secured and shara is approved. Informed spouse that a second choice would be needed if [facility] cannot offer. [facility] is considering pt and spouse may consider this facility; he plans to tour [facility] today. CSW provided contact info for SNF choice. ICM will continue to follow.   Expected Discharge Plan: Skilled Nursing Facility Barriers to Discharge: Continued Medical Work up               Expected Discharge Plan and Services       Living arrangements for the past 2 months: Single Family Home                                       Social Drivers of Health (SDOH) Interventions SDOH Screenings   Food Insecurity: No Food Insecurity (07/18/2024)  Housing: Low Risk (07/18/2024)  Transportation Needs: No Transportation Needs (07/18/2024)  Utilities: Not At Risk (07/18/2024)  Financial Resource Strain: Low Risk (03/17/2024)   Received from [facility]  Physical Activity: Inactive (03/17/2024)   Received from [facility]  Social Connections: Moderately Isolated (07/18/2024)  Stress: No Stress Concern Present (03/17/2024)   Received from [facility]  Tobacco Use: Medium Risk (07/18/2024)    Readmission Risk Interventions     No data to display

## 2024-07-25 DIAGNOSIS — R410 Disorientation, unspecified: Secondary | ICD-10-CM | POA: Diagnosis not present

## 2024-07-25 LAB — BASIC METABOLIC PANEL WITH GFR
Anion gap: 9 (ref 5–15)
BUN: 35 mg/dL — ABNORMAL HIGH (ref 8–23)
CO2: 27 mmol/L (ref 22–32)
Calcium: 9.5 mg/dL (ref 8.9–10.3)
Chloride: 105 mmol/L (ref 98–111)
Creatinine, Ser: 0.74 mg/dL (ref 0.44–1.00)
GFR, Estimated: >60 mL/min
Glucose, Bld: 97 mg/dL (ref 70–99)
Potassium: 3.5 mmol/L (ref 3.5–5.1)
Sodium: 142 mmol/L (ref 135–145)

## 2024-07-25 LAB — CBC WITH DIFFERENTIAL/PLATELET
Abs Immature Granulocytes: 0.03 K/uL (ref 0.00–0.07)
Basophils Absolute: 0.1 K/uL (ref 0.0–0.1)
Basophils Relative: 1 %
Eosinophils Absolute: 0.2 K/uL (ref 0.0–0.5)
Eosinophils Relative: 3 %
HCT: 34.9 % — ABNORMAL LOW (ref 36.0–46.0)
Hemoglobin: 11.8 g/dL — ABNORMAL LOW (ref 12.0–15.0)
Immature Granulocytes: 0 %
Lymphocytes Relative: 21 %
Lymphs Abs: 1.6 K/uL (ref 0.7–4.0)
MCH: 30.8 pg (ref 26.0–34.0)
MCHC: 33.8 g/dL (ref 30.0–36.0)
MCV: 91.1 fL (ref 80.0–100.0)
Monocytes Absolute: 0.6 K/uL (ref 0.1–1.0)
Monocytes Relative: 8 %
Neutro Abs: 4.8 K/uL (ref 1.7–7.7)
Neutrophils Relative %: 67 %
Platelets: 270 K/uL (ref 150–400)
RBC: 3.83 MIL/uL — ABNORMAL LOW (ref 3.87–5.11)
RDW: 13 % (ref 11.5–15.5)
WBC: 7.3 K/uL (ref 4.0–10.5)
nRBC: 0 % (ref 0.0–0.2)

## 2024-07-25 LAB — PHOSPHORUS: Phosphorus: 3.3 mg/dL (ref 2.5–4.6)

## 2024-07-25 LAB — MAGNESIUM: Magnesium: 2 mg/dL (ref 1.7–2.4)

## 2024-07-25 MED ORDER — POTASSIUM CHLORIDE CRYS ER 20 MEQ PO TBCR
40.0000 meq | EXTENDED_RELEASE_TABLET | Freq: Once | ORAL | Status: AC
  Administered 2024-07-25: 40 meq via ORAL
  Filled 2024-07-25: qty 2

## 2024-07-25 NOTE — Plan of Care (Signed)
" °  Problem: Education: Goal: Knowledge of General Education information will improve Description: Including pain rating scale, medication(s)/side effects and non-pharmacologic comfort measures Outcome: Progressing   Problem: Health Behavior/Discharge Planning: Goal: Ability to manage health-related needs will improve Outcome: Progressing   Problem: Clinical Measurements: Goal: Ability to maintain clinical measurements within normal limits will improve Outcome: Progressing Goal: Will remain free from infection Outcome: Progressing Goal: Diagnostic test results will improve Outcome: Progressing Goal: Respiratory complications will improve Outcome: Progressing Goal: Cardiovascular complication will be avoided Outcome: Progressing   Problem: Activity: Goal: Risk for activity intolerance will decrease Outcome: Progressing   Problem: Nutrition: Goal: Adequate nutrition will be maintained Outcome: Progressing   Problem: Coping: Goal: Level of anxiety will decrease Outcome: Progressing   Problem: Elimination: Goal: Will not experience complications related to bowel motility Outcome: Progressing Goal: Will not experience complications related to urinary retention Outcome: Progressing   Problem: Pain Managment: Goal: General experience of comfort will improve and/or be controlled Outcome: Progressing   Problem: Safety: Goal: Ability to remain free from injury will improve Outcome: Progressing   Problem: Skin Integrity: Goal: Risk for impaired skin integrity will decrease Outcome: Progressing   Problem: Education: Goal: Knowledge of disease or condition will improve Outcome: Progressing Goal: Knowledge of secondary prevention will improve (MUST DOCUMENT ALL) Outcome: Progressing Goal: Knowledge of patient specific risk factors will improve (DELETE if not current risk factor) Outcome: Progressing   Problem: Self-Care: Goal: Ability to participate in self-care as  condition permits will improve Outcome: Progressing Goal: Verbalization of feelings and concerns over difficulty with self-care will improve Outcome: Progressing Goal: Ability to communicate needs accurately will improve Outcome: Progressing   Problem: Nutrition: Goal: Risk of aspiration will decrease Outcome: Progressing Goal: Dietary intake will improve Outcome: Progressing   "

## 2024-07-25 NOTE — Progress Notes (Signed)
 "                       PROGRESS NOTE Brief Summary: Debbie Cardenas is a 78 y.o.  female with history of presumed dementia (per family), A-fib, CAD-s/p CABG 2022-presented with falls and worsening mental status for 2 months. Overall workup has been unremarkable.  Neurology has been consulted to evaluate and has signed off with recommendation to add thiamine  supplement and follow up with neurology outpatient. Likely worsening dementia.   Consults: Neurology   Subjective: Patient in bed resting comfortably, in no distress.  Objective: Vitals: Blood pressure (!) 147/55, pulse 69, temperature 98.5 F (36.9 C), temperature source Axillary, resp. rate 14, height 5' 5 (1.651 m), weight 63 kg, SpO2 98%.   Exam:  Bed sleeping, no focal deficits .AT,PERRAL Supple Neck, No JVD,   Symmetrical Chest wall movement, Good air movement bilaterally, CTAB RRR,No Gallops, Rubs or new Murmurs,  +ve B.Sounds, Abd Soft, No tenderness,   No Cyanosis, Clubbing or edema   Assessment/Plan:  Acute encephalopathy suspected to be worsening baseline dementia with delirium- Suspected dementia worsening. Metabolic workup reasonably ruled out more common causes. TSH normal. Head, neck, spine imaging normal. EEG neg seizure. B12 elevated. RPR non reactive. Ammonia normal. Infectious evaluation has been negative.  Seen by neurology and cleared, history is suggestive of undiagnosed dementia with gradually declining mental status over several months at home.  Continue supportive care, minimize narcotics and benzodiazepines.  Continue supportive care with outpatient neurology follow-up postdischarge.  On thiamine , Remeron  and donepezil .  Hypertensive urgency BP better controlled but still fluctuating Continue metoprolol /amlodipine   ?  Cerebral venous sinus thrombosis Ruled out at this point-CT venogram negative-already on chronic anticoagulation with Eliquis , continue.  HLD Lipitor/Zetia   PAF Resume  metoprolol  Telemetry monitoring On Eliquis   CAD s/p CABG- not endorsing Anginal symptoms Continue beta-blocker/statin  History of vitamin B12 deficiency Continue B12 supplementation B12 level stable.  Frequent falls Check orthostatics PT/OT eval  Code status:   Code Status: Full Code   DVT Prophylaxis: apixaban  (ELIQUIS ) tablet 5 mg    Family Communication: daughterat and husband bedside on 07/25/2024  Disposition Plan: Status is: Inpatient Remains inpatient appropriate because: placement pending   Planned Discharge Destination:Home health   Data Review:   Patient Lines/Drains/Airways Status     Active Line/Drains/Airways     Name Placement date Placement time Site Days   Peripheral IV 07/17/24 20 G Anterior;Right Forearm 07/17/24  2206  Forearm  8   External Urinary Catheter 07/18/24  1400  --  7             Inpatient Medications  Scheduled Meds:  amLODipine   10 mg Oral Daily   apixaban   5 mg Oral BID   atorvastatin   10 mg Oral Daily   cholecalciferol   2,000 Units Oral Daily   donepezil   5 mg Oral QHS   ezetimibe   10 mg Oral Daily   feeding supplement  237 mL Oral BID BM   folic acid   1 mg Oral Daily   melatonin  5 mg Oral QHS   metoprolol  succinate  12.5 mg Oral Daily   mirtazapine   7.5 mg Oral QHS   multivitamin with minerals  1 tablet Oral Daily   QUEtiapine   25 mg Oral QHS   thiamine   100 mg Oral Daily   Continuous Infusions: PRN Meds:.acetaminophen  **OR** acetaminophen , albuterol , haloperidol  lactate, naphazoline-glycerin , ondansetron  **OR** ondansetron  (ZOFRAN ) IV  DVT Prophylaxis   apixaban  (ELIQUIS )  tablet 5 mg   Recent Labs  Lab 07/19/24 0358 07/25/24 0505  WBC 5.3 7.3  HGB 12.1 11.8*  HCT 35.3* 34.9*  PLT 210 270  MCV 91.0 91.1  MCH 31.2 30.8  MCHC 34.3 33.8  RDW 13.2 13.0  LYMPHSABS 1.4 1.6  MONOABS 0.5 0.6  EOSABS 0.2 0.2  BASOSABS 0.1 0.1    Recent Labs  Lab 07/18/24 1407 07/19/24 0358 07/20/24 0350  07/25/24 0505  NA  --  141 139 142  K  --  3.3* 3.5 3.5  CL  --  106 104 105  CO2  --  26 24 27   ANIONGAP  --  10 11 9   GLUCOSE  --  87 116* 97  BUN  --  12 14 35*  CREATININE  --  0.70 0.74 0.74  ALBUMIN   --  3.7  --   --   HGBA1C 4.8  --   --   --   MG  --  2.1  --  2.0  PHOS  --  3.4  --  3.3  CALCIUM   --  9.1 9.5 9.5      Recent Labs  Lab 07/18/24 1407 07/19/24 0358 07/20/24 0350 07/25/24 0505  HGBA1C 4.8  --   --   --   MG  --  2.1  --  2.0  CALCIUM   --  9.1 9.5 9.5    --------------------------------------------------------------------------------------------------------------- Lab Results  Component Value Date   CHOL 169 08/21/2023   HDL 65 08/21/2023   LDLCALC 89 08/21/2023   TRIG 80 08/21/2023   CHOLHDL 2.6 08/21/2023    Lab Results  Component Value Date   HGBA1C 4.8 07/18/2024   Radiology Reports  No results found.    Signature  -   Lavada Stank M.D on 07/25/2024 at 9:55 AM   -  To page go to [email or web address]      "

## 2024-07-26 DIAGNOSIS — R410 Disorientation, unspecified: Secondary | ICD-10-CM | POA: Diagnosis not present

## 2024-07-26 NOTE — Progress Notes (Signed)
 "                       PROGRESS NOTE Brief Summary: Debbie Cardenas is a 78 y.o.  female with history of presumed dementia (per family), A-fib, CAD-s/p CABG 2022-presented with falls and worsening mental status for 2 months. Overall workup has been unremarkable.  Neurology has been consulted to evaluate and has signed off with recommendation to add thiamine  supplement and follow up with neurology outpatient. Likely worsening dementia.   Consults: Neurology   Subjective: Patient in bed resting comfortably, in no distress.  Objective: Vitals: Blood pressure (!) 143/55, pulse (!) 57, temperature (!) 97.2 F (36.2 C), temperature source Axillary, resp. rate 14, height 5' 5 (1.651 m), weight 63 kg, SpO2 98%.   Exam:  Bed sleeping, no focal deficits [place].AT,PERRAL Supple Neck, No JVD,   Symmetrical Chest wall movement, Good air movement bilaterally, CTAB RRR,No Gallops, Rubs or new Murmurs,  +ve B.Sounds, Abd Soft, No tenderness,   No Cyanosis, Clubbing or edema   Assessment/Plan:  Acute encephalopathy suspected to be worsening baseline dementia with delirium- Suspected dementia worsening. Metabolic workup reasonably ruled out more common causes. TSH normal. Head, neck, spine imaging normal. EEG neg seizure. B12 elevated. RPR non reactive. Ammonia normal. Infectious evaluation has been negative.  Seen by neurology and cleared, history is suggestive of undiagnosed dementia with gradually declining mental status over several months at home.  Continue supportive care, minimize narcotics and benzodiazepines.  Continue supportive care with outpatient neurology follow-up postdischarge.  On thiamine , Remeron  and donepezil .  Hypertensive urgency BP better controlled but still fluctuating Continue metoprolol /amlodipine   ?  Cerebral venous sinus thrombosis Ruled out at this point-CT venogram negative-already on chronic anticoagulation with Eliquis , continue.  HLD Lipitor/Zetia   PAF Resume  metoprolol  Telemetry monitoring On Eliquis   CAD s/p CABG- not endorsing Anginal symptoms Continue beta-blocker/statin  History of vitamin B12 deficiency Continue B12 supplementation B12 level stable.  Frequent falls Check orthostatics PT/OT eval  Code status:   Code Status: Full Code   DVT Prophylaxis: apixaban  (ELIQUIS ) tablet 5 mg    Family Communication: daughterat and husband bedside on 07/25/2024, daughter bedside 07/26/2024  Disposition Plan: Status is: Inpatient Remains inpatient appropriate because: placement pending   Planned Discharge Destination:Home health   Data Review:   Patient Lines/Drains/Airways Status     Active Line/Drains/Airways     Name Placement date Placement time Site Days   Peripheral IV 07/17/24 20 G Anterior;Right Forearm 07/17/24  2206  Forearm  9   External Urinary Catheter 07/18/24  1400  --  8             Inpatient Medications  Scheduled Meds:  amLODipine   10 mg Oral Daily   apixaban   5 mg Oral BID   atorvastatin   10 mg Oral Daily   cholecalciferol   2,000 Units Oral Daily   donepezil   5 mg Oral QHS   ezetimibe   10 mg Oral Daily   feeding supplement  237 mL Oral BID BM   folic acid   1 mg Oral Daily   melatonin  5 mg Oral QHS   metoprolol  succinate  12.5 mg Oral Daily   mirtazapine   7.5 mg Oral QHS   multivitamin with minerals  1 tablet Oral Daily   QUEtiapine   25 mg Oral QHS   thiamine   100 mg Oral Daily   Continuous Infusions: PRN Meds:.acetaminophen  **OR** acetaminophen , albuterol , haloperidol  lactate, naphazoline-glycerin , ondansetron  **OR** ondansetron  (ZOFRAN ) IV  DVT  Prophylaxis   apixaban  (ELIQUIS ) tablet 5 mg   Recent Labs  Lab 07/25/24 0505  WBC 7.3  HGB 11.8*  HCT 34.9*  PLT 270  MCV 91.1  MCH 30.8  MCHC 33.8  RDW 13.0  LYMPHSABS 1.6  MONOABS 0.6  EOSABS 0.2  BASOSABS 0.1    Recent Labs  Lab 07/20/24 0350 07/25/24 0505  NA 139 142  K 3.5 3.5  CL 104 105  CO2 24 27  ANIONGAP 11 9   GLUCOSE 116* 97  BUN 14 35*  CREATININE 0.74 0.74  MG  --  2.0  PHOS  --  3.3  CALCIUM  9.5 9.5      Recent Labs  Lab 07/20/24 0350 07/25/24 0505  MG  --  2.0  CALCIUM  9.5 9.5    --------------------------------------------------------------------------------------------------------------- Lab Results  Component Value Date   CHOL 169 08/21/2023   HDL 65 08/21/2023   LDLCALC 89 08/21/2023   TRIG 80 08/21/2023   CHOLHDL 2.6 08/21/2023    Lab Results  Component Value Date   HGBA1C 4.8 07/18/2024   Radiology Reports  No results found.    Signature  -   Lavada Stank M.D on 07/26/2024 at 9:03 AM   -  To page go to [email or web address]      "

## 2024-07-26 NOTE — Plan of Care (Signed)
" °  Problem: Education: Goal: Knowledge of General Education information will improve Description: Including pain rating scale, medication(s)/side effects and non-pharmacologic comfort measures Outcome: Progressing   Problem: Health Behavior/Discharge Planning: Goal: Ability to manage health-related needs will improve Outcome: Progressing   Problem: Clinical Measurements: Goal: Ability to maintain clinical measurements within normal limits will improve Outcome: Progressing Goal: Will remain free from infection Outcome: Progressing Goal: Diagnostic test results will improve Outcome: Progressing Goal: Respiratory complications will improve Outcome: Progressing Goal: Cardiovascular complication will be avoided Outcome: Progressing   Problem: Activity: Goal: Risk for activity intolerance will decrease Outcome: Progressing   Problem: Nutrition: Goal: Adequate nutrition will be maintained Outcome: Progressing   Problem: Coping: Goal: Level of anxiety will decrease Outcome: Progressing   Problem: Elimination: Goal: Will not experience complications related to bowel motility Outcome: Progressing Goal: Will not experience complications related to urinary retention Outcome: Progressing   Problem: Pain Managment: Goal: General experience of comfort will improve and/or be controlled Outcome: Progressing   Problem: Safety: Goal: Ability to remain free from injury will improve Outcome: Progressing   Problem: Skin Integrity: Goal: Risk for impaired skin integrity will decrease Outcome: Progressing   Problem: Education: Goal: Knowledge of disease or condition will improve Outcome: Progressing Goal: Knowledge of secondary prevention will improve (MUST DOCUMENT ALL) Outcome: Progressing Goal: Knowledge of patient specific risk factors will improve (DELETE if not current risk factor) Outcome: Progressing   Problem: Self-Care: Goal: Ability to participate in self-care as  condition permits will improve Outcome: Progressing Goal: Verbalization of feelings and concerns over difficulty with self-care will improve Outcome: Progressing Goal: Ability to communicate needs accurately will improve Outcome: Progressing   Problem: Nutrition: Goal: Risk of aspiration will decrease Outcome: Progressing Goal: Dietary intake will improve Outcome: Progressing   "

## 2024-07-27 DIAGNOSIS — R410 Disorientation, unspecified: Secondary | ICD-10-CM | POA: Diagnosis not present

## 2024-07-27 MED ORDER — METOPROLOL SUCCINATE ER 25 MG PO TB24
12.5000 mg | ORAL_TABLET | Freq: Every day | ORAL | Status: DC
  Administered 2024-07-27 – 2024-07-28 (×2): 12.5 mg via ORAL
  Filled 2024-07-27 (×2): qty 1

## 2024-07-27 NOTE — Progress Notes (Signed)
 Physical Therapy Treatment Patient Details Name: Debbie Cardenas MRN: [id number] DOB: [birth year] Today's Date: 07/27/2024   History of Present Illness Pt is a 78 y/o F who presented to [facility] ED for cognitive decline per husband. Pt admitted for workup of AMS. PMHx: recent cognitive decline without formal dx of dementia, anxiety, HLD, HTN, L femoral neck fx (2020), R TKA (2024).    PT Comments  Pt received in supine and eager for OOB mobility. Pt appears more alert and responsive this session with improved activity tolerance and verbalizations. Pt able to perform increased gait distance with CGA for safety and intermittent assist for RW management. Pt continues to be limited by confusion and decreased awareness. Patient will benefit from continued inpatient follow up therapy, <3 hours/day. Pt continues to benefit from PT services to progress toward functional mobility goals.     If plan is discharge home, recommend the following: A lot of help with walking and/or transfers;A lot of help with bathing/dressing/bathroom;Assistance with cooking/housework;Direct supervision/assist for medications management;Direct supervision/assist for financial management;Assist for transportation;Help with stairs or ramp for entrance;Supervision due to cognitive status   Can travel by private vehicle     No  Equipment Recommendations  BSC/3in1;Rolling walker (2 wheels);Wheelchair (measurements PT);Wheelchair cushion (measurements PT)    Recommendations for Other Services       Precautions / Restrictions Precautions Precautions: Fall Recall of Precautions/Restrictions: Impaired Precaution/Restrictions Comments: delirium prevention precautions Restrictions Weight Bearing Restrictions Per Provider Order: No     Mobility  Bed Mobility Overal bed mobility: Needs Assistance Bed Mobility: Supine to Sit     Supine to sit: Min assist, Used rails, HOB elevated     General bed mobility comments: increased  time and min A via HHA for trunk elevation    Transfers Overall transfer level: Needs assistance Equipment used: Rolling walker (2 wheels) Transfers: Sit to/from Stand Sit to Stand: Contact guard assist           General transfer comment: From EOB with CGA for safety    Ambulation/Gait Ambulation/Gait assistance: Contact guard assist, Min assist, +2 safety/equipment Gait Distance (Feet): 165 Feet Assistive device: Rolling walker (2 wheels) Gait Pattern/deviations: Step-through pattern, Decreased stride length, Trunk flexed Gait velocity: decreased     General Gait Details: Pt demonstrates short steps with flexed trunk despite cues for upright posture and closer RW proximity. Intermittent assist for RW management during turns due to decreased awareness   Stairs             Wheelchair Mobility     Tilt Bed    Modified Rankin (Stroke Patients Only)       Balance Overall balance assessment: Needs assistance Sitting-balance support: Bilateral upper extremity supported, Feet supported Sitting balance-Leahy Scale: Good Sitting balance - Comments: sitting EOB   Standing balance support: Bilateral upper extremity supported, During functional activity, Reliant on assistive device for balance Standing balance-Leahy Scale: Poor Standing balance comment: with RW support                            Communication Communication Communication: No apparent difficulties  Cognition Arousal: Alert Behavior During Therapy: Impulsive, Restless   PT - Cognitive impairments: History of cognitive impairments, Awareness, Safety/Judgement, Problem solving, Sequencing, Initiation, Attention                       PT - Cognition Comments: some decreased awareness of deficits and safety, but easily redirected  Following commands: Impaired Following commands impaired: Follows one step commands with increased time    Cueing Cueing Techniques: Verbal cues, Tactile  cues, Visual cues  Exercises      General Comments        Pertinent Vitals/Pain Pain Assessment Pain Assessment: No/denies pain     PT Goals (current goals can now be found in the care plan section) Acute Rehab PT Goals Patient Stated Goal: No new goals stated during session. PT Goal Formulation: With family Time For Goal Achievement: 08/02/24 Progress towards PT goals: Progressing toward goals    Frequency    Min 2X/week       AM-PAC PT 6 Clicks Mobility   Outcome Measure  Help needed turning from your back to your side while in a flat bed without using bedrails?: A Little Help needed moving from lying on your back to sitting on the side of a flat bed without using bedrails?: A Little Help needed moving to and from a bed to a chair (including a wheelchair)?: A Little Help needed standing up from a chair using your arms (e.g., wheelchair or bedside chair)?: A Little Help needed to walk in hospital room?: A Little Help needed climbing 3-5 steps with a railing? : A Lot 6 Click Score: 17    End of Session Equipment Utilized During Treatment: Gait belt Activity Tolerance: Patient tolerated treatment well Patient left: with call bell/phone within reach;with family/visitor present;in chair;with chair alarm set Nurse Communication: Mobility status PT Visit Diagnosis: Unsteadiness on feet (R26.81);Muscle weakness (generalized) (M62.81)     Time: 8566-8548 PT Time Calculation (min) (ACUTE ONLY): 18 min  Charges:    $Gait Training: 8-22 mins PT General Charges $$ ACUTE PT VISIT: 1 Visit                    Darryle George, PTA Acute Rehabilitation Services Secure Chat Preferred  [phone]    Darryle George 07/27/2024, 3:05 PM

## 2024-07-27 NOTE — Progress Notes (Signed)
 "                       PROGRESS NOTE Brief Summary: Debbie Cardenas is a 78 y.o.  female with history of presumed dementia (per family), A-fib, CAD-s/p CABG 2022-presented with falls and worsening mental status for 2 months. Overall workup has been unremarkable.  Neurology has been consulted to evaluate and has signed off with recommendation to add thiamine  supplement and follow up with neurology outpatient. Likely worsening dementia.   Consults: Neurology   Subjective:  Patient sitting up in bed, thoroughly confused, no headache.  Objective: Vitals: Blood pressure (!) 136/53, pulse 66, temperature 97.7 F (36.5 C), temperature source Axillary, resp. rate 17, height 5' 5 (1.651 m), weight 63 kg, SpO2 99%.   Exam:  Awake but thoroughly confused, no focal deficits [place].AT,PERRAL Supple Neck, No JVD,   Symmetrical Chest wall movement, Good air movement bilaterally, CTAB RRR,No Gallops, Rubs or new Murmurs,  +ve B.Sounds, Abd Soft, No tenderness,   No Cyanosis, Clubbing or edema   Assessment/Plan:  Acute encephalopathy suspected to be worsening baseline dementia with delirium- Suspected dementia worsening. Metabolic workup reasonably ruled out more common causes. TSH normal. Head, neck, spine imaging normal. EEG neg seizure. B12 elevated. RPR non reactive. Ammonia normal. Infectious evaluation has been negative.  Seen by neurology and cleared, history is suggestive of undiagnosed dementia with gradually declining mental status over several months at home.  Continue supportive care, minimize narcotics and benzodiazepines.  Continue supportive care with outpatient neurology follow-up postdischarge.  On thiamine , Remeron  and donepezil .  Hypertensive urgency BP better controlled but still fluctuating Continue metoprolol /amlodipine   ?  Cerebral venous sinus thrombosis Ruled out at this point-CT venogram negative-already on chronic anticoagulation with Eliquis ,  continue.  HLD Lipitor/Zetia   PAF Resume metoprolol  Telemetry monitoring On Eliquis   CAD s/p CABG- not endorsing Anginal symptoms Continue beta-blocker/statin  History of vitamin B12 deficiency Continue B12 supplementation B12 level stable.  Frequent falls Check orthostatics PT/OT eval  Code status:   Code Status: Full Code   DVT Prophylaxis: apixaban  (ELIQUIS ) tablet 5 mg    Family Communication: daughterat and husband bedside on 07/25/2024, daughter bedside 07/26/2024 son bedside 07/27/2024  Disposition Plan: Status is: Inpatient Remains inpatient appropriate because: placement pending   Planned Discharge Destination:Home health   Data Review:   Patient Lines/Drains/Airways Status     Active Line/Drains/Airways     Name Placement date Placement time Site Days   Peripheral IV 07/17/24 20 G Anterior;Right Forearm 07/17/24  2206  Forearm  10   External Urinary Catheter 07/18/24  1400  --  9             Inpatient Medications  Scheduled Meds:  apixaban   5 mg Oral BID   atorvastatin   10 mg Oral Daily   cholecalciferol   2,000 Units Oral Daily   donepezil   5 mg Oral QHS   ezetimibe   10 mg Oral Daily   feeding supplement  237 mL Oral BID BM   folic acid   1 mg Oral Daily   melatonin  5 mg Oral QHS   metoprolol  succinate  12.5 mg Oral Daily   mirtazapine   7.5 mg Oral QHS   multivitamin with minerals  1 tablet Oral Daily   QUEtiapine   25 mg Oral QHS   thiamine   100 mg Oral Daily   Continuous Infusions: PRN Meds:.acetaminophen  **OR** acetaminophen , albuterol , haloperidol  lactate, naphazoline-glycerin , ondansetron  **OR** ondansetron  (ZOFRAN ) IV  DVT Prophylaxis  apixaban  (ELIQUIS ) tablet 5 mg   Recent Labs  Lab 07/25/24 0505  WBC 7.3  HGB 11.8*  HCT 34.9*  PLT 270  MCV 91.1  MCH 30.8  MCHC 33.8  RDW 13.0  LYMPHSABS 1.6  MONOABS 0.6  EOSABS 0.2  BASOSABS 0.1    Recent Labs  Lab 07/25/24 0505  NA 142  K 3.5  CL 105  CO2 27   ANIONGAP 9  GLUCOSE 97  BUN 35*  CREATININE 0.74  MG 2.0  PHOS 3.3  CALCIUM  9.5      Recent Labs  Lab 07/25/24 0505  MG 2.0  CALCIUM  9.5    --------------------------------------------------------------------------------------------------------------- Lab Results  Component Value Date   CHOL 169 08/21/2023   HDL 65 08/21/2023   LDLCALC 89 08/21/2023   TRIG 80 08/21/2023   CHOLHDL 2.6 08/21/2023    Lab Results  Component Value Date   HGBA1C 4.8 07/18/2024   Radiology Reports  No results found.    Signature  -   Lavada Stank M.D on 07/27/2024 at 9:02 AM   -  To page go to [email or web address]      "

## 2024-07-27 NOTE — TOC Progression Note (Addendum)
 Transition of Care [facility]) - Progression Note    Patient Details  Name: Debbie Cardenas MRN: [id number] Date of Birth: 03/21/1946  [facility] CM/SW Contact  [name], LCSW Phone Number: 07/27/2024, 12:51 PM  Clinical Narrative:    12:51pm-CSW met with patient and spouse at bedside to discuss SNF. Spouse was able to tour [facility] on Friday and would like to choose [facility]. CSW confirming bed availability and will begin insurance authorization once therapy sees patient today.   3:12 PM-[facility] is able to accept patient pending insurance approval. CSW contacted [employer] to begin insurance process for [facility] and [facility].   Expected Discharge Plan: Skilled Nursing Facility Barriers to Discharge: Insurance Authorization               Expected Discharge Plan and Services In-house Referral: Clinical Social Work   Post Acute Care Choice: Skilled Nursing Facility Living arrangements for the past 2 months: Single Family Home                                       Social Drivers of Health (SDOH) Interventions SDOH Screenings   Food Insecurity: No Food Insecurity (07/18/2024)  Housing: Low Risk (07/18/2024)  Transportation Needs: No Transportation Needs (07/18/2024)  Utilities: Not At Risk (07/18/2024)  Financial Resource Strain: Low Risk (03/17/2024)   Received from [facility]  Physical Activity: Inactive (03/17/2024)   Received from [facility]  Social Connections: Moderately Isolated (07/18/2024)  Stress: No Stress Concern Present (03/17/2024)   [facility] from [facility]  Tobacco Use: Medium Risk (07/18/2024)    Readmission Risk Interventions     No data to display

## 2024-07-27 NOTE — Plan of Care (Signed)
" °  Problem: Education: Goal: Knowledge of General Education information will improve Description: Including pain rating scale, medication(s)/side effects and non-pharmacologic comfort measures Outcome: Progressing   Problem: Health Behavior/Discharge Planning: Goal: Ability to manage health-related needs will improve Outcome: Progressing   Problem: Clinical Measurements: Goal: Ability to maintain clinical measurements within normal limits will improve Outcome: Progressing Goal: Will remain free from infection Outcome: Progressing Goal: Diagnostic test results will improve Outcome: Progressing Goal: Respiratory complications will improve Outcome: Progressing Goal: Cardiovascular complication will be avoided Outcome: Progressing   Problem: Activity: Goal: Risk for activity intolerance will decrease Outcome: Progressing   Problem: Nutrition: Goal: Adequate nutrition will be maintained Outcome: Progressing   Problem: Coping: Goal: Level of anxiety will decrease Outcome: Progressing   Problem: Elimination: Goal: Will not experience complications related to bowel motility Outcome: Progressing Goal: Will not experience complications related to urinary retention Outcome: Progressing   Problem: Pain Managment: Goal: General experience of comfort will improve and/or be controlled Outcome: Progressing   Problem: Safety: Goal: Ability to remain free from injury will improve Outcome: Progressing   Problem: Skin Integrity: Goal: Risk for impaired skin integrity will decrease Outcome: Progressing   Problem: Education: Goal: Knowledge of disease or condition will improve Outcome: Progressing Goal: Knowledge of secondary prevention will improve (MUST DOCUMENT ALL) Outcome: Progressing Goal: Knowledge of patient specific risk factors will improve (DELETE if not current risk factor) Outcome: Progressing   Problem: Self-Care: Goal: Ability to participate in self-care as  condition permits will improve Outcome: Progressing Goal: Verbalization of feelings and concerns over difficulty with self-care will improve Outcome: Progressing Goal: Ability to communicate needs accurately will improve Outcome: Progressing   Problem: Nutrition: Goal: Risk of aspiration will decrease Outcome: Progressing Goal: Dietary intake will improve Outcome: Progressing   "

## 2024-07-28 DIAGNOSIS — R410 Disorientation, unspecified: Secondary | ICD-10-CM | POA: Diagnosis not present

## 2024-07-28 MED ORDER — FOLIC ACID 1 MG PO TABS: 1.0000 mg | ORAL_TABLET | Freq: Every day | ORAL | Status: AC

## 2024-07-28 MED ORDER — VITAMIN B-1 100 MG PO TABS: 100.0000 mg | ORAL_TABLET | Freq: Every day | ORAL | Status: AC

## 2024-07-28 MED ORDER — DONEPEZIL HCL 5 MG PO TABS: 5.0000 mg | ORAL_TABLET | Freq: Every day | ORAL | Status: AC

## 2024-07-28 NOTE — TOC Transition Note (Signed)
 Transition of Care [facility]) - Discharge Note   Patient Details  Name: Debbie Cardenas MRN: [id number] Date of Birth: 1945/12/08  [facility]) CM/SW Contact:  [name], LCSW Phone Number: 07/28/2024, 1:24 PM   Clinical Narrative:    Patient will DC to: [facility] Anticipated DC date: 07/28/24 Family notified: Spouse Transport by: [facility]   Per MD patient ready for DC to [facility]. RN to call report prior to discharge [phone] room 124). RN, patient, patient's family, and facility notified of DC. Discharge Summary and FL2 sent to facility. DC packet on chart. Ambulance transport requested for patient.   CSW will sign off for now as social work intervention is no longer needed. Please consult us  again if new needs arise.     Final next level of care: Skilled Nursing Facility Barriers to Discharge: Barriers Resolved   Patient Goals and CMS Choice Patient states their goals for this hospitalization and ongoing recovery are:: Rehab CMS Medicare.gov Compare Post Acute Care list provided to:: Patient Represenative (must comment) Choice offered to / list presented to : Spouse [place] ownership interest in [facility].provided to:: Spouse    Discharge Placement   Existing PASRR number confirmed : 07/28/24          Patient chooses bed at: [facility] Patient to be transferred to facility by: [facility] Name of family member notified: Spouse Patient and family notified of of transfer: 07/28/24  Discharge Plan and Services Additional resources added to the After Visit Summary for   In-house Referral: Clinical Social Work   Post Acute Care Choice: Skilled Nursing Facility                               Social Drivers of Health (SDOH) Interventions SDOH Screenings   Food Insecurity: No Food Insecurity (07/18/2024)  Housing: Low Risk (07/18/2024)  Transportation Needs: No Transportation Needs (07/18/2024)  Utilities: Not At Risk  (07/18/2024)  Financial Resource Strain: Low Risk (03/17/2024)   Received from [facility]  Physical Activity: Inactive (03/17/2024)   Received from [facility]  Social Connections: Moderately Isolated (07/18/2024)  Stress: No Stress Concern Present (03/17/2024)   Received from [facility]  Tobacco Use: Medium Risk (07/18/2024)     Readmission Risk Interventions     No data to display

## 2024-07-28 NOTE — Plan of Care (Signed)
" °  Problem: Education: Goal: Knowledge of General Education information will improve Description: Including pain rating scale, medication(s)/side effects and non-pharmacologic comfort measures Outcome: Completed/Met   Problem: Health Behavior/Discharge Planning: Goal: Ability to manage health-related needs will improve Outcome: Completed/Met   Problem: Clinical Measurements: Goal: Ability to maintain clinical measurements within normal limits will improve Outcome: Completed/Met Goal: Will remain free from infection Outcome: Completed/Met Goal: Diagnostic test results will improve Outcome: Completed/Met Goal: Respiratory complications will improve Outcome: Completed/Met Goal: Cardiovascular complication will be avoided Outcome: Completed/Met   Problem: Activity: Goal: Risk for activity intolerance will decrease Outcome: Completed/Met   Problem: Nutrition: Goal: Adequate nutrition will be maintained Outcome: Completed/Met   Problem: Coping: Goal: Level of anxiety will decrease Outcome: Completed/Met   Problem: Elimination: Goal: Will not experience complications related to bowel motility Outcome: Completed/Met Goal: Will not experience complications related to urinary retention Outcome: Completed/Met   Problem: Pain Managment: Goal: General experience of comfort will improve and/or be controlled Outcome: Completed/Met   Problem: Safety: Goal: Ability to remain free from injury will improve Outcome: Completed/Met   Problem: Skin Integrity: Goal: Risk for impaired skin integrity will decrease Outcome: Completed/Met   Problem: Education: Goal: Knowledge of disease or condition will improve Outcome: Completed/Met Goal: Knowledge of secondary prevention will improve (MUST DOCUMENT ALL) Outcome: Completed/Met Goal: Knowledge of patient specific risk factors will improve (DELETE if not current risk factor) Outcome: Completed/Met   Problem: Self-Care: Goal: Ability to  participate in self-care as condition permits will improve Outcome: Completed/Met Goal: Verbalization of feelings and concerns over difficulty with self-care will improve Outcome: Completed/Met Goal: Ability to communicate needs accurately will improve Outcome: Completed/Met   Problem: Nutrition: Goal: Risk of aspiration will decrease Outcome: Completed/Met Goal: Dietary intake will improve Outcome: Completed/Met   "

## 2024-07-28 NOTE — Progress Notes (Signed)
 Occupational Therapy Treatment Patient Details Name: Debbie Cardenas MRN: [id number] DOB: 29-Dec-1945 Today's Date: 07/28/2024   History of present illness Pt is a 78 y/o F who presented to [facility] ED for cognitive decline per husband. Pt admitted for workup of AMS. PMHx: recent cognitive decline without formal dx of dementia, anxiety, HLD, HTN, L femoral neck fx (2020), R TKA (2024).   OT comments  Pt making good progress with functional goals. Pt with multiple family members present upon OT arrival. Pt requires Sup to sit EOB, CGA STS and to use RW to walk to bathroom, min A with clothing mgt, Sup with anterior hygiene without LOB, cues required to use grab bars for safety. Pt stood at sink to wash and dry hands. Pt requires multimodal cues for initiation and sequencing of functional tasks and for safety. Pt very pleasant and cooperative. OT will continue to follow acutely to maximize level of function and safety      If plan is discharge home, recommend the following:  Assistance with cooking/housework;Assistance with feeding;Direct supervision/assist for medications management;Direct supervision/assist for financial management;Assist for transportation;Help with stairs or ramp for entrance;Supervision due to cognitive status;A little help with walking and/or transfers;A little help with bathing/dressing/bathroom   Equipment Recommendations  Other (comment) (defer)    Recommendations for Other Services      Precautions / Restrictions Precautions Precautions: Fall Recall of Precautions/Restrictions: Impaired Restrictions Weight Bearing Restrictions Per Provider Order: No       Mobility Bed Mobility Overal bed mobility: Needs Assistance Bed Mobility: Supine to Sit     Supine to sit: Supervision, HOB elevated Sit to supine: Supervision        Transfers Overall transfer level: Needs assistance Equipment used: Rolling walker (2 wheels)   Sit to Stand: Contact guard assist                  Balance Overall balance assessment: Needs assistance Sitting-balance support: Bilateral upper extremity supported, Feet supported Sitting balance-Leahy Scale: Good     Standing balance support: Bilateral upper extremity supported, During functional activity, Reliant on assistive device for balance Standing balance-Leahy Scale: Poor                             ADL either performed or assessed with clinical judgement   ADL Overall ADL's : Needs assistance/impaired     Grooming: Wash/dry hands;Wash/dry face;Minimal assistance;Standing;Cueing for sequencing;Cueing for safety Grooming Details (indicate cue type and reason): assist for initiation of task                 Toilet Transfer: +2 for safety/equipment;Cueing for safety;Cueing for sequencing;Rolling walker (2 wheels)   Toileting- Clothing Manipulation and Hygiene: Minimal assistance;Cueing for safety;Cueing for sequencing;Sit to/from stand       Functional mobility during ADLs: Minimal assistance;Rolling walker (2 wheels);Cueing for safety;Cueing for sequencing General ADL Comments: Pt requires Max multimodal cues for initiation and sequencing    Extremity/Trunk Assessment Upper Extremity Assessment Upper Extremity Assessment: Generalized weakness   Lower Extremity Assessment Lower Extremity Assessment: Defer to PT evaluation        Vision Ability to See in Adequate Light: 0 Adequate Patient Visual Report: No change from baseline     Perception     Praxis     Communication Communication Communication: No apparent difficulties   Cognition Arousal: Alert Behavior During Therapy: Impulsive, Restless Cognition: History of cognitive impairments, Cognition impaired  Memory impairment (select all impairments): Short-term memory, Working civil service fast streamer, Copywriter, advertising, Geneticist, Molecular long-term memory     OT - Cognition Comments: increased time for processing                  Following commands: Impaired Following commands impaired: Follows one step commands with increased time      Cueing   Cueing Techniques: Verbal cues, Tactile cues, Visual cues, Gestural cues  Exercises      Shoulder Instructions       General Comments      Pertinent Vitals/ Pain       Pain Assessment Pain Assessment: No/denies pain Pain Score: 0-No pain  Home Living                                          Prior Functioning/Environment              Frequency  Min 2X/week        Progress Toward Goals  OT Goals(current goals can now be found in the care plan section)  Progress towards OT goals: Progressing toward goals     Plan      Co-evaluation                 AM-PAC OT 6 Clicks Daily Activity     Outcome Measure   Help from another person eating meals?: A Little Help from another person taking care of personal grooming?: A Little Help from another person toileting, which includes using toliet, bedpan, or urinal?: A Little Help from another person bathing (including washing, rinsing, drying)?: A Lot Help from another person to put on and taking off regular upper body clothing?: A Lot Help from another person to put on and taking off regular lower body clothing?: A Lot 6 Click Score: 15    End of Session Equipment Utilized During Treatment: Gait belt;Rolling walker (2 wheels)  OT Visit Diagnosis: Unsteadiness on feet (R26.81);Muscle weakness (generalized) (M62.81);Other abnormalities of gait and mobility (R26.89);Other symptoms and signs involving cognitive function   Activity Tolerance Other (comment);Treatment limited secondary to agitation (cognition)   Patient Left in bed;with call bell/phone within reach;with bed alarm set;with family/visitor present   Nurse Communication Mobility status        Time: 8573-8554 OT Time Calculation (min): 19 min  Charges: OT General Charges $OT Visit: 1 Visit OT  Treatments $Self Care/Home Management : 8-22 mins    Jacques Karna Loose 07/28/2024, 3:00 PM

## 2024-07-28 NOTE — Discharge Summary (Signed)
 "                                                                                                                                                                               Discharge summary note.  Debbie Cardenas FMW:[id number] DOB: September 10, 1945 DOA: 07/17/2024  PCP: Juliane Che, PA  Admit date: 07/17/2024  Discharge date: 07/28/2024  Admitted From: Home   Disposition:  SNF   Recommendations for Outpatient Follow-up:   Follow up with PCP in 1-2 weeks  PCP Please obtain BMP/CBC, 2 view CXR in 1week,  (see Discharge instructions)   PCP Please follow up on the following pending results:    Home Health: None   Equipment/Devices: None  Consultations: Neurology Discharge Condition: Stable    CODE STATUS: Full    Diet Recommendation: Heart Healthy     Chief Complaint  Patient presents with   Fall     Brief history of present illness from the day of admission and additional interim summary    78 y.o.  female with history of presumed dementia (per family), A-fib, CAD-s/p CABG 2022-presented with falls and worsening mental status for 2 months. Overall workup has been unremarkable. Neurology has been consulted to evaluate and has signed off with recommendation to add thiamine  supplement and follow up with neurology outpatient. Likely worsening dementia.                                                                  Hospital Course   Acute encephalopathy suspected to be worsening baseline dementia with delirium- Suspected dementia worsening. Metabolic workup reasonably ruled out more common causes. TSH normal. Head, neck, spine imaging normal. EEG neg seizure. B12 elevated. RPR non reactive. Ammonia normal. Infectious evaluation has been negative.  Seen by neurology and cleared, history is suggestive of undiagnosed dementia with gradually declining mental status over several months at home.  Continue supportive care, minimize narcotics and benzodiazepines.  Continue supportive  care with outpatient neurology follow-up postdischarge.  On thiamine , Aricept  along with folic acid .  Outpatient neurology follow-up recommended in 1 to 2 weeks postdischarge.  Will require SNF.   Hypertensive urgency Resolved stable on low-dose beta-blocker   ?  Cerebral venous sinus thrombosis Ruled out at this point-CT venogram negative-already on chronic anticoagulation with Eliquis , continue.   HLD Lipitor/Zetia    PAF Low-dose beta-blocker and Eliquis    CAD s/p CABG- not endorsing Anginal symptoms Continue beta-blocker/statin   History of vitamin B12 deficiency  Continue B12 supplementation B12 level stable.      Discharge diagnosis     Principal Problem:   Change in mental status    Discharge instructions    Discharge Instructions     Discharge instructions   Complete by: As directed    Follow with Primary MD Juliane Che, PA in 7 days   Get CBC, CMP, Magnesium , 2 view Chest X ray -  checked next visit with your primary MD or SNF MD    Activity: As tolerated with Full fall precautions use walker/cane & assistance as needed  Disposition Home   Diet: Heart Healthy  with feeding assistance and aspiration precautions.  Special Instructions: If you have smoked or chewed Tobacco  in the last 2 yrs please stop smoking, stop any regular Alcohol  and or any Recreational drug use.  On your next visit with your primary care physician please Get Medicines reviewed and adjusted.  Please request your Prim.MD to go over all Hospital Tests and Procedure/Radiological results at the follow up, please get all Hospital records sent to your Prim MD by signing hospital release before you go home.  If you experience worsening of your admission symptoms, develop shortness of breath, life threatening emergency, suicidal or homicidal thoughts you must seek medical attention immediately by calling 911 or calling your MD immediately  if symptoms less severe.  You Must read complete  instructions/literature along with all the possible adverse reactions/side effects for all the Medicines you take and that have been prescribed to you. Take any new Medicines after you have completely understood and accpet all the possible adverse reactions/side effects.   Do not drive when taking Pain medications.  Do not take more than prescribed Pain, Sleep and Anxiety Medications  Wear Seat belts while driving.   Increase activity slowly   Complete by: As directed        Discharge Medications   Allergies as of 07/28/2024       Reactions   Tramadol  Itching   Pt itches the day after taking but can tolerate it   Ephedrine     My dentist told me to never let anyone give me this   Ace Inhibitors Cough        Medication List     STOP taking these medications    amLODipine  5 MG tablet Commonly known as: NORVASC        TAKE these medications    acetaminophen  325 MG tablet Commonly known as: TYLENOL  Take 2 tablets (650 mg total) by mouth every 6 (six) hours as needed.   apixaban  5 MG Tabs tablet Commonly known as: Eliquis  Take 1 tablet (5 mg total) by mouth 2 (two) times daily.   atorvastatin  10 MG tablet Commonly known as: LIPITOR TAKE 1 TABLET BY MOUTH EVERY DAY   cyanocobalamin  1000 MCG tablet Take 1 tablet (1,000 mcg total) by mouth daily.   donepezil  5 MG tablet Commonly known as: ARICEPT  Take 1 tablet (5 mg total) by mouth at bedtime.   ezetimibe  10 MG tablet Commonly known as: ZETIA  Take 1 tablet (10 mg total) by mouth daily. Pt must schedule a followup appt with Cardiology in January 2026 for any more refills. [phone] Thank You   folic acid  1 MG tablet Commonly known as: FOLVITE  Take 1 tablet (1 mg total) by mouth daily. Start taking on: July 29, 2024   metoprolol  succinate 25 MG 24 hr tablet Commonly known as: TOPROL -XL TAKE 0.5 TABLETS (12.5 MG TOTAL) BY MOUTH DAILY.  TAKE BETWEEN LUNCH AND DINNER.   PREVAGEN PO Take 1 capsule by  mouth daily.   thiamine  100 MG tablet Commonly known as: Vitamin B-1 Take 1 tablet (100 mg total) by mouth daily. Start taking on: July 29, 2024   Vitamin D  50 MCG (2000 UT) tablet Take 2,000 Units by mouth daily.         Contact information for follow-up providers     Juliane Che, [place]. Schedule an appointment as soon as possible for a visit in 1 week(s).   Specialty: Family Medicine Contact information: [address] [place] [address] [phone]         [facility]. Schedule an appointment as soon as possible for a visit in 1 week(s).   Contact information: [address]     [address] [place]  [address] [phone]             Contact information for after-discharge care     Destination     [facility] [place], [place] .   Service: Skilled Nursing Contact information: [address] [place]   [address] [phone]                     Major procedures and Radiology Reports - PLEASE review detailed and final reports thoroughly  -      CT VENOGRAM HEAD Result Date: 07/20/2024 EXAM: CT VENOGRAM WITH CONTRAST 07/20/2024 10:20:29 AM TECHNIQUE: CT venogram of the head/brain was performed with the administration of intravenous contrast (iohexol  (OMNIPAQUE ) 350 MG/ML injection 75 mL IOHEXOL  350 MG/ML SOLN). Multiplanar reformatted images are provided for review. MIP images are provided for review. Automated exposure control, iterative reconstruction, and/or weight based adjustment of the mA/kV was utilized to reduce the radiation dose to as low as reasonably achievable. COMPARISON: MRI brain 07/18/2024 and CT head 07/17/2024. CLINICAL HISTORY: Dural venous sinus thrombosis suspected. FINDINGS: BRAIN/VENTRICLES: No acute intracranial hemorrhage. No extra axial fluid collection. Gray-white differentiation is maintained. No mass effect or midline shift. No hydrocephalus.  There is overall unchanged mild scattered white matter hypodensities which are nonspecific but most commonly represent chronic microvascular ischemic changes. ORBITS: Orbits demonstrate bilateral lens replacement. SINUSES AND MASTOIDS: No acute abnormality. SOFT TISSUES AND SKULL: No acute abnormality. CT VENOGRAM: No evidence of dural venous sinus or cerebral vein thrombosis. No significant stenosis. IMPRESSION: 1. No evidence of dural venous sinus or cerebral vein thrombosis. 2. No substantial change since 07/17/2024. Electronically signed by: Prentice Spade MD 07/20/2024 11:53 AM EST RP Workstation: [id number]   EEG adult Result Date: 07/20/2024 Matthews Elida HERO, MD     07/20/2024  7:18 AM Routine EEG Report EVETT KASSA is a 78 y.o. female with a history of altered mental status who is undergoing an EEG to evaluate for seizures. Report: This EEG was acquired with electrodes placed according to the International 10-20 electrode system (including Fp1, Fp2, F3, F4, C3, C4, P3, P4, O1, O2, T3, T4, T5, T6, A1, A2, Fz, Cz, Pz). The following electrodes were missing or displaced: none. The occipital dominant rhythm was 6 Hz. This activity is reactive to stimulation. Drowsiness was manifested by background fragmentation; deeper stages of sleep were identified by K complexes and sleep spindles. There was no focal slowing. There were occasional triphasic waves. There were no interictal epileptiform discharges. There were no electrographic seizures identified. There was no abnormal response to photic stimulation or hyperventilation. Impression and clinical correlation: This EEG was obtained while awake and  asleep and is abnormal due to moderate diffuse slowing indicative of global cerebral dysfunction. Triphasic waves are indicative of metabolic encephalopathy. Epileptiform abnormalities were not seen during this recording. Elida Ross, MD [facility] [phone] If 7pm- 7am, please page neurology on  call as listed in AMION.   MR MRV HEAD WO CM Result Date: 07/19/2024 CLINICAL DATA:  Initial evaluation for possible dural venous sinus thrombosis, question on prior MRI. EXAM: MR VENOGRAM HEAD WITHOUT CONTRAST TECHNIQUE: Angiographic images of the intracranial venous structures were acquired using MRV technique without intravenous contrast. COMPARISON:  Prior MRI from 07/18/2024. FINDINGS: Examination is technically limited and nondiagnostic as the patient was unable to tolerate the exam. Intermittent scalp images only were obtained. No visible signal changes of dural venous sinus thrombosis on this limited exam. IMPRESSION: Technically limited truncated exam due to the patient's inability to tolerate the study. Scout imaging only obtained. No visible signal changes of dural venous sinus thrombosis on this limited exam. If there remains clinical suspicion for possible occult dural venous sinus thrombosis, consideration of follow-up CT venogram could be performed for further evaluation as warranted and as the patient can tolerate. Electronically Signed   By: Morene Hoard M.D.   On: 07/19/2024 18:24   MR BRAIN WO CONTRAST Result Date: 07/18/2024 CLINICAL DATA:  Initial evaluation for acute mental status change, unknown cause. EXAM: MRI HEAD WITHOUT CONTRAST TECHNIQUE: Multiplanar, multiecho pulse sequences of the brain and surrounding structures were obtained without intravenous contrast. COMPARISON:  CT from 07/17/2024. FINDINGS: Brain: Examination mildly degraded by motion artifact. Cerebral volume within normal limits. Patchy T2/FLAIR hyperintensity involving the periventricular and deep white matter both cerebral hemispheres, most characteristic of chronic microvascular ischemic disease, mild for age. No evidence for acute or subacute infarct. No areas of chronic cortical infarction. No acute or chronic intracranial blood products. No mass lesion, midline shift or mass effect. No hydrocephalus or  extra-axial fluid collection. Pituitary gland within normal limits. Vascular: Major intracranial arterial vascular flow voids are maintained. Intermittent FLAIR signal abnormality noted about the posterior aspect of the superior sagittal sinus and transverse at the sigmoid sinuses bilaterally (series 9, image 6) for example. No signal changes seen on corresponding sequences. Overall appearance is favored to be artifactual in nature. Skull and upper cervical spine: Craniocervical junction within normal limits. Bone marrow signal intensity normal. No scalp soft tissue abnormality. Sinuses/Orbits: Prior bilateral ocular lens replacement. Paranasal sinuses are largely clear. No significant mastoid effusion. Other: None. IMPRESSION: 1. Apparent FLAIR signal abnormality involving the transverse and sigmoid sinuses bilaterally. While this finding is favored to be artifactual in nature on this somewhat motion degraded exam, possible dural venous sinus thrombosis could also potentially have this appearance, and could be considered in the correct clinical setting. Correlation with dedicated MRV suggested as clinically warranted. 2. No other acute intracranial abnormality. 3. Mild chronic microvascular ischemic disease for age. Electronically Signed   By: Morene Hoard M.D.   On: 07/18/2024 23:12   CT RENAL STONE STUDY Result Date: 07/18/2024 CLINICAL DATA:  Hematuria. EXAM: CT ABDOMEN AND PELVIS WITHOUT CONTRAST TECHNIQUE: Multidetector CT imaging of the abdomen and pelvis was performed following the standard protocol without IV contrast. RADIATION DOSE REDUCTION: This exam was performed according to the departmental dose-optimization program which includes automated exposure control, adjustment of the mA and/or kV according to patient size and/or use of iterative reconstruction technique. COMPARISON:  None Available. FINDINGS: Evaluation of this exam is limited in the absence of intravenous contrast. Evaluation is  also limited due to respiratory motion and streak artifact caused by patient's arms. Lower chest: The visualized lung bases are clear. There is coronary vascular calcification. No intra-abdominal free air or free fluid. Hepatobiliary: The liver is unremarkable. No biliary dilatation. Cholecystectomy. Pancreas: Unremarkable. No pancreatic ductal dilatation or surrounding inflammatory changes. Spleen: Normal in size without focal abnormality. Adrenals/Urinary Tract: The adrenal glands unremarkable. There is a 9 mm nonobstructing left renal inferior pole calculus. No hydronephrosis. There is no hydronephrosis or nephrolithiasis on the right. The visualized ureters and urinary bladder appear unremarkable. Stomach/Bowel: There is moderate stool throughout the colon. There is no bowel obstruction or active inflammation. Appendectomy. Vascular/Lymphatic: Moderate aortoiliac atherosclerotic disease. The IVC is unremarkable. No gross gas. There is no adenopathy. Reproductive: Small calcified uterine fibroid. No suspicious adnexal masses. Other: Small fat containing right inguinal hernia. Musculoskeletal: Osteopenia with degenerative changes of the spine. Total left hip arthroplasty. No acute osseous pathology. IMPRESSION: 1. A 9 mm nonobstructing left renal inferior pole calculus. No hydronephrosis. 2. Moderate colonic stool burden. No bowel obstruction. 3.  Aortic Atherosclerosis (ICD10-I70.0). Electronically Signed   By: Vanetta Chou M.D.   On: 07/18/2024 12:47   DG Chest Portable 1 View Result Date: 07/17/2024 EXAM: 1 VIEW(S) XRAY OF THE CHEST 07/17/2024 07:55:00 PM COMPARISON: 10/09/2022. CLINICAL HISTORY: falls, AMS FINDINGS: LUNGS AND PLEURA: No focal pulmonary opacity. No pleural effusion. No pneumothorax. HEART AND MEDIASTINUM: Surgical changes in mediastinum. Aortic calcification. BONES AND SOFT TISSUES: Intact sternotomy wires. No acute osseous abnormality. IMPRESSION: 1. No acute process. Electronically  signed by: Norman Gatlin MD 07/17/2024 07:59 PM EST RP Workstation: [id number]   CT Thoracic Spine Wo Contrast Result Date: 07/17/2024 EXAM: CT THORACIC SPINE WITHOUT CONTRAST 07/17/2024 06:57:00 PM TECHNIQUE: CT of the thoracic spine was performed without the administration of intravenous contrast. Multiplanar reformatted images are provided for review. Automated exposure control, iterative reconstruction, and/or weight based adjustment of the mA/kV was utilized to reduce the radiation dose to as low as reasonably achievable. COMPARISON: None available. CLINICAL HISTORY: Ataxia, thoracic trauma FINDINGS: BONES AND ALIGNMENT: Normal vertebral body heights. Remote superior endplate fracture is present at T12. No acute fracture or suspicious bone lesion. Normal alignment. DEGENERATIVE CHANGES: No significant degenerative changes. SOFT TISSUES: No acute abnormality. VASCULATURE: Atherosclerotic changes are present in the aorta and branch vessels. No aneurysm is present. IMPRESSION: 1. No acute abnormality of the thoracic spine. 2. Remote superior endplate fracture at T12. Electronically signed by: Lonni Necessary MD 07/17/2024 07:38 PM EST RP Workstation: [id number]   CT Cervical Spine Wo Contrast Result Date: 07/17/2024 EXAM: CT CERVICAL SPINE WITHOUT CONTRAST 07/17/2024 06:57:00 PM TECHNIQUE: CT of the cervical spine was performed without the administration of intravenous contrast. Multiplanar reformatted images are provided for review. Automated exposure control, iterative reconstruction, and/or weight based adjustment of the mA/kV was utilized to reduce the radiation dose to as low as reasonably achievable. COMPARISON: None available. CLINICAL HISTORY: Neck trauma (Age >= 65y) FINDINGS: BONES AND ALIGNMENT: No acute fracture or traumatic malalignment. DEGENERATIVE CHANGES: Grade 1 degenerative anterolisthesis is present at C3-C4 and C4-C5. Facet hypertrophy contributes to foraminal narrowing  bilaterally at C3-C4 and C4-C5. Uncovertebral spurring contributes to foraminal narrowing bilaterally at C5-C6 and C6-C7. SOFT TISSUES: No prevertebral soft tissue swelling. IMPRESSION: 1. No evidence of acute traumatic injury. Electronically signed by: Lonni Necessary MD 07/17/2024 07:31 PM EST RP Workstation: [id number]   CT Lumbar Spine Wo Contrast Result Date: 07/17/2024 EXAM: CT OF THE LUMBAR SPINE WITHOUT CONTRAST 07/17/2024 06:57:00 PM  TECHNIQUE: CT of the lumbar spine was performed without the administration of intravenous contrast. Multiplanar reformatted images are provided for review. Automated exposure control, iterative reconstruction, and/or weight based adjustment of the mA/kV was utilized to reduce the radiation dose to as low as reasonably achievable. COMPARISON: None available. CLINICAL HISTORY: Back trauma, no prior imaging (Age >= 16y). Unwitnessed fall. FINDINGS: BONES AND ALIGNMENT: Normal vertebral body heights. A superior endplate fracture at T12 appears remote. No acute fracture or suspicious bone lesion. Normal alignment. DEGENERATIVE CHANGES: Moderate central canal stenosis is present at C2-C3, C3-C4, and C4-C5 secondary to broad-based disc protrusions and ligamentum flavum thickening. Moderate foraminal narrowing is present on the left at L4-L5. SOFT TISSUES: Atherosclerotic calcifications are present in the aorta and branch vessels without aneurysm. No acute abnormality. IMPRESSION: 1. No acute findings. 2. Remote superior endplate fracture at T12. Electronically signed by: Lonni Necessary MD 07/17/2024 07:26 PM EST RP Workstation: [id number]   CT Head Wo Contrast Result Date: 07/17/2024 EXAM: CT HEAD WITHOUT CONTRAST 07/17/2024 06:57:00 PM TECHNIQUE: CT of the head was performed without the administration of intravenous contrast. Automated exposure control, iterative reconstruction, and/or weight based adjustment of the mA/kV was utilized to reduce the radiation dose to  as low as reasonably achievable. COMPARISON: None available. CLINICAL HISTORY: Head trauma, minor (Age >= 65y). Unwitnessed fall. FINDINGS: BRAIN AND VENTRICLES: No acute hemorrhage. No evidence of acute infarct. No hydrocephalus. No extra-axial collection. No mass effect or midline shift. Atherosclerotic calcifications within the cavernous internal carotid arteries. ORBITS: Bilateral lens replacement. No acute abnormality. SINUSES: No acute abnormality. SOFT TISSUES AND SKULL: No acute soft tissue abnormality. No skull fracture. IMPRESSION: 1. No acute intracranial abnormality. Electronically signed by: Lonni Necessary MD 07/17/2024 07:24 PM EST RP Workstation: [id number]    Micro Results    No results found for this or any previous visit (from the past 240 hours).  Today   Subjective    Debbie Cardenas today remains in bed confused.   Objective   Blood pressure (!) 110/47, pulse 60, temperature 97.6 F (36.4 C), temperature source Axillary, resp. rate 18, height 5' 5 (1.651 m), weight 63 kg, SpO2 99%.  No intake or output data in the 24 hours ending 07/28/24 1028  Exam  Sleeping, in no distress, No new F.N deficits,   moves all 4 extremities by herself. [place].AT,PERRAL Supple Neck,   Symmetrical Chest wall movement, Good air movement bilaterally, CTAB RRR,No Gallops,   +ve B.Sounds, Abd Soft, Non tender,  No Cyanosis, Clubbing or edema    Data Review   Recent Labs  Lab 07/25/24 0505  WBC 7.3  HGB 11.8*  HCT 34.9*  PLT 270  MCV 91.1  MCH 30.8  MCHC 33.8  RDW 13.0  LYMPHSABS 1.6  MONOABS 0.6  EOSABS 0.2  BASOSABS 0.1    Recent Labs  Lab 07/25/24 0505  NA 142  K 3.5  CL 105  CO2 27  ANIONGAP 9  GLUCOSE 97  BUN 35*  CREATININE 0.74  MG 2.0  PHOS 3.3  CALCIUM  9.5    Total Time in preparing paper work, data evaluation and todays exam - 35 minutes  Signature  -    Lavada Stank M.D on 07/28/2024 at 10:28 AM   -  To page go to [email or web address]      "

## 2024-07-28 NOTE — Plan of Care (Signed)
" °  Problem: Education: Goal: Knowledge of General Education information will improve Description: Including pain rating scale, medication(s)/side effects and non-pharmacologic comfort measures Outcome: Progressing   Problem: Health Behavior/Discharge Planning: Goal: Ability to manage health-related needs will improve Outcome: Progressing   Problem: Clinical Measurements: Goal: Ability to maintain clinical measurements within normal limits will improve Outcome: Progressing Goal: Will remain free from infection Outcome: Progressing Goal: Diagnostic test results will improve Outcome: Progressing Goal: Respiratory complications will improve Outcome: Progressing Goal: Cardiovascular complication will be avoided Outcome: Progressing   Problem: Activity: Goal: Risk for activity intolerance will decrease Outcome: Progressing   Problem: Nutrition: Goal: Adequate nutrition will be maintained Outcome: Progressing   Problem: Coping: Goal: Level of anxiety will decrease Outcome: Progressing   Problem: Elimination: Goal: Will not experience complications related to bowel motility Outcome: Progressing Goal: Will not experience complications related to urinary retention Outcome: Progressing   Problem: Pain Managment: Goal: General experience of comfort will improve and/or be controlled Outcome: Progressing   Problem: Safety: Goal: Ability to remain free from injury will improve Outcome: Progressing   Problem: Skin Integrity: Goal: Risk for impaired skin integrity will decrease Outcome: Progressing   Problem: Education: Goal: Knowledge of disease or condition will improve Outcome: Progressing Goal: Knowledge of secondary prevention will improve (MUST DOCUMENT ALL) Outcome: Progressing Goal: Knowledge of patient specific risk factors will improve (DELETE if not current risk factor) Outcome: Progressing   Problem: Self-Care: Goal: Ability to participate in self-care as  condition permits will improve Outcome: Progressing Goal: Verbalization of feelings and concerns over difficulty with self-care will improve Outcome: Progressing Goal: Ability to communicate needs accurately will improve Outcome: Progressing   Problem: Nutrition: Goal: Risk of aspiration will decrease Outcome: Progressing Goal: Dietary intake will improve Outcome: Progressing   "

## 2024-07-28 NOTE — TOC Progression Note (Addendum)
 Transition of Care [facility]) - Progression Note    Patient Details  Name: TORI DATTILIO MRN: [id number] Date of Birth: [birth year]  [facility] CM/SW Contact  [name], LCSW Phone Number: 07/28/2024, 8:51 AM  Clinical Narrative:    8:51 AM-Awaiting insurance approval for [facility].   10:12 AM-CSW received insurance approval for [facility] # [id number], effective 7 days. PTAR authorization approved, Ref# [id number].  CSW updated patient's spouse who requested [facility] for transport. [facility] requests transport be set for 2pm.    Expected Discharge Plan: Skilled Nursing Facility Barriers to Discharge: Barriers Resolved               Expected Discharge Plan and Services In-house Referral: Clinical Social Work   Post Acute Care Choice: Skilled Nursing Facility Living arrangements for the past 2 months: Single Family Home                                       Social Drivers of Health (SDOH) Interventions SDOH Screenings   Food Insecurity: No Food Insecurity (07/18/2024)  Housing: Low Risk (07/18/2024)  Transportation Needs: No Transportation Needs (07/18/2024)  Utilities: Not At Risk (07/18/2024)  Financial Resource Strain: Low Risk (03/17/2024)   Received from [facility]  Physical Activity: Inactive (03/17/2024)   Received from [facility]  Social Connections: Moderately Isolated (07/18/2024)  Stress: No Stress Concern Present (03/17/2024)   Received from [facility]  Tobacco Use: Medium Risk (07/18/2024)    Readmission Risk Interventions     No data to display

## 2024-08-06 ENCOUNTER — Encounter: Payer: Self-pay | Admitting: Cardiology

## 2024-08-19 ENCOUNTER — Other Ambulatory Visit: Payer: Self-pay | Admitting: Cardiology
